# Patient Record
Sex: Female | Born: 1975 | Race: White | Hispanic: No | Marital: Married | Smoking: Former smoker
Health system: Southern US, Community
[De-identification: ages and names within clinical notes are randomized; demographics above are authoritative.]

## PROBLEM LIST (undated history)

## (undated) ENCOUNTER — Emergency Department (HOSPITAL_COMMUNITY): Payer: Medicare Other

## (undated) DIAGNOSIS — I951 Orthostatic hypotension: Secondary | ICD-10-CM

## (undated) DIAGNOSIS — G8929 Other chronic pain: Secondary | ICD-10-CM

## (undated) DIAGNOSIS — G90A Postural orthostatic tachycardia syndrome (POTS): Secondary | ICD-10-CM

## (undated) DIAGNOSIS — K589 Irritable bowel syndrome without diarrhea: Secondary | ICD-10-CM

## (undated) DIAGNOSIS — F419 Anxiety disorder, unspecified: Secondary | ICD-10-CM

## (undated) DIAGNOSIS — IMO0002 Reserved for concepts with insufficient information to code with codable children: Secondary | ICD-10-CM

## (undated) DIAGNOSIS — K219 Gastro-esophageal reflux disease without esophagitis: Secondary | ICD-10-CM

## (undated) DIAGNOSIS — R55 Syncope and collapse: Secondary | ICD-10-CM

## (undated) DIAGNOSIS — Z86010 Personal history of colonic polyps: Secondary | ICD-10-CM

## (undated) DIAGNOSIS — M797 Fibromyalgia: Secondary | ICD-10-CM

## (undated) DIAGNOSIS — K222 Esophageal obstruction: Secondary | ICD-10-CM

## (undated) DIAGNOSIS — G950 Syringomyelia and syringobulbia: Secondary | ICD-10-CM

## (undated) DIAGNOSIS — K648 Other hemorrhoids: Secondary | ICD-10-CM

## (undated) DIAGNOSIS — R000 Tachycardia, unspecified: Secondary | ICD-10-CM

## (undated) DIAGNOSIS — F32A Depression, unspecified: Secondary | ICD-10-CM

## (undated) DIAGNOSIS — R002 Palpitations: Secondary | ICD-10-CM

## (undated) DIAGNOSIS — F329 Major depressive disorder, single episode, unspecified: Secondary | ICD-10-CM

## (undated) DIAGNOSIS — K449 Diaphragmatic hernia without obstruction or gangrene: Secondary | ICD-10-CM

## (undated) DIAGNOSIS — I498 Other specified cardiac arrhythmias: Secondary | ICD-10-CM

## (undated) HISTORY — DX: Depression, unspecified: F32.A

## (undated) HISTORY — DX: Palpitations: R00.2

## (undated) HISTORY — DX: Fibromyalgia: M79.7

## (undated) HISTORY — DX: Diaphragmatic hernia without obstruction or gangrene: K44.9

## (undated) HISTORY — DX: Syringomyelia and syringobulbia: G95.0

## (undated) HISTORY — DX: Gastro-esophageal reflux disease without esophagitis: K21.9

## (undated) HISTORY — DX: Reserved for concepts with insufficient information to code with codable children: IMO0002

## (undated) HISTORY — DX: Major depressive disorder, single episode, unspecified: F32.9

## (undated) HISTORY — DX: Esophageal obstruction: K22.2

## (undated) HISTORY — PX: TONSILLECTOMY: SUR1361

## (undated) HISTORY — DX: Anxiety disorder, unspecified: F41.9

## (undated) HISTORY — DX: Syncope and collapse: R55

## (undated) HISTORY — PX: PARTIAL HYSTERECTOMY: SHX80

## (undated) HISTORY — PX: OTHER SURGICAL HISTORY: SHX169

## (undated) HISTORY — DX: Other chronic pain: G89.29

## (undated) HISTORY — DX: Personal history of colonic polyps: Z86.010

## (undated) HISTORY — DX: Other hemorrhoids: K64.8

## (undated) HISTORY — DX: Irritable bowel syndrome, unspecified: K58.9

---

## 1999-08-01 ENCOUNTER — Ambulatory Visit (HOSPITAL_COMMUNITY): Admission: RE | Admit: 1999-08-01 | Discharge: 1999-08-01 | Payer: Self-pay | Admitting: Family Medicine

## 1999-08-01 ENCOUNTER — Encounter: Payer: Self-pay | Admitting: Family Medicine

## 1999-08-20 ENCOUNTER — Other Ambulatory Visit: Admission: RE | Admit: 1999-08-20 | Discharge: 1999-08-20 | Payer: Self-pay | Admitting: Family Medicine

## 1999-10-06 ENCOUNTER — Ambulatory Visit (HOSPITAL_COMMUNITY): Admission: RE | Admit: 1999-10-06 | Discharge: 1999-10-06 | Payer: Self-pay | Admitting: Family Medicine

## 1999-10-06 ENCOUNTER — Encounter: Payer: Self-pay | Admitting: Family Medicine

## 1999-10-24 ENCOUNTER — Encounter: Admission: RE | Admit: 1999-10-24 | Discharge: 2000-01-22 | Payer: Self-pay | Admitting: Family Medicine

## 2000-01-01 ENCOUNTER — Ambulatory Visit (HOSPITAL_COMMUNITY): Admission: AD | Admit: 2000-01-01 | Discharge: 2000-01-01 | Payer: Self-pay | Admitting: Family Medicine

## 2000-01-01 ENCOUNTER — Encounter: Payer: Self-pay | Admitting: Family Medicine

## 2000-02-26 ENCOUNTER — Inpatient Hospital Stay (HOSPITAL_COMMUNITY): Admission: AD | Admit: 2000-02-26 | Discharge: 2000-02-28 | Payer: Self-pay | Admitting: *Deleted

## 2000-02-26 ENCOUNTER — Encounter (INDEPENDENT_AMBULATORY_CARE_PROVIDER_SITE_OTHER): Payer: Self-pay

## 2000-04-02 ENCOUNTER — Encounter: Payer: Self-pay | Admitting: Family Medicine

## 2000-04-02 ENCOUNTER — Inpatient Hospital Stay (HOSPITAL_COMMUNITY): Admission: RE | Admit: 2000-04-02 | Discharge: 2000-04-04 | Payer: Self-pay | Admitting: Family Medicine

## 2000-04-06 ENCOUNTER — Ambulatory Visit (HOSPITAL_COMMUNITY): Admission: RE | Admit: 2000-04-06 | Discharge: 2000-04-06 | Payer: Self-pay | Admitting: Family Medicine

## 2000-04-06 ENCOUNTER — Encounter: Payer: Self-pay | Admitting: Family Medicine

## 2000-10-04 ENCOUNTER — Other Ambulatory Visit: Admission: RE | Admit: 2000-10-04 | Discharge: 2000-10-04 | Payer: Self-pay | Admitting: Gastroenterology

## 2000-10-04 ENCOUNTER — Encounter (INDEPENDENT_AMBULATORY_CARE_PROVIDER_SITE_OTHER): Payer: Self-pay

## 2000-10-04 DIAGNOSIS — Z860100 Personal history of colon polyps, unspecified: Secondary | ICD-10-CM

## 2000-10-04 HISTORY — DX: Personal history of colon polyps, unspecified: Z86.0100

## 2000-10-04 HISTORY — DX: Personal history of colonic polyps: Z86.010

## 2000-10-06 ENCOUNTER — Other Ambulatory Visit: Admission: RE | Admit: 2000-10-06 | Discharge: 2000-10-06 | Payer: Self-pay | Admitting: Gastroenterology

## 2000-10-06 ENCOUNTER — Encounter (INDEPENDENT_AMBULATORY_CARE_PROVIDER_SITE_OTHER): Payer: Self-pay | Admitting: Specialist

## 2001-05-17 ENCOUNTER — Encounter: Payer: Self-pay | Admitting: Family Medicine

## 2001-05-17 ENCOUNTER — Ambulatory Visit (HOSPITAL_COMMUNITY): Admission: RE | Admit: 2001-05-17 | Discharge: 2001-05-17 | Payer: Self-pay | Admitting: Family Medicine

## 2002-04-21 ENCOUNTER — Other Ambulatory Visit: Admission: RE | Admit: 2002-04-21 | Discharge: 2002-04-21 | Payer: Self-pay | Admitting: Obstetrics & Gynecology

## 2002-06-13 ENCOUNTER — Inpatient Hospital Stay (HOSPITAL_COMMUNITY): Admission: RE | Admit: 2002-06-13 | Discharge: 2002-06-16 | Payer: Self-pay | Admitting: Obstetrics & Gynecology

## 2003-01-20 ENCOUNTER — Emergency Department (HOSPITAL_COMMUNITY): Admission: EM | Admit: 2003-01-20 | Discharge: 2003-01-20 | Payer: Self-pay | Admitting: Emergency Medicine

## 2003-01-31 ENCOUNTER — Ambulatory Visit (HOSPITAL_COMMUNITY): Admission: RE | Admit: 2003-01-31 | Discharge: 2003-01-31 | Payer: Self-pay | Admitting: Obstetrics & Gynecology

## 2003-01-31 ENCOUNTER — Encounter: Payer: Self-pay | Admitting: Obstetrics & Gynecology

## 2003-02-15 ENCOUNTER — Emergency Department (HOSPITAL_COMMUNITY): Admission: EM | Admit: 2003-02-15 | Discharge: 2003-02-15 | Payer: Self-pay | Admitting: Emergency Medicine

## 2003-03-02 ENCOUNTER — Ambulatory Visit (HOSPITAL_COMMUNITY): Admission: RE | Admit: 2003-03-02 | Discharge: 2003-03-02 | Payer: Self-pay | Admitting: Family Medicine

## 2003-03-02 ENCOUNTER — Encounter: Payer: Self-pay | Admitting: Family Medicine

## 2003-11-13 ENCOUNTER — Ambulatory Visit (HOSPITAL_COMMUNITY): Admission: RE | Admit: 2003-11-13 | Discharge: 2003-11-13 | Payer: Self-pay | Admitting: Obstetrics & Gynecology

## 2004-10-16 ENCOUNTER — Ambulatory Visit (HOSPITAL_COMMUNITY): Admission: RE | Admit: 2004-10-16 | Discharge: 2004-10-16 | Payer: Self-pay | Admitting: Internal Medicine

## 2004-10-23 ENCOUNTER — Ambulatory Visit (HOSPITAL_COMMUNITY): Admission: RE | Admit: 2004-10-23 | Discharge: 2004-10-23 | Payer: Self-pay | Admitting: Family Medicine

## 2004-10-31 ENCOUNTER — Ambulatory Visit (HOSPITAL_COMMUNITY): Admission: RE | Admit: 2004-10-31 | Discharge: 2004-10-31 | Payer: Self-pay | Admitting: Neurology

## 2005-01-01 ENCOUNTER — Ambulatory Visit (HOSPITAL_COMMUNITY): Admission: RE | Admit: 2005-01-01 | Discharge: 2005-01-01 | Payer: Self-pay | Admitting: *Deleted

## 2005-01-30 ENCOUNTER — Ambulatory Visit (HOSPITAL_COMMUNITY): Admission: RE | Admit: 2005-01-30 | Discharge: 2005-01-30 | Payer: Self-pay | Admitting: Neurology

## 2006-02-12 ENCOUNTER — Emergency Department (HOSPITAL_COMMUNITY): Admission: EM | Admit: 2006-02-12 | Discharge: 2006-02-12 | Payer: Self-pay | Admitting: Emergency Medicine

## 2006-04-07 ENCOUNTER — Ambulatory Visit (HOSPITAL_COMMUNITY): Admission: RE | Admit: 2006-04-07 | Discharge: 2006-04-07 | Payer: Self-pay | Admitting: Internal Medicine

## 2006-07-17 ENCOUNTER — Emergency Department (HOSPITAL_COMMUNITY): Admission: EM | Admit: 2006-07-17 | Discharge: 2006-07-17 | Payer: Self-pay | Admitting: Emergency Medicine

## 2006-11-02 ENCOUNTER — Ambulatory Visit: Payer: Self-pay | Admitting: Internal Medicine

## 2006-12-01 ENCOUNTER — Ambulatory Visit: Payer: Self-pay | Admitting: Internal Medicine

## 2006-12-02 ENCOUNTER — Ambulatory Visit (HOSPITAL_COMMUNITY): Admission: RE | Admit: 2006-12-02 | Discharge: 2006-12-02 | Payer: Self-pay | Admitting: Internal Medicine

## 2007-01-24 ENCOUNTER — Ambulatory Visit: Payer: Self-pay | Admitting: Physician Assistant

## 2007-02-02 ENCOUNTER — Ambulatory Visit (HOSPITAL_COMMUNITY): Admission: RE | Admit: 2007-02-02 | Discharge: 2007-02-02 | Payer: Self-pay | Admitting: Cardiology

## 2007-02-17 ENCOUNTER — Ambulatory Visit: Payer: Self-pay | Admitting: Cardiology

## 2007-02-17 ENCOUNTER — Encounter: Payer: Self-pay | Admitting: Cardiology

## 2007-02-17 ENCOUNTER — Ambulatory Visit: Payer: Self-pay

## 2007-02-17 LAB — CONVERTED CEMR LAB
ALT: 12 units/L (ref 0–35)
AST: 14 units/L (ref 0–37)
Albumin: 4 g/dL (ref 3.5–5.2)
Alkaline Phosphatase: 28 units/L — ABNORMAL LOW (ref 39–117)
BUN: 13 mg/dL (ref 6–23)
Basophils Absolute: 0 10*3/uL (ref 0.0–0.1)
Basophils Relative: 1.1 % — ABNORMAL HIGH (ref 0.0–1.0)
Bilirubin, Direct: 0.1 mg/dL (ref 0.0–0.3)
CO2: 29 meq/L (ref 19–32)
Calcium: 8.9 mg/dL (ref 8.4–10.5)
Chloride: 106 meq/L (ref 96–112)
Creatinine, Ser: 0.7 mg/dL (ref 0.4–1.2)
Eosinophils Absolute: 0.1 10*3/uL (ref 0.0–0.6)
Eosinophils Relative: 1.6 % (ref 0.0–5.0)
Free T4: 0.9 ng/dL (ref 0.6–1.6)
GFR calc Af Amer: 125 mL/min
GFR calc non Af Amer: 103 mL/min
Glucose, Bld: 78 mg/dL (ref 70–99)
HCT: 29.6 % — ABNORMAL LOW (ref 36.0–46.0)
Hemoglobin: 10.5 g/dL — ABNORMAL LOW (ref 12.0–15.0)
Lymphocytes Relative: 63.6 % — ABNORMAL HIGH (ref 12.0–46.0)
MCHC: 35.6 g/dL (ref 30.0–36.0)
MCV: 95.5 fL (ref 78.0–100.0)
Magnesium: 2.3 mg/dL (ref 1.5–2.5)
Monocytes Absolute: 0.3 10*3/uL (ref 0.2–0.7)
Monocytes Relative: 6.5 % (ref 3.0–11.0)
Neutro Abs: 1.2 10*3/uL — ABNORMAL LOW (ref 1.4–7.7)
Neutrophils Relative %: 27.2 % — ABNORMAL LOW (ref 43.0–77.0)
Platelets: 175 10*3/uL (ref 150–400)
Potassium: 3.1 meq/L — ABNORMAL LOW (ref 3.5–5.1)
RBC: 3.1 M/uL — ABNORMAL LOW (ref 3.87–5.11)
RDW: 12.1 % (ref 11.5–14.6)
Sodium: 140 meq/L (ref 135–145)
T3, Free: 2.6 pg/mL (ref 2.3–4.2)
TSH: 1.64 microintl units/mL (ref 0.35–5.50)
Total Bilirubin: 0.6 mg/dL (ref 0.3–1.2)
Total Protein: 6.2 g/dL (ref 6.0–8.3)
WBC: 4.3 10*3/uL — ABNORMAL LOW (ref 4.5–10.5)

## 2007-02-25 ENCOUNTER — Ambulatory Visit: Payer: Self-pay | Admitting: Physician Assistant

## 2007-05-25 ENCOUNTER — Encounter
Admission: RE | Admit: 2007-05-25 | Discharge: 2007-08-23 | Payer: Self-pay | Admitting: Physical Medicine & Rehabilitation

## 2007-05-25 ENCOUNTER — Ambulatory Visit: Payer: Self-pay | Admitting: Physical Medicine & Rehabilitation

## 2007-07-19 ENCOUNTER — Ambulatory Visit: Payer: Self-pay | Admitting: Physical Medicine & Rehabilitation

## 2007-09-28 ENCOUNTER — Ambulatory Visit (HOSPITAL_COMMUNITY): Admission: RE | Admit: 2007-09-28 | Discharge: 2007-09-28 | Payer: Self-pay | Admitting: Family Medicine

## 2007-10-12 ENCOUNTER — Ambulatory Visit: Payer: Self-pay | Admitting: Physical Medicine & Rehabilitation

## 2007-10-12 ENCOUNTER — Encounter
Admission: RE | Admit: 2007-10-12 | Discharge: 2007-10-13 | Payer: Self-pay | Admitting: Physical Medicine & Rehabilitation

## 2008-01-03 ENCOUNTER — Ambulatory Visit (HOSPITAL_COMMUNITY): Admission: RE | Admit: 2008-01-03 | Discharge: 2008-01-03 | Payer: Self-pay | Admitting: Neurosurgery

## 2008-01-05 ENCOUNTER — Ambulatory Visit: Payer: Self-pay | Admitting: Physical Medicine & Rehabilitation

## 2008-01-05 ENCOUNTER — Telehealth: Payer: Self-pay | Admitting: Gastroenterology

## 2008-01-05 ENCOUNTER — Encounter
Admission: RE | Admit: 2008-01-05 | Discharge: 2008-01-05 | Payer: Self-pay | Admitting: Physical Medicine & Rehabilitation

## 2008-01-23 ENCOUNTER — Telehealth (INDEPENDENT_AMBULATORY_CARE_PROVIDER_SITE_OTHER): Payer: Self-pay | Admitting: *Deleted

## 2008-03-30 ENCOUNTER — Encounter
Admission: RE | Admit: 2008-03-30 | Discharge: 2008-04-02 | Payer: Self-pay | Admitting: Physical Medicine & Rehabilitation

## 2008-04-02 ENCOUNTER — Ambulatory Visit: Payer: Self-pay | Admitting: Physical Medicine & Rehabilitation

## 2008-04-25 ENCOUNTER — Ambulatory Visit (HOSPITAL_COMMUNITY): Admission: RE | Admit: 2008-04-25 | Discharge: 2008-04-25 | Payer: Self-pay | Admitting: Obstetrics and Gynecology

## 2008-04-26 ENCOUNTER — Ambulatory Visit: Payer: Self-pay | Admitting: Internal Medicine

## 2008-05-10 ENCOUNTER — Encounter: Payer: Self-pay | Admitting: Internal Medicine

## 2008-05-10 ENCOUNTER — Ambulatory Visit (HOSPITAL_COMMUNITY): Admission: RE | Admit: 2008-05-10 | Discharge: 2008-05-10 | Payer: Self-pay | Admitting: Internal Medicine

## 2008-05-10 ENCOUNTER — Ambulatory Visit: Payer: Self-pay | Admitting: Internal Medicine

## 2008-05-14 ENCOUNTER — Ambulatory Visit (HOSPITAL_COMMUNITY): Admission: RE | Admit: 2008-05-14 | Discharge: 2008-05-14 | Payer: Self-pay | Admitting: Obstetrics and Gynecology

## 2008-06-22 ENCOUNTER — Encounter
Admission: RE | Admit: 2008-06-22 | Discharge: 2008-06-25 | Payer: Self-pay | Admitting: Physical Medicine & Rehabilitation

## 2008-06-25 ENCOUNTER — Ambulatory Visit: Payer: Self-pay | Admitting: Physical Medicine & Rehabilitation

## 2008-08-25 ENCOUNTER — Emergency Department (HOSPITAL_COMMUNITY): Admission: EM | Admit: 2008-08-25 | Discharge: 2008-08-25 | Payer: Self-pay | Admitting: *Deleted

## 2008-08-25 ENCOUNTER — Observation Stay (HOSPITAL_COMMUNITY): Admission: EM | Admit: 2008-08-25 | Discharge: 2008-08-26 | Payer: Self-pay | Admitting: Emergency Medicine

## 2008-09-14 ENCOUNTER — Encounter
Admission: RE | Admit: 2008-09-14 | Discharge: 2008-12-13 | Payer: Self-pay | Admitting: Physical Medicine & Rehabilitation

## 2008-10-02 ENCOUNTER — Ambulatory Visit: Payer: Self-pay | Admitting: Physical Medicine & Rehabilitation

## 2008-11-09 ENCOUNTER — Ambulatory Visit (HOSPITAL_COMMUNITY): Admission: RE | Admit: 2008-11-09 | Discharge: 2008-11-09 | Payer: Self-pay | Admitting: Internal Medicine

## 2008-11-21 DIAGNOSIS — F411 Generalized anxiety disorder: Secondary | ICD-10-CM | POA: Insufficient documentation

## 2008-11-21 DIAGNOSIS — F3289 Other specified depressive episodes: Secondary | ICD-10-CM | POA: Insufficient documentation

## 2008-11-21 DIAGNOSIS — F17200 Nicotine dependence, unspecified, uncomplicated: Secondary | ICD-10-CM | POA: Insufficient documentation

## 2008-11-21 DIAGNOSIS — F329 Major depressive disorder, single episode, unspecified: Secondary | ICD-10-CM

## 2008-11-26 ENCOUNTER — Encounter (HOSPITAL_COMMUNITY): Admission: RE | Admit: 2008-11-26 | Discharge: 2008-12-26 | Payer: Self-pay | Admitting: Oncology

## 2008-11-26 ENCOUNTER — Ambulatory Visit (HOSPITAL_COMMUNITY): Payer: Self-pay | Admitting: Oncology

## 2008-11-27 ENCOUNTER — Ambulatory Visit: Payer: Self-pay | Admitting: Physical Medicine & Rehabilitation

## 2008-12-07 ENCOUNTER — Ambulatory Visit: Payer: Self-pay | Admitting: Cardiology

## 2008-12-10 ENCOUNTER — Encounter: Payer: Self-pay | Admitting: Cardiology

## 2008-12-10 ENCOUNTER — Ambulatory Visit (HOSPITAL_COMMUNITY): Admission: RE | Admit: 2008-12-10 | Discharge: 2008-12-10 | Payer: Self-pay | Admitting: Cardiology

## 2008-12-10 ENCOUNTER — Ambulatory Visit: Payer: Self-pay | Admitting: Cardiology

## 2009-01-02 ENCOUNTER — Encounter: Payer: Self-pay | Admitting: Gastroenterology

## 2009-01-23 ENCOUNTER — Encounter
Admission: RE | Admit: 2009-01-23 | Discharge: 2009-04-23 | Payer: Self-pay | Admitting: Physical Medicine & Rehabilitation

## 2009-02-18 ENCOUNTER — Ambulatory Visit: Payer: Self-pay | Admitting: Physical Medicine & Rehabilitation

## 2009-05-13 ENCOUNTER — Encounter
Admission: RE | Admit: 2009-05-13 | Discharge: 2009-05-17 | Payer: Self-pay | Admitting: Physical Medicine & Rehabilitation

## 2009-05-17 ENCOUNTER — Ambulatory Visit: Payer: Self-pay | Admitting: Physical Medicine & Rehabilitation

## 2009-07-01 ENCOUNTER — Ambulatory Visit (HOSPITAL_COMMUNITY): Admission: RE | Admit: 2009-07-01 | Discharge: 2009-07-01 | Payer: Self-pay | Admitting: Cardiology

## 2009-07-05 ENCOUNTER — Ambulatory Visit (HOSPITAL_COMMUNITY): Admission: RE | Admit: 2009-07-05 | Discharge: 2009-07-05 | Payer: Self-pay | Admitting: Cardiology

## 2009-08-15 ENCOUNTER — Encounter
Admission: RE | Admit: 2009-08-15 | Discharge: 2009-11-13 | Payer: Self-pay | Admitting: Physical Medicine & Rehabilitation

## 2009-08-16 ENCOUNTER — Ambulatory Visit: Payer: Self-pay | Admitting: Physical Medicine & Rehabilitation

## 2009-09-02 ENCOUNTER — Encounter: Payer: Self-pay | Admitting: Gastroenterology

## 2009-09-25 ENCOUNTER — Ambulatory Visit (HOSPITAL_COMMUNITY): Admission: RE | Admit: 2009-09-25 | Discharge: 2009-09-25 | Payer: Self-pay | Admitting: Internal Medicine

## 2009-11-05 ENCOUNTER — Encounter
Admission: RE | Admit: 2009-11-05 | Discharge: 2009-11-08 | Payer: Self-pay | Admitting: Physical Medicine & Rehabilitation

## 2009-11-08 ENCOUNTER — Ambulatory Visit: Payer: Self-pay | Admitting: Physical Medicine & Rehabilitation

## 2009-12-02 ENCOUNTER — Ambulatory Visit (HOSPITAL_COMMUNITY): Admission: RE | Admit: 2009-12-02 | Discharge: 2009-12-02 | Payer: Self-pay | Admitting: Endocrinology

## 2010-01-24 ENCOUNTER — Encounter
Admission: RE | Admit: 2010-01-24 | Discharge: 2010-01-30 | Payer: Self-pay | Admitting: Physical Medicine & Rehabilitation

## 2010-01-30 ENCOUNTER — Ambulatory Visit: Payer: Self-pay | Admitting: Physical Medicine & Rehabilitation

## 2010-04-22 ENCOUNTER — Encounter
Admission: RE | Admit: 2010-04-22 | Discharge: 2010-05-13 | Payer: Self-pay | Source: Home / Self Care | Attending: Physical Medicine & Rehabilitation | Admitting: Physical Medicine & Rehabilitation

## 2010-04-23 ENCOUNTER — Ambulatory Visit: Payer: Self-pay | Admitting: Cardiology

## 2010-04-23 DIAGNOSIS — R55 Syncope and collapse: Secondary | ICD-10-CM | POA: Insufficient documentation

## 2010-04-23 DIAGNOSIS — R002 Palpitations: Secondary | ICD-10-CM | POA: Insufficient documentation

## 2010-04-29 ENCOUNTER — Ambulatory Visit (HOSPITAL_COMMUNITY): Admission: RE | Admit: 2010-04-29 | Discharge: 2010-04-29 | Payer: Self-pay | Admitting: Cardiology

## 2010-05-02 ENCOUNTER — Ambulatory Visit: Payer: Self-pay | Admitting: Cardiology

## 2010-05-08 ENCOUNTER — Telehealth (INDEPENDENT_AMBULATORY_CARE_PROVIDER_SITE_OTHER): Payer: Self-pay

## 2010-05-09 ENCOUNTER — Encounter: Payer: Self-pay | Admitting: Cardiology

## 2010-05-13 ENCOUNTER — Ambulatory Visit: Payer: Self-pay | Admitting: Physical Medicine & Rehabilitation

## 2010-05-21 ENCOUNTER — Encounter (INDEPENDENT_AMBULATORY_CARE_PROVIDER_SITE_OTHER): Payer: Self-pay | Admitting: *Deleted

## 2010-06-17 ENCOUNTER — Ambulatory Visit (HOSPITAL_COMMUNITY)
Admission: RE | Admit: 2010-06-17 | Discharge: 2010-06-17 | Payer: Self-pay | Source: Home / Self Care | Admitting: Family Medicine

## 2010-08-04 ENCOUNTER — Encounter
Admission: RE | Admit: 2010-08-04 | Discharge: 2010-09-02 | Payer: Self-pay | Source: Home / Self Care | Attending: Physical Medicine & Rehabilitation | Admitting: Physical Medicine & Rehabilitation

## 2010-08-05 ENCOUNTER — Ambulatory Visit: Admit: 2010-08-05 | Payer: Self-pay | Admitting: Physical Medicine & Rehabilitation

## 2010-08-11 ENCOUNTER — Encounter (INDEPENDENT_AMBULATORY_CARE_PROVIDER_SITE_OTHER): Payer: Self-pay | Admitting: *Deleted

## 2010-08-12 ENCOUNTER — Encounter
Admission: RE | Admit: 2010-08-12 | Discharge: 2010-09-02 | Payer: Self-pay | Source: Home / Self Care | Attending: Physical Medicine & Rehabilitation | Admitting: Physical Medicine & Rehabilitation

## 2010-08-14 ENCOUNTER — Ambulatory Visit
Admission: RE | Admit: 2010-08-14 | Discharge: 2010-08-14 | Payer: Self-pay | Source: Home / Self Care | Attending: Physical Medicine & Rehabilitation | Admitting: Physical Medicine & Rehabilitation

## 2010-08-23 ENCOUNTER — Encounter: Payer: Self-pay | Admitting: Gastroenterology

## 2010-08-24 ENCOUNTER — Encounter: Payer: Self-pay | Admitting: Family Medicine

## 2010-08-29 ENCOUNTER — Ambulatory Visit
Admission: RE | Admit: 2010-08-29 | Discharge: 2010-08-29 | Payer: Self-pay | Source: Home / Self Care | Attending: Urology | Admitting: Urology

## 2010-09-02 ENCOUNTER — Ambulatory Visit (HOSPITAL_COMMUNITY)
Admission: RE | Admit: 2010-09-02 | Discharge: 2010-09-02 | Payer: Self-pay | Source: Home / Self Care | Attending: Urology | Admitting: Urology

## 2010-09-02 NOTE — Letter (Signed)
Summary: Appointment - Missed  [facility]  [address], [place] [address]   Phone: [phone]  Fax: [phone]     May 21, 2010 MRN: [id number]   [name] [address] [place], [place]  [address]   Dear [name],  Our records indicate you missed your appointment on       05/20/10                 with Dr.   Diona Browner    .                                    It is very important that we reach you to reschedule this appointment. We look forward to participating in your health care needs. Please contact us at the number listed above at your earliest convenience to reschedule this appointment.     Sincerely,    Glass blower/designer

## 2010-09-02 NOTE — Medication Information (Signed)
Summary: RX Folder  RX Folder   Imported By: Diana Eves 09/02/2009 16:31:10  _____________________________________________________________________  External Attachment:    Type:   Image     Comment:   External Document  Appended Document: RX Folder Denied.   Appended Document: RX Folder pharmacy aware

## 2010-09-02 NOTE — Progress Notes (Signed)
Summary: holter results  Phone Note Call from Patient Call back at Home Phone [phone]   Caller: pt Reason for Call: Lab or Test Results, Referral Summary of Call: pt would like results of holter monitor. Initial call taken by: Faythe Ghee,  May 08, 2010 1:20 PM  Follow-up for Phone Call        Voice mailbox is full, will continue to call. Follow-up by: Larita Fife Via LPN,  May 08, 2010 1:48 PM  Additional Follow-up for Phone Call Additional follow up Details #1::        Voice mailbox is full, will send results letter. Additional Follow-up by: Larita Fife Via LPN,  May 09, 2010 10:39 AM

## 2010-09-02 NOTE — Letter (Signed)
Summary: [place] [facility], agricultural at [facility]  [address] [place] [address]   Phone: [phone]  Fax: [phone]      May 09, 2010 MRN: [id number]   [name] [address] [place], [place]  [address]   Dear Ms. [name],  Your test ordered by Selena Batten has been reviewed by your physician (or physician assistant) and was found to be normal or stable. Your physician (or physician assistant) felt no changes were needed at this time.  ____ Echocardiogram  ____ Cardiac Stress Test  ____ Lab Work  ____ Peripheral vascular study of arms, legs or neck  ____ CT scan or X-ray  ____ Lung or Breathing test  __X__ Other: Holter Monitor   Please continue on current medical treatment.  Thank you.   Nona Dell, MD, F.A.C.C

## 2010-09-02 NOTE — Assessment & Plan Note (Signed)
Summary: 35yr/rm   Visit Type:  Follow-up Primary Provider:  Dr. Elfredia Nevins   History of Present Illness: 35 year old woman presents for followup. I saw her in consultation back in May 2010. Followup echocardiogram was obtained last year, reviewed below, not indicating definitively a bicuspid valve. As noted in the previous office visit, she has a history of recurrent palpitations and syncope, with previous extensive testing and overall reassuring results.  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  I also note that she underwent a tilt table study under the direction of Dr. Lynnea Ferrier with [facility] heart [facility] vascular in December 2010. This study was reported normal.  She indicates continued symptoms including a sensation of orthostatic dizziness with increased heart rate, palpitations, chest discomfort and shortness of breath associated with palpitations, fatigue, recurrent headaches and nausea, difficulty concentrating at times, and intermittent fainting spells. It was in fact an episode of syncope that apparently prompted the tilt table test noted above. At that time Dr. Lynnea Ferrier mentioned a possible implantable loop recorder, although she did not want to proceed at that time.       Preventive Screening-Counseling & Management  Alcohol-Tobacco     Smoking Status: current  Current Medications (verified): 1)  Nexium 40 Mg Cpdr (Esomeprazole Magnesium) .... One By Mouth Two Times A Day For Acid Reflux 2)  Hydrocodone-Acetaminophen 7.5-500 Mg Tabs (Hydrocodone-Acetaminophen) .... Four Times  A Day 3)  Fioricet 50-325-40 Mg Tabs (Butalbital-Apap-Caffeine) .... As Needed 4)  Clonazepam 2 Mg Tabs (Clonazepam) .... Three Times A Day 5)  Prozac 40 Mg Caps (Fluoxetine Hcl) .... Take 1 Tab Daily 6)  Fludrocortisone Acetate 0.1 Mg Tabs (Fludrocortisone Acetate) .... Take 1 Tablet By Mouth Once Daily  Allergies (verified): No Known Drug Allergies  Past History:  Social History: Last updated: 04/23/2010 Disabled  Tobacco Use - Yes.  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  Alcohol Use - no Smoking Status:  current  Review of Systems  The patient denies anorexia, fever, weight gain, peripheral edema, prolonged cough, hemoptysis, and severe indigestion/heartburn.         Otherwise reviewed and negative except as outlined in history of present illness.  Vital Signs:  Patient profile:   35 year old female Height:      66 inches Weight:      112 pounds BMI:     18.14 Pulse rate:   111 / minute BP sitting:   100 / 72  (right arm)  Vitals Entered By: Dreama Saa, CNA (April 23, 2010 3:06 PM)  Physical Exam  Additional Exam:  Thin woman, no acute distress. HEENT: Conjunctiva and lids normal, oropharynx with poor dentition. Neck: Supple, no elevated JVP or bruits, no thyromegaly or thyroid tenderness. Lungs: Clear to auscultation, nonlabored. Cardiac: Regular rate and rhythm, no S3 is  apparent, no pericardial rub. Abdomen: Soft,  nontender, bowel sounds present. Extremities: No pitting edema, distal pulses 2+. Skin: Warm and dry. Musculoskeletal: No kyphosis. Neuropsychiatric: Patient alert and oriented x3, somewhat anxious.   Echocardiogram  Procedure date:  12/10/2008  Findings:      Left ventricle: The cavity size was normal. Systolic function was   normal. Wall motion was normal; there were no regional wall motion   abnormalities.   Aortic valve: Trileaflet; slightly thickened leaflets. Mobility was   not restricted. Doppler: Transvalvular velocity was within the   normal range. There was no stenosis. No regurgitation.  EKG  Procedure date:  04/23/2010  Findings:      Sinus tachycardia at 114 beats per minute, possible biatrial enlargement, nonspecific T wave changes. QT Interval 460 ms corrected.  Impression & Recommendations:  Problem # 1:  SYNCOPE AND COLLAPSE (ICD-780.2)  Recurrent spells over the last several years as already detailed. Patient has undergone extensive testing, reassuring overall. So far no definitive dysrhythmia has been uncovered. Reportedly tilt table testing done in December 2010 was also normal. The patient could certainly have neurocardiogenic hypersensitivity, or perhaps postural orthostatic tachycardia syndrome. She has not had any recent syncopal events, but reports an increase in palpitations. Plan at this point is to try Florinef 0.1 mg p.o. daily to see if this helps at all with symptoms. Further cardiac monitoring will also be undertaken. Followup visit scheduled over the next 3-4 weeks.  Orders: Holter Monitor (Holter Monitor)  Problem # 2:  PALPITATIONS (ICD-785.1)  Reportedly worsening. Plan followup Holter monitor for 48 hours. Patient indicating daily symptoms.  Patient Instructions: 1)  Your physician recommends that you schedule a follow-up appointment in: 3 to 4 weeks 2)  Your physician has recommended you make the following change in your medication: Start  taking Fludrocortisone 0.1mg  by mouth once daily  3)  Your physician has recommended that you wear a holter monitor.  Holter monitors are medical devices that record the heart's electrical activity. Doctors most often use these monitors to diagnose arrhythmias. Arrhythmias are problems with the speed or rhythm of the heartbeat. The monitor is a small, portable device. You can wear one while you do your normal daily activities. This is usually used to diagnose what is causing palpitations/syncope (passing out). Prescriptions: FLUDROCORTISONE ACETATE 0.1 MG TABS (FLUDROCORTISONE ACETATE) Take 1 tablet by mouth once daily  #30 x 3   Entered by:   Larita Fife Via LPN   Authorized by:   Loreli Slot, MD, [facility]   Signed by:   Larita Fife Via LPN on 82/95/6213   Method used:   Electronically to        [facility]* (retail)       [address]       [place], [place]  [address]       Ph: [phone]       Fax: [phone]   RxID:   [id number]

## 2010-09-04 ENCOUNTER — Ambulatory Visit: Payer: Self-pay | Admitting: Physical Medicine & Rehabilitation

## 2010-09-04 ENCOUNTER — Ambulatory Visit: Payer: Self-pay

## 2010-09-04 NOTE — Letter (Signed)
Summary: Unable to Reach, Consult Scheduled  [facility]  [address]   [place], [place] [address]   Phone: [phone]  Fax: [phone]    08/11/2010  [name] [address] [place], [place]  [address] [birth year]   Dear Ms. [name],   We have been unable to reach you by phone.    At the recommendation of DR [facility]  we have been asked to schedule you a consult with DR Jena Gauss for ABDOMINAL PAIN AND BLOATING.     Please call our office at [phone].     Thank you,    Diana Eves  [facility] R. Roetta Sessions, M.D.    Jonette Eva, M.D. Lorenza Burton, FNP-BC    Tana Coast, PA-C Phone: [phone]    Fax: [phone]

## 2010-09-05 ENCOUNTER — Ambulatory Visit: Payer: MEDICARE | Admitting: Urology

## 2010-09-05 DIAGNOSIS — R310 Gross hematuria: Secondary | ICD-10-CM

## 2010-09-05 DIAGNOSIS — R1900 Intra-abdominal and pelvic swelling, mass and lump, unspecified site: Secondary | ICD-10-CM

## 2010-09-12 ENCOUNTER — Ambulatory Visit: Payer: MEDICARE | Admitting: Urology

## 2010-10-19 ENCOUNTER — Emergency Department (HOSPITAL_COMMUNITY)
Admission: EM | Admit: 2010-10-19 | Discharge: 2010-10-19 | Disposition: A | Discharge status: Home / Self Care | Payer: Medicare Other | Attending: Emergency Medicine | Admitting: Emergency Medicine

## 2010-10-19 DIAGNOSIS — Y92009 Unspecified place in unspecified non-institutional (private) residence as the place of occurrence of the external cause: Secondary | ICD-10-CM | POA: Insufficient documentation

## 2010-10-19 DIAGNOSIS — M549 Dorsalgia, unspecified: Secondary | ICD-10-CM | POA: Insufficient documentation

## 2010-10-19 DIAGNOSIS — R011 Cardiac murmur, unspecified: Secondary | ICD-10-CM | POA: Insufficient documentation

## 2010-10-19 DIAGNOSIS — IMO0001 Reserved for inherently not codable concepts without codable children: Secondary | ICD-10-CM | POA: Insufficient documentation

## 2010-10-19 DIAGNOSIS — X500XXA Overexertion from strenuous movement or load, initial encounter: Secondary | ICD-10-CM | POA: Insufficient documentation

## 2010-10-19 DIAGNOSIS — K219 Gastro-esophageal reflux disease without esophagitis: Secondary | ICD-10-CM | POA: Insufficient documentation

## 2010-10-23 ENCOUNTER — Encounter: Payer: Self-pay | Admitting: Gastroenterology

## 2010-10-28 ENCOUNTER — Telehealth: Payer: Self-pay | Admitting: Gastroenterology

## 2010-10-28 NOTE — Telephone Encounter (Signed)
Left a message for patient to call me back.

## 2010-10-30 NOTE — Letter (Signed)
Summary: Colonoscopy Date Change Letter   Gastroenterology  [address] [place], [place] [address]   Phone: [phone]  Fax: [phone]      October 23, 2010 MRN: [id number]   [name] [address] [place], [place]  [address]   Dear Ms. BORAN,   Previously you were recommended to have a repeat colonoscopy around this time. Your chart was recently reviewed by Dr. Claudette Head of [facility]. Follow up colonoscopy is now recommended in March 2025. This revised recommendation is based on current, nationally recognized guidelines for colorectal cancer screening and polyp surveillance. These guidelines are endorsed by the American Cancer Society, [employer] on Colorectal Cancer as well as numerous other major medical organizations.  Please understand that our recommendation assumes that you do not have any new symptoms such as bleeding, a change in bowel habits, anemia, or significant abdominal discomfort. If you do have any concerning GI symptoms or want to discuss the guideline recommendations, please call to arrange an office visit at your earliest convenience. Otherwise we will keep you in our reminder system and contact you 1-2 months prior to the date listed above to schedule your next colonoscopy.  Thank you,  Judie Petit T. Russella Dar, M.D.  [facility] [phone]

## 2010-11-03 NOTE — Telephone Encounter (Signed)
Left message for patient to call me back. 

## 2010-11-04 NOTE — Telephone Encounter (Signed)
Left message for patient to call me back. 

## 2010-11-05 NOTE — Telephone Encounter (Signed)
Unable to reach patient at above number after multiple attempts. Patient has not returned the calls.

## 2010-11-11 ENCOUNTER — Ambulatory Visit: Payer: Medicare Other | Admitting: Physical Medicine & Rehabilitation

## 2010-11-11 ENCOUNTER — Ambulatory Visit: Payer: Self-pay

## 2010-11-11 ENCOUNTER — Encounter: Payer: Medicare Other | Admitting: Neurosurgery

## 2010-11-11 ENCOUNTER — Encounter: Payer: Medicare Other | Attending: Physical Medicine & Rehabilitation

## 2010-11-11 DIAGNOSIS — R209 Unspecified disturbances of skin sensation: Secondary | ICD-10-CM | POA: Insufficient documentation

## 2010-11-11 DIAGNOSIS — IMO0001 Reserved for inherently not codable concepts without codable children: Secondary | ICD-10-CM | POA: Insufficient documentation

## 2010-11-11 DIAGNOSIS — G894 Chronic pain syndrome: Secondary | ICD-10-CM | POA: Insufficient documentation

## 2010-11-11 DIAGNOSIS — R259 Unspecified abnormal involuntary movements: Secondary | ICD-10-CM | POA: Insufficient documentation

## 2010-11-11 DIAGNOSIS — R5381 Other malaise: Secondary | ICD-10-CM | POA: Insufficient documentation

## 2010-11-11 DIAGNOSIS — M48061 Spinal stenosis, lumbar region without neurogenic claudication: Secondary | ICD-10-CM

## 2010-11-11 DIAGNOSIS — M545 Low back pain, unspecified: Secondary | ICD-10-CM | POA: Insufficient documentation

## 2010-11-11 DIAGNOSIS — F341 Dysthymic disorder: Secondary | ICD-10-CM | POA: Insufficient documentation

## 2010-11-11 DIAGNOSIS — M5430 Sciatica, unspecified side: Secondary | ICD-10-CM

## 2010-11-11 DIAGNOSIS — R5383 Other fatigue: Secondary | ICD-10-CM | POA: Insufficient documentation

## 2010-11-11 DIAGNOSIS — Z79899 Other long term (current) drug therapy: Secondary | ICD-10-CM | POA: Insufficient documentation

## 2010-11-12 LAB — SEDIMENTATION RATE: Sed Rate: 0 mm/hr (ref 0–22)

## 2010-11-12 LAB — DIFFERENTIAL
Basophils Absolute: 0 10*3/uL (ref 0.0–0.1)
Basophils Relative: 1 % (ref 0–1)
Eosinophils Absolute: 0.1 10*3/uL (ref 0.0–0.7)
Eosinophils Relative: 2 % (ref 0–5)
Lymphocytes Relative: 43 % (ref 12–46)
Lymphs Abs: 2.1 10*3/uL (ref 0.7–4.0)
Monocytes Absolute: 0.3 10*3/uL (ref 0.1–1.0)
Monocytes Relative: 6 % (ref 3–12)
Neutro Abs: 2.3 10*3/uL (ref 1.7–7.7)
Neutrophils Relative %: 47 % (ref 43–77)

## 2010-11-12 LAB — CBC
HCT: 36.4 % (ref 36.0–46.0)
Hemoglobin: 12.7 g/dL (ref 12.0–15.0)
MCHC: 35 g/dL (ref 30.0–36.0)
MCV: 99.3 fL (ref 78.0–100.0)
Platelets: 198 10*3/uL (ref 150–400)
RBC: 3.66 MIL/uL — ABNORMAL LOW (ref 3.87–5.11)
RDW: 12.8 % (ref 11.5–15.5)
WBC: 4.8 10*3/uL (ref 4.0–10.5)

## 2010-11-12 LAB — RETICULOCYTES
RBC.: 3.66 MIL/uL — ABNORMAL LOW (ref 3.87–5.11)
Retic Count, Absolute: 25.6 10*3/uL (ref 19.0–186.0)
Retic Ct Pct: 0.7 % (ref 0.4–3.1)

## 2010-11-12 LAB — IRON AND TIBC
Iron: 101 ug/dL (ref 42–135)
Saturation Ratios: 31 % (ref 20–55)
TIBC: 328 ug/dL (ref 250–470)
UIBC: 227 ug/dL

## 2010-11-12 LAB — FERRITIN: Ferritin: 25 ng/mL (ref 10–291)

## 2010-11-17 LAB — URINALYSIS, ROUTINE W REFLEX MICROSCOPIC
Bilirubin Urine: NEGATIVE
Glucose, UA: NEGATIVE mg/dL
Hgb urine dipstick: NEGATIVE
Ketones, ur: NEGATIVE mg/dL
Nitrite: NEGATIVE
Protein, ur: NEGATIVE mg/dL
Specific Gravity, Urine: 1.032 — ABNORMAL HIGH (ref 1.005–1.030)
Urobilinogen, UA: 0.2 mg/dL (ref 0.0–1.0)
pH: 6 (ref 5.0–8.0)

## 2010-11-17 LAB — CBC
HCT: 34.8 % — ABNORMAL LOW (ref 36.0–46.0)
HCT: 31 % — ABNORMAL LOW (ref 36.0–46.0)
Hemoglobin: 11.8 g/dL — ABNORMAL LOW (ref 12.0–15.0)
Hemoglobin: 10.5 g/dL — ABNORMAL LOW (ref 12.0–15.0)
MCHC: 34 g/dL (ref 30.0–36.0)
MCHC: 33.9 g/dL (ref 30.0–36.0)
MCV: 99.2 fL (ref 78.0–100.0)
MCV: 99 fL (ref 78.0–100.0)
Platelets: 199 10*3/uL (ref 150–400)
Platelets: 169 10*3/uL (ref 150–400)
RBC: 3.51 MIL/uL — ABNORMAL LOW (ref 3.87–5.11)
RBC: 3.13 MIL/uL — ABNORMAL LOW (ref 3.87–5.11)
RDW: 12.9 % (ref 11.5–15.5)
RDW: 12.8 % (ref 11.5–15.5)
WBC: 6.3 10*3/uL (ref 4.0–10.5)
WBC: 4.4 10*3/uL (ref 4.0–10.5)

## 2010-11-17 LAB — PROTIME-INR
INR: 1.1 (ref 0.00–1.49)
Prothrombin Time: 14.3 seconds (ref 11.6–15.2)

## 2010-11-17 LAB — CARDIAC PANEL(CRET KIN+CKTOT+MB+TROPI)
CK, MB: 0.6 ng/mL (ref 0.3–4.0)
CK, MB: 0.7 ng/mL (ref 0.3–4.0)
Relative Index: INVALID (ref 0.0–2.5)
Relative Index: INVALID (ref 0.0–2.5)
Total CK: 69 U/L (ref 7–177)
Total CK: 83 U/L (ref 7–177)
Troponin I: 0.02 ng/mL (ref 0.00–0.06)
Troponin I: <0.01 ng/mL (ref 0.00–0.06)

## 2010-11-17 LAB — RAPID URINE DRUG SCREEN, HOSP PERFORMED
Amphetamines: POSITIVE — AB
Barbiturates: POSITIVE — AB
Benzodiazepines: POSITIVE — AB
Cocaine: NOT DETECTED
Opiates: POSITIVE — AB
Tetrahydrocannabinol: NOT DETECTED

## 2010-11-17 LAB — COMPREHENSIVE METABOLIC PANEL
ALT: 16 U/L (ref 0–35)
AST: 14 U/L (ref 0–37)
Albumin: 3.8 g/dL (ref 3.5–5.2)
Alkaline Phosphatase: 40 U/L (ref 39–117)
BUN: 12 mg/dL (ref 6–23)
CO2: 27 mEq/L (ref 19–32)
Calcium: 8.3 mg/dL — ABNORMAL LOW (ref 8.4–10.5)
Chloride: 111 mEq/L (ref 96–112)
Creatinine, Ser: 0.64 mg/dL (ref 0.4–1.2)
GFR calc Af Amer: >60 mL/min (ref >60)
GFR calc non Af Amer: >60 mL/min (ref >60)
Glucose, Bld: 93 mg/dL (ref 70–99)
Potassium: 3.9 mEq/L (ref 3.5–5.1)
Sodium: 141 mEq/L (ref 135–145)
Total Bilirubin: 0.6 mg/dL (ref 0.3–1.2)
Total Protein: 5.8 g/dL — ABNORMAL LOW (ref 6.0–8.3)

## 2010-11-17 LAB — DIFFERENTIAL
Basophils Absolute: 0 10*3/uL (ref 0.0–0.1)
Basophils Relative: 1 % (ref 0–1)
Eosinophils Absolute: 0.1 10*3/uL (ref 0.0–0.7)
Eosinophils Relative: 2 % (ref 0–5)
Lymphocytes Relative: 55 % — ABNORMAL HIGH (ref 12–46)
Lymphs Abs: 2.4 10*3/uL (ref 0.7–4.0)
Monocytes Absolute: 0.3 10*3/uL (ref 0.1–1.0)
Monocytes Relative: 7 % (ref 3–12)
Neutro Abs: 1.6 10*3/uL — ABNORMAL LOW (ref 1.7–7.7)
Neutrophils Relative %: 36 % — ABNORMAL LOW (ref 43–77)

## 2010-11-17 LAB — POCT CARDIAC MARKERS
CKMB, poc: <1 ng/mL — ABNORMAL LOW (ref 1.0–8.0)
CKMB, poc: <1 ng/mL — ABNORMAL LOW (ref 1.0–8.0)
Myoglobin, poc: 28.7 ng/mL (ref 12–200)
Myoglobin, poc: 36.1 ng/mL (ref 12–200)
Troponin i, poc: <0.05 ng/mL (ref 0.00–0.09)
Troponin i, poc: <0.05 ng/mL (ref 0.00–0.09)

## 2010-11-17 LAB — APTT: aPTT: 28 seconds (ref 24–37)

## 2010-11-17 LAB — TSH
TSH: 0.488 u[IU]/mL (ref 0.350–4.500)
TSH: 1.205 u[IU]/mL (ref 0.350–4.500)

## 2010-11-17 LAB — CK TOTAL AND CKMB (NOT AT ARMC)
CK, MB: 0.6 ng/mL (ref 0.3–4.0)
Relative Index: INVALID (ref 0.0–2.5)
Total CK: 80 U/L (ref 7–177)

## 2010-11-17 LAB — IRON: Iron: 131 ug/dL (ref 42–135)

## 2010-11-17 LAB — PHOSPHORUS: Phosphorus: 3.2 mg/dL (ref 2.3–4.6)

## 2010-11-17 LAB — FOLATE: Folate: 10.8 ng/mL

## 2010-11-17 LAB — MAGNESIUM: Magnesium: 2.2 mg/dL (ref 1.5–2.5)

## 2010-11-17 LAB — CORTISOL
Cortisol, Plasma: 11.4 ug/dL
Cortisol, Plasma: 8.7 ug/dL

## 2010-11-17 LAB — VITAMIN B12: Vitamin B-12: 331 pg/mL (ref 211–911)

## 2010-11-17 LAB — CALCIUM: Calcium: 8 mg/dL — ABNORMAL LOW (ref 8.4–10.5)

## 2010-11-17 LAB — D-DIMER, QUANTITATIVE: D-Dimer, Quant: 0.31 ug/mL-FEU (ref 0.00–0.48)

## 2010-11-17 LAB — POCT PREGNANCY, URINE: Preg Test, Ur: NEGATIVE

## 2010-11-17 LAB — FERRITIN: Ferritin: 39 ng/mL (ref 10–291)

## 2010-11-17 LAB — TROPONIN I: Troponin I: <0.01 ng/mL (ref 0.00–0.06)

## 2010-11-25 ENCOUNTER — Telehealth: Payer: Self-pay | Admitting: *Deleted

## 2010-11-25 NOTE — Telephone Encounter (Signed)
Pt left message on voicemail asking for a return call. She states she saw a note from 2008 where Tereso Newcomer stated that "I suspect that she may have a degree of postural orthostatic tachycardia syndrome." Pt states that she read this note on Sunday night and stated in her message that this is what she has been going to doctors everywhere for because she has been so sick and trying to get an answer of what's wrong. She would like to know what kind of treatment she needs, etc.   Before pt was placed into nurse's voicemail, Lucendia Herrlich offered to schedule f/u with Dr. Diona Browner but pt declined. Pt last seen Sept 21, 2011 in [place] by Dr. Diona Browner. She was scheduled for f/u on 10/14 in [facility] but r/s this appt to Oct 19th. She then was a no show for her 10/19 follow appt with Dr. Diona Browner.   Left message for pt to contact the  office for further discussion of her questions but that most likely she would need to schedule an appt with Dr. Diona Browner, as she missed her last appt with him. Tammy, RN in [place] aware.

## 2010-11-26 ENCOUNTER — Telehealth: Payer: Self-pay | Admitting: *Deleted

## 2010-11-26 NOTE — Telephone Encounter (Signed)
LMOM for pt to make an appt for her cardiac needs

## 2010-12-01 ENCOUNTER — Other Ambulatory Visit (HOSPITAL_COMMUNITY): Payer: Self-pay | Admitting: Neurosurgery

## 2010-12-01 DIAGNOSIS — M549 Dorsalgia, unspecified: Secondary | ICD-10-CM

## 2010-12-03 ENCOUNTER — Other Ambulatory Visit (HOSPITAL_COMMUNITY): Payer: Self-pay | Admitting: Neurosurgery

## 2010-12-03 ENCOUNTER — Inpatient Hospital Stay (HOSPITAL_COMMUNITY): Admission: RE | Admit: 2010-12-03 | Payer: Medicare Other | Source: Ambulatory Visit

## 2010-12-03 ENCOUNTER — Ambulatory Visit (HOSPITAL_COMMUNITY)
Admission: RE | Admit: 2010-12-03 | Discharge: 2010-12-03 | Disposition: A | Discharge status: Home / Self Care | Payer: Medicare Other | Source: Ambulatory Visit | Attending: Neurosurgery | Admitting: Neurosurgery

## 2010-12-03 DIAGNOSIS — M549 Dorsalgia, unspecified: Secondary | ICD-10-CM

## 2010-12-03 DIAGNOSIS — M51379 Other intervertebral disc degeneration, lumbosacral region without mention of lumbar back pain or lower extremity pain: Secondary | ICD-10-CM | POA: Insufficient documentation

## 2010-12-03 DIAGNOSIS — M545 Low back pain, unspecified: Secondary | ICD-10-CM | POA: Insufficient documentation

## 2010-12-03 DIAGNOSIS — M5137 Other intervertebral disc degeneration, lumbosacral region: Secondary | ICD-10-CM | POA: Insufficient documentation

## 2010-12-05 ENCOUNTER — Ambulatory Visit: Payer: Medicare Other | Admitting: Urology

## 2010-12-16 NOTE — Assessment & Plan Note (Signed)
This is a 35 year old female with thoracic and lumbar degenerative disk  disease.  She has a history of small syrinx as well as fibromyalgia.  She has been followed from a neurosurgical standpoint by Dr. Lovell Sheehan.  She sees Dr. [name] from [facility] for her primary care needs.  She  has had last visit at this clinic on November 27, 2008.   She has not followed through with any physical therapy recommendations.  She has been doing some cold therapy, but really this is the things that  she does on her own, does not really have a structured program.   Her average pain is 6-8/10 range, currently 4-6.  Pain is intermittent,  but sometimes constant.  She has some numbness in her hands depending on  what position she sleeps and no significant neck pain.  She does have  some shoulder pain when she sleeps on her shoulders.  She can walk 20-30  minutes at a time.  She climbs steps.  She drives.  She is independent  with her self-care, but needs some help with meal prep, household  duties, and shopping.   REVIEW OF SYSTEMS:  She has depression, anxiety, trouble walking,  spasms, dizziness, numbness, tremor, tingling, and weakness.   Her pain meds include hydrocodone 10/325 two p.o. t.i.d.   IMPRESSION:  1. Lumbar degenerative disk, thoracolumbar syrinx.  Continue      hydrocodone 10/325 two p.o. t.i.d.  2. Fibromyalgia syndrome.  Continue activity.  3. I will see her back in about 3 months.  If any signs of neurologic      compromise, follow up with Dr. Lovell Sheehan from Neurosurgery.      Erick Colace, M.D.  Electronically Signed     AEK/MedQ  D:  02/18/2009 15:44:28  T:  02/19/2009 04:48:01  Job #:  [id number]   cc:   Cristi Loron, M.D.  Fax: [phone]

## 2010-12-16 NOTE — Op Note (Signed)
NAMECHRYSTLE, [name]               ACCOUNT NO.:  [id number]   MEDICAL RECORD NO.:  [id number]          PATIENT TYPE:  AMB   LOCATION:  DAY                           FACILITY:  [facility]   PHYSICIAN:  R. Roetta Sessions, M.D. DATE OF BIRTH:  19-Jul-1976   DATE OF PROCEDURE:  05/10/2008  DATE OF DISCHARGE:                               OPERATIVE REPORT   PROCEDURE:  Esophagogastroduodenoscopy and Maloney dilation followed by  small bowel biopsy followed by ileal colonoscopy.   INDICATIONS FOR PROCEDURE:  A 32-year lady with multiple GI complaints  including esophageal dysphagia to solids, prior abnormal barium pill  esophagram, history of erosive reflux esophagitis.  She is on high-dose  therapy with multiple PPIs.  She has intermittent hematochezia and has  had unexplained weight loss.  EGD now being performed along with  colonoscopy.  Potential for esophageal dilation and biopsies reviewed  with the patient at length previously and again at bedside specifically  talked about the risks, benefits, and limitations of this approach  questions were answered.  She is agreeable.  Please see the  documentation in the medical record.   PROCEDURE NOTE:  O2 saturation, blood pressure, pulse, and respirations  were monitored throughout the entire of both procedures.   CONSCIOUS SEDATION:  Versed 8 mg IV, Demerol 150 mg IV in divided doses,  and Phenergan 25 mg in divided doses diluted slow IV push to augment  conscious sedation.  Cetacaine spray topical pharyngeal anesthesia.   FINDINGS:  Examination of tubular esophagus revealed no abnormalities.  EG junction easily traversed.  Stomach:  Gastric cavity was emptied and  insufflated well with air.  A thorough examination of the gastric mucosa  including retroflex view of the proximal stomach, esophagogastric  junction demonstrated no abnormalities.  Pylorus was patent and easily  traversed.  Examination of the bulb, second, and third portion  revealed  no abnormalities.  Therapeutic/diagnostic maneuvers performed:  Scope  was withdrawn and a 54-French Maloney dilator was passed with full  insertion with ease and look back revealed no apparent complications  related to passage of the dilator.  The scope was advanced down in the  duodenum were biopsies in D2 and D3 were taken.  The patient tolerated  the procedure well and was prepared for colonoscopy.  Digital rectal  exam revealed no abnormalities.  Endoscopic findings:  The prep was  good.  Colon:  Colonic mucosa was surveyed from the rectosigmoid  junction through the left transverse, right colon, appendiceal orifice,  ileocecal valve, and cecum.  These structures were well seen and  photographed for the record.  Terminal ileum was intubated to 10 cm  from.  From this level, scope was slowly and cautiously withdrawn.  All  previously mentioned mucosal surfaces were again seen.  The colonic  mucosa as did the terminal ileal mucosa appeared entirely normal.  The  scope was pulled down the rectum.  A thorough examination of the rectal  mucosa including retroflex view of the anal verge demonstrated only  friable anal canal and some anal papilla.  The patient tolerated both  procedures  and was reactive to Endoscopy.   IMPRESSION:  Esophagogastroduodenoscopy, normal esophagus, stomach D2-  D3, status post passage of a Maloney dilator and biopsies of D2 and D3  subsequently.  Colonoscopy findings, friable anal canal and anal papilla  otherwise normal rectum, terminal ileum.  At this point, I suspect  significant functional overlay to Mr. Menter's symptoms.   RECOMMENDATIONS:  1. I have asked her to stop Prilosec and continue on Nexium 40 mg once      daily.  2. Continue Align probiotic recently prescribed through the office,      attending course of Anusol-HC suppositories 1 per rectum at      bedtime.  3. Follow up on path.  4. Further recommendations to follow.      Jonathon Bellows, M.D.  Electronically Signed     RMR/MEDQ  D:  05/10/2008  T:  05/10/2008  Job:  [id number]   cc:   Madelin Rear. [name], MD  Fax: [phone]

## 2010-12-16 NOTE — Group Therapy Note (Signed)
REFERRAL:  [facility], Dr. Nobie Putnam.   PURPOSE OF EVALUATION:  Evaluate and treat chronic back pain.   HISTORY OF PRESENT ILLNESS:  [name] is a 35 year old Caucasian  female with a history of low back pain, mid and cervical pain, dating to  at least March of 2006.  She reports that she underwent an MRI scan in  March 2006 after a work injury.  I do not have the results of that MRI  scan of the lumbar spine, but she subsequently underwent an MRI scan of  her thoracic and cervical spine in August of 2007.  The thoracic spine  MRI scan done on March 23, 2006 showed 2 areas of mild central cord  widening with evidence of multifocal syrinx with no change from July 17, 2005.  The MRI scan of her cervical spine showed stable prominence  of the central canal at C6-C7 with mild asymmetric uncovertebral  osteophytes at C7-T1, a little more pronounced on the right.  There was  no evidence of spinal stenosis or foraminal stenosis.   The patient reports that she initially was treated by her primary care  physician and then referred on to an OB/GYN in 2005 for determination of  the back pain being associated with her uterus.  That subsequently led  to an evaluation with Dr. Gerilyn Pilgrim in 2006, and the diagnosis of  fibromyalgia was made at that time.  She reports that she was seeing Dr.  Gerilyn Pilgrim, and that he was trying nortriptyline, Neurontin, and Lyrica.  She reports that Neurontin made her disoriented and Lyrica made her too  sleepy.  He apparently cut back her Vicodin at that time to 90 per day.   The patient also reports having seen Dr. Wynetta Emery, a neurosurgeon.  That was  for evaluation of the syrinx.  That was approximately 2 years ago, and  Dr. Wynetta Emery, according to the patient, felt that no surgery was needed  unless the syrinx changed.  The patient reports that she also saw Dr.  Alycia Rossetti, a neurosurgeon in [place], who apparently agreed with Dr. Wynetta Emery  that no surgery was  necessary at the present time.   The patient reports that she has been seeing Dr. Delbert Harness recently  for right elbow problems.  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   Most recently the patient has followed up with Dr. Nobie Putnam and has been  prescribed Lortab 10/660 one tablet q.i.d.  Dr. Nobie Putnam, apparently,  does not feel comfortable prescribing the Lortab, and referred her on to  this office for chronic pain management.   Presently, the patient reports that she has pain in her neck which she  describes as being tight all the time secondary to stress.  She reports  that pain in her mid back is worse with standing and washing dishes, and  that her back hurts when she is also stationery.  She reports that her  low back is more painful when she is walking, but there is no radiation  into her lower extremities.   PAST MEDICAL HISTORY:  1. History of iron deficiency anemia.  2. Osteopenia per studies September 2007.  3. History of rib fractures secondary to questionable severe      coughing/pleurisy.  4. Sinusitis.  5. History of colonic polyps.  6. Prior hysterectomy.  7. History of work injury, March 2006, with syrinx identified.  8. History of ovarian cysts.  9. Depression.  SOCIAL HISTORY:  The patient last worked August 2008 secondary to her  elbow problems.  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  She smokes anywhere from a half to a pack-and-a-half of  cigarettes per day and reports rare alcohol intake.  She has 2 children,  ages 75 and 61, and the patient, herself, has a high school education.   ALLERGIES:  IBUPROFEN causes chest tightness.   MEDICATIONS:  1. Fioricet 50/650 one tablet 4 times a day p.r.n.  2. Lortab 10/660 one tablet 4 times a day as needed.  3. Klonopin 2 mg 3 pills t.i.d.  4. Prozac 20 mg daily.   REVIEW OF SYSTEMS:  Positive for weight gain, diarrhea,  constipation,  coughing, respiratory infections, wheezing.   PHYSICAL EXAMINATION:  A reasonably well-appearing, middle-aged adult  female seated in a regular chair with her mother present.  The patient's vitals showed a blood pressure of 107/69 with a pulse of  76, respiratory rate 18, and O2 saturation 100% on room air.  Height was  5 feet 7 inches, weight 125 pounds.  The patient has upper extremity range of motion which is full with  exception of the right elbow where she has difficulty with full flexion  or extension.  She has only slightly decreased shoulder range of motion  bilaterally.  Upper extremity bulk and tone were normal.  Strength was  4+/5 in the left upper extremity and 4-/5 in the right upper extremity.  Lower extremity exam showed normal bulk and tone throughout.  Reflexes  were 1 to 2+ bilateral knees and ankles, and strength was 4+/5 in hip  flexion, knee extension, and ankle dorsiflexion.  In a standing position lumbar range of motion was slightly decreased in  the lateral bending and extension with fairly good flexion.  In the  supine position straight leg raise was negative to 30 degrees, and hip  range of motion was normal bilaterally with complaints of pain on the  right with full external rotation.   IMPRESSION:  1. Chronic pain related to cervical and thoracic and probably lumbar      degenerative disk disease with evidence of syrinx identified      without change per followup study.  2. In the office today we have discussed continuation of the      hydrocodone and slightly increasing the dose, and also lowering the      amount of Tylenol that she is getting on a daily basis.  We have      switched her to 10/325 hydrocodone 1 tablet 5 times per day as      needed.  A total of 150 were prescribed in the office today.  She      will use up the supply that she has at present and then fill that      prescription in early November.  We have also given her a       prescription for Soma to be used 350 mg 1 tablet p.o. nightly      p.r.n.  We will plan on seeing her in followup in approximately 2      months' time with adjustment of medicines as necessary at that      time.           ______________________________  Ellwood Dense, M.D.     DC/MedQ  D:  05/27/2007 11:26:39  T:  05/28/2007 10:26:59  Job #:  [id number]

## 2010-12-16 NOTE — Assessment & Plan Note (Signed)
REASON FOR EVALUATION:  Ms. Croghan returns to the clinic today for a  followup evaluation.  She is doing fairly well overall.  She has been  using her hydrocodone 10/325 approximately 6 times per day and reports  that she still gets benefit.  She is in the process of seeking  disability.  She still smokes approximately three-quarter packs of  cigarettes per day.  She does have some stress related to a cousin's  recent possible suicide.  She gets her Fioricet from her primary care  physician, Dr. [name].   REVIEW OF SYSTEMS:  Positive for diarrhea, constipation and fever and  chills.   MEDICATIONS:  1. Hydrocodone 10/325 mg one tablet 6 times per day p.r.n.  2. Klonopin 2 mg three tablets t.i.d.  3. Prozac 80 mg daily.  4. Fioricet 1 tablet q.i.d. p.r.n.   PHYSICAL EXAMINATION:  GENERAL:  A well-appearing, fit adult female in  mild acute discomfort.  VITAL SIGNS:  Blood pressure 122/74 with a pulse of 96 and an O2  saturation of 98% on room air.  NEUROMUSCULAR:  She has 4+/5 strength throughout.  She ambulates without  any assistive device.   IMPRESSION:  Chronic pain related to cervical, thoracic and lumbar  degenerative disk disease with evidence of syrinx identified without  change per recent studies.   In the office today, we did reveal the patient's hydrocodone as of January 14, 2008.  She continues to get benefit without signs of diversion or  significant side-effects.  We will plan on seeing her in followup in  approximately 2-3 months' time with refills prior to that appointment as  necessary.           ______________________________  Ellwood Dense, M.D.     DC/MedQ  D:  01/06/2008 11:25:53  T:  01/06/2008 12:21:27  Job #:  [id number]

## 2010-12-16 NOTE — Assessment & Plan Note (Signed)
Ms. Cajas returns to the clinic today for followup evaluation.  Overall, she still reports significant pain in her wrist, elbows, and  shoulders, especially throughout the night.  She has been using her  hydrocodone at least 6 and sometimes 7 or 8 times per day.  She reports  severe stiffness throughout her joints, which she reports are relieved  to some degree with the pain medicines.  She does not wish to proceed  with any surgery as she has been told that there are syrinx of her  cervical, thoracic, and lumbar spine and that surgery would be  extensive.  She has been placed on Provigil and Vyvanse by her primary  care physician.   MEDICATIONS:  1. Hydrocodone 10/325 one tablet 6 times per day p.r.n. (6-8 per day).  2. Klonopin 2 mg 3 tablets t.i.d.  3. Prozac 80 mg daily.  4. Fioricet 1 tablet q.i.d. p.r.n.  5. Provigil daily.  6. Vyvanse daily.   REVIEW OF SYSTEMS:  Positive for chills, diarrhea, and constipation  related to irritable bowel syndrome, reflux, and gastritis.   PHYSICAL EXAMINATION:  Briefly, well-appearing adult female in mild  acute discomfort.  Vitals were not obtained in the office today.  She  has 4+/5 strength throughout.  She ambulates without any assistive  device.   IMPRESSION:  Chronic pain related to cervical/thoracic/lumbar  degenerative disk disease with evidence of syrinx.   PLAN:  In the office today, we did discuss with the patient  inappropriate use of medication.  We will allow her to go up to 7 times  per day, a total of 7 tablets per day.  She understands that she needs  to comply with our restrictions and that she wishes to be discharge from  this office.  I have made that explicitly clear to her in the office  today.  We did refill a prescription for April 04, 2008, a total of  210, which should covered her 30 days at 7 tablets per day maximum.  She  must follow the instructions at this point.  She continues to get her  other medicines  through Dr. Louellen Molder, her primary care physician.   We will plan on seeing him at followup in approximately 3 months time  with refills prior to that appointment as necessary.           ______________________________  Ellwood Dense, M.D.     DC/MedQ  D:  04/02/2008 15:00:35  T:  04/03/2008 05:03:51  Job #:  [id number]

## 2010-12-16 NOTE — Assessment & Plan Note (Signed)
[facility]                          [facility] OFFICE NOTE   KERISSA, COIA                      MRN:          [id number]  DATE:01/24/2007                            DOB:          19-Jan-1976    CARDIOLOGY NEW PATIENT EVALUATION:   PRIMARY CARE PHYSICIAN:  Madelin Rear. [name], M.D.   CARDIOLOGIST:  She had previously been followed by Dr. Domingo Sep at  [facility].  She will be new to Dr. Lewayne Bunting with [facility].   CHIEF COMPLAINT:  Syncope.   HISTORY OF PRESENT ILLNESS:  [name] is a 35 year old female patient  with prior extensive cardiac work-up by Dr. Domingo Sep approximately 2-3  years ago.  According to the records I have received, she apparently had  an echocardiogram that revealed normal LV size and function with mild MR  and mild TR.  There was also the possibility of a bicuspid aortic valve,  but Doppler parameters were normal.  A stress Cardiolite was notable for  anterior wall ischemia with an EF of 54%.  Holter monitor showed sinus  rhythm with occasional PAC's and frequent mild sinus tachycardia, but no  significant arrhythmia.  She was set up for cardiac catheterization.  According to the records, this revealed normal coronary arteries and  normal left ventricular function with an ejection fraction of 50%.  It  was felt that her heart rate increases were secondary to a combination  of uncontrolled depression, anxiety, along with hypovolemia and  orthostatic hypotension.  The patient apparently saw a dentist who noted  her heart murmur and need for prophylactic antibiotics.  She notes that  she was never told this by Dr. Domingo Sep and decided to no longer be seen  there.  She obtained her records from their office.  On January 02, 2007,  she had a syncopal episode and decided to come to [place] for further  evaluation.   The patient notes that on January 02, 2007 she was at rest and stood to go  to another room.  Shortly after standing, she became near syncopal.  She  also noted her heart racing and counted her pulse at about 130.  She sat  down for a while and ate a meal.  She then got up to go fold clothes.  A  few minutes after standing to go fold clothes, she had a syncopal  episode, apparently without warning.  She was apparently out for a  couple of seconds.  When she came to, she felt somewhat disoriented.  She does note near syncopal episodes, probably for the last 3 years,  that are associated with standing.  She describes mainly orthostatic  symptoms.   She does also relate a history of constant chest tightness.  This is not  necessarily with exertion.  She notes it more with increased stress.  She also notes shortness of breath with increased stress.  She usually  takes Klonopin, and her symptoms sometimes resolve.  She notes dyspnea  on exertion over the last 3 years.  She really described New [employer] Heart  Association  class II symptoms.  She does note some lower extremity edema  from time to time that seems to be mild.  She denies orthopnea or  paroxysmal nocturnal dyspnea.  She did become nauseated at one point  with her chest discomfort, and she went to the emergency room.  She was  placed on Nexium at that time.   PAST MEDICAL HISTORY:  1. She denies any history of diabetes mellitus or hypertension,      thyroid, lung or kidney disease.  2. She does note a history of unexplained anemia that has been      evaluated by Dr. [name].  3. She notes a history of fibromyalgia with osteoarthritis and lumbar      and cervical degenerative disk disease.  4. She apparently has mild hypercholesterolemia, but is not on any      current therapy.  5. History of depression/anxiety.   MEDICATIONS:  1. Zantac 75 mg three tablets daily.  2. Klonopin 2 mg p.r.n.  3. Fioricet p.r.n.  4. Lortab p.r.n.   ALLERGIES:  No known drug allergies.   SOCIAL HISTORY:  She smokes a pack  of cigarettes per day for the last 15  years for a 15-pack year history.  She tried marijuana a couple times  when she was a teenager, but has had no ongoing drug abuse.  Denies any  alcohol abuse.   FAMILY HISTORY:  Significant for coronary disease in her mother who had  a myocardial infarction treated with stenting in her 45s.   REVIEW OF SYSTEMS:  Please see HPI.  She notes quite a bit of fatigue.  She has minimal snoring.  There is daytime hypersomnolence.  She denies  any melena, hematochezia, hematuria or dysuria.  She denies any facial  drooping, difficulty with speech or monocular blindness, or unilateral  weakness.  Denies any symptoms consistent with claudication.  The rest  of the review of systems are negative.   PHYSICAL EXAMINATION:  GENERAL:  She is a well-nourished, well-developed  female in no acute distress.  VITAL SIGNS:  Blood pressure 111/76, pulse 66, weight 128 pounds.  HEENT:  Normal.  NECK:  Without JVD.  ENDOCRINE:  Without appreciable thyromegaly.  CAROTIDS:  She does have a very loud venous hum, especially on the  right.  It is also appreciable on the left.  Question whether or not  this is related to her thyroid gland.  CARDIAC:  Normal S1 and S2.  Regular rate and rhythm without murmur.  LUNGS:  Clear to auscultation bilaterally without wheezing, rhonchi or  rales.  ABDOMEN:  Soft and nontender with normoactive bowel sounds.  No  organomegaly.  EXTREMITIES:  Without edema.  Calves soft, nontender.  SKIN:  Warm and dry.  NEUROLOGIC:  She is alert and oriented x3.  Cranial nerves II-XII  grossly intact.   ELECTROCARDIOGRAM:  Sinus bradycardia with a heart rate of 49.  No  ischemic changes.   IMPRESSION:  1. Syncope.  2. Palpitations.  3. Fatigue.  4. Normal coronary arteries by catheterization within the last couple      of years.      a.     Records pending. 5. Prior history of normal left ventricular function.      a.     Records pending.   6. Fibromyalgia.  7. Degenerative disk disease.  8. Depression/anxiety.  9. Gastroesophageal reflux disease.  10.History of anemia.  11.Right neck venous hum.   PLAN:  The patient was  also interviewd and examined by Dr. Andee Lineman today.  She presents with a chief complaint of syncope.  She has apparently had  a fairly normal cardiac work-up in the past.  She complains of quite a  bit of fatigue.  She also notes some daytime hypersomnolence.  She has  also had palpitations/tachypalpitations in the past.  She has a loud  venous hum on the right side of her neck.  We question whether or not  this may be related to her thyroid.  It does not sound like a classic  carotid bruit.  At this point in time, we plan to proceed with a 2-D  echocardiogram, as well as a 30-day CardioNet monitor.  She will be set  up for blood work to include CMET, CBC, TSH, free T4, free T3 and a  magnesium level.  I have advised her to do no driving until we see her  back in follow up.  Will also get her set up with Dr. Graciela Husbands in 1 month's  time to consider proceeding with tilt table testing for her unexplained  syncope.  Will also get her set up for a thyroid ultrasound, given the  abnormality of the exam on her neck.  I will bring her back in follow up  with Dr. Andee Lineman in the next 6-8 weeks.  In addition, will also set her  up for an apnea link at home to rule out the possibility of sleep apnea,  as she does describe some mild snoring, as well as significant daytime  hypersomnolence.   I was present to examine the patient. She has a prominent venous hum  in the right neck area. This continous murmur disapeared with  compression of the right jugular vein. The murmur also became quiescent  in the supine position. Differential diagnosis includes persistent PDA,  but this is unlikely given the above findings. A 2DECHO has been ordered  to r/o the latter diagnosis. I  have discussed the findings with the patient and  Tereso Newcomer PA-C  Peyton Bottoms, MD.      Tereso Newcomer, PA-C  Electronically Signed      Learta Codding, MD,FACC  Electronically Signed   SW/MedQ  DD: 01/24/2007  DT: 01/24/2007  Job #: [id number]   cc:   Madelin Rear. [name], MD

## 2010-12-16 NOTE — Assessment & Plan Note (Signed)
DATE OF EVALUATION:  October 13, 2007   Ms. Dismore returns to clinic today accompanied by her mother.  The  patient is doing fairly well overall.  We last saw her in this office  July 21, 2007.  She continues to care for her children and does not  work outside the home.  She is using hydrocodone approximately 6 tablets  per day and the Fioricet only on an as needed basis.  She is considering  vocational rehabilitation in the future.  She does need a refill on her  hydrocodone and also asked about medication for smoking cessation.  She  smokes approximately two packs per day at the present time.   REVIEW OF SYSTEMS:  Positive for weight gain, diarrhea, constipation and  limb swelling.   MEDICATIONS:  1. Hydrocodone 10/325 one tablet 6 times per day p.r.n.  2. Klonopin 2 mg 3 tablets t.i.d.  3. Prozac 80 mg.  4. Fioricet 50/650 one tablet q.i.d. p.r.n.   PHYSICAL EXAMINATION:  Reasonably well appearing younger adult female in  mild to no acute discomfort.  Vitals were not obtained in the office  today.  She ambulates without any assistive device.  She has 4+/5  strength throughout.   IMPRESSION:  Chronic pain related to cervical, thoracic and lumbar  degenerative disk disease with evidence of syrinx identified without  change per recent studies.   In the office today we did refill the patient's hydrocodone and gave her  a new prescription for Nicoderm Patches at 21 mg per day to be changed  to q. day a total of 30.  She will call in for a refill in approximately  four weeks' time, and we will probably decrease her down to 14 mg per  day.  She will probably need a total of three months to come off the  tobacco completely secondary to her use of two packs a day at present.  We will plan on seeing her in followup in approximately two to three  months' time with refill of medication prior to that appointment as  necessary.           ______________________________  Ellwood Dense, M.D.     DC/MedQ  D:  10/14/2007 10:45:13  T:  10/14/2007 16:37:51  Job #:  [id number]

## 2010-12-16 NOTE — Assessment & Plan Note (Signed)
[facility] HEALTHCARE                       [place] CARDIOLOGY OFFICE NOTE   KADIJAH, SHAMOON                      MRN:          [id number]  DATE:12/07/2008                            DOB:          Aug 09, 1975    PRIMARY CARE PHYSICIAN:  Dr. Madelin Rear. Fusco.   REASON FOR VISIT:  Relative sense of a rapid heart rate and dizziness  associated with weakness.   HISTORY OF PRESENT ILLNESS:  This is my first meeting with Ms. Chastang.  She was seen by Dr. Learta Codding in our practice back in July of 2008  in the [facility].  Prior to that she was seen by Dr. Dani Gobble,  with the [facility].  She has undergone a fairly  extensive cardiac evaluation over the years, including a cardiac  catheterization demonstrating normal coronaries, a 30-day event recorder  demonstrating no significant cardiac dysrhythmias, a 2-D  echocardiography demonstrating a left ventricular ejection fraction of  50-55% with possibly a functionally bicuspid aortic valve, carotid  Dopplers demonstrating no significant atherosclerotic plaque but  increased velocities, normal pulmonary function tests, and a normal  apnea link monitor.  She was originally referred back in 2008, with  unexplained syncope as well as palpitations and fatigue.  In reviewing  Ms. 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   She was admitted to [facility] back in January, reportedly with  an episode of chest pain, and ruled out for a myocardial infarction.  In  discussing the episode with the patient today, she tells me that she  experienced a spell around that time when she was seated reading her  mail, and suddenly felt a stabbing nausea in her  epigastric/stomach  area.  This was followed subsequently by weakness and dizziness that  caused her to have to lie down for 20 minutes after taking some  Klonopin.  She states that ultimately she felt better, although her  heart rate was somewhat increased.  She does not report any typical  exertional chest pain.  She has had longstanding episodes similar to the  one just described, where she has dizziness and weakness that almost  sounds orthostatic in description, most likely neurocardiogenic.  She  typically has a low normal blood pressure which also certainly could  contribute to this.  Today's electrocardiogram shows sinus rhythm with  possible left atrial enlargement and nonspecific ST changes.  Prior  tracing in the chart from 2008, shows sinus bradycardia at 49 beats per  minute, otherwise with similar changes.  Ms. Esterline states that she was  not able to tolerate Lopressor in the past, prescribed by Dr. Domingo Sep,  due to her low blood pressure.  There is concern recently about the  possibility of worsening depression symptoms and some discussion in her  follow-up visit note from April 27th with Dr. Erick Colace, that  she may actually need  some medication therapy adjustments, perhaps  changing from Prozac to Cymbalta.   Ms. Fiallos is fairly anxious at baseline and her history is a bit  difficult to follow.  She on the one hand denies feeling overly  depressed; however, on the other hand, gives several of the symptoms  that would be consistent with depression.  She has not experienced any  syncope.   ALLERGIES:  NO KNOWN DRUG ALLERGIES.   MEDICATIONS:  1. Nexium 40 mg p.o. b.i.d.  2. Klonopin 2 mg p.o. q.i.d.  3. Prozac 80 mg p.o. daily.  4. Abilify 10 mg p.o. daily.  5. Provigil 200 mg p.o. b.i.d.  6. Hydrocodone 10/325 mg as directed p.r.n.  7. Tums or Mylanta p.r.n.  8. Fioricet p.r.n.   REVIEW OF SYSTEMS:  As outlined above.  She reports problems with   chronic recurrent headaches.  She states her mentation seems slowed,  with difficulty concentrating.  She reports generalized anhedonia.  She  feels weak all the time, even with activities of daily living.  She has  had no vertigo symptoms.  No lower extremity edema.  No orthopnea or  PND.  No reproducible exertional chest discomfort.  Otherwise reviewed  and negative.   PHYSICAL EXAMINATION:  VITAL SIGNS:  On examination blood pressure  122/84, heart rate 89, weight is 119 pounds.  No significant orthostatic  change noted on blood pressure measurements.  Heart rate does increase  somewhat, although not associated with any frank dizziness.  Her peak  heart rate after standing for two minutes was 122.  GENERAL:  She is a thin, anxious woman, in no acute distress.  HEENT:  Conjunctiva and lids normal.  Oropharynx clear.  NECK:  Supple.  No elevated venous pressure no loud bruits, possible  soft venous hum in the left upper thorax (noted previously).  LUNGS:  Lungs are clear without labored breathing at rest.  No wheezing  is noted.  CARDIAC:  Reveals a regular rate and rhythm.  There is no S3 gallop or  loud systolic murmur.  No diastolic murmur.  ABDOMEN:  Soft, nontender.  No bruits.  Bowel sounds present.  EXTREMITIES:  Extremities exhibit no significant pitting edema.  Her  pulses are 2+.  SKIN:  Warm and dry.  MUSCULOSKELETAL:  No kyphosis is noted.  NEUROPSYCHIATRIC:  The patient is alert and oriented x3,frequently  tearful when giving her history.  No gross focal motor deficits.   IMPRESSION/RECOMMENDATIONS:  1. Baseline weakness and anhedonia, in the setting of known depression      with reported exacerbations of dizziness and palpitations,      sometimes following nausea.  It may well be that Ms. Berzins has an      element of neurocardiogenic sensitivity, perhaps even postural      orthostatic tachycardia.  She has not had any frank syncope,      however.  Previous cardiac  evaluation has not shown any clear      dysrhythmias and I doubt that this is the case at this time either.      Her electrocardiogram is fairly nonspecific.  She is not frankly      orthostatic by blood pressure checks on today's examination.      Ongoing treatment with a selective serotonin uptake inhibitor may      actually help this issue.  A beta-blocker will not be pursued,      since she apparently did not tolerate this in the past.  We  talked      about basic lifestyle modifications such as maintaining an      adequately hydrated status, and sitting or lying down with onset of      symptoms.  It may be that a volume expander such as Florinef could      be considered down the road if needed.  Repeat cardiac monitoring      will not be pursued at this time, as it is unlikely to be      particularly helpful, given her previous evaluation.  2. Previous echocardiography demonstrating the possibility of a      functionally bicuspid aortic valve.  A follow-up echocardiogram      will be obtained, to reassess valvular status and also reconfirm      normal left ventricular systolic function, which would be very      reassuring at this time.  If this study is largely unremarkable, I      doubt that further investigation will need to be taken from a      cardiac perspective.  3. As it relates to ischemic heart disease, the patient's present      symptoms are quite atypical.  I do not think that further ischemic      testing is warranted at this time, particularly in light of her      previously normal angiogram.  Ms. Ortlieb does have some family      history of cardiovascular disease, and continues to smoke      cigarettes.  I spoke with her today about smoking cessation.     Jonelle Sidle, MD  Electronically Signed    SGM/MedQ  DD: 12/07/2008  DT: 12/07/2008  Job #: [id number]   cc:   Madelin Rear. [name], MD

## 2010-12-16 NOTE — Discharge Summary (Signed)
[name], [name] NO.:  [id number]   MEDICAL RECORD NO.:  [id number]          PATIENT TYPE:  OBV   LOCATION:  1421                         FACILITY:  [facility]   PHYSICIAN:  Elliot Cousin, M.D.    DATE OF BIRTH:  1975/12/19   DATE OF ADMISSION:  08/25/2008  DATE OF DISCHARGE:  08/26/2008                               DISCHARGE SUMMARY   DISCHARGE DIAGNOSES:  1. Chest pain.  Myocardial infarction ruled out.  2. Normocytic anemia.  3. Chronic pain, fibromyalgia, chronic fatigue, and depression.  4. Low to low-normal blood pressures, typical for patient.   DISCHARGE MEDICATIONS:  1. Nexium 40 mg b.i.d.  2. Klonopin 2 mg 4 times daily as needed.  3. Provigil 200 mg b.i.d.  4. Vyvanse 80 mg daily.  5. Butalbital/ acetaminophen take as directed.  6. Ferrous sulfate 325 mg b.i.d.   DISCHARGE DISPOSITION:  The patient is being discharged to home in  improved and stable condition.  She was advised to follow up with her  primary care physician, Dr. [name], psychiatrist, Dr. Omelia Blackwater, and  gastroenterologist Dr. Jena Gauss in 5 to 7 days for further evaluation and  management.  She was also advised to follow up with her cardiologist at  [facility] as needed.   CONSULTATIONS:  None.   PROCEDURES PERFORMED:  Chest x-ray on August 26, 2007.  The results  revealed no acute abnormality.   HISTORY OF PRESENT ILLNESS:  The patient is a 35 year old woman with a  past medical history significant for normal coronary arteries per  catheterization in the past, migraine headaches, chronic fatigue  syndrome, fibromyalgia, and gastroesophageal reflux disease.  She  presented to the emergency department on August 25, 2008, with a chief  complaint of chest pain.  She also had associated shortness of breath,  palpitations, and nausea.  When she was evaluated in the emergency  department, she was noted to be hemodynamically stable.  Her EKG  revealed sinus rhythm with sinus  arrhythmia and a heart rate of 61 beats  per minute.  Her chest x-ray revealed no acute disease.  Her initial  cardiac markers were negative.  The patient was admitted for further  evaluation and management.   For additional details, please see the dictated history and physical.   HOSPITAL COURSE:  1. CHEST PAIN.  The patient was restarted on all of her chronic      medications.  She was treated with as-needed Vicodin and as-needed      Tylenol for pain.  For further evaluation, cardiac enzymes, TSH, D-      dimer, and a urine drug screen were ordered.  The patient's cardiac      enzymes were completely normal.  Her TSH was within normal limits      at 1.2.  Her D-dimer was within normal limits at 0.31.  Her urine      drug screen was positive for amphetamines, barbiturates,      benzodiazepines, and opiates.  (The patient is chronically treated      with all of these medications).  A followup EKG  revealed normal      sinus rhythm with no acute abnormalities and with a heart rate of      82 beats per minute.  As indicated above, the patient has a history      of normal coronary arteries per cardiac catheterization a few years      ago.  Her ejection fraction was estimated to be 55%.  The patient      also has a history of gastroesophageal reflux disease and is status      post mild esophageal dilatation last year.  She was maintained on      proton pump inhibitor during the hospitalization.  She had no      complaints of dysphasia or odynophagia.  I reviewed the patient's      medications and apparently Provigil can cause chest pain and      palpitations.  The patient also reported that she drinks 1 pot of      coffee daily.  I advised the patient to decrease her intake of      caffeine to approximately half over the next week or two.  She      voiced understanding.  I also instructed her to discuss the      potential side effects of Provigil with her primary care physician,      Dr.  [name], and/or her psychiatrist, Dr. Omelia Blackwater to see if the dose      can be decreased.  The patient was chest pain free at the time of      hospital discharge.  2. NORMOCYTIC ANEMIA.  The patient's hemoglobin ranged from 10.5 to      11.8 during the hospitalization.  Iron studies were ordered and      revealed a total iron of 131, vitamin B12 of 331, ferritin of 39,      and folate of 10.8.  Apparently, the patient underwent an upper      endoscopy and colonoscopy recently by her gastroenterologist, Dr.      Jena Gauss.  According to the patient, there were no gross      abnormalities.  Because her ferritin was at the lower end of      normal, she was started on ferrous sulfate b.i.d. upon discharge.      Elliot Cousin, M.D.  Electronically Signed     DF/MEDQ  D:  08/26/2008  T:  08/26/2008  Job:  [id number]   cc:   Madelin Rear. [name], MD  Fax: [phone]   Dr. Vernice Jefferson. Roetta Sessions, M.D.  [address]  [place]  [place] [address]

## 2010-12-16 NOTE — Assessment & Plan Note (Signed)
[name] returns to clinic today for followup evaluation.  She reports  that she has been using her hydrocodone as directed and maximum of 7  tablets per day and getting good relief overall.  She reports that she  does experience some stress and depression and she continues to be  followed by Dr. Lawerance Bach.  She is presently on Prozac and reports that  others have been tried, but she experienced side effects.  Dr. Lawerance Bach  also manages her Klonopin and Provigil.  Dr. [name] prescribes her  Fioricet, which she uses for stress.  The patient does need a refill on  her hydrocodone near the end of this week.   MEDICATIONS:  1. Hydrocodone 10/325 one tablet 7 times per day p.r.n.  2. Klonopin 2 mg t.i.d.  3. Prozac 80 mg.  4. Fioricet 1 tablet q.i.d. p.r.n.  5. Provigil daily.  6. Vyvanse daily.   REVIEW OF SYSTEMS:  Noncontributory.   PHYSICAL EXAMINATION:  GENERAL:  A well-appearing adult female in mild  to no acute discomfort.  VITAL SIGNS:  Blood pressure 90/57 with pulse of 80, respiratory rate  18, and O2 saturation 100% on room air.  MUSCULOSKELETAL:  She ambulates without any assistive device and has had  at least 4+/5 strength throughout.   IMPRESSION:  Chronic pain related to cervical/thoracic/lumbar  degenerative disk disease with evidence of syrinx.   In the office today, we did refill the patient's hydrocodone, maximum of  210 as of June 30, 2008.  She appears to be compliant with  medication after our talk last visit.  We will plan in seeing the  patient in followup in this office in approximately 3 months' time with  refills prior to that appointment if necessary.           ______________________________  Ellwood Dense, M.D.     DC/MedQ  D:  06/25/2008 14:57:18  T:  06/26/2008 02:55:31  Job #:  [id number]

## 2010-12-16 NOTE — Assessment & Plan Note (Signed)
[name] has a history of cervical thoracic lumbar degenerative disk  as well as a history of small syrinx, prior history of noncardiac chest  pain, status post hospitalization for rule out cardiac cause.   She also has a diagnosis of fibromyalgia syndrome.   Her past history is significant for anxiety.  She really has not been on  medications for this.  She is asking about Cymbalta.  However, her  psychiatrist has her on Prozac for depression and anxiety symptoms.   Her average pain is in 7/10 range involving the back, bilateral  shoulders, bilateral hip as well as the hands and wrists at times.  She  has some intermittent as well as constant pain improving with rest, heat  therapy, and medications.  She can walk 15-20 minutes at a time.  She  can climb steps.  She can drive.  She needs some assistance with meal  prep, household duties, and shopping.  She has been on disability since  August 2008.   REVIEW OF SYSTEMS:  Positive for numbness and tingling, occasionally  trouble walking, spasms, dizziness, chills, high blood sugar, and other  physicians.   SOCIAL HISTORY:  Married.  Alcohol abuse.   FAMILY HISTORY:  Type 2 diabetes, high blood pressure.   PHYSICAL EXAMINATION:  VITAL SIGNS:  Blood pressure 96/63, pulse 97,  respirations 18, and O2 sat 99% on room air.  GENERAL:  Thin female in no acute distress.  NEUROLOGIC:  Orientation x3.  Affect alert.  MUSCULOSKELETAL:  Gait normal.  She has 8/18 fibromyalgia tender points,  bilateral upper trap, bilateral hip, low back, and elbow.  Her strength  is full in bilateral upper and lower extremities.  Sensation normal.  Deep tendon reflexes normal.   IMPRESSION:  1. Fibromyalgia syndrome.  I asked her to check with her psychiatrist      whether she could change from Prozac to Cymbalta.  2. History of thoracolumbar syrinx as well as some lumbar degenerative      disk.  We will continue her hydrocodone 10/325 two p.o. t.i.d.   I will see her back in 1-2 months.  I also recommended that she go  through some physical therapy for some guidance in terms of home  exercise program which she would like to pursue.      Erick Colace, M.D.  Electronically Signed     AEK/MedQ  D:  11/27/2008 15:36:57  T:  11/28/2008 03:34:17  Job #:  [id number]   cc:   Madelin Rear. [name], MD  Fax: [phone]   Cristi Loron, M.D.  Fax: [phone]

## 2010-12-16 NOTE — Assessment & Plan Note (Signed)
[name] returns to the clinic today for followup evaluation.  I first  and last saw her in this office May 26, 2007 for evaluation and  treatment of chronic back pain.  At that time, we had switched her to  hydrocodone 10/325 to decrease the amount of Tylenol she was getting.  She was allowed to use hydrocodone 5 times per day.   The patient reports she has undergone right elbow surgery June 06, 2007 with Dr. Eulah Pont.  She reports that she was given a minimal amount  of oxycodone and has used that up.  She also reports that Dr. [name], her  primary care physician, is suggesting a possible repeat MRI scan of her  thoracic spine.  Prior studies were done in 2006 showing multifocal  syrinx.  The patient reports that Dr. Omelia Blackwater continues to prescribe her  Prozac, Klonopin, and Abilify.  Dr. [name] handles her Fioricet.   In terms of her hydrocodone, she has been using up to 6 tablets per day  on an as needed basis.  She wonders if she could have an adjustment in  her prescriptions.   REVIEW OF SYSTEMS:  Positive for weight gain, fever.   MEDICATIONS:  1. Hydrocodone 10/325 one tablet 5 times per day p.r.n. (5 to 6 per      day).  2. Klonopin 2 mg 3 pills t.i.d.  3. Prozac 80 mg daily.  4. Fioricet 50/650 one tablet q.i.d. p.r.n.   PHYSICAL EXAMINATION:  Reasonably well-appearing young adult female in  mild to in no acute discomfort.  Blood pressure 121/69 with a pulse of 77, respiratory rate 16, and O2  saturation 98% on room air.  She has 4+/5 to 5-/5 strength in the bilateral upper and lower  extremities.  Bulk and tone were normal.   IMPRESSION:  Chronic pain related to cervical and thoracic, and probable  lumbar degenerative disk disease with evidence of syrinx identified  without change per recent studies.   In the office today, we did adjust the patient's hydrocodone up to 1  tablet 6 times per day of the 10/325 dose.  We plan on seeing her in  followup in  approximately 3 to 4 months' time.  She should not increase  the medication on her own and I have warned her against that in the  office today.           ______________________________  Ellwood Dense, M.D.     DC/MedQ  D:  07/21/2007 09:25:21  T:  07/21/2007 12:06:47  Job #:  [id number]

## 2010-12-16 NOTE — Assessment & Plan Note (Signed)
[name] has chronic pain related to cervical thoracic and lumbar  degenerative disc disease.  She has a history of a small syrinx.  She  was recently hospitalized at [facility] for noncardiac chest pain.  The  patient has pain mainly in the chest and back area as well as hands,  hips, knees, and feet as well as headaches.  His pain descriptors, sharp  burning, intermittent, dull, stabbing, constant tingling, aching,  averaging 6/10 to 8/10, currently 7/10 to 8/10.  Pain interferes with  activity at 7/10 level.  Pain is worse with walking, bending, sitting,  inactivity, standing.  Relief from meds is fair to good.  She can walk  15 minutes and then sit down.  She drives.  She sometimes needs help  with transfers per her report.   She has fevers that her syrinx will rupture.  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   REVIEW OF SYSTEMS:  Positive for chills, nausea, depression anxiety.   PAST HISTORY:  Significant for anxiety.  She also has a history of IVF  and low blood pressure, which hospital does know about.   SOCIAL HISTORY:  Married, lives with her husband and children.  Smokes 1  pack per day or less.   PHYSICAL EXAMINATION:  VITAL SIGNS:  Blood pressure 114/72, pulse 83,  respirations 18, O2 sat 100% room air.  GENERAL:  Anxious young female who is in no acute distress.  NEUROLOGIC:  Gait is normal.  She is able to toe walk and heel walk.  She is able to tandem gait.  She has a negative Romberg.  She has normal  coordination in the upper and lower extremities.  She has normal deep  tendon reflexes in upper and lower extremity, normal sensation in upper  and lower extremities, normal strength in upper and lower extremities.  She has mild tenderness to palpation in the cervical thoracic and lumbar  areas.   IMPRESSION:  Overall, this patient presents as a fibromyalgia patient.  She is on hydrocodone per previous  physician.  We will continue current  dose i.e. 6 times with a mild reduction from 7 per day to 6 per day.  Check urine drug screen, give 10-day supply.  If urine drug screen shows  evidence of illicit drugs or nonprescribed opiates, we will need to  discharge the patient from clinic.   If she remains with the clinic here, we will need to trial some other  medications, more typically used for fibromyalgia, such as Cymbalta or  Lyrica and overall reducing narcotic analgesic usage.      Erick Colace, M.D.  Electronically Signed     AEK/MedQ  D:  10/02/2008 17:42:47  T:  10/03/2008 05:45:57  Job #:  [id number]   cc:   Dr. [facility]  [place], [place]

## 2010-12-16 NOTE — H&P (Signed)
NAMEMARVEEN, Shea NO.:  [id number]   MEDICAL RECORD NO.:  [id number]          PATIENT TYPE:  OBV   LOCATION:  1421                         FACILITY:  [facility]   PHYSICIAN:  Vanetta Mulders, MD         DATE OF BIRTH:  Dec 12, 1975   DATE OF ADMISSION:  08/25/2008  DATE OF DISCHARGE:                              HISTORY & PHYSICAL   The patient will be admitted to [facility].   Primary care physician is Dr. [name].   CHIEF COMPLAINT:  Chest pain.   HISTORY OF PRESENT ILLNESS:  [name] is a 35 year old lady with past  medical history significant for depression and fibromyalgia, severe  anxiety, migraine headaches and chronic fatigue with questionable  functional bicuspid aortic valve but good ejection fraction on previous  2-D echo in 2008 at 55%, chronic back pain on multiple medications and  severe gastroesophageal reflux status post esophageal dilation.  The  patient comes in today for chest pain.  The patient states that she  started having chest pain the day prior to admission at around 7:00 p.m.  She walked outside to pick up her mail, and she suddenly became  nauseated.  The nausea lasted for approximately 5 minutes and afterwards  the patient started having retrosternal pain, tightness, not associated  with shortness of breath but associated with palpitations and associated  with esophageal burning. The patient denies vomiting, hematochezia,  hematemesis, melena, abdominal pain, denies lower extremity edema and  admits having a headache at the interview which she attributes to  migraine and anxiety.  She denies having cough, fever, chills, or  extensive shortness of breath.   PAST MEDICAL HISTORY:  1. Fibromyalgia.  2. Depression.  3. Chronic fatigue.  4. Excessive daytime sleepiness.  5. Depression and anxiety.  6. GERD and esophageal stricture status post esophageal dilation.  7. History of chronic normocytic anemia and iron deficiency anemia.  8. Bilateral internal carotid artery stenoses 40-59%.  9. History of hypokalemia.  10.History of unexplained syncope previously seen by [facility]      Cardiology. No recurrent syncopal episodes since 2008.  11.Questionable functional bicuspid aortic valve.  12.History of previous chest pain with normal coronary arteries by      catheterization according to previous cardiology visit records.  13.History of palpitations with normal 30-day event monitor in 2008.   ALLERGIES:  Patient is allergic to IBUPROFEN.   MEDICATIONS:  1. Nexium 40 mg p.o. b.i.d.  2. Klonopin 2 mg four times a day p.r.n.  3. Vicodin 5/325 q.6h. p.o. p.r.n.  4. Provigil 200 mg p.o. b.i.d.  5. Vyvanse 80 mg p.o. daily.  6. Butalbital and acetaminophen as needed.   FAMILY HISTORY:  The patient's mother has diabetes.  The patient's  father medical history is unknown.  The patient's brother has diabetes,  and the maternal grandfather died at 94 with an acute MI. The patient's  maternal grandmother died in her 79s with coronary artery disease.   SOCIAL HISTORY:  Patient is now disabled.  She reports some work-related  injury that disabled her.  She denies drinking. She admits smoking  approximately a pack per day.  She has been seen in the chronic pain  clinic and has received chronic narcotic therapy by several different  physicians. The last follow-up visit that is recorded in the computer is  from June 25, 2008 and was dictated by Dr. Thomasena Edis.   REVIEW OF SYSTEMS:  The patient admits having headaches. Denies having  blurry vision, double vision.  She admits having sometimes dizziness  when standing up but denies vertigo.  Denies nasal congestion, sore  throat, fever, chills, lymphadenopathy. Admits having chronic pain and  numbness in the hands and legs bilaterally.  Denies joint swelling or  skin rash, skin erythema. Denies having abdominal pain.  Denies diarrhea  or constipation.  Denies having urinary  problems and denies suicidal  ideation. The patient lost 30 pounds over  4 months, and she admits  having very poor appetite and being depressed and anxious to the point  where she sleeps during the day until 1:00pm. When she wakes up she does  not do much.  She does not contribute significantly to household chores.  She denies cleaning the house, taking care of the kids.  She stopped  interacting with her friends.  She had very poor appetite. She goes to  bed around 9:00, and she has no problems falling asleep.  She has no  problems maintaining sleep.  She does not snore. She does not wake up at  night multiple times, and she has never had a sleep study done.   PHYSICAL EXAM:  VITAL SIGNS:  Temperature 97.4, blood pressure ranging  from 124/60 when asleep to 108/60 when awake with heart rate ranging  from 55 when asleep to 77 when awake.  GENERAL APPEARANCE:  The patient is generally very thin and teary and  anxious.  She is sleepy but easily arousable and able to sustain  conversation without falling asleep.  HEENT:  Her head is normocephalic, atraumatic.  Eyes: Pupils are  slightly dilated but equal and reactive to light and accommodation.  No  nystagmus.  Ear canals clear.  Normal lucency of tympanic membrane.  Oropharynx clear.  No erythema, no exudate. Mallampati 2.  NECK: Supple.  No lymphadenopathy.  No thyromegaly.  CARDIOVASCULAR: Regular rate and rhythm. No murmurs, no rubs, no  gallops.  RESPIRATORY:  Good air movement.  No crackles, no wheezing, no rhonchi.  ABDOMEN: Bowel sounds positive, soft, nontender, nondistended.  EXTREMITIES: No rash, no edema, no cyanosis, no clubbing.  NEUROLOGIC: Cranial nerves II-XII grossly intact.  Strength 5/5  throughout, 2+ reflexes, sensation intact.  PSYCHOLOGICAL:  Alert and oriented, very anxious, asking for antianxiety  medications.   LABORATORY DATA:  Chest x-ray shows no acute disease, and laboratory  data showed TSH normal at  1.205. Pregnancy urine negative, but  the  patient is status post hysterectomy. CMP showed sodium of 141, potassium  2.9, chloride 111, bicarb 27, glucose 93, BUN 12, creatinine 0.6, AST  14, AST 16, total protein 5.8, albumin 3.8, calcium 8.3.  Cardiac  markers point of care with negative troponin less than 0.05, CK-MB less  than 1, and myoglobin 36.1.  White count 4.4, hemoglobin 10.5 and  platelets 169.   ASSESSMENT AND PLAN:  1. Chest pain, unsure etiology.  The patient has a long time history      of gastroesophageal reflux disease.  She is currently on Nexium      twice daily.  She has been seen  by gastroenterology.  She has had a      recent EGD on May 10, 2008 with esophageal dilation.  We will      continue GERD treatment.  The patient denies hematemesis, melena or      hematochesia.  We will continue to monitor CBCs.  We will recheck      cell count in the morning. To evaluate for other causes for chest      pain we are going to continue to cycle cardiac enzymes and EKGs.      Previous EKGs have shown no acute abnormalities.  On one of them      was poor R-wave progression, but I think it is a lead placement      issue. In any case the patient will be admitted to telemetry, and      we will repeat an EKG in the morning; with any concerns that the      etiology of the patient's chest pain is cardiac, we will call      cardiology consult. To rule out PE I will check a D-dimer. The      present probability that the patient had a PE is very low. We will      check a urine drug screen and admit to the hospital for 24 h      observation on telemetry.  2. Severe anxiety and depression.  The patient definitely needs to      follow up with psychiatry.  As far as the sleep problem is      concerned, I think the patient had excessive sleepiness because of      depression and medications, but she could have a sleep disorder      that has not been previously diagnosed. She might need to  have a      sleep study and MSLTs done in the future.  The patient denies      having suicidal ideation. I do not think she needs to be on      suicidal precautions. I do not think she needs a psychiatry consult      urgently.  3. Fibromyalgia.  Will continue some of the patient's home      medications, Vicodin.  The patient is definitely at  risk of      withdrawal from this medication. With her having piloerection on      exam and slightly dilated pupils I am worried that she already has      some evidence of withdrawal. So will continue her opiate      medication.  The patient will follow up with the pain clinic as      outpatient.  4. Smoking.  We will start nicotine patch and provide smoking      cessation counseling.  5. Gastroesophageal reflux disease. We will start Protonix IV.  If the      patient continues to have severe chest pain with no evidence of      coronary artery disease or other etiology we will consider      gastroenterology consult.  6. Slight hypotension while in the emergency department when the      patient was asleep.  I think the patient's blood pressure is      usually on the lower side of normal; the patient reports poor po      intake. We will administer IV fluids for intravascular fluid      repletion. At this time  of the patient's blood pressure has been      persistently in the upper 90s-100s systolic and mean arterial      pressure above 65.  There is no reason to think that the low blood      pressure is secondary to sepsis, hemorrhage or cardiac disease      (with no fever or white count, good Hgb, previous neg coronary      artery cath and normal EF on the last 2Decho) but we will continue      to monitor, repeat a 2Decho and a cortisol level.  7. Prophylaxis.  The patient will receive Lovenox for DVT prophylaxis.      Vanetta Mulders, MD  Electronically Signed     DA/MEDQ  D:  08/25/2008  T:  08/26/2008  Job:  [id number]

## 2010-12-16 NOTE — Assessment & Plan Note (Signed)
[facility]                          [facility]ICE NOTE   [name], [name]                      MRN:          [id number]  DATE:02/25/2007                            DOB:          [birth year]    CARDIOLOGY OFFICE NOTE   PRIMARY CARE PHYSICIAN:  Madelin Rear. [name], M.D.   CARDIOLOGIST:  She is new to [name].   HISTORY OF PRESENT ILLNESS:  [name] is a 35 year old female patient  who returns to the office today for followup.  Please see my note from  January 24, 2007.  At that time, we saw her for syncope.  She had been  previously worked up by [name].  She had normal coronary  arteries by catheterization and previously documented normal left  ventricular function.  On exam we detected a venous hum, and we were  somewhat concerned about persistent PDA.  We set her up for numerous  tests.  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  There was questionable effusion of  the left and non-coronary cusp, and functional bicuspid aortic valve  could not be excluded.  Her 30-day event monitor showed no dysrhythmias.  Her laboratory data revealed persistent anemia with hemoglobin of 10.5,  MCV of 95.  White count was low at 4,300.  Potassium 3.1.  PFTs were  normal.  Thyroid ultrasound was normal, except for some benign cysts  bilaterally.  Her apnea link was negative for sleep apnea.  Her carotids  showed no plaquing, but velocities put her in the 40-59% category  bilaterally.   She returns today for followup.  We reviewed her tests in detail.  She  denies any recurrent episodes of syncope.  She continues to complain of  fatigue.   MEDICATIONS:  1. Nexium 40 mg daily.  2. Zantac 75 mg 3 tablets a day.  3. Klonopin 2 mg p.r.n.  4. Fioricet p.r.n.  5. Lortab p.r.n.   ALLERGIES:  No known drug allergies.   SOCIAL HISTORY:  She continues to smoke cigarettes.   PHYSICAL  EXAM:  She is a thin-appearing female in no acute distress.  Blood pressure is 105/67, pulse 61, weight 121 pounds.  HEENT:  Normal.  NECK:  Without JVD.  LYMPHATICS:  Without lymphadenopathy.  CARDIAC:  Normal S1, S2.  Regular rate and rhythm.  LUNGS:  Clear to auscultation bilaterally.  ABDOMEN:  Soft and nontender.  EXTREMITIES:  Without edema.  NEUROLOGIC:  She is alert and oriented x3.  Cranial nerves 2-12 grossly  intact.   IMPRESSION:  1. Unexplained syncope.  2. Palpitations.      a.     Normal 30-day event monitor recently.  3. Fatigue.  4. Normal coronary arteries by catheterization.  5. Questionable functional bicuspid aortic valve.  6. Good left ventricular function, ejection fraction of 50-55%.  7. Fibromyalgia.  8. Degenerative disk disease.  9. Depression/anxiety.  10.Gastroesophageal reflux disease.  11.Normocytic anemia, chronic.  12.Right neck venous hum.      a.     Probable anatomic variant.  13.Bilateral internal carotid artery stenosis by velocity only at 40-      59%.  14.Hypokalemia.   PLAN:  The patient returns today for followup.  As noted above, she had  a fairly unremarkable workup.  There was no evidence of persistent PDA  on her echocardiogram.  Her apnea link was negative for sleep apnea.  Her laboratory data was fairly unremarkable except for a persistent  anemia.  She was noted to be hypokalemic.  I have recommended increasing  her dietary potassium.  We will recheck a BMET in a week.  She does have  velocity ranges in the bilateral carotid arteries at 40-59%.  We will  pursue risk factor modification.  I have recommended a baby aspirin a  day.  I have also recommended a lipid panel.  I suspect she will need  Statin therapy, but we will await the results of her lipid panel.  She  does have an appointment pending with [name] next week.  I think it  is important that she see [name] for further workup of her  unexplained syncope.  I have  advised her to do no driving until she is  cleared by [name].  We will have her come back in followup in the  next 6 months.  Of note, I suspect that she may have a degree of  postural orthostatic tachycardic syndrome.  I have recommended that she  increase  her fluid intake as well as her electrolyte intake.  We will await  further recommendations per [name].      Sabrina Newcomer, PA-C  Electronically Signed      Sabrina Codding, MD,FACC  Electronically Signed   SW/MedQ  DD: 02/25/2007  DT: 02/25/2007  Job #: [id number]   cc:   Madelin Rear. [name], MD  Duke Salvia, MD, [facility]

## 2010-12-16 NOTE — H&P (Signed)
NAME:  [name], GRIMSLEY NO.:  [id number]   MEDICAL RECORD NO.:  [id number]         PATIENT TYPE:  PAMB   LOCATION:  DAY                           FACILITY:  [facility]   PHYSICIAN:  R. Roetta Sessions, M.D. DATE OF BIRTH:  [birth year]   DATE OF ADMISSION:  DATE OF DISCHARGE:  LH                              HISTORY & PHYSICAL   PRIMARY CARE PHYSICIAN:  __________   CHIEF COMPLAINT:  Abdominal pain, postprandial bloating, hematochezia,  and dysphagia.   HISTORY OF PRESENT ILLNESS:  [name] is a 35 year old Caucasian  female.  She was last seen by Dr. Jena Gauss on December 01, 2006.  She has  history of GERD and proctalgia.  She was having some solid food  dysphagia and underwent a barium pill esophagram on Dec 02, 2006 ordered  by Dr. Jena Gauss.  She has found to have unremarkable esophagram except for  the fact that the GE unction just barely accommodated the barium tablet.  There was no definite stricture.  She continues to have solid food  dysphagia.  She also notes odynophagia.  She complains of nausea.  She  feels as though her food gets stuck and points retrosternally to her mid  sternum.  She denies any vomiting.  She complains of severe bloating  after eating.  She has history of IBS.  She has pain with defecation.  She complains of left lower quadrant pain that is usually worsened just  prior to and during the bowel movement somewhat decreased, but never  completely goes away.  She has 2-3 bowel movement at least every 2-3  days.  She has lost a total of 17 pounds since she was evaluated 16  months ago.  Since she was initially evaluated 16 months ago, she tells  me she has not decreased her caloric intake.  She is on Prevacid 30 mg  daily.  She complains of chronic fatigue and she has fibromyalgia.  She  did have a negative gallbladder workup per her report previously through  she believes to either Dr. [name] or Dr. Ardell Isaacs office.   PAST MEDICAL AND SURGICAL HISTORY:   She has history of erosive reflux  esophagitis on EGD by Dr. Russella Dar, May 13, 2004.  She had a  colonoscopy which revealed a hyperplastic polyp in 2002 by Dr. Russella Dar.  She  had an abdominal ultrasound in October 2005, which was normal.  She  has chronic back pain and on chronic narcotic therapy through Dr.  Dennie Bible office and pain management office in [place].  She has  history of anxiety, depression, fibromyalgia, chronic fatigue syndrome,  irritable bowel syndrome.  She has had a C-section, partial  hysterectomy, tonsillectomy, a benign heart catheterization, and left  elbow surgery.   CURRENT MEDICATIONS:  1. Hydrocodone 7.5/500 mg q.i.d.  2. Fioricet p.r.n.  3. Clonazepam 2 mg t.i.d.  4. Prevacid 30 mg daily.  5. __________ daily.   ALLERGIES:  No known drug allergies.   FAMILY HISTORY:  There is no known family history of colorectal  carcinoma.  There is a family history of both mother and father with  adenomatous polyps.  Mother has diabetes mellitus and hypertension.  She  has one healthy brother.   SOCIAL HISTORY:  Ms. Ertl is married.  She has two children.  She is  unemployed.  She has a 6-pack year history of tobacco use.  Denies any  alcohol or drug use.   REVIEW OF SYSTEMS:  See HPI, otherwise negative.   PHYSICAL EXAMINATION:  VITAL SIGNS:  Weight is 117, height 66 inches,  temperature 98.2, blood pressure 102/70, pulse 80.  GENERAL:  [name] is a 35 year old Caucasian female who is alert,  pleasant, co-operative, and in no acute distress.  HEENT:  Clear, nonicteric, conjunctivae pink.  Oropharynx pink and moist  without any lesions.  NECK:  Supple without any mass or thyromegaly.  CHEST:  Heart regular rate and rhythm.  Normal S1 and S2.  No murmurs,  clicks, rubs, or gallops.  LUNGS:  Clear to auscultation bilaterally.  ABDOMEN:  Positive bowel sounds x4.  No bruits auscultated.  She does  have an umbilical ornament.  There is no rebound tenderness  or guarding.  No hepatosplenomegaly or masses.  EXTREMITIES:  Without clubbing or edema.  SKIN:  Pink, warm, and dry without any rash or jaundice.   LABORATORY STUDIES:  She had a transabdominal and transvaginal  ultrasound of the pelvis, which showed complex left adnexal lesion 1.7 x  1.6 x 1.5 cm, suspect hemorrhagic cyst.   IMPRESSION:  [name] is a 35 year old Caucasian female with several  GI problems today.  The first being a history of intermittent solid food  dysphagia and odynophagia with history of erosive reflux esophagitis and  possible narrowing at the esophagogastric junction evidenced on last  barium pill esophagram as above.  This warrants further evaluation to  rule out complicated gastroesophageal reflux disease as well as  esophageal web ring or stricture.   She also has intermittent hematochezia with a history of irritable bowel  syndrome.  I suspect this could be due to benign anorectal source,  however given her significant postprandial abdominal pain, intermittent  diarrhea alternating with constipation and blood in her stool.  I feel  she needs further evaluation to rule out inflammatory bowel disease as  the culprit  She has also had a significant weight loss in the last year  as well.  We should also rule out celiac and I believe she tell me  thyroid disease has already been ruled out.  Previous gallbladder workup  was benign, however biliary etiology should remain in the differential  as her workup has been several years ago.   PLAN:  1. CBC, LFTs, MET-7, __________ antibody panel.  2. Continue Prevacid 30 mg daily.  3. Align once daily for bloating.  We have given her samples today.  4. Colonoscopy and EGD with possible esophageal dilatation by Dr.      Jena Gauss in the near future.  I discuss procedure and had a risk and      benefits including but not limited to any infection, perforation,      drug reaction.  She agrees as planned consent will be  obtained.      Lorenza Burton, N.P.      Jonathon Bellows, M.D.  Electronically Signed    KJ/MEDQ  D:  04/26/2008  T:  04/27/2008  Job:  [id number]   cc:   Madelin Rear. [name], MD  Fax: [phone]

## 2010-12-17 ENCOUNTER — Other Ambulatory Visit (HOSPITAL_COMMUNITY): Payer: Self-pay | Admitting: Family Medicine

## 2010-12-17 DIAGNOSIS — M76899 Other specified enthesopathies of unspecified lower limb, excluding foot: Secondary | ICD-10-CM

## 2010-12-18 ENCOUNTER — Ambulatory Visit (HOSPITAL_COMMUNITY)
Admission: RE | Admit: 2010-12-18 | Discharge: 2010-12-18 | Disposition: A | Discharge status: Home / Self Care | Payer: Medicare Other | Source: Ambulatory Visit | Attending: Family Medicine | Admitting: Family Medicine

## 2010-12-18 DIAGNOSIS — M76899 Other specified enthesopathies of unspecified lower limb, excluding foot: Secondary | ICD-10-CM

## 2010-12-18 DIAGNOSIS — M25469 Effusion, unspecified knee: Secondary | ICD-10-CM | POA: Insufficient documentation

## 2010-12-18 DIAGNOSIS — M7120 Synovial cyst of popliteal space [Baker], unspecified knee: Secondary | ICD-10-CM | POA: Insufficient documentation

## 2010-12-18 DIAGNOSIS — M25569 Pain in unspecified knee: Secondary | ICD-10-CM | POA: Insufficient documentation

## 2011-01-15 ENCOUNTER — Ambulatory Visit (INDEPENDENT_AMBULATORY_CARE_PROVIDER_SITE_OTHER): Payer: Medicare Other | Admitting: Cardiology

## 2011-01-15 ENCOUNTER — Encounter: Payer: Self-pay | Admitting: Cardiology

## 2011-01-15 VITALS — BP 95/71 | HR 116 | Wt 111.0 lb

## 2011-01-15 DIAGNOSIS — R002 Palpitations: Secondary | ICD-10-CM

## 2011-01-15 DIAGNOSIS — F17200 Nicotine dependence, unspecified, uncomplicated: Secondary | ICD-10-CM

## 2011-01-15 DIAGNOSIS — I951 Orthostatic hypotension: Secondary | ICD-10-CM | POA: Insufficient documentation

## 2011-01-15 DIAGNOSIS — R000 Tachycardia, unspecified: Secondary | ICD-10-CM

## 2011-01-15 DIAGNOSIS — F3289 Other specified depressive episodes: Secondary | ICD-10-CM

## 2011-01-15 DIAGNOSIS — G90A Postural orthostatic tachycardia syndrome (POTS): Secondary | ICD-10-CM

## 2011-01-15 DIAGNOSIS — F329 Major depressive disorder, single episode, unspecified: Secondary | ICD-10-CM

## 2011-01-15 MED ORDER — FLUDROCORTISONE ACETATE 0.1 MG PO TABS: 0.1000 mg | ORAL_TABLET | Freq: Every day | ORAL | Status: DC

## 2011-01-15 NOTE — Patient Instructions (Addendum)
**Note De-Identified Jaquail Mclees Obfuscation** Your physician has recommended you make the following change in your medication: Start taking Florinef 0.1mg  daily (per Dr. Diona Browner, if this is not effective we will increase dose to twice a day)  Your physician recommends that you drink at least 6 to 8 glasses of water everyday  Your physician recommends that you schedule a follow-up appointment in: 3 months

## 2011-01-15 NOTE — Assessment & Plan Note (Signed)
Also generalized anxiety suspected. This may well play a major role in her symptom complex. I encouraged her to keep regular followup with behavioral health.

## 2011-01-15 NOTE — Progress Notes (Signed)
Clinical Summary [name] is a 35 y.o.female presenting for followup. She was a no show for her visit earlier this morning, and walked into the office this afternoon, tearful, demanding to be seen. She was added onto my schedule.  History as noted below. I last saw her in September 2011, and she no-showed for the subsequent followup visit. Florinef was added at the last visit for the possibility of postural orthostatic tachycardia syndrome. She is not on the medicine today, and it is not entirely clear how long she was on at in speaking with her today. She states that she thinks she took it for a month. She is not certain if it helped her symptoms.  She has undergone multiple tests over the years, including a cardiac catheterization demonstrating normal coronaries, a 30-day event recorder demonstrating no significant cardiac dysrhythmias, a 2-D echocardiography demonstrating a left ventricular ejection fraction of 50-55% with possibly a functionally bicuspid aortic valve, carotid Dopplers demonstrating no significant atherosclerotic plaque but increased velocities, normal pulmonary function tests, and a normal apnea link monitor. Also negative tilt table study with Dr. Lynnea Ferrier of [facility].  She has a multitude of somatic complaints. States that people keep telling her that she is "depressed." She seems to be searching for a single answer that explains all of her symptoms. I asked her if she was following with a behavioral health provider and she states that she does see a psychiatrist every 3 months.  I reviewed her cardiac testing over the last several years and discussed the results with her, tried to reassure her mainly. She is noted to be mildly orthostatic today. We have already discussed the possibility of POTS, importance of maintaining adequate hydration, and continuing a longer course of Florinef. I do suspect that depression and/or underlying anxiety, may be a big  component to consider, although did offer to make a referral for a formal EP consultation if Florinef is not effective.   No Known Allergies  Current outpatient prescriptions:amphetamine-dextroamphetamine (ADDERALL, 20MG ,) 20 MG tablet, Take 20 mg by mouth daily.  , Disp: , Rfl: ;  butabarbital (BUTISOL) 30 MG tablet, Take 30 mg by mouth 3 (three) times daily as needed.  , Disp: , Rfl: ;  clonazePAM (KLONOPIN) 2 MG tablet, Take 2 mg by mouth 4 (four) times daily.  , Disp: , Rfl: ;  esomeprazole (NEXIUM) 40 MG capsule, Take 40 mg by mouth 2 (two) times daily.  , Disp: , Rfl:  fludrocortisone (FLORINEF) 0.1 MG tablet, Take 1 tablet (0.1 mg total) by mouth daily., Disp: 30 tablet, Rfl: 3;  FLUoxetine (PROZAC) 20 MG capsule, Take 20 mg by mouth daily.  , Disp: , Rfl: ;  flurazepam (DALMANE) 30 MG capsule, Take 30 mg by mouth at bedtime as needed.  , Disp: , Rfl: ;  Hydrocodone-Acetaminophen 10-325 MG/15ML SOLN, Take 1 tablet by mouth as needed.  , Disp: , Rfl:  DISCONTD: fludrocortisone (FLORINEF) 0.1 MG tablet, Take 0.1 mg by mouth daily.  , Disp: , Rfl:   Past Medical History  Diagnosis Date  . Fibromyalgia   . Anxiety   . Depression   . Degenerative disc disease     Spinal, small syrinx  . Chronic pain     Dr. Wynn Banker  . GERD (gastroesophageal reflux disease)   . Esophageal stricture     Status post dilatation  . Syncope     Negative tilt table test  . Palpitations     No documented arrhythmias  Past Surgical History  Procedure Date  . Cesarean section   . Partial hysterectomy   . Tonsillectomy   . Left elbow surgery     Family History  Problem Relation Age of Onset  . Diabetes type II Mother   . Diabetes type II Brother     Social History [name] reports that she has been smoking Cigarettes.  She has never used smokeless tobacco. [name] reports that she does not drink alcohol.  Review of Systems Complains of "swollen lymph nodes," skin changes, knee pain,  anxiety, palpitations, intermittent sharp chest pain, fatigue, headaches, difficulty with short-term memory, anhedonia. No suicidal ideation.   Physical Examination Filed Vitals:   01/15/11 1521  BP: 95/71  Pulse: 116  Thin woman, no acute distress.  HEENT: Conjunctiva and lids normal, oropharynx with poor dentition.  Neck: Supple, no elevated JVP or bruits, no thyromegaly or thyroid tenderness.  Lungs: Clear to auscultation, nonlabored.  Cardiac: Regular rate and rhythm, no S3 is apparent, no pericardial rub.  Abdomen: Soft, nontender, bowel sounds present.  Extremities: No pitting edema, distal pulses 2+.  Skin: Warm and dry.  Musculoskeletal: No kyphosis.  Neuropsychiatric: Patient alert and oriented x3, tangential historian, tearful at times, slow speech.    ECG Sinus rhythm at 76, PR interval 104 ms.    Problem List and Plan

## 2011-01-15 NOTE — Assessment & Plan Note (Signed)
Possible diagnosis, as discussed previously. To large degree her cardiac testing has been reassuring over the years, including a reportedly normal tilt table study. She does have evidence of mild orthostasis today. We discussed adequate hydration, and I encouraged her to be compliant with her Florinef use for a long enough period of time to see whether this makes a difference. We could consider advancing the dose, or alternatively referring her for an EP consultation. I made it clear to her however, that this is not an explanation for her multiple somatic complaints.

## 2011-01-15 NOTE — Assessment & Plan Note (Signed)
Smoking cessation has been recommended.  

## 2011-01-19 ENCOUNTER — Encounter: Payer: Self-pay | Admitting: Cardiology

## 2011-02-10 ENCOUNTER — Encounter: Payer: Medicare Other | Attending: Neurosurgery | Admitting: Neurosurgery

## 2011-02-10 DIAGNOSIS — G8929 Other chronic pain: Secondary | ICD-10-CM | POA: Insufficient documentation

## 2011-02-10 DIAGNOSIS — M48061 Spinal stenosis, lumbar region without neurogenic claudication: Secondary | ICD-10-CM | POA: Insufficient documentation

## 2011-02-10 DIAGNOSIS — M5430 Sciatica, unspecified side: Secondary | ICD-10-CM

## 2011-02-10 DIAGNOSIS — M47817 Spondylosis without myelopathy or radiculopathy, lumbosacral region: Secondary | ICD-10-CM | POA: Insufficient documentation

## 2011-02-10 DIAGNOSIS — IMO0001 Reserved for inherently not codable concepts without codable children: Secondary | ICD-10-CM

## 2011-02-10 DIAGNOSIS — G894 Chronic pain syndrome: Secondary | ICD-10-CM

## 2011-02-11 NOTE — Assessment & Plan Note (Signed)
This is a patient of Dr. Wynn Banker seen for chronic low back pain.  She has been evaluated by Dr. Tressie Stalker of [facility] with MRI and he has recommended surgery, but left it to her.  She states she is terrified to have surgery, but she may consider injections prior to any surgical intervention and if gets worse and bad enough to consider.  Her pain is a constant burning, sharp pain.  She has pain in her shoulders and arms as well as her low back.  General activity level is about an 8.  Her pain level is about an 8.  All activities aggravate. Rest, heat, and medication tend to help.  MOBILITY:  She does not drive or climb steps.  She walks without assistance.  REVIEW OF SYSTEMS:  Notable for difficulties as described above, otherwise within normal limits.  PAST MEDICAL HISTORY:  Unchanged.  SOCIAL HISTORY:  Unchanged.  She is married.  FAMILY HISTORY:  Unchanged.  PHYSICAL EXAMINATION:  Blood pressure is 110/70, pulse 90, respirations 16, and O2 sats 100 on room air.  Oswestry score today is 68%.  Her motor strength 5/5 in the lower extremities including iliopsoas and quadriceps.  Her sensation is intact.  Constitutionally, she is very thin.  She is alert and oriented x3.  She has normal gait.  ASSESSMENT: 1. Fibromyalgia. 2. Chronic pain. 3. Lumbar spine spondylosis and stenosis.  Surgical intervention     possible with Dr. Tressie Stalker of [facility].  PLAN:  We refilled hydrocodone 10/325 two t.i.d., #90 with no refill and we are going to start her on Mobic 15 mg 1 p.o. daily, #30 with 3 refills.  We will see how she is done with this.  Her questions were encouraged and answered.  We will see her back in the clinic in 1 month.     Kip Kautzman L. Blima Dessert Electronically Signed    RLW/MedQ D:  02/10/2011 14:15:05  T:  02/11/2011 02:46:25  Job #:  [id number]

## 2011-03-05 ENCOUNTER — Ambulatory Visit: Payer: Medicare Other | Attending: Physical Medicine & Rehabilitation | Admitting: Physical Therapy

## 2011-03-05 DIAGNOSIS — M2550 Pain in unspecified joint: Secondary | ICD-10-CM | POA: Insufficient documentation

## 2011-03-05 DIAGNOSIS — IMO0001 Reserved for inherently not codable concepts without codable children: Secondary | ICD-10-CM | POA: Insufficient documentation

## 2011-03-10 ENCOUNTER — Encounter: Payer: Medicare Other | Attending: Neurosurgery | Admitting: Neurosurgery

## 2011-03-10 ENCOUNTER — Ambulatory Visit: Payer: Medicare Other | Admitting: Neurosurgery

## 2011-03-26 ENCOUNTER — Encounter: Payer: Self-pay | Admitting: Gastroenterology

## 2011-03-26 ENCOUNTER — Telehealth: Payer: Self-pay | Admitting: *Deleted

## 2011-03-26 NOTE — Telephone Encounter (Signed)
Pt states she has no improvement from her hypotension bp on florinef 0.1mg  dialy 101/75, asked pt to increase to twice daily as per Dr. Ival Bible office note. Pt has no relief per her statements.

## 2011-03-30 ENCOUNTER — Telehealth: Payer: Self-pay | Admitting: Cardiology

## 2011-03-30 NOTE — Telephone Encounter (Signed)
Pt called second time stated pass out cold she stated. She fell hit wall and husband found her laying in floor. Pt said it has happen once before. Pt said she has some more medical problems and she wants to talk with a MD today. Pt stated she feels terrible and at noon. Pt was advised to go to ER to be seen at this time.Pt said she would go to ER, after she got children together and with family to attend to.

## 2011-03-30 NOTE — Telephone Encounter (Signed)
Pt called wanted to talk to nurse dizziness &n thyroids concerns. Pt has awaken today feeling very tired & was told this was coming from her thyroid levels are abnormal from PCP office. Pt is a asking if someone can give her a call back. Reason is that PCP is on vacation and ,Dr. Diona Browner is her other provider.

## 2011-03-30 NOTE — Telephone Encounter (Signed)
Pt stated that dr Diona Browner has changed her medications recently. She passed out today and has not been feeling like her self.

## 2011-03-31 ENCOUNTER — Emergency Department (HOSPITAL_COMMUNITY)
Admission: EM | Admit: 2011-03-31 | Discharge: 2011-04-01 | Disposition: A | Discharge status: Home / Self Care | Payer: Medicare Other | Attending: Emergency Medicine | Admitting: Emergency Medicine

## 2011-03-31 ENCOUNTER — Encounter: Payer: Medicare Other | Attending: Neurosurgery | Admitting: Neurosurgery

## 2011-03-31 DIAGNOSIS — R002 Palpitations: Secondary | ICD-10-CM | POA: Insufficient documentation

## 2011-03-31 DIAGNOSIS — M47817 Spondylosis without myelopathy or radiculopathy, lumbosacral region: Secondary | ICD-10-CM | POA: Insufficient documentation

## 2011-03-31 DIAGNOSIS — G8929 Other chronic pain: Secondary | ICD-10-CM | POA: Insufficient documentation

## 2011-03-31 DIAGNOSIS — IMO0001 Reserved for inherently not codable concepts without codable children: Secondary | ICD-10-CM | POA: Insufficient documentation

## 2011-03-31 DIAGNOSIS — R55 Syncope and collapse: Secondary | ICD-10-CM | POA: Insufficient documentation

## 2011-03-31 DIAGNOSIS — Z79899 Other long term (current) drug therapy: Secondary | ICD-10-CM | POA: Insufficient documentation

## 2011-03-31 DIAGNOSIS — K219 Gastro-esophageal reflux disease without esophagitis: Secondary | ICD-10-CM | POA: Insufficient documentation

## 2011-03-31 DIAGNOSIS — R000 Tachycardia, unspecified: Secondary | ICD-10-CM | POA: Insufficient documentation

## 2011-03-31 DIAGNOSIS — R079 Chest pain, unspecified: Secondary | ICD-10-CM | POA: Insufficient documentation

## 2011-03-31 DIAGNOSIS — M545 Low back pain, unspecified: Secondary | ICD-10-CM | POA: Insufficient documentation

## 2011-03-31 DIAGNOSIS — F341 Dysthymic disorder: Secondary | ICD-10-CM | POA: Insufficient documentation

## 2011-03-31 DIAGNOSIS — G894 Chronic pain syndrome: Secondary | ICD-10-CM

## 2011-03-31 DIAGNOSIS — M549 Dorsalgia, unspecified: Secondary | ICD-10-CM | POA: Insufficient documentation

## 2011-03-31 NOTE — Telephone Encounter (Signed)
Telephone encounters reviewed in retrospect.  I was in the [place] office on the day that this was sent. Please refer to my note from June with discussion of her history. Only medication added by [place] recently has been Florinef to see if this might help with possible symptomatic orthostasis and possible history of POTS, although her testing over the years has been largely unremarkable from a cardiac perspective. I note that it was recommended for the patient to go to the ER for evaluation.

## 2011-03-31 NOTE — Assessment & Plan Note (Signed)
ACCOUNT:  [id number].  This is a patient of Dr. Wynn Banker' seen for chronic low back pain.  She has also been evaluated by Dr. Lovell Sheehan at [facility], who did recommend surgery which she has not followed through with.  The patient tells me today that she had a fainting spell yesterday, it was not syncope.  She does have some cardiac issues and she is being worked up by Dr. Diona Browner for that.  She may be referred to a vascular specialists as well.  Regarding her back, she rates her pain as 6-8, it is unchanged, it is a sharp, burning, stabbing pain with tingling and aching, it is intermittent.  General activity level was 7-8.  Pain is worse during the daytime and the evening.  Sleep patterns are fair.  All activities aggravate.  Medication, TENS, rest, heat therapy tend to help.  She walks without assistance.  She can climb steps and drive. She can walk about 10 minutes at a time.  She is on disability.  REVIEW OF SYSTEMS:  Notable for difficulties described above, otherwise within normal limits.  Her Oswestry score is 68.  PAST MEDICAL HISTORY:  Unchanged.  SOCIAL HISTORY:  She is married, lives with her husband.  FAMILY HISTORY:  Unchanged.  PHYSICAL EXAMINATION:  Her blood pressure 107/69, pulse 76, respirations 18, O2 sats 100 on room air.  Motor strength is good in the lower extremities.  Sensation is intact.  Constitutionally, she is thin.  She is alert and oriented x3.  She appears somewhat depressed.  Her gait is normal.  ASSESSMENT: 1. Fibromyalgia. 2. Chronic pain. 3. Lumbar spondylosis stenosis.  PLAN: 1. Refill hydrocodone 10/325 two p.o. t.i.d. 180 with no refill. 2. She will follow up here in 1 month.  Her questions were encouraged     and answered.     Wadell Craddock L. Blima Dessert Electronically Signed    RLW/MedQ D:  03/31/2011 11:42:03  T:  03/31/2011 18:04:30  Job #:  [id number]

## 2011-04-01 ENCOUNTER — Emergency Department (HOSPITAL_COMMUNITY): Payer: Medicare Other

## 2011-04-01 LAB — DIFFERENTIAL
Eosinophils Absolute: 0.2 10*3/uL (ref 0.0–0.7)
Eosinophils Relative: 3 % (ref 0–5)
Lymphocytes Relative: 47 % — ABNORMAL HIGH (ref 12–46)
Lymphs Abs: 3 10*3/uL (ref 0.7–4.0)
Monocytes Absolute: 0.4 10*3/uL (ref 0.1–1.0)

## 2011-04-01 LAB — COMPREHENSIVE METABOLIC PANEL
Albumin: 4 g/dL (ref 3.5–5.2)
Alkaline Phosphatase: 44 U/L (ref 39–117)
BUN: 13 mg/dL (ref 6–23)
Calcium: 8.9 mg/dL (ref 8.4–10.5)
GFR calc Af Amer: >60 mL/min (ref >60)
Glucose, Bld: 70 mg/dL (ref 70–99)
Potassium: 3.3 mEq/L — ABNORMAL LOW (ref 3.5–5.1)
Total Protein: 6.6 g/dL (ref 6.0–8.3)

## 2011-04-01 LAB — CBC
HCT: 35.1 % — ABNORMAL LOW (ref 36.0–46.0)
MCH: 32.7 pg (ref 26.0–34.0)
MCHC: 34.2 g/dL (ref 30.0–36.0)
MCV: 95.6 fL (ref 78.0–100.0)
RDW: 12.6 % (ref 11.5–15.5)

## 2011-04-01 LAB — CK TOTAL AND CKMB (NOT AT ARMC)
CK, MB: 1.5 ng/mL (ref 0.3–4.0)
Relative Index: INVALID (ref 0.0–2.5)
Total CK: 72 U/L (ref 7–177)

## 2011-04-01 LAB — TROPONIN I: Troponin I: <0.3 ng/mL (ref <0.30)

## 2011-04-15 ENCOUNTER — Encounter: Payer: Self-pay | Admitting: *Deleted

## 2011-04-17 ENCOUNTER — Ambulatory Visit: Payer: Medicare Other | Admitting: Adult Health

## 2011-04-20 ENCOUNTER — Encounter: Payer: Self-pay | Admitting: Gastroenterology

## 2011-04-20 ENCOUNTER — Encounter: Payer: Self-pay | Admitting: Adult Health

## 2011-04-20 ENCOUNTER — Ambulatory Visit (INDEPENDENT_AMBULATORY_CARE_PROVIDER_SITE_OTHER): Payer: Medicare Other | Admitting: Gastroenterology

## 2011-04-20 VITALS — BP 90/60 | HR 89 | Ht 66.0 in | Wt 112.0 lb

## 2011-04-20 DIAGNOSIS — K589 Irritable bowel syndrome without diarrhea: Secondary | ICD-10-CM

## 2011-04-20 DIAGNOSIS — K219 Gastro-esophageal reflux disease without esophagitis: Secondary | ICD-10-CM

## 2011-04-20 DIAGNOSIS — R634 Abnormal weight loss: Secondary | ICD-10-CM

## 2011-04-20 NOTE — Patient Instructions (Addendum)
Please go directly to the basement to have your stool test done. Low gas diet given. Use Gas-X four times a day as needed.  Start Miralax mixing 17 grams in 8 oz of water 1-3 x daily titrating depending on constipation. Follow up with your Primary Care Physician and Psychiatrist.  cc: Elfredia Nevins, MD

## 2011-04-20 NOTE — Progress Notes (Signed)
History of Present Illness: This is a 35 year old female seen in the past who returns for evaluation of abdominal pain, GERD, constipation and weight loss. She has a long history of irritable bowel syndrome and GERD. I evaluated her in the 1997 and saw her on a few occasions until 2005. She had recurrent problems with abdominal pain, gas, bloating and alternating bowel habits. Her reflux symptoms were difficult to control. In addition she has underlying anxiety and depression. She switched GI physicians to Dr. Jena Gauss in [place]. She underwent upper endoscopy and colonoscopy by Dr. Jena Gauss in October 2009 as well as an esophageal dilation. The upper endoscopy was normal and the colonoscopy was normal. Duodenal biopsies were normal. Dr. Jena Gauss suspected the majority of her gastrointestinal symptoms were due to functional problems. She states she has decreased taste, decreased appetite and has lost weight. Her bowel habits which tended to be variable in the past have been predominantly constipation for the past several months. She is not using any laxatives on a consistent basis. She notes ongoing problems with gas and lower abdominal pain primarily in the right lower quadrant. She underwent a CT scan of the abdomen and pelvis on 09/02/2010 which was unremarkable. She was seen in the emergency department on 04/01/11 for syncope and blood work was obtained that was unremarkable. She denies dysphagia, odynophagia, melena, hematochezia, chest pain.   Past Medical History  Diagnosis Date  . Fibromyalgia   . Anxiety   . Depression   . Degenerative disc disease     Spinal, small syrinx  . Chronic pain     Dr. Wynn Banker  . GERD (gastroesophageal reflux disease)   . Esophageal stricture     Status post dilatation  . Syncope     Negative tilt table test  . Palpitations     No documented arrhythmias  . Hiatal hernia   . Internal hemorrhoids without mention of complication   . Personal history of colonic polyps  10/04/2000    HYPERPLASTIC POLYP  . Irritable bowel syndrome    Past Surgical History  Procedure Date  . Cesarean section   . Partial hysterectomy   . Tonsillectomy   . Left elbow surgery     reports that she has been smoking Cigarettes.  She has never used smokeless tobacco. She reports that she does not drink alcohol or use illicit drugs. family history includes Alcohol abuse in an unspecified family member; Colon polyps in her mother; Diabetes type II in her brother and mother; Diverticulitis in her mother; Ovarian cancer in an unspecified family member; and Uterine cancer in an unspecified family member. No Known Allergies  Outpatient Encounter Prescriptions as of 04/20/2011  Medication Sig Dispense Refill  . amphetamine-dextroamphetamine (ADDERALL, 20MG ,) 20 MG tablet Take 20 mg by mouth daily.        . butabarbital (BUTISOL) 30 MG tablet Take 30 mg by mouth 3 (three) times daily as needed.        . clonazePAM (KLONOPIN) 2 MG tablet Take 2 mg by mouth 4 (four) times daily.        Marland Kitchen esomeprazole (NEXIUM) 40 MG capsule Take 40 mg by mouth 2 (two) times daily.        . fludrocortisone (FLORINEF) 0.1 MG tablet Take 0.1 mg by mouth 2 (two) times daily.        Marland Kitchen FLUoxetine (PROZAC) 20 MG capsule Take 20 mg by mouth daily.        . Hydrocodone-Acetaminophen 10-325 MG/15ML SOLN Take 1  tablet by mouth as needed.         Review of Systems: Anxiety, back pain, blood in urine, effusion and memory loss, vision changes, depression, extreme fatigue, headaches, muscle cramps, shortness of breath, increased sleeping, swollen lymph glands or voice changes Pertinent positive and negative review of systems were noted in the above HPI section. All other review of systems were otherwise negative.  Physical Exam: General: Well developed , well nourished, no acute distress Head: Normocephalic and atraumatic Eyes:  sclerae anicteric, EOMI Ears: Normal auditory acuity Mouth: No deformity or lesions Neck:  Supple, no masses or thyromegaly Lungs: Clear throughout to auscultation Heart: Regular rate and rhythm; no murmurs, rubs or bruits Abdomen: Soft, non tender and non distended. No masses, hepatosplenomegaly or hernias noted. Normal Bowel sounds Rectal: No lesions Hemoccult negative brown stool in the vault Musculoskeletal: Symmetrical with no gross deformities  Skin: No lesions on visible extremities Pulses:  Normal pulses noted Extremities: No clubbing, cyanosis, edema or deformities noted Neurological: Alert oriented x 4 Cervical Nodes:  No significant cervical adenopathy Inguinal Nodes: No significant inguinal adenopathy Psychological:  Alert and cooperative.Slow responses. Flat affect. Difficulty recalling details. Appears to have difficulty concentrating.  Assessment and Recommendations:  1. Irritable bowel syndrome with gas, bloating, abdominal pain and constipation. One component of her abdominal pain may be related to fibromyalgia. Her constipation may be exacerbated by side effects from hydrocodone. Abdominal and pelvic CT scan in January did not show any GI pathology. She has had a colonoscopy and upper endoscopy in 05/2008 by Dr. Jena Gauss. Will obtain a H. pylori fecal antigen as the patient is concerned she may have H. pylori. Her symptoms do not match symptoms typical for H. pylori. No need for further gastrointestinal evaluation at this time. She is advised to use MiraLax titrated between 1 and 3 times each day for adequate bowel movements and to begin Gas-X qid prn and a low gas diet. She appears active mental health issues that need further attention and they are likely contributing to her gastrointestinal complaints. I have asked her to return to her primary physician and psychiatrist for further evaluation on ongoing treatment.  2. GERD. Symptoms are under good control on current regimen of Nexium twice daily. Continue this along with standard antireflux measures.  3. Anxiety and  depression. As in problem #1.  4. Anorexia and weight loss. Rule out active mental health issues, medications side effects or other non-GI problems causing these complaints. I have asked her to return to her primary physician and psychiatrist.

## 2011-04-21 ENCOUNTER — Encounter: Payer: Self-pay | Admitting: Gastroenterology

## 2011-04-23 ENCOUNTER — Ambulatory Visit: Payer: Medicare Other | Admitting: Adult Health

## 2011-04-24 ENCOUNTER — Ambulatory Visit (INDEPENDENT_AMBULATORY_CARE_PROVIDER_SITE_OTHER): Payer: Medicare Other | Admitting: Adult Health

## 2011-04-24 ENCOUNTER — Encounter: Payer: Self-pay | Admitting: Cardiology

## 2011-04-24 ENCOUNTER — Encounter: Payer: Self-pay | Admitting: Adult Health

## 2011-04-24 VITALS — BP 123/74 | HR 115 | Resp 18 | Ht 66.0 in | Wt 110.2 lb

## 2011-04-24 DIAGNOSIS — G90A Postural orthostatic tachycardia syndrome (POTS): Secondary | ICD-10-CM

## 2011-04-24 DIAGNOSIS — R000 Tachycardia, unspecified: Secondary | ICD-10-CM

## 2011-04-24 DIAGNOSIS — I951 Orthostatic hypotension: Secondary | ICD-10-CM

## 2011-04-24 MED ORDER — POTASSIUM CHLORIDE ER 10 MEQ PO TBCR: 10.0000 meq | EXTENDED_RELEASE_TABLET | Freq: Two times a day (BID) | ORAL | Status: DC

## 2011-04-24 NOTE — Progress Notes (Signed)
HPI:  [name] is a anxious, tearful patient of Dr. Diona Browner we are seeing on follow-up with known history of POTS, ADHD, depression, who presents to office today for continued complaints of syncope, palpitations. She is very slow in speech, thought and has poor memory. She states that she is often having racing heart rate, and continues to pass out on occasion. She is not always compliant with florinef dosing as directed.  She insists that something is wrong with her heart and is wanting to have more tests to find out why this keeps happening. She is crying and talks about not feeling like herself and being a good mom to her kids. She is followed by a psychiatrist, Dr. Mitzi Hansen, in [place], but does not seek therapy in between Q 3 month visits with him. She wants another carotid study and a referral to EP. She states she is drinking plenty of water. She continues to smoke and remain anxious all day.  Review of Dr. Ival Bible extensive note shows that she has had numerous tests over the years, including a cardiac catheterization demonstrating normal coronaries, a 30-day event recorder demonstrating no significant cardiac dysrhythmias, a 2-D echocardiography demonstrating a left ventricular ejection fraction of 50-55% with possibly a functionally bicuspid aortic valve, carotid Dopplers demonstrating no significant atherosclerotic plaque but increased velocities, normal pulmonary function tests, and a normal apnea link monitor. Also negative tilt table study with Dr. Lynnea Ferrier of [facility].  She has had Florinef dosing adjusted and reassurance given on multiple visits. She is angry and frustrated that we have not helped her. She is followed by Dr. [name] who has sent her to endocrinologist and she is unhappy with that physician as well. No Known Allergies  Current Outpatient Prescriptions  Medication Sig Dispense Refill  . amphetamine-dextroamphetamine (ADDERALL, 20MG ,) 20 MG tablet  Take 20 mg by mouth daily.        . butabarbital (BUTISOL) 30 MG tablet Take 30 mg by mouth 3 (three) times daily as needed.        . clonazePAM (KLONOPIN) 2 MG tablet Take 2 mg by mouth 4 (four) times daily.        Marland Kitchen esomeprazole (NEXIUM) 40 MG capsule Take 40 mg by mouth 2 (two) times daily.        . fludrocortisone (FLORINEF) 0.1 MG tablet Take 0.1 mg by mouth 2 (two) times daily.        Marland Kitchen FLUoxetine (PROZAC) 20 MG capsule Take 20 mg by mouth daily.        . Hydrocodone-Acetaminophen 10-325 MG/15ML SOLN Take 1 tablet by mouth as needed.        . potassium chloride (K-DUR) 10 MEQ tablet Take 1 tablet (10 mEq total) by mouth 2 (two) times daily.  60 tablet  6    Past Medical History  Diagnosis Date  . Fibromyalgia   . Anxiety   . Depression   . Degenerative disc disease     Spinal, small syrinx  . Chronic pain     Dr. Wynn Banker  . GERD (gastroesophageal reflux disease)   . Esophageal stricture     Status post dilatation  . Syncope     Negative tilt table test  . Palpitations     No documented arrhythmias  . Hiatal hernia   . Internal hemorrhoids without mention of complication   . Personal history of colonic polyps 10/04/2000    HYPERPLASTIC POLYP  . Irritable bowel syndrome     Past Surgical  History  Procedure Date  . Cesarean section   . Partial hysterectomy   . Tonsillectomy   . Left elbow surgery     EAV:[id number] of systems complete and found to be negative unless listed above PHYSICAL EXAM BP 123/74  Pulse 115  Resp 18  Ht 5\' 6"  (1.676 m)  Wt 110 lb 3.2 oz (49.986 kg)  BMI 17.79 kg/m2  SpO2 95%  General: Well developed, well nourished, in no acute distress Head: Eyes PERRLA, No xanthomas.   Normal cephalic and atramatic  Lungs: Clear bilaterally to auscultation and percussion. Heart: HRRR S1 S2,.  Pulses are 2+ & equal.            No carotid bruit. No JVD.  No abdominal bruits. No femoral bruits. Abdomen: Bowel sounds are positive, abdomen soft and non-tender  without masses or                  Hernia's noted. Msk:  Back normal, normal gait. Normal strength and tone for age. Extremities: No clubbing, cyanosis or edema.  DP +1 Neuro: Alert and oriented X 3. Psych:  Tearful, anxious, angry, responds appropriately  :  ASSESSMENT AND PLAN

## 2011-04-24 NOTE — Assessment & Plan Note (Signed)
She continues symptomatic despite Florinef and fluid intake. She states that she is compliant with medications but admits to not taking her dose today.  I do not feel further cardiac intervention or testing is needed in this unfortunate women with multiple somatic complaints and obvious mental health issues.  I reviewed the recent labs and have noticed that she is mildly hypokalemic.  I have given her Rx for micro K 10 mEq to take daily. She is to follow-up with Dr. Diona Browner. At his discretion, may consider EP evaluation. I do not wish to refer her at this time as I am not certain that compliance with medication regimen, along with anxiety issues may not be contributing to her symptoms.  l

## 2011-04-24 NOTE — Patient Instructions (Signed)
Your physician recommends that you schedule a follow-up appointment in: 1 month Your physician has recommended you make the following change in your medication: START Potassium 10 mEq twice daily PLEASE TAKE ALL MEDS AS DIRECTED

## 2011-04-27 ENCOUNTER — Ambulatory Visit: Payer: Medicare Other | Admitting: Cardiology

## 2011-04-27 ENCOUNTER — Encounter: Payer: Medicare Other | Attending: Neurosurgery

## 2011-04-27 ENCOUNTER — Ambulatory Visit: Payer: Medicare Other | Admitting: Physical Medicine & Rehabilitation

## 2011-04-27 DIAGNOSIS — M47817 Spondylosis without myelopathy or radiculopathy, lumbosacral region: Secondary | ICD-10-CM | POA: Insufficient documentation

## 2011-04-27 DIAGNOSIS — IMO0001 Reserved for inherently not codable concepts without codable children: Secondary | ICD-10-CM | POA: Insufficient documentation

## 2011-04-27 DIAGNOSIS — M47812 Spondylosis without myelopathy or radiculopathy, cervical region: Secondary | ICD-10-CM

## 2011-04-27 DIAGNOSIS — M51379 Other intervertebral disc degeneration, lumbosacral region without mention of lumbar back pain or lower extremity pain: Secondary | ICD-10-CM

## 2011-04-27 DIAGNOSIS — M5137 Other intervertebral disc degeneration, lumbosacral region: Secondary | ICD-10-CM

## 2011-04-27 DIAGNOSIS — M48061 Spinal stenosis, lumbar region without neurogenic claudication: Secondary | ICD-10-CM | POA: Insufficient documentation

## 2011-04-27 DIAGNOSIS — G8929 Other chronic pain: Secondary | ICD-10-CM | POA: Insufficient documentation

## 2011-04-27 DIAGNOSIS — G894 Chronic pain syndrome: Secondary | ICD-10-CM

## 2011-04-27 NOTE — Assessment & Plan Note (Signed)
REASON FOR VISIT:  Pain throughout the body sometimes in the upper, sometimes in the lower.  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  She also has a history of fibromyalgia.  She has been able to maintain her independence with her self-care and mobility with the hydrocodone 10/325 two p.o. t.i.d.  She has had no new medication-related problems.  She has no diarrhea or constipation noted.  She has difficulty with walking at times, numbness, tremor, tingling, sometimes confusion "fibro fog."  Average pain is 6 to 7, which is more or less at baseline.  Intercurrent medical problems nothing significant.  Blood pressure 100/60, pulse 68, respirations 18, and O2 sat 100% on room air.  PHYSICAL EXAMINATION:  Tenderness.  Fibromyalgia tender points 8.  These are suboccipital upper trap, upper medial scapular border and low back.  Straight leg raising test is negative.  Deep tendon reflexes are normal.  The patient also complaining of snapping hips.  Hip range of motion is full.  No pain with range of motion.  No tenderness over the greater trochanters.  IMPRESSION: 1. Fibromyalgia syndrome. 2. Thoracic syrinx.  No signs of neurologic compromise. 3. Chronic pain syndrome. 4. Snapping hips.  Instructed in internal and external rotation,     stretching.  PLAN:  Continue current medications.  Follow up with mid-level 3 months. I will see her back in 6 months.     Erick Colace, M.D. Electronically Signed    AEK/MedQ D:  04/27/2011 10:34:00  T:  04/27/2011 10:56:09  Job #:  [id number]

## 2011-04-28 ENCOUNTER — Ambulatory Visit: Payer: Medicare Other | Admitting: Physical Medicine & Rehabilitation

## 2011-04-28 ENCOUNTER — Telehealth: Payer: Self-pay

## 2011-04-28 NOTE — Telephone Encounter (Signed)
I have left a message for the patient to call back about h pylori stool antigen that she has not returned.

## 2011-04-29 NOTE — Telephone Encounter (Signed)
Multiple messages left for the patient.  She has not returned the calls.  Per Dr Russella Dar ok to cancel stool studies

## 2011-05-20 ENCOUNTER — Encounter: Payer: Self-pay | Admitting: Cardiology

## 2011-05-25 ENCOUNTER — Ambulatory Visit: Payer: Medicare Other | Admitting: Cardiology

## 2011-07-20 ENCOUNTER — Encounter: Payer: Medicare Other | Attending: Neurosurgery | Admitting: Neurosurgery

## 2011-07-20 DIAGNOSIS — IMO0001 Reserved for inherently not codable concepts without codable children: Secondary | ICD-10-CM | POA: Insufficient documentation

## 2011-07-20 DIAGNOSIS — G894 Chronic pain syndrome: Secondary | ICD-10-CM

## 2011-07-20 DIAGNOSIS — M539 Dorsopathy, unspecified: Secondary | ICD-10-CM | POA: Insufficient documentation

## 2011-07-20 DIAGNOSIS — G8929 Other chronic pain: Secondary | ICD-10-CM | POA: Insufficient documentation

## 2011-07-21 NOTE — Assessment & Plan Note (Signed)
This is a patient of Dr. Wynn Banker' seen for extreme upper body pain and fibromyalgia.  The patient states she has had some numbness in the right temporal to occipital area from time to time.  She has a history of syrinx that was not operated on.  She states Dr. Lovell Sheehan at [facility] did want to do this surgery, but she was basically afraid.  She rates her average pain as 5 to an 8.  It is a sharp, burning, stabbing, tingling, and aching pain.  General activity level is 7 to an 8.  Pain is worse during the morning, evening, and night.  Sleep patterns are fair.  All activities aggravate.  Rest, heat, and medication help.  She walks without assistance.  She can climb steps and drive.  She is on disability.  She needs help with ADLs.  REVIEW OF SYSTEMS:  Notable for difficulties as described above as well as some weakness, paresthesias, spasms, dizziness, depression, anxiety. She does have history of night sweats.  No suicidal thoughts or aberrant behaviors.  Last cocaine and UDS consistent.  PAST MEDICAL HISTORY/SOCIAL HISTORY/FAMILY HISTORY:  Unchanged.  PHYSICAL EXAMINATION:  Blood pressure 114/77, pulse 114, respirations 16, and O2 sats 93 on room air.  Motor strength and sensation intact. Constitutionally, she is very thin.  She is alert and oriented x3.  She has a normal gait.  IMPRESSION: 1. Fibromyalgia. 2. History of thoracic syrinx. 3. Chronic pain.  PLAN: 1. Refill Robaxin 500 mg 1 p.o. b.i.d., #60 with 3 refills. 2. Hydrocodone 10/325 two p.o. t.i.d., #180 with no refills. 3. I have referred her back to Dr. Tressie Stalker for evaluation due     to the condition and numbness she is having in her head.  She     agrees.  She will call and make that appointment, so she is already     established there.  Her questions otherwise were encouraged and     answered.  I will see her in a month.     Pride Gonzales L. Blima Dessert Electronically Signed    RLW/MedQ D:  07/20/2011  14:56:33  T:  07/21/2011 07:41:58  Job #:  [id number]

## 2011-09-03 ENCOUNTER — Ambulatory Visit (INDEPENDENT_AMBULATORY_CARE_PROVIDER_SITE_OTHER): Payer: Medicare Other | Admitting: Cardiology

## 2011-09-03 ENCOUNTER — Encounter: Payer: Self-pay | Admitting: Cardiology

## 2011-09-03 VITALS — BP 99/68 | HR 76 | Ht 66.0 in | Wt <= 1120 oz

## 2011-09-03 DIAGNOSIS — R55 Syncope and collapse: Secondary | ICD-10-CM

## 2011-09-03 DIAGNOSIS — E876 Hypokalemia: Secondary | ICD-10-CM

## 2011-09-03 NOTE — Assessment & Plan Note (Signed)
Recurring over years with substantial workup already undertaken, seen by a variety of different providers over time. Frankly, I have made it clear that I do not have a definite unifying diagnosis for all of her symptoms. She has been found to be orthostatic over time, and the possibility of autonomic dysfunction or POTS is to be considered. I have had her on Florinef, and I also suggested that she could be seen for EP consultation in the past. She and her husband were both here today, requesting EP referral. We spent at least 20 minutes discussing her symptoms, reviewing her previous workup, and I plan to have her seen by Dr. Graciela Husbands for evaluation and managment. ECGs have also been reviewed over time including at my last visit. QT prolongation has been described by computer interpretation, however this looks to be incorrect based on careful calculation of QTc, and I think that prolonged QT syndrome is not the case. For now would continue Florinef. Plan to followup BMET in light of continued use of potassium supplements, as I doubt that these will be necessary going forward.

## 2011-09-03 NOTE — Progress Notes (Signed)
Clinical Summary [name] is a 36 y.o.female presenting for followup. She was last seen by Ms. Lawrence in September 2012. I reviewed the previous note. She was placed on potassium supplements at that time, potassium level was 3.3. I see no followup lab work.  She has had inconsistent follow up with me for history of recurring syncope and a largely negative prior workup. She has undergone multiple tests over the years, including a cardiac catheterization demonstrating normal coronaries, a 30-day event recorder demonstrating no significant cardiac dysrhythmias, a 2-D echocardiography demonstrating a left ventricular ejection fraction of 50-55% with possibly a functionally bicuspid aortic valve, carotid Dopplers demonstrating no significant atherosclerotic plaque but increased velocities, normal pulmonary function tests, and a normal apnea link monitor. Also negative tilt table study with Dr. Lynnea Ferrier of [facility].  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 We have had her on Florinef, stressing consistent use, also adequate hydration, and being careful with sudden postural changes.  Today she presents with her husband. She voiced several frustrations reading from a piece of paper, describing her symptoms in detail. I listened to her today, and answered her questions as best I could. I also reviewed the results of her previous evaluation, and my recommendation from June 2012 that we could consider referring her for EP consultation. She does have a number of other issues to consider, including possibility of psychiatric illness, and she states that she continues to follow with a psychiatrist.  She states that she feels that she is being "brushed off," citing her last interaction with Ms. Lawrence. We  discussed her symptoms, and I again made it clear that I did not have a definite answer, although was certainly willing to refer her for EP consultation to see if this might provide any further certainty or management strategies as it relates to possible autonomic dysfunction or POTS. She voiced satisfaction with this discussion.   No Known Allergies  Current Outpatient Prescriptions  Medication Sig Dispense Refill  . amphetamine-dextroamphetamine (ADDERALL, 20MG ,) 20 MG tablet Take 20 mg by mouth daily.        Marland Kitchen aspirin EC 81 MG tablet Take 81 mg by mouth daily. When she remembers.      . butabarbital (BUTISOL) 30 MG tablet Take 30 mg by mouth 3 (three) times daily as needed.        . clonazePAM (KLONOPIN) 2 MG tablet Take 2 mg by mouth 4 (four) times daily.        Marland Kitchen esomeprazole (NEXIUM) 40 MG capsule Take 40 mg by mouth 2 (two) times daily.        . fludrocortisone (FLORINEF) 0.1 MG tablet Take 0.1 mg by mouth 2 (two) times daily.        Marland Kitchen FLUoxetine (PROZAC) 20 MG capsule Take 20 mg by mouth daily.        . Hydrocodone-Acetaminophen 10-325 MG/15ML SOLN Take 1 tablet by mouth as needed.        . potassium chloride (K-DUR) 10 MEQ tablet Take 1 tablet (10 mEq total) by mouth 2 (two) times daily.  60 tablet  6    Past Medical History  Diagnosis Date  . Fibromyalgia   . Anxiety   . Depression   . Degenerative disc disease     Spinal, small syrinx  . Chronic pain  Dr. Wynn Banker  . GERD (gastroesophageal reflux disease)   . Esophageal stricture     Status post dilatation  . Syncope     Negative tilt table test  . Palpitations     No documented arrhythmias  . Hiatal hernia   . Internal hemorrhoids without mention of complication   . Personal history of colonic polyps 10/04/2000    HYPERPLASTIC POLYP  . Irritable bowel syndrome     Past Surgical History  Procedure Date  . Cesarean section   . Partial hysterectomy   . Tonsillectomy   . Left elbow surgery     Family History   Problem Relation Age of Onset  . Diabetes type II Mother   . Diabetes type II Brother   . Colon polyps Mother   . Diverticulitis Mother   . Uterine cancer    . Ovarian cancer    . Alcohol abuse      Social History Ms. Lejeune reports that she quit smoking about 2 months ago. Her smoking use included Cigarettes. She has never used smokeless tobacco. Ms. Hemrick reports that she does not drink alcohol.  Review of Systems States that she has had interval follow up with an endocrinologist regarding "thyroid cysts." She also continues to take potassium supplements. She reports headaches, fatigue, recurring syncope, sense of rapid heart rate when she is standing, chest pains, anxiety and frustration.  Physical Examination Filed Vitals:   09/03/11 1048  BP: 99/68  Pulse: 76   Thin woman, in no acute distress.  HEENT: Conjunctiva and lids normal, oropharynx with poor dentition.  Neck: Supple, no elevated JVP or bruits, no thyromegaly or thyroid tenderness.  Lungs: Clear to auscultation, nonlabored.  Cardiac: Regular rate and rhythm, no S3 is apparent, no pericardial rub.  Abdomen: Soft, nontender, bowel sounds present.  Extremities: No pitting edema, distal pulses 2+.  Skin: Warm and dry.  Musculoskeletal: No kyphosis.  Neuropsychiatric: Patient alert and oriented x3, calm.    Problem List and Plan

## 2011-09-03 NOTE — Patient Instructions (Signed)
   New Patient visit with Dr. Graciela Husbands - will call patient back with time Your physician recommends that you go to the [facility] for lab work for [employer]. If the results of your test are normal or stable, you will receive a letter.  If they are abnormal, the nurse will contact you by phone. Follow up as needed

## 2011-09-07 ENCOUNTER — Telehealth: Payer: Self-pay | Admitting: *Deleted

## 2011-09-07 NOTE — Telephone Encounter (Signed)
Pt notified of results and verbalized understanding  

## 2011-09-07 NOTE — Telephone Encounter (Signed)
Message copied by Arlyss Gandy on Mon Sep 07, 2011  4:16 PM ------      Message from: Murriel Hopper      Created: Mon Sep 07, 2011  3:57 PM                   ----- Message -----         From: Jonelle Sidle, MD         Sent: 09/04/2011  10:35 AM           To: Hoover Brunette, LPN            Reviewed. Renal function normal. Potassium also normal at 4.6. Would discontinue regular potassium supplementation for now.

## 2011-09-24 ENCOUNTER — Other Ambulatory Visit: Payer: Self-pay | Admitting: *Deleted

## 2011-09-24 MED ORDER — HYDROCODONE-ACETAMINOPHEN 10-325 MG PO TABS: 2.0000 | ORAL_TABLET | Freq: Three times a day (TID) | ORAL | Status: DC

## 2011-09-25 ENCOUNTER — Institutional Professional Consult (permissible substitution): Payer: Medicare Other | Admitting: Internal Medicine

## 2011-10-16 ENCOUNTER — Ambulatory Visit: Payer: Medicare Other | Admitting: Physical Medicine & Rehabilitation

## 2011-10-19 ENCOUNTER — Ambulatory Visit: Payer: Medicare Other | Admitting: Physical Medicine & Rehabilitation

## 2011-10-20 ENCOUNTER — Institutional Professional Consult (permissible substitution): Payer: Medicare Other | Admitting: Internal Medicine

## 2011-10-26 ENCOUNTER — Ambulatory Visit: Payer: Medicare Other | Admitting: Physical Medicine & Rehabilitation

## 2011-10-26 ENCOUNTER — Encounter: Payer: Self-pay | Admitting: Physical Medicine & Rehabilitation

## 2011-10-26 ENCOUNTER — Ambulatory Visit (HOSPITAL_BASED_OUTPATIENT_CLINIC_OR_DEPARTMENT_OTHER): Payer: Medicare Other | Admitting: Physical Medicine & Rehabilitation

## 2011-10-26 ENCOUNTER — Encounter: Payer: Medicare Other | Attending: Physical Medicine & Rehabilitation

## 2011-10-26 VITALS — BP 101/71 | HR 81 | Resp 16 | Ht 66.0 in | Wt 117.8 lb

## 2011-10-26 DIAGNOSIS — M5137 Other intervertebral disc degeneration, lumbosacral region: Secondary | ICD-10-CM | POA: Insufficient documentation

## 2011-10-26 DIAGNOSIS — M51379 Other intervertebral disc degeneration, lumbosacral region without mention of lumbar back pain or lower extremity pain: Secondary | ICD-10-CM | POA: Insufficient documentation

## 2011-10-26 DIAGNOSIS — IMO0001 Reserved for inherently not codable concepts without codable children: Secondary | ICD-10-CM

## 2011-10-26 DIAGNOSIS — M47812 Spondylosis without myelopathy or radiculopathy, cervical region: Secondary | ICD-10-CM | POA: Insufficient documentation

## 2011-10-26 DIAGNOSIS — I951 Orthostatic hypotension: Secondary | ICD-10-CM | POA: Insufficient documentation

## 2011-10-26 DIAGNOSIS — F3289 Other specified depressive episodes: Secondary | ICD-10-CM | POA: Insufficient documentation

## 2011-10-26 DIAGNOSIS — F411 Generalized anxiety disorder: Secondary | ICD-10-CM | POA: Insufficient documentation

## 2011-10-26 DIAGNOSIS — F329 Major depressive disorder, single episode, unspecified: Secondary | ICD-10-CM | POA: Insufficient documentation

## 2011-10-26 DIAGNOSIS — G950 Syringomyelia and syringobulbia: Secondary | ICD-10-CM

## 2011-10-26 DIAGNOSIS — M797 Fibromyalgia: Secondary | ICD-10-CM

## 2011-10-26 DIAGNOSIS — M519 Unspecified thoracic, thoracolumbar and lumbosacral intervertebral disc disorder: Secondary | ICD-10-CM

## 2011-10-26 MED ORDER — HYDROCODONE-ACETAMINOPHEN 10-325 MG PO TABS: 2.0000 | ORAL_TABLET | Freq: Three times a day (TID) | ORAL | Status: DC | PRN

## 2011-10-26 NOTE — Patient Instructions (Addendum)
Syringomyelia Syringomyelia is a disorder in which a small pocket of fluid forms within the spinal cord. Such a collection of fluid is called a syrinx. The syrinx is in the center of the spinal cord and travels up and down the cord. It expands and elongates over time causing injury to the center of the spinal cord. CAUSES   In most cases, the disorder is related to an abnormality of the brain that the person is born with (congenital). This is a type I Chiari malformation. It causes the lower part of the brain (cerebellum) to protrude into the hole at the bottom of the skull where the spinal cord exits. This makes the space very tight. It is difficult for the cerebrospinal fluid that normally moves through that hole to get through. The obstruction of this flow can result in a syrinx.   Syringomyelia may occur as a complication of:   Injury (trauma).   Meningitis.   Severe bleeding (hemorrhage).   Tumor.   Brain membrane inflammation (arachnoiditis).  SYMPTOMS  Typically, signs of the disorder develop slowly. Symptoms depend on whether the syrinx is located in the top, middle, or bottom of the spinal cord. Each patient experiences a different combination of symptoms:  Headaches.   Loss of the ability to feel extremes of hot or cold. This is especially true in the hands if the syrinx is in the top of the spine.   Numbness and tingling.   Bowel and bladder dysfunction.   Curvature of the spine (scoliosis).   Pain.   Muscle spasms (spasticity).   With a large syrinx and significant injury to the top of the spinal cord, a patient may have difficulty breathing or become paralyzed in the limbs (quadriplegic).  DIAGNOSIS  History and physical examination are important in making this diagnosis. Once a syrinx is suspected, you will typically have magnetic resonance imaging (MRI) done. MRI has increased the number of cases diagnosed in the beginning stages of this disorder. TREATMENT    Surgery is the only treatment, but not all patients progress to the point of needing surgery. If the patient has symptoms and those symptoms progress, surgery is recommended. If a patient has symptoms and is not treated surgically, this disorder can lead to progressive weakness in the arms and legs, loss of hand sensation, and chronic, severe pain.  Surgery is usually recommended if you have symptoms or if there is a lesion, such as a tumor, causing the syrinx. If the disorder recurs after surgery, additional operations may be needed. These may not be completely successful over the long term.   In some patients, it may be necessary to drain the syrinx. This can be done by using a catheter, drainage tubes, and valves.   If the syrinx is associated with a Chiari malformation, the patient may need a procedure that relieves pressure from the brain (suboccipital decompression) to fix the problem at the base of the skull.   If there are no symptoms, this disorder is usually not treated. Also, in patients of advanced age or in cases where there is no progression of symptoms, your caregiver may recommend not treating the condition.  Document Released: 07/10/2002 Document Revised: 07/09/2011 Document Reviewed: 10/16/2009 [facility] Patient Information 2012 [place], [place].

## 2011-10-26 NOTE — Progress Notes (Signed)
  Subjective:    Patient ID: [name], female    DOB: 08-26-1975, 36 y.o.   MRN: [id number]  HPI Patient is getting additional workup for autonomic neuropathy which is causing orthostatic hypotension. No new neurosurgical interventions planned. Continues to see psychiatry. Pain Inventory Average Pain 8 Pain Right Now 8 My pain is intermittent, sharp, burning, dull, stabbing, tingling and aching  In the last 24 hours, has pain interfered with the following? General activity 8 Relation with others 8 Enjoyment of life 8 What TIME of day is your pain at its worst? morning and evening Sleep (in general) Fair  Pain is worse with: walking, bending, sitting, inactivity, standing, unsure and some activites Pain improves with: rest, heat/ice, medication and TENS Relief from Meds: 6  Mobility walk without assistance how many minutes can you walk? 10 ability to climb steps?  yes do you drive?  yes  Function disabled: date disabled 2009 I need assistance with the following:  meal prep, household duties and shopping  Neuro/Psych weakness numbness  Prior Studies Any changes since last visit?  no  Physicians involved in your care Any changes since last visit?  yes Primary care Dr [name] Dr Janace Hoard cardiology- will be seeing Dr Clide Cliff for Electrophysiology Studies for "orthostatic intolerance"      Review of Systems  Constitutional: Positive for diaphoresis.       Night sweats  Cardiovascular:       Experiencing orthostatic "intolerance "  Musculoskeletal: Positive for myalgias.  Neurological: Positive for dizziness, weakness, light-headedness and numbness.  All other systems reviewed and are negative.       Objective:   Physical Exam  Constitutional: She appears well-developed.  HENT:  Head: Normocephalic and atraumatic.  Eyes: Conjunctivae and EOM are normal. Pupils are equal, round, and reactive to light.  Neck: Normal range of motion.    Pulmonary/Chest: Effort normal.  Neurological: She has normal strength. Gait normal.  Reflex Scores:      Tricep reflexes are 1+ on the right side and 1+ on the left side.      Bicep reflexes are 1+ on the right side and 1+ on the left side.      Brachioradialis reflexes are 1+ on the right side and 1+ on the left side.      Patellar reflexes are 1+ on the right side and 1+ on the left side.      Achilles reflexes are 1+ on the right side and 1+ on the left side. Psychiatric: Her speech is normal and behavior is normal. Judgment and thought content normal. Her mood appears anxious. Cognition and memory are normal.          Assessment & Plan:  1. Lumbar degenerative disc with pain amplified by fibromyalgia syndrome.  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 Depression and anxiety seen by psychiatry  5. Orthostatic hypotension probable autonomic neuropathy but still undergoing further workup.

## 2011-11-10 ENCOUNTER — Encounter: Payer: Self-pay | Admitting: Internal Medicine

## 2011-11-10 ENCOUNTER — Ambulatory Visit (INDEPENDENT_AMBULATORY_CARE_PROVIDER_SITE_OTHER): Payer: Medicare Other | Admitting: Internal Medicine

## 2011-11-10 VITALS — BP 107/74 | HR 140 | Ht 66.0 in | Wt 114.2 lb

## 2011-11-10 DIAGNOSIS — G90A Postural orthostatic tachycardia syndrome (POTS): Secondary | ICD-10-CM

## 2011-11-10 DIAGNOSIS — I951 Orthostatic hypotension: Secondary | ICD-10-CM

## 2011-11-10 DIAGNOSIS — R000 Tachycardia, unspecified: Secondary | ICD-10-CM

## 2011-11-10 NOTE — Progress Notes (Signed)
History and Physical  Patient ID: TEXAS OBORN MRN: [id number], SOB: 12/31/1975 35 y.o. Date of Encounter: 11/10/2011, 3:39 PM  Primary Physician: Cassell Smiles., MD, MD Primary Cardiologist: sm Primary Electrophysiologist:  sk  Chief Complaint: syncope  History of Present Illness: [name] is a 36 y.o. female seen at the request of Dr. Diona Browner because of recurrent syncope.  Her symptoms began 2006 and haave included a plethora of things including postural intolerance and tachycardia, aggravated by heat and showers.  She describes exercise intolerance with palpitations and chest tightness, position no headaches and an overwhelming fatigue. She's had a couple of syncopal episodes with frequently a prodrome of lightheadedness but not always.  Her urine is described as dark. She is try to increase her salt intake. She notes that when she is standing she is a purplish into her legs and her feet.   She also notes photosensitivity. She has a history of small syrinxes x3   The impact on her life has been disrupted to say the least both the relationships with her children as well as her husband. She is frequently tearful during our interview.  Her evaluation has included catheterization with normal coronaries, 30 day event recorder with no significant dysrhythmia, 2-D echo demonstrating normal left ventricular function and a probable bicuspid aortic valve;  by Dr. Lynnea Ferrier. she also apparently had a negative tilt table test.  Current function testing carotid studies were also normal as was the sleep study.  She has seen psychiatry in the past.  At her last visit with Dr. Diona Browner she expressed from her frustration   Past Medical History  Diagnosis Date  . Fibromyalgia   . Anxiety   . Depression   . Degenerative disc disease     Spinal, small syrinx  . Chronic pain     Dr. Wynn Banker  . GERD (gastroesophageal reflux disease)   . Esophageal stricture     Status post  dilatation  . Syncope     Negative tilt table test  . Palpitations     No documented arrhythmias  . Hiatal hernia   . Internal hemorrhoids without mention of complication   . Personal history of colonic polyps 10/04/2000    HYPERPLASTIC POLYP  . Irritable bowel syndrome      Past Surgical History  Procedure Date  . Cesarean section   . Partial hysterectomy   . Tonsillectomy   . Left elbow surgery       Current Outpatient Prescriptions  Medication Sig Dispense Refill  . amphetamine-dextroamphetamine (ADDERALL, 20MG ,) 20 MG tablet Take 20 mg by mouth daily.        Marland Kitchen aspirin EC 81 MG tablet Take 81 mg by mouth.       . butabarbital (BUTISOL) 30 MG tablet Take 30 mg by mouth 3 (three) times daily as needed.        . clonazePAM (KLONOPIN) 2 MG tablet Take 2 mg by mouth 4 (four) times daily.        Marland Kitchen esomeprazole (NEXIUM) 40 MG capsule Take 40 mg by mouth 2 (two) times daily.        . fludrocortisone (FLORINEF) 0.1 MG tablet Take 0.1 mg by mouth 2 (two) times daily.        Marland Kitchen HYDROcodone-acetaminophen (NORCO) 10-325 MG per tablet Take 2 tablets by mouth 3 (three) times daily.  180 tablet  0  . HYDROcodone-acetaminophen (NORCO) 10-325 MG per tablet Take 2 tablets by mouth every 8 (eight) hours as  needed for pain.  180 tablet  0  . meloxicam (MOBIC) 15 MG tablet Take 15 mg by mouth daily.         Allergies: No Known Allergies   History  Substance Use Topics  . Smoking status: Former Smoker    Types: Cigarettes    Quit date: 06/30/2011  . Smokeless tobacco: Never Used  . Alcohol Use: No      Family History  Problem Relation Age of Onset  . Diabetes type II Mother   . Colon polyps Mother   . Diverticulitis Mother   . Diabetes Mother   . Heart disease Mother   . Diabetes type II Brother   . Uterine cancer    . Ovarian cancer        ROS:  Please see the history of present illness. 2 All other systems reviewed and negative.   Vital Signs: Blood pressure 107/74, pulse  140, height 5\' 6"  (1.676 m), weight 114 lb 2.9 oz (51.792 kg).  PHYSICAL EXAM: General:  Well nourished, well developed female in no acute distress HEENT: normal Lymph: no adenopathy Neck: no JVD Endocrine:  No thryomegaly Vascular: No carotid bruits; FA pulses 2+ bilaterally without bruits Cardiac:  normal S1, S2; RRR; no murmur Back: without kyphosis/scoliosis, no CVA tenderness Lungs:  clear to auscultation bilaterally, no wheezing, rhonchi or rales Abd: soft, nontender, no hepatomegaly Ext: no edema Musculoskeletal:  No deformities, BUE and BLE strength normal and equal Skin: warm and dry Neuro:  CNs 2-12 intact, no focal abnormalities noted Psych:  Normal affect   EKG:  Sinus rhythm at 100 Intervals 0.12/0.08/0.34 Axis LXXVI Prominent T waves in lead 2  Labs:   Lab Results  Component Value Date   WBC 6.4 04/01/2011   HGB 12.0 04/01/2011   HCT 35.1* 04/01/2011   MCV 95.6 04/01/2011   PLT 196 04/01/2011   No results found for this basename: NA,K,CL,CO2,BUN,CREATININE,CALCIUM,LABALBU,PROT,BILITOT,ALKPHOS,ALT,AST,GLUCOSE in the last 168 hours No results found for this basename: CKTOTAL:4,CKMB:4,TROPONINI:4 in the last 72 hours No results found for this basename: CHOL, HDL, LDLCALC, TRIG   Lab Results  Component Value Date   DDIMER  Value: 0.31        AT THE INHOUSE ESTABLISHED CUTOFF VALUE OF 0.48 ug/mL FEU, THIS ASSAY HAS BEEN DOCUMENTED IN THE LITERATURE TO HAVE A SENSITIVITY AND NEGATIVE PREDICTIVE VALUE OF AT LEAST 98 TO 99%.  THE TEST RESULT SHOULD BE CORRELATED WITH AN ASSESSMENT OF THE CLINICAL PROBABILITY OF DVT / VTE. 08/25/2008   BNP No results found for this basename: probnp       ASSESSMENT AND PLAN:

## 2011-11-10 NOTE — Assessment & Plan Note (Signed)
She has many symptoms are consistent with a diagnosis of POTS.  We reviewed extensively the physiology of postural tachycardia and orthostatic intolerance importance of salt and water repletion the potential role of beta blockers particularly given the venous capacitance aspect of her syndrome as suggested by the purplish hue  Of her lower extremities.  We also discussed the interaction of psychosocial stress with her syndrome and addressed the difficulties for all in the family in dealing with someone who is disabled.  We spent more than an hour on the above discussions.  For now we will plan to 1  increase her volume intake #2-continue Florinef #3 2-D echo   #4 once her volume status is replete i.e. Urine is clear we'll undertake a 24-hour urine sodium collection 72 hours following the discontinuation of her Florinef #5*consider with her psychiatrist the discontinuation of her Adderall number #6 we'll see her again in 4-6 weeks time

## 2011-11-10 NOTE — Patient Instructions (Addendum)
Your physician has requested that you have an echocardiogram in [place]. Echocardiography is a painless test that uses sound waves to create images of your heart. It provides your doctor with information about the size and shape of your heart and how well your heart's chambers and valves are working. This procedure takes approximately one hour. There are no restrictions for this procedure.  Your physician recommends that you continue on your current medications as directed. Please refer to the Current Medication list given to you today.  Please talk to your physciatrist about the possibility of stopping your adderall.  Please increase your fluid intake.  Please review the POTS booklet given to you today for more information regarding your diagnosis.  Your physician recommends that you schedule a follow-up appointment in: 4-6 weeks with Dr. Graciela Husbands.

## 2011-11-16 ENCOUNTER — Ambulatory Visit (HOSPITAL_COMMUNITY)
Admission: RE | Admit: 2011-11-16 | Discharge: 2011-11-16 | Disposition: A | Discharge status: Home / Self Care | Payer: Medicare Other | Source: Ambulatory Visit | Attending: Internal Medicine | Admitting: Internal Medicine

## 2011-11-16 DIAGNOSIS — R000 Tachycardia, unspecified: Secondary | ICD-10-CM | POA: Insufficient documentation

## 2011-11-16 DIAGNOSIS — R55 Syncope and collapse: Secondary | ICD-10-CM | POA: Insufficient documentation

## 2011-11-16 DIAGNOSIS — G90A Postural orthostatic tachycardia syndrome (POTS): Secondary | ICD-10-CM

## 2011-11-16 DIAGNOSIS — I10 Essential (primary) hypertension: Secondary | ICD-10-CM | POA: Insufficient documentation

## 2011-11-16 DIAGNOSIS — I951 Orthostatic hypotension: Secondary | ICD-10-CM

## 2011-11-16 NOTE — Progress Notes (Signed)
*  PRELIMINARY RESULTS* Echocardiogram 2D Echocardiogram has been performed.  [name] 11/16/2011, 2:04 PM

## 2011-11-27 ENCOUNTER — Other Ambulatory Visit: Payer: Self-pay | Admitting: *Deleted

## 2011-11-27 ENCOUNTER — Telehealth: Payer: Self-pay | Admitting: Physical Medicine & Rehabilitation

## 2011-11-27 MED ORDER — HYDROCODONE-ACETAMINOPHEN 10-325 MG PO TABS: 2.0000 | ORAL_TABLET | Freq: Three times a day (TID) | ORAL | Status: DC | PRN

## 2011-11-27 NOTE — Telephone Encounter (Signed)
[facility] has been faxing 3 days for refill and has not heard anything back.  Will be out today.  Please call.

## 2011-11-27 NOTE — Telephone Encounter (Signed)
This was called in this morning to [facility].

## 2011-12-25 ENCOUNTER — Other Ambulatory Visit: Payer: Self-pay

## 2011-12-25 MED ORDER — HYDROCODONE-ACETAMINOPHEN 10-325 MG PO TABS: 2.0000 | ORAL_TABLET | Freq: Three times a day (TID) | ORAL | Status: DC | PRN

## 2011-12-29 ENCOUNTER — Encounter: Payer: Self-pay | Admitting: Internal Medicine

## 2011-12-29 ENCOUNTER — Ambulatory Visit (INDEPENDENT_AMBULATORY_CARE_PROVIDER_SITE_OTHER): Payer: Medicare Other | Admitting: Internal Medicine

## 2011-12-29 VITALS — BP 103/67 | HR 103 | Ht 66.5 in | Wt 116.1 lb

## 2011-12-29 DIAGNOSIS — F329 Major depressive disorder, single episode, unspecified: Secondary | ICD-10-CM

## 2011-12-29 DIAGNOSIS — I951 Orthostatic hypotension: Secondary | ICD-10-CM

## 2011-12-29 DIAGNOSIS — R000 Tachycardia, unspecified: Secondary | ICD-10-CM

## 2011-12-29 DIAGNOSIS — G90A Postural orthostatic tachycardia syndrome (POTS): Secondary | ICD-10-CM

## 2011-12-29 DIAGNOSIS — F3289 Other specified depressive episodes: Secondary | ICD-10-CM

## 2011-12-29 MED ORDER — NADOLOL 20 MG PO TABS: 20.0000 mg | ORAL_TABLET | Freq: Every day | ORAL | Status: DC

## 2011-12-29 MED ORDER — PROPRANOLOL HCL ER 60 MG PO CP24: 60.0000 mg | ORAL_CAPSULE | Freq: Every day | ORAL | Status: DC

## 2011-12-29 NOTE — Progress Notes (Signed)
  HPI  [name] is a 36 y.o. female Seen in followup of POTS with a history of postural intolerance and syncope. Echo>>normal LV function  Her volume status is improved with now clear urine. She is drinking Gatorade.  Family issues remain stressful with her daughters particularly.  She is tearful and this to depression.   Past Medical History  Diagnosis Date  . Fibromyalgia   . Anxiety   . Depression   . Degenerative disc disease     Spinal, small syrinx  . Chronic pain     Dr. Wynn Banker  . GERD (gastroesophageal reflux disease)   . Esophageal stricture     Status post dilatation  . Syncope     Negative tilt table test  . Palpitations     No documented arrhythmias  . Hiatal hernia   . Internal hemorrhoids without mention of complication   . Personal history of colonic polyps 10/04/2000    HYPERPLASTIC POLYP  . Irritable bowel syndrome     Past Surgical History  Procedure Date  . Cesarean section   . Partial hysterectomy   . Tonsillectomy   . Left elbow surgery     Current Outpatient Prescriptions  Medication Sig Dispense Refill  . amphetamine-dextroamphetamine (ADDERALL, 20MG ,) 20 MG tablet Take 20 mg by mouth daily.        Marland Kitchen aspirin EC 81 MG tablet Take 81 mg by mouth. When she remembers.       . butabarbital (BUTISOL) 30 MG tablet Take 30 mg by mouth 3 (three) times daily as needed.        . clonazePAM (KLONOPIN) 2 MG tablet Take 2 mg by mouth 4 (four) times daily.        Marland Kitchen esomeprazole (NEXIUM) 40 MG capsule Take 40 mg by mouth 2 (two) times daily.        . fludrocortisone (FLORINEF) 0.1 MG tablet Take 0.1 mg by mouth 2 (two) times daily.        Marland Kitchen HYDROcodone-acetaminophen (NORCO) 10-325 MG per tablet Take 2 tablets by mouth every 8 (eight) hours as needed for pain.  180 tablet  0  . DISCONTD: HYDROcodone-acetaminophen (NORCO) 10-325 MG per tablet Take 2 tablets by mouth 3 (three) times daily.  180 tablet  0    No Known Allergies  Review of Systems  negative except from HPI and PMH  Physical Exam BP 103/67  Pulse 103  Ht 5' 6.5" (1.689 m)  Wt 116 lb 1.9 oz (52.672 kg)  BMI 18.46 kg/m2 Well developed and well nourished in no acute distress HENT normal E scleral and icterus clear Neck Supple JVP flat; carotids brisk and full Clear to ausculation Regular rate and rhythm, no murmurs gallops or rub Soft with active bowel sounds No clubbing cyanosis none Edema Alert and oriented, grossly normal motor and sensory function Skin Warm and Dry; some venous discoloration Affect flat and tearful   Assessment and  Plan

## 2011-12-29 NOTE — Assessment & Plan Note (Signed)
I have encouraged him to pursue counseling is a couple and is a family to try to begin to deal with the collateral damage of chronic illness. We spent 30-35 minuyed discussing the issues

## 2011-12-29 NOTE — Patient Instructions (Addendum)
Your physician has recommended you make the following change in your medication:  1) Hold florinef for 2 weeks. - You are being given prescriptions or 2 different beta blockers to try in any order. DO NOT take more than one at a time. Please try them for at least 3 weeks. 2) Inderal LA 60 mg one capsule by mouth daily.  Your physician recommends that you have lab work today: tsh/cbc/24 hour urine - sodium  Your physician recommends that you schedule a follow-up appointment in: 6-8 weeks with Dr. Graciela Husbands.

## 2011-12-29 NOTE — Assessment & Plan Note (Signed)
The patient continues to manifest symptoms of this disorder. 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 I will try low-dose nonselective beta blockers with Inderal and Corgard. We will actually have her hold her Florinef for 2 weeks and then do a urine sodium excretion study.  Reviewing her laboratories her TSH and hemoglobin has not been checked for a long time. We will recheck them.

## 2011-12-30 LAB — CBC WITH DIFFERENTIAL/PLATELET
Basophils Absolute: 0 10*3/uL (ref 0.0–0.1)
Eosinophils Absolute: 0 10*3/uL (ref 0.0–0.7)
Lymphocytes Relative: 44.7 % (ref 12.0–46.0)
MCHC: 33.7 g/dL (ref 30.0–36.0)
Monocytes Relative: 7.9 % (ref 3.0–12.0)
Neutro Abs: 1.8 10*3/uL (ref 1.4–7.7)
Neutrophils Relative %: 46.1 % (ref 43.0–77.0)
Platelets: 174 10*3/uL (ref 150.0–400.0)
RDW: 12.6 % (ref 11.5–14.6)

## 2011-12-30 LAB — TSH: TSH: 0.5 u[IU]/mL (ref 0.35–5.50)

## 2012-01-01 ENCOUNTER — Other Ambulatory Visit: Payer: Medicare Other

## 2012-01-26 ENCOUNTER — Encounter: Payer: Medicare Other | Attending: Physical Medicine & Rehabilitation | Admitting: Physical Medicine and Rehabilitation

## 2012-02-01 ENCOUNTER — Encounter: Payer: Self-pay | Admitting: Physical Medicine and Rehabilitation

## 2012-02-01 ENCOUNTER — Encounter: Payer: Medicare Other | Attending: Physical Medicine & Rehabilitation | Admitting: Physical Medicine and Rehabilitation

## 2012-02-01 VITALS — BP 105/62 | HR 86 | Resp 16 | Ht 66.0 in | Wt 114.0 lb

## 2012-02-01 DIAGNOSIS — M545 Low back pain, unspecified: Secondary | ICD-10-CM

## 2012-02-01 DIAGNOSIS — M542 Cervicalgia: Secondary | ICD-10-CM | POA: Insufficient documentation

## 2012-02-01 DIAGNOSIS — G950 Syringomyelia and syringobulbia: Secondary | ICD-10-CM | POA: Insufficient documentation

## 2012-02-01 MED ORDER — HYDROCODONE-ACETAMINOPHEN 10-325 MG PO TABS: 2.0000 | ORAL_TABLET | Freq: Three times a day (TID) | ORAL | Status: DC | PRN

## 2012-02-01 NOTE — Patient Instructions (Signed)
Continue with stretching exercises, start a walking program .

## 2012-02-01 NOTE — Progress Notes (Deleted)
Subjective:    Patient ID: [name], female    DOB: 05-10-1976, 36 y.o.   MRN: [id number]  HPI The patient complains about chronic pain low back pain, which radiates into her abdomen on the left.  The patient also complains about intermittend numbness and tingling in her feet. The problem has been stable. The patient reports that she saw Dr. Lovell Sheehan the neurosurgeon, who recommended surgery for an annular tear of her L4-5 disc. The patient is not considering surgery at that time. Pain Inventory Average Pain 7 Pain Right Now 8 My pain is sharp, burning, dull, stabbing, tingling and aching  In the last 24 hours, has pain interfered with the following? General activity 8 Relation with others 8 Enjoyment of life 8 What TIME of day is your pain at its worst? Morning and Eveniing Sleep (in general) Poor  Pain is worse with: walking, bending, sitting, inactivity, standing, unsure and some activites Pain improves with: rest, heat/ice, medication and TENS Relief from Meds: 6  Mobility walk without assistance ability to climb steps?  yes do you drive?  yes  Function disabled: date disabled  I need assistance with the following:  meal prep, household duties and shopping  Neuro/Psych weakness numbness tremor tingling spasms dizziness confusion anxiety  Prior Studies Any changes since last visit?  no  Physicians involved in your care Any changes since last visit?  no   Family History  Problem Relation Age of Onset  . Diabetes type II Mother   . Colon polyps Mother   . Diverticulitis Mother   . Diabetes Mother   . Heart disease Mother   . Diabetes type II Brother   . Uterine cancer    . Ovarian cancer     History   Social History  . Marital Status: Married    Spouse Name: N/A    Number of Children: 2  . Years of Education: N/A   Occupational History  . Disability    Social History Main Topics  . Smoking status: Former Smoker    Types: Cigarettes   Quit date: 06/30/2011  . Smokeless tobacco: Never Used  . Alcohol Use: No  . Drug Use: No  . Sexually Active: None   Other Topics Concern  . None   Social History Narrative  . None   Past Surgical History  Procedure Date  . Cesarean section   . Partial hysterectomy   . Tonsillectomy   . Left elbow surgery    Past Medical History  Diagnosis Date  . Fibromyalgia   . Anxiety   . Depression   . Degenerative disc disease     Spinal, small syrinx  . Chronic pain     Dr. Wynn Banker  . GERD (gastroesophageal reflux disease)   . Esophageal stricture     Status post dilatation  . Syncope     Negative tilt table test  . Palpitations     No documented arrhythmias  . Hiatal hernia   . Internal hemorrhoids without mention of complication   . Personal history of colonic polyps 10/04/2000    HYPERPLASTIC POLYP  . Irritable bowel syndrome    BP 105/62  Pulse 86  Resp 16  Ht 5\' 6"  (1.676 m)  Wt 114 lb (51.71 kg)  BMI 18.40 kg/m2  SpO2 98%     Review of Systems  HENT: Negative.   Eyes: Negative.   Respiratory: Negative.   Cardiovascular: Negative.   Gastrointestinal: Negative.   Genitourinary: Negative.  Musculoskeletal: Positive for myalgias and arthralgias.  Neurological: Positive for dizziness, tremors, weakness and numbness.  Hematological: Negative.   Psychiatric/Behavioral: Negative.        Objective:   Physical Exam  Constitutional: She is oriented to person, place, and time. She appears well-developed and well-nourished.  HENT:  Head: Normocephalic.  Neck: Neck supple.  Musculoskeletal: She exhibits tenderness.  Neurological: She is alert and oriented to person, place, and time.  Skin: Skin is dry.  Psychiatric: She has a normal mood and affect.       Very slow thinking process, slow speech   Symmetric normal motor tone is noted throughout. Normal muscle bulk. Muscle testing reveals 5/5 muscle strength of the upper extremity, and 5/5 of the lower  extremity. Full range of motion in upper and lower extremities. ROM of spine is not restricted. Fine motor movements are normal in both hands.  DTR in the upper and lower extremity are present and symmetric 1+. No clonus is noted.  Patient arises from chair without difficulty. Narrow based gait with normal arm swing bilateral , able to walk on heels and toes . Tandem walk is stable. No pronator drift. Rhomberg negative. Good finger to nose and heel to shin testing. No tremor, dystaxia or dysmetria noted.        Assessment & Plan:  1. Lumbar degenerative disc with pain amplified by fibromyalgia syndrome.  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 Depression and anxiety seen by psychiatry  5. 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 Plan Advised patient to be more active, started a walking program and doing some exercises. Also advised her to think about joining [facility], [facility] [place]. I explained to the patient bands being physically active would be very beneficial to her problems, 1.-5. Refilled her hydrocodone, patient should follow up in 3 month.

## 2012-02-02 ENCOUNTER — Telehealth: Payer: Self-pay | Admitting: Physical Medicine & Rehabilitation

## 2012-02-02 NOTE — Telephone Encounter (Signed)
Saw PA, Hydrocodone not called in.

## 2012-02-02 NOTE — Telephone Encounter (Signed)
Rx has been called in, pt aware. 

## 2012-02-19 ENCOUNTER — Ambulatory Visit: Payer: Medicare Other | Admitting: Internal Medicine

## 2012-02-19 ENCOUNTER — Other Ambulatory Visit: Payer: Self-pay | Admitting: Internal Medicine

## 2012-02-19 ENCOUNTER — Telehealth: Payer: Self-pay | Admitting: Internal Medicine

## 2012-02-19 NOTE — Telephone Encounter (Signed)
Patient called [place] [facility] regarding lab specimen.  Patient was told of appointment 07/19 missed and rescheduled to 07/22 at 4:00.  Patient will take 24hr urine to [facility] in [place].  Confirmed that phone numbers were correct in system both home and mobile.  Patient states has hard time remembering things.

## 2012-02-20 LAB — SODIUM, URINE, TIMED: Sodium, Ur: 59 mEq/L

## 2012-02-22 ENCOUNTER — Ambulatory Visit: Payer: Medicare Other | Admitting: Internal Medicine

## 2012-03-04 ENCOUNTER — Other Ambulatory Visit: Payer: Self-pay | Admitting: *Deleted

## 2012-03-04 MED ORDER — HYDROCODONE-ACETAMINOPHEN 10-325 MG PO TABS: 2.0000 | ORAL_TABLET | Freq: Three times a day (TID) | ORAL | Status: DC | PRN

## 2012-04-06 ENCOUNTER — Other Ambulatory Visit: Payer: Self-pay | Admitting: *Deleted

## 2012-04-06 MED ORDER — HYDROCODONE-ACETAMINOPHEN 10-325 MG PO TABS: 2.0000 | ORAL_TABLET | Freq: Three times a day (TID) | ORAL | Status: DC | PRN

## 2012-04-11 ENCOUNTER — Telehealth: Payer: Self-pay | Admitting: Internal Medicine

## 2012-04-11 NOTE — Telephone Encounter (Signed)
New Problem:    Patient called in wanting to know how to proceed because both of the medications that Dr. Graciela Husbands prescribed are not working, she is not currently on any treatment, and she feels nauseous.  Please call back.

## 2012-04-11 NOTE — Telephone Encounter (Signed)
Pt told that Dr/ nurse out of office, has many questions and concerns on meds/ nausea/ urine results and salt tablets that was discussed prior,. Told pt she will get a call tomorrow to advise additional meds. Told pt to discuss nausea also with pcp, pt agreed to plan, please call pt back 04/12/12

## 2012-04-12 NOTE — Telephone Encounter (Signed)
Dr. Graciela Husbands reviewed the patient's chart. He stated he would not like to make any changes in the patient's therapy until he sees her on 04/19/12. I have called the patient and made her aware of this. She is quite emotional today as she does not feel well. She is fatigued, does notice some tachycardia, and has felt dizzy and lightheaded. She does not check her BP at home. She states she drinks anywhere from 1-2 L of gatorade a day. She does add salt to her food and has the head of her bed elevated. I have suggested that she wear some tight knee high socks to help promote venous return. She states she has talked with someone she knows who has POTS and they recommend she may need IV fluids. I explained to her that we typically don't infuse these in the office due to space, but that it typically is a liter of 0.9 % NS over 2 hours. I advised if she feels she needs evaluation in the ER for this, she should proceed there. The last two beta blockers she was given either caused insomnia for her or made her feel like she couldn't walk straight. She verbalizes understanding of recommendations. She has been advised to keep her appointment for next week with Dr. Graciela Husbands so he can re-evaluate her. She is agreeable.

## 2012-04-19 ENCOUNTER — Ambulatory Visit (INDEPENDENT_AMBULATORY_CARE_PROVIDER_SITE_OTHER): Payer: Medicare Other | Admitting: Internal Medicine

## 2012-04-19 ENCOUNTER — Encounter: Payer: Self-pay | Admitting: Internal Medicine

## 2012-04-19 VITALS — BP 111/74 | HR 114 | Ht 66.0 in | Wt 116.0 lb

## 2012-04-19 DIAGNOSIS — F3289 Other specified depressive episodes: Secondary | ICD-10-CM

## 2012-04-19 DIAGNOSIS — F329 Major depressive disorder, single episode, unspecified: Secondary | ICD-10-CM

## 2012-04-19 DIAGNOSIS — I951 Orthostatic hypotension: Secondary | ICD-10-CM

## 2012-04-19 MED ORDER — MIDODRINE HCL 5 MG PO TABS: 5.0000 mg | ORAL_TABLET | Freq: Three times a day (TID) | ORAL | Status: DC

## 2012-04-19 NOTE — Assessment & Plan Note (Signed)
We spent 39 minuutes discussinng the above

## 2012-04-19 NOTE — Assessment & Plan Note (Signed)
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 She failed to tolerate beta blockers. We will add ProAmatine significant augmented blood pressure and I might try clonidine for central sympatholysis.  I have reiterated to her the strong association between dysautonomia and anxiety/depression and the need for ongoing intervention in this regard. We have offered her to contact number for [facility] hear in  [place].

## 2012-04-19 NOTE — Patient Instructions (Addendum)
Start Proamatine 5mg  three times per day.  Your physician wants you to follow-up in: 3 months with Dr. Graciela Husbands.  You will receive a reminder letter in the mail two months in advance. If you  don't receive a letter, please call our office to schedule the follow-up appointment.  [facility]  [phone] [address] [place], [place]

## 2012-04-19 NOTE — Progress Notes (Signed)
Patient Care Team: Madelin Rear. [name], MD as PCP - General (Internal Medicine)   HPI  [name] is a 36 y.o. female Seen in followup of POTS with a history of postural intolerance and syncope.  Echo>>normal LV function  Her volume status is improved with now clear urine. She is drinking Gatorade. Urine sodium was 148. Try beta blockers; she failed to tolerate either the Inderal or the now wall. Family issues remain stressful with her chuldrenparticularly.  She is tearful and admits to depression.   Past Medical History  Diagnosis Date  . Fibromyalgia   . Anxiety   . Depression   . Degenerative disc disease     Spinal, small syrinx  . Chronic pain     Dr. Wynn Banker  . GERD (gastroesophageal reflux disease)   . Esophageal stricture     Status post dilatation  . Syncope     Negative tilt table test  . Palpitations     No documented arrhythmias  . Hiatal hernia   . Internal hemorrhoids without mention of complication   . Personal history of colonic polyps 10/04/2000    HYPERPLASTIC POLYP  . Irritable bowel syndrome     Past Surgical History  Procedure Date  . Cesarean section   . Partial hysterectomy   . Tonsillectomy   . Left elbow surgery     Current Outpatient Prescriptions  Medication Sig Dispense Refill  . amphetamine-dextroamphetamine (ADDERALL, 20MG ,) 20 MG tablet Take 20 mg by mouth daily.        Marland Kitchen aspirin EC 81 MG tablet Take 81 mg by mouth. When she remembers.       . butabarbital (BUTISOL) 30 MG tablet Take 30 mg by mouth 3 (three) times daily as needed.        . clonazePAM (KLONOPIN) 2 MG tablet Take 2 mg by mouth 4 (four) times daily.        Marland Kitchen esomeprazole (NEXIUM) 40 MG capsule Take 40 mg by mouth 2 (two) times daily.        . fludrocortisone (FLORINEF) 0.1 MG tablet Take 0.1 mg by mouth 2 (two) times daily.        Marland Kitchen HYDROcodone-acetaminophen (NORCO) 10-325 MG per tablet Take 2 tablets by mouth every 8 (eight) hours as needed for pain.  180 tablet  0    . NON FORMULARY Depression medication        No Known Allergies  Review of Systems negative except from HPI and PMH  Physical Exam BP 111/74  Pulse 114  Ht 5\' 6"  (1.676 m)  Wt 116 lb (52.617 kg)  BMI 18.72 kg/m2 Well developed and nourished in no acute distress HENT normal Neck supple with JVP-flat Clear Regular rate and rhythm, no murmurs or gallops Abd-soft with active BS No Clubbing cyanosis edema Skin-warm and dry A & Oriented  Grossly normal sensory and motor function Careful    Assessment and  Plan

## 2012-05-03 ENCOUNTER — Encounter: Payer: Self-pay | Admitting: Physical Medicine and Rehabilitation

## 2012-05-03 ENCOUNTER — Encounter
Payer: Medicare Other | Attending: Physical Medicine and Rehabilitation | Admitting: Physical Medicine and Rehabilitation

## 2012-05-03 VITALS — BP 108/72 | HR 100 | Resp 14 | Ht 66.0 in | Wt 118.0 lb

## 2012-05-03 DIAGNOSIS — M47812 Spondylosis without myelopathy or radiculopathy, cervical region: Secondary | ICD-10-CM | POA: Insufficient documentation

## 2012-05-03 DIAGNOSIS — M5136 Other intervertebral disc degeneration, lumbar region: Secondary | ICD-10-CM

## 2012-05-03 DIAGNOSIS — G8929 Other chronic pain: Secondary | ICD-10-CM | POA: Insufficient documentation

## 2012-05-03 DIAGNOSIS — M545 Low back pain, unspecified: Secondary | ICD-10-CM | POA: Insufficient documentation

## 2012-05-03 DIAGNOSIS — Z5181 Encounter for therapeutic drug level monitoring: Secondary | ICD-10-CM

## 2012-05-03 DIAGNOSIS — G950 Syringomyelia and syringobulbia: Secondary | ICD-10-CM | POA: Insufficient documentation

## 2012-05-03 DIAGNOSIS — M549 Dorsalgia, unspecified: Secondary | ICD-10-CM

## 2012-05-03 DIAGNOSIS — F3289 Other specified depressive episodes: Secondary | ICD-10-CM | POA: Insufficient documentation

## 2012-05-03 DIAGNOSIS — I951 Orthostatic hypotension: Secondary | ICD-10-CM | POA: Insufficient documentation

## 2012-05-03 DIAGNOSIS — F411 Generalized anxiety disorder: Secondary | ICD-10-CM | POA: Insufficient documentation

## 2012-05-03 DIAGNOSIS — M51379 Other intervertebral disc degeneration, lumbosacral region without mention of lumbar back pain or lower extremity pain: Secondary | ICD-10-CM | POA: Insufficient documentation

## 2012-05-03 DIAGNOSIS — IMO0001 Reserved for inherently not codable concepts without codable children: Secondary | ICD-10-CM | POA: Insufficient documentation

## 2012-05-03 DIAGNOSIS — K219 Gastro-esophageal reflux disease without esophagitis: Secondary | ICD-10-CM | POA: Insufficient documentation

## 2012-05-03 DIAGNOSIS — M5137 Other intervertebral disc degeneration, lumbosacral region: Secondary | ICD-10-CM

## 2012-05-03 DIAGNOSIS — F329 Major depressive disorder, single episode, unspecified: Secondary | ICD-10-CM | POA: Insufficient documentation

## 2012-05-03 MED ORDER — HYDROCODONE-ACETAMINOPHEN 10-325 MG PO TABS: 2.0000 | ORAL_TABLET | Freq: Three times a day (TID) | ORAL | Status: DC | PRN

## 2012-05-03 NOTE — Progress Notes (Signed)
Subjective:    Patient ID: [name], female    DOB: 27-Jun-1976, 36 y.o.   MRN: [id number]  HPI The patient complains about chronic pain low back pain, which radiates into her abdomen on the left. The patient also complains about intermittend numbness and tingling in her feet.  The problem has been stable. The patient reports that she saw Dr. Lovell Sheehan the neurosurgeon, who recommended surgery for an annular tear of her L4-5 disc. The patient is not considering surgery at that time.  Pain Inventory Average Pain 7 Pain Right Now 5 My pain is constant, burning, dull, stabbing and aching  In the last 24 hours, has pain interfered with the following? General activity 8 Relation with others 7 Enjoyment of life 7 What TIME of day is your pain at its worst? varies Sleep (in general) Fair  Pain is worse with: some activites Pain improves with: pacing activities and medication Relief from Meds: 7  Mobility walk without assistance how many minutes can you walk? 20 ability to climb steps?  yes do you drive?  yes  Function disabled: date disabled 2009  Neuro/Psych weakness numbness tingling anxiety  Prior Studies Any changes since last visit?  no  Physicians involved in your care Any changes since last visit?  no   Family History  Problem Relation Age of Onset  . Diabetes type II Mother   . Colon polyps Mother   . Diverticulitis Mother   . Diabetes Mother   . Heart disease Mother   . Diabetes type II Brother   . Uterine cancer    . Ovarian cancer     History   Social History  . Marital Status: Married    Spouse Name: N/A    Number of Children: 2  . Years of Education: N/A   Occupational History  . Disability    Social History Main Topics  . Smoking status: Former Smoker    Types: Cigarettes    Quit date: 06/30/2011  . Smokeless tobacco: Never Used  . Alcohol Use: No  . Drug Use: No  . Sexually Active: None   Other Topics Concern  . None   Social  History Narrative  . None   Past Surgical History  Procedure Date  . Cesarean section   . Partial hysterectomy   . Tonsillectomy   . Left elbow surgery    Past Medical History  Diagnosis Date  . Fibromyalgia   . Anxiety   . Depression   . Degenerative disc disease     Spinal, small syrinx  . Chronic pain     Dr. Wynn Banker  . GERD (gastroesophageal reflux disease)   . Esophageal stricture     Status post dilatation  . Syncope     Negative tilt table test  . Palpitations     No documented arrhythmias  . Hiatal hernia   . Internal hemorrhoids without mention of complication   . Personal history of colonic polyps 10/04/2000    HYPERPLASTIC POLYP  . Irritable bowel syndrome    BP 108/72  Pulse 100  Resp 14  Ht 5\' 6"  (1.676 m)  Wt 118 lb (53.524 kg)  BMI 19.05 kg/m2  SpO2 97%     Review of Systems  Musculoskeletal: Positive for myalgias, back pain and arthralgias.  Neurological: Positive for weakness and numbness.  Psychiatric/Behavioral: The patient is nervous/anxious.   All other systems reviewed and are negative.       Objective:   Physical Exam Constitutional:  She is oriented to person, place, and time. She appears well-developed and well-nourished.  HENT:  Head: Normocephalic.  Neck: Neck supple.  Musculoskeletal: She exhibits tenderness.  Neurological: She is alert and oriented to person, place, and time.  Skin: Skin is dry.  Psychiatric: She has a normal mood and affect.  Very slow thinking process, slow speech   Symmetric normal motor tone is noted throughout. Normal muscle bulk. Muscle testing reveals 5/5 muscle strength of the upper extremity, and 5/5 of the lower extremity. Full range of motion in upper and lower extremities. ROM of spine is not restricted. Fine motor movements are normal in both hands.  DTR in the upper and lower extremity are present and symmetric 1+. No clonus is noted.  Patient arises from chair without difficulty. Narrow based  gait with normal arm swing bilateral , able to walk on heels and toes . Tandem walk is stable. No pronator drift. Rhomberg negative.  Good finger to nose and heel to shin testing. No tremor, dystaxia or dysmetria noted.         Assessment & Plan:  1. Lumbar degenerative disc with pain amplified by fibromyalgia syndrome.  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 Depression and anxiety seen by psychiatry  5. 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 Palpitations, advised patient to talk to her psychiatrist, whether there would be a different medication than Aderall, which does not have arrhythmias/palpitations as side effect. Plan  Advised patient to be more active, start a walking program and doing some exercises. Also advised her to think about joining the [facility], [facility] [place]. I explained to the patient that being physically active would be very beneficial to her problems, 1.-5.  Refilled her hydrocodone, patient should follow up in 3 month.

## 2012-05-03 NOTE — Patient Instructions (Signed)
Advised patient to start working out, maybe joining a Y.

## 2012-05-12 NOTE — Progress Notes (Signed)
Subjective:    Patient ID: [name], female    DOB: February 12, 1976, 36 y.o.   MRN: [id number]  HPI The patient complains about chronic pain low back pain, which radiates into her abdomen on the left.  The patient also complains about intermittend numbness and tingling in her feet. The problem has been stable. The patient reports that she saw Dr. Lovell Sheehan the neurosurgeon, who recommended surgery for an annular tear of her L4-5 disc. The patient is not considering surgery at that time. Pain Inventory Average Pain 7 Pain Right Now 8 My pain is sharp, burning, dull, stabbing, tingling and aching  In the last 24 hours, has pain interfered with the following? General activity 8 Relation with others 8 Enjoyment of life 8 What TIME of day is your pain at its worst? Morning and Eveniing Sleep (in general) Poor  Pain is worse with: walking, bending, sitting, inactivity, standing, unsure and some activites Pain improves with: rest, heat/ice, medication and TENS Relief from Meds: 6  Mobility walk without assistance ability to climb steps?  yes do you drive?  yes  Function disabled: date disabled  I need assistance with the following:  meal prep, household duties and shopping  Neuro/Psych weakness numbness tremor tingling spasms dizziness confusion anxiety  Prior Studies Any changes since last visit?  no  Physicians involved in your care Any changes since last visit?  no   Family History  Problem Relation Age of Onset  . Diabetes type II Mother   . Colon polyps Mother   . Diverticulitis Mother   . Diabetes Mother   . Heart disease Mother   . Diabetes type II Brother   . Uterine cancer    . Ovarian cancer     History   Social History  . Marital Status: Married    Spouse Name: N/A    Number of Children: 2  . Years of Education: N/A   Occupational History  . Disability    Social History Main Topics  . Smoking status: Former Smoker    Types: Cigarettes   Quit date: 06/30/2011  . Smokeless tobacco: Never Used  . Alcohol Use: No  . Drug Use: No  . Sexually Active: None   Other Topics Concern  . None   Social History Narrative  . None   Past Surgical History  Procedure Date  . Cesarean section   . Partial hysterectomy   . Tonsillectomy   . Left elbow surgery    Past Medical History  Diagnosis Date  . Fibromyalgia   . Anxiety   . Depression   . Degenerative disc disease     Spinal, small syrinx  . Chronic pain     Dr. Wynn Banker  . GERD (gastroesophageal reflux disease)   . Esophageal stricture     Status post dilatation  . Syncope     Negative tilt table test  . Palpitations     No documented arrhythmias  . Hiatal hernia   . Internal hemorrhoids without mention of complication   . Personal history of colonic polyps 10/04/2000    HYPERPLASTIC POLYP  . Irritable bowel syndrome    BP 105/62  Pulse 86  Resp 16  Ht 5\' 6"  (1.676 m)  Wt 114 lb (51.71 kg)  BMI 18.40 kg/m2  SpO2 98%     Review of Systems  HENT: Negative.   Eyes: Negative.   Respiratory: Negative.   Cardiovascular: Negative.   Gastrointestinal: Negative.   Genitourinary: Negative.  Musculoskeletal: Positive for myalgias and arthralgias.  Neurological: Positive for dizziness, tremors, weakness and numbness.  Hematological: Negative.   Psychiatric/Behavioral: Negative.        Objective:   Physical Exam  Constitutional: She is oriented to person, place, and time. She appears well-developed and well-nourished.  HENT:  Head: Normocephalic.  Neck: Neck supple.  Musculoskeletal: She exhibits tenderness.  Neurological: She is alert and oriented to person, place, and time.  Skin: Skin is dry.  Psychiatric: She has a normal mood and affect.       Very slow thinking process, slow speech   Symmetric normal motor tone is noted throughout. Normal muscle bulk. Muscle testing reveals 5/5 muscle strength of the upper extremity, and 5/5 of the lower  extremity. Full range of motion in upper and lower extremities. ROM of spine is not restricted. Fine motor movements are normal in both hands.  DTR in the upper and lower extremity are present and symmetric 1+. No clonus is noted.  Patient arises from chair without difficulty. Narrow based gait with normal arm swing bilateral , able to walk on heels and toes . Tandem walk is stable. No pronator drift. Rhomberg negative. Good finger to nose and heel to shin testing. No tremor, dystaxia or dysmetria noted.        Assessment & Plan:  1. Lumbar degenerative disc with pain amplified by fibromyalgia syndrome.  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 Depression and anxiety seen by psychiatry  5. 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 Plan Advised patient to be more active, started a walking program and doing some exercises. Also advised her to think about joining the [facility], [facility] [place]. I explained to the patient bands being physically active would be very beneficial to her problems, 1.-5. Refilled her hydrocodone, patient should follow up in 3 month.

## 2012-06-03 ENCOUNTER — Other Ambulatory Visit: Payer: Self-pay | Admitting: *Deleted

## 2012-06-03 MED ORDER — HYDROCODONE-ACETAMINOPHEN 10-325 MG PO TABS: 2.0000 | ORAL_TABLET | Freq: Three times a day (TID) | ORAL | Status: DC | PRN

## 2012-07-12 ENCOUNTER — Telehealth: Payer: Self-pay

## 2012-07-12 MED ORDER — HYDROCODONE-ACETAMINOPHEN 10-325 MG PO TABS: 2.0000 | ORAL_TABLET | Freq: Three times a day (TID) | ORAL | Status: DC | PRN

## 2012-07-12 NOTE — Telephone Encounter (Signed)
Patient request hydrocodone refill to [facility].  Hydrocodone called in.  Patient aware.

## 2012-07-12 NOTE — Telephone Encounter (Signed)
Patient called to follow up on refill request.  Did not say what mediation.

## 2012-08-04 ENCOUNTER — Encounter
Payer: Medicare Other | Attending: Physical Medicine and Rehabilitation | Admitting: Physical Medicine and Rehabilitation

## 2012-08-04 ENCOUNTER — Encounter: Payer: Self-pay | Admitting: Physical Medicine and Rehabilitation

## 2012-08-04 VITALS — BP 123/62 | HR 81 | Resp 14 | Ht 66.0 in | Wt 119.4 lb

## 2012-08-04 DIAGNOSIS — G950 Syringomyelia and syringobulbia: Secondary | ICD-10-CM | POA: Insufficient documentation

## 2012-08-04 DIAGNOSIS — M545 Low back pain, unspecified: Secondary | ICD-10-CM

## 2012-08-04 DIAGNOSIS — M5136 Other intervertebral disc degeneration, lumbar region: Secondary | ICD-10-CM

## 2012-08-04 DIAGNOSIS — F329 Major depressive disorder, single episode, unspecified: Secondary | ICD-10-CM | POA: Insufficient documentation

## 2012-08-04 DIAGNOSIS — M25559 Pain in unspecified hip: Secondary | ICD-10-CM | POA: Insufficient documentation

## 2012-08-04 DIAGNOSIS — F3289 Other specified depressive episodes: Secondary | ICD-10-CM | POA: Insufficient documentation

## 2012-08-04 DIAGNOSIS — M47812 Spondylosis without myelopathy or radiculopathy, cervical region: Secondary | ICD-10-CM | POA: Insufficient documentation

## 2012-08-04 DIAGNOSIS — F411 Generalized anxiety disorder: Secondary | ICD-10-CM | POA: Insufficient documentation

## 2012-08-04 DIAGNOSIS — I951 Orthostatic hypotension: Secondary | ICD-10-CM | POA: Insufficient documentation

## 2012-08-04 DIAGNOSIS — IMO0001 Reserved for inherently not codable concepts without codable children: Secondary | ICD-10-CM | POA: Insufficient documentation

## 2012-08-04 DIAGNOSIS — M51379 Other intervertebral disc degeneration, lumbosacral region without mention of lumbar back pain or lower extremity pain: Secondary | ICD-10-CM | POA: Insufficient documentation

## 2012-08-04 DIAGNOSIS — M5137 Other intervertebral disc degeneration, lumbosacral region: Secondary | ICD-10-CM

## 2012-08-04 DIAGNOSIS — M25551 Pain in right hip: Secondary | ICD-10-CM

## 2012-08-04 MED ORDER — HYDROCODONE-ACETAMINOPHEN 10-325 MG PO TABS: 2.0000 | ORAL_TABLET | Freq: Three times a day (TID) | ORAL | Status: DC | PRN

## 2012-08-04 NOTE — Progress Notes (Signed)
Subjective:    Patient ID: [name], female    DOB: 31-Jan-1976, 37 y.o.   MRN: [id number]  HPI The patient complains about chronic pain low back pain, which radiates into her abdomen on the left. The patient also complains about intermittend numbness and tingling in her feet.  The problem has been stable. The patient reports that she saw Dr. Lovell Sheehan the neurosurgeon, who recommended surgery for an annular tear of her L4-5 disc. The patient is not considering surgery at that time. The patient also complains about right hip pain, when walking too long.  Pain Inventory Average Pain 7 Pain Right Now 6 My pain is intermittent, sharp, burning, dull, stabbing, tingling and aching  In the last 24 hours, has pain interfered with the following? General activity 8 Relation with others 8 Enjoyment of life 8 What TIME of day is your pain at its worst? morning and night Sleep (in general) Fair  Pain is worse with: walking, bending, sitting, inactivity, standing and some activites Pain improves with: rest, heat/ice, medication and TENS Relief from Meds: 6  Mobility walk without assistance how many minutes can you walk? 10 ability to climb steps?  yes do you drive?  yes  Function disabled: date disabled  I need assistance with the following:  meal prep, household duties and shopping  Neuro/Psych weakness numbness tremor tingling spasms dizziness confusion anxiety  Prior Studies Any changes since last visit?  no  Physicians involved in your care Any changes since last visit?  no   Family History  Problem Relation Age of Onset  . Diabetes type II Mother   . Colon polyps Mother   . Diverticulitis Mother   . Diabetes Mother   . Heart disease Mother   . Diabetes type II Brother   . Uterine cancer    . Ovarian cancer     History   Social History  . Marital Status: Married    Spouse Name: N/A    Number of Children: 2  . Years of Education: N/A   Occupational  History  . Disability    Social History Main Topics  . Smoking status: Former Smoker    Types: Cigarettes    Quit date: 06/30/2011  . Smokeless tobacco: Never Used  . Alcohol Use: No  . Drug Use: No  . Sexually Active: None   Other Topics Concern  . None   Social History Narrative  . None   Past Surgical History  Procedure Date  . Cesarean section   . Partial hysterectomy   . Tonsillectomy   . Left elbow surgery    Past Medical History  Diagnosis Date  . Fibromyalgia   . Anxiety   . Depression   . Degenerative disc disease     Spinal, small syrinx  . Chronic pain     Dr. Wynn Banker  . GERD (gastroesophageal reflux disease)   . Esophageal stricture     Status post dilatation  . Syncope     Negative tilt table test  . Palpitations     No documented arrhythmias  . Hiatal hernia   . Internal hemorrhoids without mention of complication   . Personal history of colonic polyps 10/04/2000    HYPERPLASTIC POLYP  . Irritable bowel syndrome    BP 123/62  Pulse 81  Resp 14  Ht 5\' 6"  (1.676 m)  Wt 119 lb 6.4 oz (54.159 kg)  BMI 19.27 kg/m2  SpO2 98%    Review of Systems  Musculoskeletal:  Positive for back pain.  Neurological: Positive for dizziness, tremors, weakness and numbness.       Tingling and spasms  Psychiatric/Behavioral: Positive for confusion and dysphoric mood. The patient is nervous/anxious.   All other systems reviewed and are negative.       Objective:   Physical Exam Constitutional: She is oriented to person, place, and time. She appears well-developed and well-nourished.  HENT:  Head: Normocephalic.  Neck: Neck supple.  Musculoskeletal: She exhibits tenderness.  Neurological: She is alert and oriented to person, place, and time.  Skin: Skin is dry.  Psychiatric: She has a normal mood and affect.  Very slow thinking process, slow speech at last visit, today she is alert, normal attention and cognition  Symmetric normal motor tone is noted  throughout. Normal muscle bulk. Muscle testing reveals 5/5 muscle strength of the upper extremity, and 5/5 of the lower extremity. Full range of motion in upper and lower extremities, mild pain at end of ROM in right hip flexion. ROM of spine is not restricted. Fine motor movements are normal in both hands.  DTR in the upper and lower extremity are present and symmetric 1+. No clonus is noted.  Patient arises from chair without difficulty. Narrow based gait with normal arm swing bilateral , able to walk on heels and toes . Tandem walk is stable. No pronator drift. Rhomberg negative.  Good finger to nose and heel to shin testing. No tremor, dystaxia or dysmetria noted.         Assessment & Plan:  1. Lumbar degenerative disc with pain amplified by fibromyalgia syndrome.  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 Depression and anxiety seen by psychiatry  5. 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 Palpitations, advised patient to talk to her psychiatrist, whether there would be a different medication than Aderall, which does not have arrhythmias/palpitations as side effect.  6. Right hip pain, pain only with end of ROM into flexion, pain anterior/lateral, Ordered x-ray of right hip,because patient also has Hx of early osteopenia Plan  Advised patient to be more active, start a walking program and doing some exercises. Also advised her to think about joining the [facility], [place]. I explained to the patient that being physically active would be very beneficial to her problems, 1.-5.  Refilled her hydrocodone, patient should follow up in 3 month.

## 2012-08-04 NOTE — Patient Instructions (Signed)
Continue with your walking and exercise program 

## 2012-08-25 ENCOUNTER — Telehealth: Payer: Self-pay | Admitting: Cardiovascular Disease

## 2012-08-25 NOTE — Telephone Encounter (Signed)
New problem:    Asking for a call back today.   Patient is very emotional . Recently seen her results of echo & lab results  On my chart. Does not understand why her lab levels are so low.

## 2012-08-25 NOTE — Telephone Encounter (Signed)
Called patient back. She states that she looked at her results in my chart and noticed that her echo from 11/2011 stated that her EF was 55 to 60 % and she thought that this was abnormal. Advised her that this is a normal EF. She was concerned about her chronically low hemoglobin. Advised her to follow up with primary care doctor concerning this. She has a follow up appointment with Dr.Klein in March.

## 2012-09-14 ENCOUNTER — Telehealth: Payer: Self-pay

## 2012-09-14 MED ORDER — HYDROCODONE-ACETAMINOPHEN 10-325 MG PO TABS: 2.0000 | ORAL_TABLET | Freq: Three times a day (TID) | ORAL | Status: DC | PRN

## 2012-09-14 NOTE — Telephone Encounter (Signed)
Notified [facility] medicine refilled.

## 2012-09-14 NOTE — Telephone Encounter (Signed)
Patient called requesting medication refill.

## 2012-09-17 ENCOUNTER — Other Ambulatory Visit: Payer: Self-pay

## 2012-10-11 ENCOUNTER — Ambulatory Visit: Payer: Medicare Other | Admitting: Internal Medicine

## 2012-10-13 ENCOUNTER — Other Ambulatory Visit: Payer: Self-pay | Admitting: *Deleted

## 2012-10-13 MED ORDER — HYDROCODONE-ACETAMINOPHEN 10-325 MG PO TABS: 2.0000 | ORAL_TABLET | Freq: Three times a day (TID) | ORAL | Status: DC | PRN

## 2012-10-13 NOTE — Telephone Encounter (Signed)
Hydrocodone refilled per fax request from pharmacy.

## 2012-11-02 ENCOUNTER — Encounter
Payer: Medicare Other | Attending: Physical Medicine and Rehabilitation | Admitting: Physical Medicine and Rehabilitation

## 2012-11-02 ENCOUNTER — Encounter: Payer: Self-pay | Admitting: Physical Medicine and Rehabilitation

## 2012-11-02 VITALS — BP 99/61 | HR 68 | Resp 14 | Ht 66.0 in | Wt 119.0 lb

## 2012-11-02 DIAGNOSIS — M5137 Other intervertebral disc degeneration, lumbosacral region: Secondary | ICD-10-CM

## 2012-11-02 DIAGNOSIS — M51379 Other intervertebral disc degeneration, lumbosacral region without mention of lumbar back pain or lower extremity pain: Secondary | ICD-10-CM | POA: Insufficient documentation

## 2012-11-02 DIAGNOSIS — IMO0001 Reserved for inherently not codable concepts without codable children: Secondary | ICD-10-CM | POA: Insufficient documentation

## 2012-11-02 DIAGNOSIS — Z79899 Other long term (current) drug therapy: Secondary | ICD-10-CM | POA: Insufficient documentation

## 2012-11-02 DIAGNOSIS — Z5181 Encounter for therapeutic drug level monitoring: Secondary | ICD-10-CM

## 2012-11-02 MED ORDER — HYDROCODONE-ACETAMINOPHEN 10-325 MG PO TABS: 2.0000 | ORAL_TABLET | Freq: Three times a day (TID) | ORAL | Status: DC | PRN

## 2012-11-02 NOTE — Progress Notes (Signed)
Subjective:    Patient ID: [name], female    DOB: Jun 14, 1976, 37 y.o.   MRN: [id number]  HPI The patient complains about chronic pain low back pain, which radiates into her abdomen on the left. The patient also complains about intermittend numbness and tingling in her feet.  The problem has been stable. The patient reports that she saw Dr. Lovell Sheehan the neurosurgeon, who recommended surgery for an annular tear of her L4-5 disc. The patient is not considering surgery at that time.  The patient also complains about right hip pain, when walking too long. She states, that she had some increased pain with the cold weather, now back to baseline.  Pain Inventory Average Pain 6 Pain Right Now 6 My pain is intermittent, sharp, burning, dull, stabbing, tingling and aching  In the last 24 hours, has pain interfered with the following? General activity 7 Relation with others 8 Enjoyment of life 8 What TIME of day is your pain at its worst? evening Sleep (in general) Fair  Pain is worse with: walking, bending, sitting, inactivity, standing and some activites Pain improves with: rest, heat/ice, medication and TENS Relief from Meds: 6  Mobility walk without assistance how many minutes can you walk? 5-10 ability to climb steps?  yes do you drive?  yes needs help with transfers Do you have any goals in this area?  yes  Function disabled: date disabled 09 I need assistance with the following:  meal prep, household duties and shopping  Neuro/Psych weakness numbness tingling spasms dizziness confusion depression anxiety  Prior Studies Any changes since last visit?  no  Physicians involved in your care Any changes since last visit?  no   Family History  Problem Relation Age of Onset  . Diabetes type II Mother   . Colon polyps Mother   . Diverticulitis Mother   . Diabetes Mother   . Heart disease Mother   . Diabetes type II Brother   . Uterine cancer    . Ovarian  cancer     History   Social History  . Marital Status: Married    Spouse Name: N/A    Number of Children: 2  . Years of Education: N/A   Occupational History  . Disability    Social History Main Topics  . Smoking status: Former Smoker    Types: Cigarettes    Quit date: 06/30/2011  . Smokeless tobacco: Never Used  . Alcohol Use: No  . Drug Use: No  . Sexually Active: None   Other Topics Concern  . None   Social History Narrative  . None   Past Surgical History  Procedure Laterality Date  . Cesarean section    . Partial hysterectomy    . Tonsillectomy    . Left elbow surgery     Past Medical History  Diagnosis Date  . Fibromyalgia   . Anxiety   . Depression   . Degenerative disc disease     Spinal, small syrinx  . Chronic pain     Dr. Wynn Banker  . GERD (gastroesophageal reflux disease)   . Esophageal stricture     Status post dilatation  . Syncope     Negative tilt table test  . Palpitations     No documented arrhythmias  . Hiatal hernia   . Internal hemorrhoids without mention of complication   . Personal history of colonic polyps 10/04/2000    HYPERPLASTIC POLYP  . Irritable bowel syndrome    BP 99/61  Pulse 68  Resp 14  Ht 5\' 6"  (1.676 m)  Wt 119 lb (53.978 kg)  BMI 19.22 kg/m2  SpO2 100%     Review of Systems  Musculoskeletal: Positive for back pain.  Neurological: Positive for dizziness, weakness and numbness.  Psychiatric/Behavioral: Positive for confusion and dysphoric mood. The patient is nervous/anxious.   All other systems reviewed and are negative.       Objective:   Physical Exam Constitutional: She is oriented to person, place, and time. She appears well-developed and well-nourished.  HENT:  Head: Normocephalic.  Neck: Neck supple.  Musculoskeletal: She exhibits tenderness.  Neurological: She is alert and oriented to person, place, and time.  Skin: Skin is dry.  Psychiatric: She has a normal mood and affect.   Symmetric  normal motor tone is noted throughout. Normal muscle bulk. Muscle testing reveals 5/5 muscle strength of the upper extremity, and 5/5 of the lower extremity. Full range of motion in upper and lower extremities, mild pain at end of ROM in right hip flexion. ROM of spine is not restricted. Fine motor movements are normal in both hands.  DTR in the upper and lower extremity are present and symmetric 1+. No clonus is noted.  Patient arises from chair without difficulty. Narrow based gait with normal arm swing bilateral , able to walk on heels and toes . Tandem walk is stable. No pronator drift. Rhomberg negative.  Good finger to nose and heel to shin testing. No tremor, dystaxia or dysmetria noted.         Assessment & Plan:  1. Lumbar degenerative disc with pain amplified by fibromyalgia syndrome.  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 Depression and anxiety seen by psychiatry  5. 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 Palpitations, advised patient to talk to her psychiatrist, whether there would be a different medication than Aderall, which does not have arrhythmias/palpitations as side effect.  6. Right hip pain, pain only with end of ROM into flexion, pain anterior/lateral, Ordered x-ray of right hip,because patient also has Hx of early osteopenia, she has not done this yet, she still complains about right hip pain, and stated that she will get her X-rays taken soon.  Plan  Advised patient to be more active, start a walking program and doing some exercises. Also advised her to think about joining the [facility], [facility] [place] home and try to work out in the pool. I explained to the patient that being physically active would be very beneficial to her problems, 1.-6.  Refilled her hydrocodone, patient should follow up in 3 month.

## 2012-11-02 NOTE — Patient Instructions (Signed)
Try to be more active, start a walking and exercise program. Exercising in the pool would be very beneficial.

## 2012-11-14 ENCOUNTER — Other Ambulatory Visit: Payer: Self-pay | Admitting: Physical Medicine & Rehabilitation

## 2012-12-14 ENCOUNTER — Other Ambulatory Visit: Payer: Self-pay | Admitting: Physical Medicine and Rehabilitation

## 2013-01-04 ENCOUNTER — Encounter (HOSPITAL_COMMUNITY): Payer: Self-pay | Admitting: *Deleted

## 2013-01-04 ENCOUNTER — Emergency Department (HOSPITAL_COMMUNITY): Payer: Medicare Other

## 2013-01-04 ENCOUNTER — Emergency Department (HOSPITAL_COMMUNITY)
Admission: EM | Admit: 2013-01-04 | Discharge: 2013-01-05 | Disposition: A | Discharge status: Home / Self Care | Payer: Medicare Other | Attending: Emergency Medicine | Admitting: Emergency Medicine

## 2013-01-04 DIAGNOSIS — Z860100 Personal history of colon polyps, unspecified: Secondary | ICD-10-CM | POA: Insufficient documentation

## 2013-01-04 DIAGNOSIS — K219 Gastro-esophageal reflux disease without esophagitis: Secondary | ICD-10-CM | POA: Insufficient documentation

## 2013-01-04 DIAGNOSIS — Z8669 Personal history of other diseases of the nervous system and sense organs: Secondary | ICD-10-CM | POA: Insufficient documentation

## 2013-01-04 DIAGNOSIS — I951 Orthostatic hypotension: Secondary | ICD-10-CM | POA: Insufficient documentation

## 2013-01-04 DIAGNOSIS — IMO0002 Reserved for concepts with insufficient information to code with codable children: Secondary | ICD-10-CM | POA: Insufficient documentation

## 2013-01-04 DIAGNOSIS — Z79899 Other long term (current) drug therapy: Secondary | ICD-10-CM | POA: Insufficient documentation

## 2013-01-04 DIAGNOSIS — Z8719 Personal history of other diseases of the digestive system: Secondary | ICD-10-CM | POA: Insufficient documentation

## 2013-01-04 DIAGNOSIS — F329 Major depressive disorder, single episode, unspecified: Secondary | ICD-10-CM | POA: Insufficient documentation

## 2013-01-04 DIAGNOSIS — F3289 Other specified depressive episodes: Secondary | ICD-10-CM | POA: Insufficient documentation

## 2013-01-04 DIAGNOSIS — F411 Generalized anxiety disorder: Secondary | ICD-10-CM | POA: Insufficient documentation

## 2013-01-04 DIAGNOSIS — G8929 Other chronic pain: Secondary | ICD-10-CM | POA: Insufficient documentation

## 2013-01-04 DIAGNOSIS — R202 Paresthesia of skin: Secondary | ICD-10-CM

## 2013-01-04 DIAGNOSIS — R0789 Other chest pain: Secondary | ICD-10-CM | POA: Insufficient documentation

## 2013-01-04 DIAGNOSIS — R209 Unspecified disturbances of skin sensation: Secondary | ICD-10-CM | POA: Insufficient documentation

## 2013-01-04 DIAGNOSIS — Z8739 Personal history of other diseases of the musculoskeletal system and connective tissue: Secondary | ICD-10-CM | POA: Insufficient documentation

## 2013-01-04 DIAGNOSIS — Z87891 Personal history of nicotine dependence: Secondary | ICD-10-CM | POA: Insufficient documentation

## 2013-01-04 DIAGNOSIS — Z8679 Personal history of other diseases of the circulatory system: Secondary | ICD-10-CM | POA: Insufficient documentation

## 2013-01-04 HISTORY — DX: Orthostatic hypotension: I95.1

## 2013-01-04 HISTORY — DX: Other specified cardiac arrhythmias: I49.8

## 2013-01-04 HISTORY — DX: Tachycardia, unspecified: R00.0

## 2013-01-04 HISTORY — DX: Postural orthostatic tachycardia syndrome (POTS): G90.A

## 2013-01-04 LAB — BASIC METABOLIC PANEL
CO2: 28 mEq/L (ref 19–32)
Chloride: 100 mEq/L (ref 96–112)
Creatinine, Ser: 0.78 mg/dL (ref 0.50–1.10)
Potassium: 3.4 mEq/L — ABNORMAL LOW (ref 3.5–5.1)
Sodium: 138 mEq/L (ref 135–145)

## 2013-01-04 LAB — TROPONIN I: Troponin I: <0.3 ng/mL (ref <0.30)

## 2013-01-04 LAB — CBC
MCV: 95.8 fL (ref 78.0–100.0)
Platelets: 208 10*3/uL (ref 150–400)
RBC: 3.8 MIL/uL — ABNORMAL LOW (ref 3.87–5.11)
WBC: 4.7 10*3/uL (ref 4.0–10.5)

## 2013-01-04 NOTE — ED Notes (Signed)
Pt c/o numbness on her right side of her body and chest tightness.

## 2013-01-05 NOTE — ED Provider Notes (Signed)
History     CSN: [id number]  Arrival date & time 01/04/13  2150   First MD Initiated Contact with Patient 01/04/13 2339      Chief Complaint  Patient presents with  . arm numbness   . lip numbness   . leg numbness     (Consider location/radiation/quality/duration/timing/severity/associated sxs/prior treatment) HPI Comments: [name] is a 37 y.o. female who presents to the Emergency Department complaining of sudden onset of numbness and tingling to her right arm, leg and lower lip that began earlier tonight, approximately two hours prior to ED arrival.  She states the symptoms began while watching TV and lasted approximately one hour.  She also reports having some chest tightness at onset that resolved prior to arrival and also states that she has that frequently and feels similar to previous.  She states that she has increased mood swings lately and at times, becomes angry for no apparent reason.  She denies recent headache, dizziness, vomiting , visual changes, or dyspnea.  Significant other states that he was present during the time this occurred and states she appeared normal and speech was not slurred nor did he notice any facial droop.   She states that she has hx of POTS and had a negative cardiac cath in 2006.  Was seen by her cardiologist approx 8 months ago, but no repeat testing has been done  The history is provided by the patient.    Past Medical History  Diagnosis Date  . Fibromyalgia   . Anxiety   . Depression   . Degenerative disc disease     Spinal, small syrinx  . Chronic pain     Dr. Wynn Banker  . GERD (gastroesophageal reflux disease)   . Esophageal stricture     Status post dilatation  . Syncope     Negative tilt table test  . Palpitations     No documented arrhythmias  . Hiatal hernia   . Internal hemorrhoids without mention of complication   . Personal history of colonic polyps 10/04/2000    HYPERPLASTIC POLYP  . Irritable bowel syndrome   . POTS  (postural orthostatic tachycardia syndrome)     Past Surgical History  Procedure Laterality Date  . Cesarean section    . Partial hysterectomy    . Tonsillectomy    . Left elbow surgery      Family History  Problem Relation Age of Onset  . Diabetes type II Mother   . Colon polyps Mother   . Diverticulitis Mother   . Diabetes Mother   . Heart disease Mother   . Diabetes type II Brother   . Uterine cancer    . Ovarian cancer      History  Substance Use Topics  . Smoking status: Former Smoker    Types: Cigarettes    Quit date: 06/30/2011  . Smokeless tobacco: Never Used  . Alcohol Use: No    OB History   Grav Para Term Preterm Abortions TAB SAB Ect Mult Living                  Review of Systems  Constitutional: Negative for fever, chills and fatigue.  HENT: Negative for sore throat, trouble swallowing, neck pain and neck stiffness.   Eyes: Negative for visual disturbance.  Respiratory: Negative for cough, chest tightness, shortness of breath and wheezing.   Cardiovascular: Negative for chest pain and palpitations.  Gastrointestinal: Negative for nausea, vomiting, abdominal pain and blood in stool.  Genitourinary:  Negative for dysuria, hematuria and flank pain.  Musculoskeletal: Negative for myalgias, back pain and arthralgias.  Skin: Negative for rash.  Neurological: Positive for numbness. Negative for dizziness, seizures, syncope, speech difficulty, weakness and headaches.  Hematological: Does not bruise/bleed easily.  Psychiatric/Behavioral: Negative for behavioral problems and confusion.  All other systems reviewed and are negative.    Allergies  Review of patient's allergies indicates no known allergies.  Home Medications   Current Outpatient Rx  Name  Route  Sig  Dispense  Refill  . amphetamine-dextroamphetamine (ADDERALL, 20MG ,) 20 MG tablet   Oral   Take 20 mg by mouth daily.           . clonazePAM (KLONOPIN) 2 MG tablet   Oral   Take 2 mg by  mouth 4 (four) times daily.           . fludrocortisone (FLORINEF) 0.1 MG tablet   Oral   Take 0.1 mg by mouth daily.         Marland Kitchen HYDROcodone-acetaminophen (NORCO) 10-325 MG per tablet   Oral   Take 1 tablet by mouth every 8 (eight) hours as needed for pain.         Marland Kitchen QUEtiapine (SEROQUEL) 50 MG tablet   Oral   Take 50 mg by mouth at bedtime.         Marland Kitchen aspirin EC 81 MG tablet   Oral   Take 81 mg by mouth. When she remembers.          . butabarbital (BUTISOL) 30 MG tablet   Oral   Take 30 mg by mouth 3 (three) times daily as needed.           Marland Kitchen esomeprazole (NEXIUM) 40 MG capsule   Oral   Take 40 mg by mouth 2 (two) times daily.           . midodrine (PROAMATINE) 5 MG tablet   Oral   Take 1 tablet (5 mg total) by mouth 3 (three) times daily.   270 tablet   3     BP 115/86  Pulse 99  Temp(Src) 97.1 F (36.2 C) (Oral)  Resp 20  Ht 5\' 6"  (1.676 m)  Wt 119 lb (53.978 kg)  BMI 19.22 kg/m2  SpO2 100%  Physical Exam  Nursing note and vitals reviewed. Constitutional: She is oriented to person, place, and time. She appears well-developed and well-nourished. No distress.  HENT:  Head: Normocephalic and atraumatic.  Mouth/Throat: Oropharynx is clear and moist. No oropharyngeal exudate.  Eyes: EOM are normal. Pupils are equal, round, and reactive to light.  Neck: Normal range of motion. Neck supple.  Cardiovascular: Normal rate, regular rhythm, normal heart sounds and intact distal pulses.   No murmur heard. Pulmonary/Chest: Effort normal and breath sounds normal. No respiratory distress. She exhibits no tenderness.  Abdominal: Soft. She exhibits no distension. There is no tenderness.  Musculoskeletal: Normal range of motion. She exhibits no tenderness.  Lymphadenopathy:    She has no cervical adenopathy.  Neurological: She is alert and oriented to person, place, and time. She has normal strength. No cranial nerve deficit or sensory deficit. She exhibits  normal muscle tone. Coordination and gait normal. GCS eye subscore is 4. GCS verbal subscore is 5. GCS motor subscore is 6.  Reflex Scores:      Tricep reflexes are 2+ on the right side and 2+ on the left side.      Bicep reflexes are 2+ on the right side  and 2+ on the left side.      Brachioradialis reflexes are 2+ on the right side and 2+ on the left side.      Patellar reflexes are 2+ on the right side and 2+ on the left side.      Achilles reflexes are 2+ on the right side and 2+ on the left side. Skin: Skin is warm and dry.    ED Course  Procedures (including critical care time)  Results for orders placed during the hospital encounter of 01/04/13  CBC      Result Value Range   WBC 4.7  4.0 - 10.5 K/uL   RBC 3.80 (*) 3.87 - 5.11 MIL/uL   Hemoglobin 12.3  12.0 - 15.0 g/dL   HCT 78.2  95.6 - 21.3 %   MCV 95.8  78.0 - 100.0 fL   MCH 32.4  26.0 - 34.0 pg   MCHC 33.8  30.0 - 36.0 g/dL   RDW 08.6  57.8 - 46.9 %   Platelets 208  150 - 400 K/uL  BASIC METABOLIC PANEL      Result Value Range   Sodium 138  135 - 145 mEq/L   Potassium 3.4 (*) 3.5 - 5.1 mEq/L   Chloride 100  96 - 112 mEq/L   CO2 28  19 - 32 mEq/L   Glucose, Bld 96  70 - 99 mg/dL   BUN 9  6 - 23 mg/dL   Creatinine, Ser 6.29  0.50 - 1.10 mg/dL   Calcium 9.5  8.4 - 52.8 mg/dL   GFR calc non Af Amer >90  >90 mL/min   GFR calc Af Amer >90  >90 mL/min  TROPONIN I      Result Value Range   Troponin I <0.30  <0.30 ng/mL     Dg Chest Portable 1 View  01/04/2013   *RADIOLOGY REPORT*  Clinical Data: Right-sided facial pain and right-sided extremity numbness  PORTABLE CHEST - 1 VIEW  Comparison: 06/17/2010  Findings: Cardiomediastinal silhouette is within normal limits. The lungs are clear. No pleural effusion.  No pneumothorax.  No acute osseous abnormality.  IMPRESSION: Normal chest.   Original Report Authenticated By: Christiana Pellant, M.D.        MDM     Date: 01/05/2013  Rate: 74  Rhythm: normal sinus rhythm and  sinus arrhythmia  QRS Axis: normal  Intervals: normal  ST/T Wave abnormalities: normal  Conduction Disutrbances:none  Narrative Interpretation:   Old EKG Reviewed: improved from previous EKG 08/12  EKG read by Dr. Rulon Abide    0005  Patient is feeling better, sx's have resolved.  She denies headache, nausea, vomiting , visual changes, dyspnea or chest pain.  She has ambulated in the dept with a steady gait and has ambulated to the restroom this evening without difficulty.  No ataxia, no dysarthria. No focal neuro or motor deficits,  Vitals stable. Doubt Bell's palsy.  Clinical suspicion for ACS, TIA or stroke is low.    I have discussed pt hx and care plan with the EDP and with the patient.  She agrees to close f/u with her PMD in one-two days.  I have also advised her to return here if her symptoms worsen.      Ramere Downs L. Trisha Mangle, PA-C 01/08/13 1809

## 2013-01-05 NOTE — ED Notes (Signed)
Patient ambulated with standby assist. Denies dizziness or any complaints while ambulating. Reports she feels better.

## 2013-01-05 NOTE — ED Notes (Signed)
Patient reports is feeling better at this time. States chest tightness is still there a little, but feels much better than before.

## 2013-01-16 NOTE — ED Provider Notes (Signed)
Medical screening examination/treatment/procedure(s) were performed by non-physician practitioner and as supervising physician I was immediately available for consultation/collaboration.  Jones Skene, M.D.     Jones Skene, MD 01/16/13 2308

## 2013-01-23 ENCOUNTER — Ambulatory Visit (HOSPITAL_COMMUNITY)
Admission: RE | Admit: 2013-01-23 | Discharge: 2013-01-23 | Disposition: A | Discharge status: Home / Self Care | Payer: Medicare Other | Source: Ambulatory Visit | Attending: Physical Medicine and Rehabilitation | Admitting: Physical Medicine and Rehabilitation

## 2013-01-23 ENCOUNTER — Other Ambulatory Visit (HOSPITAL_COMMUNITY): Payer: Self-pay | Admitting: Internal Medicine

## 2013-01-23 DIAGNOSIS — R55 Syncope and collapse: Secondary | ICD-10-CM

## 2013-01-23 DIAGNOSIS — K219 Gastro-esophageal reflux disease without esophagitis: Secondary | ICD-10-CM

## 2013-01-23 DIAGNOSIS — M25559 Pain in unspecified hip: Secondary | ICD-10-CM | POA: Insufficient documentation

## 2013-01-23 DIAGNOSIS — F3289 Other specified depressive episodes: Secondary | ICD-10-CM

## 2013-01-23 DIAGNOSIS — M25551 Pain in right hip: Secondary | ICD-10-CM

## 2013-01-23 DIAGNOSIS — F329 Major depressive disorder, single episode, unspecified: Secondary | ICD-10-CM

## 2013-01-24 ENCOUNTER — Ambulatory Visit (HOSPITAL_COMMUNITY)
Admission: RE | Admit: 2013-01-24 | Discharge: 2013-01-24 | Disposition: A | Discharge status: Home / Self Care | Payer: Medicare Other | Source: Ambulatory Visit | Attending: Internal Medicine | Admitting: Internal Medicine

## 2013-01-24 DIAGNOSIS — K219 Gastro-esophageal reflux disease without esophagitis: Secondary | ICD-10-CM

## 2013-01-24 DIAGNOSIS — F329 Major depressive disorder, single episode, unspecified: Secondary | ICD-10-CM

## 2013-01-24 DIAGNOSIS — R5381 Other malaise: Secondary | ICD-10-CM | POA: Insufficient documentation

## 2013-01-24 DIAGNOSIS — F3289 Other specified depressive episodes: Secondary | ICD-10-CM

## 2013-01-24 DIAGNOSIS — R55 Syncope and collapse: Secondary | ICD-10-CM | POA: Insufficient documentation

## 2013-01-24 DIAGNOSIS — E041 Nontoxic single thyroid nodule: Secondary | ICD-10-CM | POA: Insufficient documentation

## 2013-01-25 ENCOUNTER — Telehealth: Payer: Self-pay

## 2013-01-25 NOTE — Telephone Encounter (Signed)
Message copied by Judd Gaudier on Wed Jan 25, 2013  3:54 PM ------      Message from: Su Monks      Created: Tue Jan 24, 2013  2:30 PM       Please inform patient X-rays of right hip showed no significant findings.      No acute bony findings or degenerative changes.       ------

## 2013-01-25 NOTE — Telephone Encounter (Signed)
Left message for patient to call back regarding xray results. 

## 2013-01-27 ENCOUNTER — Encounter: Payer: Self-pay | Admitting: Physical Medicine and Rehabilitation

## 2013-01-27 ENCOUNTER — Encounter
Payer: Medicare Other | Attending: Physical Medicine and Rehabilitation | Admitting: Physical Medicine and Rehabilitation

## 2013-01-27 VITALS — BP 113/70 | HR 109 | Resp 16 | Ht 66.0 in | Wt 118.0 lb

## 2013-01-27 DIAGNOSIS — I951 Orthostatic hypotension: Secondary | ICD-10-CM | POA: Insufficient documentation

## 2013-01-27 DIAGNOSIS — M25559 Pain in unspecified hip: Secondary | ICD-10-CM | POA: Insufficient documentation

## 2013-01-27 DIAGNOSIS — R209 Unspecified disturbances of skin sensation: Secondary | ICD-10-CM | POA: Insufficient documentation

## 2013-01-27 DIAGNOSIS — F329 Major depressive disorder, single episode, unspecified: Secondary | ICD-10-CM | POA: Insufficient documentation

## 2013-01-27 DIAGNOSIS — F3289 Other specified depressive episodes: Secondary | ICD-10-CM | POA: Insufficient documentation

## 2013-01-27 DIAGNOSIS — R002 Palpitations: Secondary | ICD-10-CM | POA: Insufficient documentation

## 2013-01-27 DIAGNOSIS — G950 Syringomyelia and syringobulbia: Secondary | ICD-10-CM | POA: Insufficient documentation

## 2013-01-27 DIAGNOSIS — M899 Disorder of bone, unspecified: Secondary | ICD-10-CM | POA: Insufficient documentation

## 2013-01-27 DIAGNOSIS — G8929 Other chronic pain: Secondary | ICD-10-CM | POA: Insufficient documentation

## 2013-01-27 DIAGNOSIS — M47812 Spondylosis without myelopathy or radiculopathy, cervical region: Secondary | ICD-10-CM

## 2013-01-27 DIAGNOSIS — M545 Low back pain, unspecified: Secondary | ICD-10-CM | POA: Insufficient documentation

## 2013-01-27 DIAGNOSIS — IMO0001 Reserved for inherently not codable concepts without codable children: Secondary | ICD-10-CM | POA: Insufficient documentation

## 2013-01-27 DIAGNOSIS — M5137 Other intervertebral disc degeneration, lumbosacral region: Secondary | ICD-10-CM | POA: Insufficient documentation

## 2013-01-27 DIAGNOSIS — M47817 Spondylosis without myelopathy or radiculopathy, lumbosacral region: Secondary | ICD-10-CM

## 2013-01-27 DIAGNOSIS — R109 Unspecified abdominal pain: Secondary | ICD-10-CM | POA: Insufficient documentation

## 2013-01-27 DIAGNOSIS — F411 Generalized anxiety disorder: Secondary | ICD-10-CM | POA: Insufficient documentation

## 2013-01-27 DIAGNOSIS — M51379 Other intervertebral disc degeneration, lumbosacral region without mention of lumbar back pain or lower extremity pain: Secondary | ICD-10-CM | POA: Insufficient documentation

## 2013-01-27 MED ORDER — HYDROCODONE-ACETAMINOPHEN 10-325 MG PO TABS: 1.0000 | ORAL_TABLET | Freq: Three times a day (TID) | ORAL | Status: DC | PRN

## 2013-01-27 NOTE — Telephone Encounter (Signed)
Patient seen in office

## 2013-01-27 NOTE — Patient Instructions (Addendum)
Continue with staying as active as tolerated. Continue with exercising in the pool as long as it is safe. Do not work out in your pool if nobody is there with you.

## 2013-01-27 NOTE — Progress Notes (Signed)
Subjective:    Patient ID: [name], female    DOB: 07-05-1976, 37 y.o.   MRN: [id number]  HPI The patient complains about chronic pain low back pain, which radiates into her abdomen on the left. The patient also complains about intermittend numbness and tingling in her feet.  The problem has been stable. The patient reports that she saw Dr. Lovell Sheehan the neurosurgeon, who recommended surgery for an annular tear of her L4-5 disc. The patient is not considering surgery at that time.  The patient also complains about right hip pain, when walking too long. She states, that she had some increased pain with the cold weather, now back to baseline. She reports that she has started to work out in her pool. She also reports that she went to the ED, because of paresthesias , but every test showed up negative.  Pain Inventory Average Pain 7 Pain Right Now 5 My pain is intermittent, sharp, burning, dull, stabbing, tingling and aching  In the last 24 hours, has pain interfered with the following? General activity 7 Relation with others 7 Enjoyment of life 8 What TIME of day is your pain at its worst? morning,evening,night Sleep (in general) Fair  Pain is worse with: walking, bending, sitting, inactivity, standing, unsure and some activites Pain improves with: rest, heat/ice, therapy/exercise, medication and TENS Relief from Meds: 7  Mobility walk without assistance how many minutes can you walk? 10 ability to climb steps?  yes do you drive?  yes needs help with transfers  Function disabled: date disabled na I need assistance with the following:  meal prep, household duties and shopping  Neuro/Psych weakness numbness tremor tingling trouble walking spasms dizziness  Prior Studies Any changes since last visit?  no x-rays  Physicians involved in your care Any changes since last visit?  no   Family History  Problem Relation Age of Onset  . Diabetes type II Mother   .  Colon polyps Mother   . Diverticulitis Mother   . Diabetes Mother   . Heart disease Mother   . Diabetes type II Brother   . Uterine cancer    . Ovarian cancer     History   Social History  . Marital Status: Married    Spouse Name: N/A    Number of Children: 2  . Years of Education: N/A   Occupational History  . Disability    Social History Main Topics  . Smoking status: Former Smoker    Types: Cigarettes    Quit date: 06/30/2011  . Smokeless tobacco: Never Used  . Alcohol Use: No  . Drug Use: No  . Sexually Active: None   Other Topics Concern  . None   Social History Narrative  . None   Past Surgical History  Procedure Laterality Date  . Cesarean section    . Partial hysterectomy    . Tonsillectomy    . Left elbow surgery     Past Medical History  Diagnosis Date  . Fibromyalgia   . Anxiety   . Depression   . Degenerative disc disease     Spinal, small syrinx  . Chronic pain     Dr. Wynn Banker  . GERD (gastroesophageal reflux disease)   . Esophageal stricture     Status post dilatation  . Syncope     Negative tilt table test  . Palpitations     No documented arrhythmias  . Hiatal hernia   . Internal hemorrhoids without mention of complication   .  Personal history of colonic polyps 10/04/2000    HYPERPLASTIC POLYP  . Irritable bowel syndrome   . POTS (postural orthostatic tachycardia syndrome)    BP 113/70  Pulse 109  Resp 16  Ht 5\' 6"  (1.676 m)  Wt 118 lb (53.524 kg)  BMI 19.05 kg/m2  SpO2 99%     Review of Systems  Musculoskeletal: Positive for gait problem.  Neurological: Positive for dizziness, tremors, weakness and numbness.       Tingling,spasms  All other systems reviewed and are negative.       Objective:   Physical Exam Constitutional: She is oriented to person, place, and time. She appears well-developed and well-nourished.  HENT:  Head: Normocephalic.  Neck: Neck supple.  Musculoskeletal: She exhibits tenderness.   Neurological: She is alert and oriented to person, place, and time.  Skin: Skin is dry.  Psychiatric: She has a normal mood and affect.   Symmetric normal motor tone is noted throughout. Normal muscle bulk. Muscle testing reveals 5/5 muscle strength of the upper extremity, and 5/5 of the lower extremity. Full range of motion in upper and lower extremities, mild pain at end of ROM in right hip flexion. ROM of spine is not restricted. Fine motor movements are normal in both hands.  DTR in the upper and lower extremity are present and symmetric 1+. No clonus is noted.  Patient arises from chair without difficulty. Narrow based gait with normal arm swing bilateral , able to walk on heels and toes . Tandem walk is stable. No pronator drift. Rhomberg negative.  Good finger to nose and heel to shin testing. No tremor, dystaxia or dysmetria noted.         Assessment & Plan:  1. Lumbar degenerative disc, worse at L4-5, with buldging disc at this level.   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 Depression and anxiety seen by psychiatry  5. 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 Palpitations, advised patient to talk to her psychiatrist, whether there would be a different medication than Aderall, which does not have arrhythmias/palpitations as side effect.  6. Right hip pain, pain only with end of ROM into flexion, pain anterior/lateral, Ordered x-ray of right hip,because patient also has Hx of early osteopenia, X-rays were normal, no DJD no necrosis or any other significant findings.  7. Myalgia Plan  Advised patient to be more active, start a walking program and doing some exercises. Also advised her to continue to work out in the pool, as long as somebody is with her, I told her that she should not work out in the pool alone !. I explained to the patient that being  physically active would be very beneficial to her problems, 1.-6.  Refilled her hydrocodone, patient should follow up in 3 month.

## 2013-02-15 ENCOUNTER — Ambulatory Visit: Payer: Medicare Other | Admitting: Endocrinology

## 2013-03-09 ENCOUNTER — Ambulatory Visit: Payer: Medicare Other | Admitting: Endocrinology

## 2013-03-16 ENCOUNTER — Telehealth: Payer: Self-pay

## 2013-03-16 MED ORDER — HYDROCODONE-ACETAMINOPHEN 10-325 MG PO TABS: 2.0000 | ORAL_TABLET | Freq: Three times a day (TID) | ORAL | Status: DC | PRN

## 2013-03-16 NOTE — Telephone Encounter (Signed)
Patient and pharmacy called regarding directions on last hydrocodone script.  Usually she is given 1-2 tablets every 8 hours and last script says 1 tablet.  Please clarify.  Called pharmacy and changed directions to 2 tablets.    Patient aware.

## 2013-04-14 ENCOUNTER — Other Ambulatory Visit: Payer: Self-pay

## 2013-04-14 MED ORDER — HYDROCODONE-ACETAMINOPHEN 10-325 MG PO TABS: 2.0000 | ORAL_TABLET | Freq: Three times a day (TID) | ORAL | Status: DC | PRN

## 2013-04-20 ENCOUNTER — Encounter: Payer: Self-pay | Admitting: Physical Medicine and Rehabilitation

## 2013-04-20 ENCOUNTER — Encounter
Payer: Medicare Other | Attending: Physical Medicine and Rehabilitation | Admitting: Physical Medicine and Rehabilitation

## 2013-04-20 VITALS — BP 110/69 | HR 83 | Resp 14 | Ht 66.0 in | Wt 120.0 lb

## 2013-04-20 DIAGNOSIS — H539 Unspecified visual disturbance: Secondary | ICD-10-CM

## 2013-04-20 DIAGNOSIS — M47817 Spondylosis without myelopathy or radiculopathy, lumbosacral region: Secondary | ICD-10-CM

## 2013-04-20 DIAGNOSIS — M545 Low back pain, unspecified: Secondary | ICD-10-CM | POA: Insufficient documentation

## 2013-04-20 DIAGNOSIS — F411 Generalized anxiety disorder: Secondary | ICD-10-CM | POA: Insufficient documentation

## 2013-04-20 DIAGNOSIS — K219 Gastro-esophageal reflux disease without esophagitis: Secondary | ICD-10-CM | POA: Insufficient documentation

## 2013-04-20 DIAGNOSIS — M5137 Other intervertebral disc degeneration, lumbosacral region: Secondary | ICD-10-CM | POA: Insufficient documentation

## 2013-04-20 DIAGNOSIS — I498 Other specified cardiac arrhythmias: Secondary | ICD-10-CM | POA: Insufficient documentation

## 2013-04-20 DIAGNOSIS — R200 Anesthesia of skin: Secondary | ICD-10-CM

## 2013-04-20 DIAGNOSIS — R413 Other amnesia: Secondary | ICD-10-CM

## 2013-04-20 DIAGNOSIS — F329 Major depressive disorder, single episode, unspecified: Secondary | ICD-10-CM | POA: Insufficient documentation

## 2013-04-20 DIAGNOSIS — Z79899 Other long term (current) drug therapy: Secondary | ICD-10-CM

## 2013-04-20 DIAGNOSIS — R209 Unspecified disturbances of skin sensation: Secondary | ICD-10-CM | POA: Insufficient documentation

## 2013-04-20 DIAGNOSIS — G950 Syringomyelia and syringobulbia: Secondary | ICD-10-CM | POA: Insufficient documentation

## 2013-04-20 DIAGNOSIS — M47812 Spondylosis without myelopathy or radiculopathy, cervical region: Secondary | ICD-10-CM | POA: Insufficient documentation

## 2013-04-20 DIAGNOSIS — F3289 Other specified depressive episodes: Secondary | ICD-10-CM | POA: Insufficient documentation

## 2013-04-20 DIAGNOSIS — IMO0001 Reserved for inherently not codable concepts without codable children: Secondary | ICD-10-CM

## 2013-04-20 DIAGNOSIS — M51379 Other intervertebral disc degeneration, lumbosacral region without mention of lumbar back pain or lower extremity pain: Secondary | ICD-10-CM | POA: Insufficient documentation

## 2013-04-20 DIAGNOSIS — H538 Other visual disturbances: Secondary | ICD-10-CM | POA: Insufficient documentation

## 2013-04-20 DIAGNOSIS — G8929 Other chronic pain: Secondary | ICD-10-CM | POA: Insufficient documentation

## 2013-04-20 DIAGNOSIS — I951 Orthostatic hypotension: Secondary | ICD-10-CM | POA: Insufficient documentation

## 2013-04-20 DIAGNOSIS — Z5181 Encounter for therapeutic drug level monitoring: Secondary | ICD-10-CM

## 2013-04-20 DIAGNOSIS — M25559 Pain in unspecified hip: Secondary | ICD-10-CM | POA: Insufficient documentation

## 2013-04-20 MED ORDER — HYDROCODONE-ACETAMINOPHEN 10-325 MG PO TABS: 2.0000 | ORAL_TABLET | Freq: Three times a day (TID) | ORAL | Status: DC | PRN

## 2013-04-20 NOTE — Progress Notes (Signed)
Subjective:    Patient ID: [name], female    DOB: 26-Feb-1976, 37 y.o.   MRN: [id number]  HPI The patient complains about chronic pain low back pain, which radiates into her abdomen on the left. The patient also complains about intermittend numbness and tingling in her feet. She also complains about numbness in her face and in the back of her head. She reports that she had some clear drainage from her nose , for about 3 month, and she is afraid it might be a CSF leak, she looked that up in the web. She also complains about some intermittend blurred vision, and difficulties to memorize sometimes.   The patient reports that she saw Dr. Lovell Sheehan the neurosurgeon, who recommended surgery for an annular tear of her L4-5 disc. The patient is not considering surgery at that time.  The patient also complains about right hip pain, when walking too long.  She reports that she is not working out in her pool anymore.    Pain Inventory Average Pain 7 Pain Right Now 8 My pain is intermittent, sharp, burning, dull, stabbing, tingling and aching  In the last 24 hours, has pain interfered with the following? General activity 8 Relation with others 7 Enjoyment of life 8 What TIME of day is your pain at its worst? morning and evening Sleep (in general) Fair  Pain is worse with: sitting, inactivity, standing and some activites Pain improves with: rest, heat/ice, medication and TENS Relief from Meds: 7  Mobility how many minutes can you walk? 10 ability to climb steps?  yes do you drive?  yes needs help with transfers  Function disabled: date disabled . I need assistance with the following:  meal prep, household duties and shopping  Neuro/Psych weakness numbness tingling dizziness  Prior Studies Any changes since last visit?  no  Physicians involved in your care Any changes since last visit?  no   Family History  Problem Relation Age of Onset  . Diabetes type II Mother   .  Colon polyps Mother   . Diverticulitis Mother   . Diabetes Mother   . Heart disease Mother   . Diabetes type II Brother   . Uterine cancer    . Ovarian cancer     History   Social History  . Marital Status: Married    Spouse Name: N/A    Number of Children: 2  . Years of Education: N/A   Occupational History  . Disability    Social History Main Topics  . Smoking status: Former Smoker    Types: Cigarettes    Quit date: 06/30/2011  . Smokeless tobacco: Never Used  . Alcohol Use: No  . Drug Use: No  . Sexual Activity: None   Other Topics Concern  . None   Social History Narrative  . None   Past Surgical History  Procedure Laterality Date  . Cesarean section    . Partial hysterectomy    . Tonsillectomy    . Left elbow surgery     Past Medical History  Diagnosis Date  . Fibromyalgia   . Anxiety   . Depression   . Degenerative disc disease     Spinal, small syrinx  . Chronic pain     Dr. Wynn Banker  . GERD (gastroesophageal reflux disease)   . Esophageal stricture     Status post dilatation  . Syncope     Negative tilt table test  . Palpitations     No documented  arrhythmias  . Hiatal hernia   . Internal hemorrhoids without mention of complication   . Personal history of colonic polyps 10/04/2000    HYPERPLASTIC POLYP  . Irritable bowel syndrome   . POTS (postural orthostatic tachycardia syndrome)    BP 110/69  Pulse 83  Resp 14  Ht 5\' 6"  (1.676 m)  Wt 120 lb (54.432 kg)  BMI 19.38 kg/m2  SpO2 99%     Review of Systems  HENT: Positive for neck pain.   Musculoskeletal: Positive for back pain.  Neurological: Positive for dizziness, weakness and numbness.  All other systems reviewed and are negative.       Objective:   Physical Exam Constitutional: She is oriented to person, place, and time. She appears well-developed and well-nourished.  HENT:  Head: Normocephalic.  Neck: Neck supple.  Musculoskeletal: She exhibits tenderness.   Neurological: She is alert and oriented to person, place, and time.  Skin: Skin is dry.  Psychiatric: She has a normal mood and affect.   Symmetric normal motor tone is noted throughout. Normal muscle bulk. Muscle testing reveals 5/5 muscle strength of the upper extremity, and 5/5 of the lower extremity. Full range of motion in upper and lower extremities, mild pain at end of ROM in right hip flexion. ROM of spine is not restricted. Fine motor movements are normal in both hands.  DTR in the upper and lower extremity are present and symmetric 1+. No clonus is noted.  Patient arises from chair without difficulty. Narrow based gait with normal arm swing bilateral , able to walk on heels and toes . Tandem walk is stable. No pronator drift. Rhomberg negative.  Good finger to nose and heel to shin testing. No tremor, dystaxia or dysmetria noted.         Assessment & Plan:  1. Lumbar degenerative disc, worse at L4-5, with buldging disc at this level.  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 Depression and anxiety seen by psychiatry  5. 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 Palpitations, advised patient to talk to her psychiatrist, whether there would be a different medication than Aderall, which does not have arrhythmias/palpitations as side effect.  6. Right hip pain, pain only with end of ROM into flexion, pain anterior/lateral, Ordered x-ray of right hip,because patient also has Hx of early osteopenia, X-rays were normal, no DJD no necrosis or any other significant findings.  7. Myalgia  8. Diffuse Sx like numbness in the face , at the back of her head,and in her right arm, sometimes difficulties with her memory, and intermittend blurred vision. Patient had a normal MRI of her brain , but this was in 2006, and she did not have all of those Sx at that time. Referred  her to neurology for further evaluation for her multiple diffuse Sx, sounds a little like MS, although the patient had a normal MRI of her brain , but this was in 2006. Plan  Advised patient to be more active, start a walking program and doing some exercises. Also  I explained to the patient that being physically active would be very beneficial to her problems, 1.-6.  Refilled her hydrocodone, patient should follow up in 7 weeks.

## 2013-04-20 NOTE — Patient Instructions (Signed)
Continue with your exercise and walking program 

## 2013-05-08 ENCOUNTER — Telehealth: Payer: Self-pay | Admitting: *Deleted

## 2013-05-08 ENCOUNTER — Encounter: Payer: Self-pay | Admitting: *Deleted

## 2013-05-08 NOTE — Telephone Encounter (Signed)
PT states that she is unsure of what to do next with POTS she is discouraged about what her options are. She states that she is willing to come back to see Dr Marikay Alar but doesn't know what else he can do for her.  Pt would like to speak to nurse. She also states that she has been having syncopal episodes and doesn't remember if she needs to go to ED when this happens or not.

## 2013-05-09 NOTE — Telephone Encounter (Signed)
LMTCB to schedule ROV per Dr. Graciela Husbands

## 2013-05-12 NOTE — Telephone Encounter (Signed)
LMTCB again

## 2013-05-15 ENCOUNTER — Telehealth: Payer: Self-pay | Admitting: Internal Medicine

## 2013-05-15 NOTE — Telephone Encounter (Signed)
New message ° ° ° ° °Returning Sherri's call °

## 2013-05-15 NOTE — Telephone Encounter (Signed)
New Problem// Follow Up   Pt request to speak with a nurse to make an appt with Dr. Graciela Husbands per nurse request// she also states that she had an  episode that happened through the night on 10/12// she states that there was a squeezing and pulling in her chest that traveled to her ribs. Experienced S.O.B// she laid down and it went away after 2-3 min but she had never experienced anything like it. She feels that it was heart related.. she asks if she should wait for her appointment or come in early. Please advise.

## 2013-05-16 NOTE — Telephone Encounter (Signed)
Spoke with pt. Appt has been made for her to see Norma Fredrickson, NP on May 18, 2013. Pt aware of this appt. Pt reports she had symptoms as described below on Saturday night. She felt squeezing, pulling and pressure in chest that went to the top of her stomach at that time. Pain went away on own. She did have right arm and back pain throughout Saturday night. She reports right arm pain is not new and she does have a history of back problems.  Arm and back pain has resolved. Pt states the treatments she has tried for POTS have not worked and made things worse. She has frequent headaches and chest pain that go away when she lays down. She faints every now and then.  I told her I would send information to Dr. Graciela Husbands and she should go to ED if symptoms worsen prior to appt on Thursday.  She is planning on being here for appt with Norma Fredrickson, NP on Oct. 16.

## 2013-05-16 NOTE — Telephone Encounter (Signed)
Follow up:  Pt states she called twice yesterday and is still waiting on a nurses call back.

## 2013-05-17 NOTE — Telephone Encounter (Signed)
Spoke with patient who voices same concerns again about her POTS. She is very upset and nervous, but that she has a neurology appointment next week. I scheduled her with Dr. Graciela Husbands next week and cancelled appointment with Lawson Fiscal per patient request. I advised patient that if she experiences chest pain/pressure/squeezing again, not to wait to see [place] next week but to go to ED. Patient verbalized understanding and agreeable to plan.

## 2013-05-18 ENCOUNTER — Ambulatory Visit: Payer: Medicare Other | Admitting: Nurse Practitioner

## 2013-05-19 NOTE — Telephone Encounter (Signed)
Patient scheduled to see Dr. Graciela Husbands 10/30 at [facility]

## 2013-05-30 ENCOUNTER — Ambulatory Visit (INDEPENDENT_AMBULATORY_CARE_PROVIDER_SITE_OTHER): Payer: Medicare Other | Admitting: Neurology

## 2013-05-30 ENCOUNTER — Encounter: Payer: Self-pay | Admitting: Neurology

## 2013-05-30 VITALS — BP 98/66 | HR 88 | Resp 16 | Ht 68.0 in | Wt 122.0 lb

## 2013-05-30 DIAGNOSIS — F32A Depression, unspecified: Secondary | ICD-10-CM

## 2013-05-30 DIAGNOSIS — G44209 Tension-type headache, unspecified, not intractable: Secondary | ICD-10-CM

## 2013-05-30 DIAGNOSIS — F329 Major depressive disorder, single episode, unspecified: Secondary | ICD-10-CM

## 2013-05-30 DIAGNOSIS — R413 Other amnesia: Secondary | ICD-10-CM

## 2013-05-30 NOTE — Progress Notes (Signed)
[facility]  Provider:  Melvyn Novas, M D  Referring Provider: Elfredia Nevins, MD Primary Care Physician:  Cassell Smiles., MD  Chief Complaint  Patient presents with  . NP/ MEMORY CHANGES    Numbness /face/Rt arm Changes in Vision MOCA Test was given at today's visit    HPI:  [name] is a 37 y.o. female  Is seen here as a referral  from [place] , [place] at Dr Wynn Banker office.  The patient had  been seen in this office for a different problem in the past , was evaluated for sleep and had no organic abnormalities on her sleep study almost 3 years ago.  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   She was referred back to her referring physicain and to psychiatry and no follow up appointments in  Sleep clinic / neurology were to be made.   She has a Banker of different symptoms and was referred based on Mrs Prueter's desire to evaluate her for MS.   Dr Lovell Sheehan has followed her, but she reports it is hard to see him , so she thought she would see a neurologist .  Objective diagnoses have been established degenerative disc disease with an annular bulge at L4-L5 disc and annular tear . 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 POTS per dr Graciela Husbands. She is followed by a psychiatrist , Dr Enid Cutter.    ROS _ She is tearful reporting her symptoms, she is frustrated and feels worthless, " demented " and " retarded  ". Abdominal, lower back, extremity numbness tingling and pain. Numbness in the face, blurred vision, a protruding eye, and Epworth at 18 points  -  3 years ago at 16 points-  and no sleep disorder was identified. No apnea, no PLMs and high sleep efficiency ( no objective insomnia) . Mood changes, swinging or cyclic , worse on narcotics.   The syringomyelia can explain the finger and arm numbness , her DDD L4-5  Can explain her leg weakness .  Her depression can explain the memory loss and  the perception of insomnia. The pain medications lead to hypersomnia and constipation , mood changes.  POTs explains  the passing out spells.   She feels her memory is poor , but  did OK on MOCA.   headache is her other subjective complaint.   History   Social History  . Marital Status: Married    Spouse Name: N/A    Number of Children: 2  . Years of Education: N/A   Occupational History  . Disability    Social History Main Topics  . Smoking status: Former Smoker    Types: Cigarettes    Quit date: 06/30/2011  . Smokeless tobacco: Never Used  . Alcohol Use: No  . Drug Use: No  . Sexual Activity: Yes   Other Topics Concern  . Not on file   Social History Narrative  . No narrative on file    Family History  Problem Relation Age of Onset  . Diabetes type II Mother   . Colon polyps Mother   . Diverticulitis Mother   . Diabetes Mother   . Heart disease Mother   . Diabetes type II Brother   . Uterine cancer    . Ovarian cancer      Past Medical History  Diagnosis Date  . Fibromyalgia   . Anxiety   . Depression   . Degenerative disc  disease     Spinal, small syrinx  . Chronic pain     Dr. Wynn Banker  . GERD (gastroesophageal reflux disease)   . Esophageal stricture     Status post dilatation  . Syncope     Negative tilt table test  . Palpitations     No documented arrhythmias  . Hiatal hernia   . Internal hemorrhoids without mention of complication   . Personal history of colonic polyps 10/04/2000    HYPERPLASTIC POLYP  . Irritable bowel syndrome   . POTS (postural orthostatic tachycardia syndrome)     Past Surgical History  Procedure Laterality Date  . Cesarean section    . Partial hysterectomy    . Tonsillectomy    . Left elbow surgery      Current Outpatient Prescriptions  Medication Sig Dispense Refill  . amphetamine-dextroamphetamine (ADDERALL, 20MG ,) 20 MG tablet Take 20 mg by mouth daily.        . clonazePAM (KLONOPIN) 2 MG tablet Take 2 mg by  mouth 4 (four) times daily.        Marland Kitchen HYDROcodone-acetaminophen (NORCO) 10-325 MG per tablet Take 2 tablets by mouth every 8 (eight) hours as needed for pain.  180 tablet  0  . aspirin EC 81 MG tablet Take 81 mg by mouth. When she remembers.       Marland Kitchen esomeprazole (NEXIUM) 40 MG capsule Take 40 mg by mouth 2 (two) times daily.        . fludrocortisone (FLORINEF) 0.1 MG tablet Take 0.1 mg by mouth daily.       No current facility-administered medications for this visit.    Allergies as of 05/30/2013  . (No Known Allergies)    Vitals: BP 98/66  Pulse 88  Resp 16  Ht 5\' 8"  (1.727 m)  Wt 122 lb (55.339 kg)  BMI 18.55 kg/m2 Last Weight:  Wt Readings from Last 1 Encounters:  05/30/13 122 lb (55.339 kg)   Last Height:   Ht Readings from Last 1 Encounters:  05/30/13 5\' 8"  (1.727 m)    Physical exam:  General: The patient is awake, alert and appears not in acute distress. The patient is well groomed. Head: Normocephalic, atraumatic. Neck is supple. Mallampati1, neck circumference: 13 ,  Cardiovascular:  Regular rate and rhythm, without  murmurs or carotid bruit, and without distended neck veins. Respiratory: Lungs are clear to auscultation. Skin:  Without evidence of edema, or rash Trunk:  Neurologic exam : The patient is awake and alert, oriented to place and time.  Memory subjective described as impaired , the patient is preoccupied with various symptoms, sensations and perceptions of her body functions and in multiple locations.    There is a normal attention span & concentration ability. Speech is non- fluent without  dysarthria, dysphonia or aphasia. The patient speaks slow and low - Mood and affect are severely depressed.  Cranial nerves: Pupils are equal and briskly reactive to light. Funduscopic exam without  evidence of pallor or edema. Extraocular movements  in vertical and horizontal planes intact and without nystagmus. Visual fields by finger perimetry are intact. Hearing to  finger rub intact.  Facial sensation intact to fine touch. Facial motor strength is symmetric and tongue and uvula move midline.  Motor exam:   Normal tone and normal muscle bulk and give away in all extremities. Sensory:  Fine touch, pinprick and vibration were tested in all extremities. Proprioception is normal. Right L5 dermatome is perceived as "numb" . Right arm  is perceived as weak.  Coordination: Rapid alternating movements in the fingers/hands is  normal,  without evidence of ataxia, dysmetria or tremor.  Gait and station: Patient walks without assistive device .Strength within normal limits. Stance is stable and normal.  Deep tendon reflexes: in the  upper and lower extremities are symmetric and intact. Babinski maneuver response is downgoing.   Assessment:  After physical and neurologic examination, review of laboratory studies, imaging, neurophysiology testing and pre-existing records, the patient presents as she has in the past with  Dolor saltans et migrans.  No indication of multiple sclerosis, and no objective sleep disorder.  MOCA testing is only suspicious for the patient refusing pats of the test " I don't think I can do that " .  22 out of 30 points.    Plan:  Treatment plan and additional workup : Return to referring provider. MRI brain not ordered , there is little evidence of  MS - no symptoms that are permanent.   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 MRIs of the cervical and thoracic spine were obained in the recent past through Dr. Lovell Sheehan.  I advise to review these when the MRI brain is obtained.  Please ask the patients Psychiatrist to evaluate or refer for neuropsychiatric battery test.

## 2013-05-30 NOTE — Patient Instructions (Signed)
Somatization Disorder °Somatization disorder is a type of somatoform disorder. It occurs when a person feels pain and other symptoms that cannot be explained by medical findings. It is diagnosed after many medical tests and exams. The pain, physical symptoms, or emotional symptoms are real. They are not faked or created. Often, people with somatization disorder are worried about their symptoms. Along with a medical caregiver, a mental health caregiver can help to diagnose somatization disorder and teach coping skills. The disorder can last for several years and may be intermittent. °CAUSES  °It is unclear what causes somatization disorder. It may be related to: °· Abnormal nerve impulses. °· Psychological stress. °· Family history. °· Chronic pain. °· History of sexual or physical abuse. °The disorder usually begins before age 30. It occurs more often in women than men. °SYMPTOMS °There are many symptoms that can accompany somatization disorder. Usually, several mild to moderate symptoms appear. A person with somatization disorder seeks many medical visits to try to determine the cause of symptoms. °DIAGNOSIS °Diagnostic testing will vary depending on the specific symptoms involved. A person with this disorder will often have many medical tests and exams to rule out serious physical health problems. Evaluation may include: °· Physical exam. °· Screening health questionnaire. °· Medical tests, such as blood tests, urine tests, X-rays, or other imaging tests. °TREATMENT °There is no cure for somatization disorder, but the symptoms can be managed and sometimes prevented. Treatment aims at teaching coping skills, reducing stress, and preventing new episodes. Once somatization disorder is diagnosed, treatment may include: °· Regular monitoring and consultation with a dedicated caregiver for evaluation and coping skills. °· Regular monitoring and therapy with a mental health provider to increase understanding of triggers  and coping skills. Therapies may include: °· Talk therapy. °· Cognitive behavioral therapy. °· Antidepressant medicines. °It can be helpful for a primary caregiver and mental health provider to work together to develop a treatment plan. °HOME CARE INSTRUCTIONS °· Follow up with your primary caregiver or mental health provider as directed. °· Take medicines as directed. °· Try to reduce stress. °· Exercise regularly. °SEEK MEDICAL CARE IF: °· New symptoms appear. °· Pain or symptoms do not go away or become severe. °SEEK IMMEDIATE MEDICAL CARE IF: °You feel that your life or health are in danger. °MAKE SURE YOU: °· Understand these instructions. °· Will watch your condition. °· Will get help right away if you are not doing well or get worse. °Document Released: 08/22/2010 Document Revised: 10/12/2011 Document Reviewed: 08/22/2010 °ExitCare® Patient Information ©2014 [employer]. ° °

## 2013-05-30 NOTE — Addendum Note (Signed)
Addended by: Melvyn Novas on: 05/30/2013 05:15 PM   Modules accepted: Orders, Level of Service

## 2013-06-01 ENCOUNTER — Encounter: Payer: Self-pay | Admitting: Internal Medicine

## 2013-06-01 ENCOUNTER — Ambulatory Visit (INDEPENDENT_AMBULATORY_CARE_PROVIDER_SITE_OTHER): Payer: Medicare Other | Admitting: Internal Medicine

## 2013-06-01 VITALS — BP 117/73 | HR 116 | Ht 67.0 in | Wt 123.0 lb

## 2013-06-01 DIAGNOSIS — F329 Major depressive disorder, single episode, unspecified: Secondary | ICD-10-CM

## 2013-06-01 DIAGNOSIS — I951 Orthostatic hypotension: Secondary | ICD-10-CM

## 2013-06-01 DIAGNOSIS — G90A Postural orthostatic tachycardia syndrome (POTS): Secondary | ICD-10-CM

## 2013-06-01 DIAGNOSIS — R079 Chest pain, unspecified: Secondary | ICD-10-CM | POA: Insufficient documentation

## 2013-06-01 DIAGNOSIS — F3289 Other specified depressive episodes: Secondary | ICD-10-CM

## 2013-06-01 DIAGNOSIS — R000 Tachycardia, unspecified: Secondary | ICD-10-CM

## 2013-06-01 DIAGNOSIS — F411 Generalized anxiety disorder: Secondary | ICD-10-CM

## 2013-06-01 MED ORDER — FLUDROCORTISONE ACETATE 0.1 MG PO TABS
ORAL_TABLET | ORAL | Status: DC
Start: 2013-06-01 — End: 2019-11-23

## 2013-06-01 NOTE — Assessment & Plan Note (Signed)
This informs all the above issues i have given her the information re [facility] again

## 2013-06-01 NOTE — Assessment & Plan Note (Addendum)
She has a 25  B. Per minute rise in to above  115bpm   We will increase her Florinef.  We will consider the use of clonidine as a central sympatholytic

## 2013-06-01 NOTE — Patient Instructions (Signed)
Your physician has recommended you make the following change in your medication:  1) Increase your Florinef to 0.2 mg twice a day  Your physician recommends that you schedule a follow-up appointment in: 6 weeks with Dr. Graciela Husbands.

## 2013-06-01 NOTE — Assessment & Plan Note (Signed)
His atypical fleeting chest pains. I do not think a cardiac

## 2013-06-01 NOTE — Progress Notes (Signed)
      Patient Care Team: Elfredia Nevins, MD as PCP - General (Internal Medicine)   HPI  [name] is a 37 y.o. female Seen in followup of POTS with a history of postural intolerance and syncope.  Echo>>normal LV function  Her volume status is improved with now clear urine. She is drinking Gatorade. Urine sodium was 148. Try beta blockers; she failed to tolerate either the Inderal or the now wall.  Family issues remain stressful with her chuldrenparticularly.  She is tearful and admits to depression.  She has numerous complaints including chest pain back pain and epigastric pain and headache and dizziness and social isolation;  she did not remember our discussions about counseling. I see that she has seen Dr. CD at neurology as well as the pain clinic  She has had 3 episodes of syncope which she describes occurring without warning. He is multiple episodes of presyncope  She has not tolerated beta blockers in the past she has not seen any success with Benadryl.   Past Medical History  Diagnosis Date  . Fibromyalgia   . Anxiety   . Depression   . Degenerative disc disease     Spinal, small syrinx  . Chronic pain     Dr. Wynn Banker  . GERD (gastroesophageal reflux disease)   . Esophageal stricture     Status post dilatation  . Syncope     Negative tilt table test  . Palpitations     No documented arrhythmias  . Hiatal hernia   . Internal hemorrhoids without mention of complication   . Personal history of colonic polyps 10/04/2000    HYPERPLASTIC POLYP  . Irritable bowel syndrome   . POTS (postural orthostatic tachycardia syndrome)     Past Surgical History  Procedure Laterality Date  . Cesarean section    . Partial hysterectomy    . Tonsillectomy    . Left elbow surgery      Current Outpatient Prescriptions  Medication Sig Dispense Refill  . amphetamine-dextroamphetamine (ADDERALL, 20MG ,) 20 MG tablet Take 20 mg by mouth daily.        Marland Kitchen aspirin EC 81 MG  tablet Take 81 mg by mouth. When she remembers.       . clonazePAM (KLONOPIN) 2 MG tablet Take 2 mg by mouth 4 (four) times daily.        Marland Kitchen esomeprazole (NEXIUM) 40 MG capsule Take 40 mg by mouth 2 (two) times daily.        . fludrocortisone (FLORINEF) 0.1 MG tablet Take 0.1 mg by mouth daily.      Marland Kitchen HYDROcodone-acetaminophen (NORCO) 10-325 MG per tablet Take 2 tablets by mouth every 8 (eight) hours as needed for pain.  180 tablet  0   No current facility-administered medications for this visit.    No Known Allergies  Review of Systems negative except from HPI and PMH  Physical Exam BP 117/73  Pulse 116  Ht 5\' 7"  (1.702 m)  Wt 123 lb (55.792 kg)  BMI 19.26 kg/m2  SpO2 97% Well developed and nourished in no acute distress HENT normal Neck supple with JVP-flat Clear Regular rate and rhythm, no murmurs or gallops Abd-soft with active BS No Clubbing cyanosis edema Skin-warm and dry A & Oriented  Grossly normal sensory and motor function Affect sad and labile    Assessment and  Plan

## 2013-06-08 ENCOUNTER — Other Ambulatory Visit: Payer: Self-pay

## 2013-06-08 ENCOUNTER — Telehealth: Payer: Self-pay | Admitting: Neurology

## 2013-06-09 NOTE — Telephone Encounter (Signed)
Called patient and inform that [facility] only does lab for inpatients, she could use [facility] across the street and they will send to them or she may pick up, patient understood, faxed to [facility] lab,mailed patient a copy of the prescription as well.

## 2013-06-09 NOTE — Telephone Encounter (Signed)
Lab for BMP was written. Make sure that is all that is needed.

## 2013-06-15 ENCOUNTER — Encounter: Payer: Self-pay | Admitting: Physical Medicine and Rehabilitation

## 2013-06-15 ENCOUNTER — Encounter
Payer: Medicare Other | Attending: Physical Medicine and Rehabilitation | Admitting: Physical Medicine and Rehabilitation

## 2013-06-15 VITALS — BP 100/58 | HR 82 | Resp 14 | Ht 66.0 in | Wt 121.0 lb

## 2013-06-15 DIAGNOSIS — R002 Palpitations: Secondary | ICD-10-CM | POA: Insufficient documentation

## 2013-06-15 DIAGNOSIS — G950 Syringomyelia and syringobulbia: Secondary | ICD-10-CM | POA: Insufficient documentation

## 2013-06-15 DIAGNOSIS — M51379 Other intervertebral disc degeneration, lumbosacral region without mention of lumbar back pain or lower extremity pain: Secondary | ICD-10-CM | POA: Insufficient documentation

## 2013-06-15 DIAGNOSIS — F329 Major depressive disorder, single episode, unspecified: Secondary | ICD-10-CM | POA: Insufficient documentation

## 2013-06-15 DIAGNOSIS — M5137 Other intervertebral disc degeneration, lumbosacral region: Secondary | ICD-10-CM | POA: Insufficient documentation

## 2013-06-15 DIAGNOSIS — M25559 Pain in unspecified hip: Secondary | ICD-10-CM | POA: Insufficient documentation

## 2013-06-15 DIAGNOSIS — IMO0001 Reserved for inherently not codable concepts without codable children: Secondary | ICD-10-CM

## 2013-06-15 DIAGNOSIS — M47812 Spondylosis without myelopathy or radiculopathy, cervical region: Secondary | ICD-10-CM | POA: Insufficient documentation

## 2013-06-15 DIAGNOSIS — M47817 Spondylosis without myelopathy or radiculopathy, lumbosacral region: Secondary | ICD-10-CM

## 2013-06-15 DIAGNOSIS — I951 Orthostatic hypotension: Secondary | ICD-10-CM | POA: Insufficient documentation

## 2013-06-15 DIAGNOSIS — F411 Generalized anxiety disorder: Secondary | ICD-10-CM | POA: Insufficient documentation

## 2013-06-15 DIAGNOSIS — F3289 Other specified depressive episodes: Secondary | ICD-10-CM | POA: Insufficient documentation

## 2013-06-15 MED ORDER — HYDROCODONE-ACETAMINOPHEN 10-325 MG PO TABS: 2.0000 | ORAL_TABLET | Freq: Three times a day (TID) | ORAL | Status: DC | PRN

## 2013-06-15 NOTE — Progress Notes (Signed)
Subjective:    Patient ID: [name], female    DOB: 1976/03/23, 37 y.o.   MRN: [id number]  HPI The patient complains about chronic pain low back pain, which radiates into her abdomen on the left. The patient also complains about intermittend numbness and tingling in her feet.  The problem has been stable. The patient reports that she saw Dr. Lovell Sheehan the neurosurgeon, who recommended surgery for an annular tear of her L4-5 disc. The patient is not considering surgery at that time.  The patient also complains about right hip pain, when walking too long. She states, that she had some increased pain with the cold weather, now back to baseline. She reports that she has started to work out in her pool.  She also reports that she went to the ED, because of paresthesias , but every test showed up negative.  Pain Inventory Average Pain 8 Pain Right Now 6 My pain is intermittent, sharp, burning, dull, stabbing, tingling and aching  In the last 24 hours, has pain interfered with the following? General activity 8 Relation with others 9 Enjoyment of life 8 What TIME of day is your pain at its worst? morning and evening Sleep (in general) Fair  Pain is worse with: walking, bending, sitting, inactivity and standing Pain improves with: rest, heat/ice, therapy/exercise, medication and TENS Relief from Meds: 6  Mobility walk without assistance how many minutes can you walk? 15 Do you have any goals in this area?  yes  Function I need assistance with the following:  meal prep, household duties and shopping  Neuro/Psych weakness numbness tingling spasms dizziness confusion  Prior Studies Any changes since last visit?  no  Physicians involved in your care Any changes since last visit?  no   Family History  Problem Relation Age of Onset  . Diabetes type II Mother   . Colon polyps Mother   . Diverticulitis Mother   . Diabetes Mother   . Heart disease Mother   . Diabetes type  II Brother   . Uterine cancer    . Ovarian cancer     History   Social History  . Marital Status: Married    Spouse Name: N/A    Number of Children: 2  . Years of Education: N/A   Occupational History  . Disability    Social History Main Topics  . Smoking status: Former Smoker    Types: Cigarettes    Quit date: 06/30/2011  . Smokeless tobacco: Never Used  . Alcohol Use: No  . Drug Use: No  . Sexual Activity: Yes   Other Topics Concern  . None   Social History Narrative  . None   Past Surgical History  Procedure Laterality Date  . Cesarean section    . Partial hysterectomy    . Tonsillectomy    . Left elbow surgery     Past Medical History  Diagnosis Date  . Fibromyalgia   . Anxiety   . Depression   . Degenerative disc disease     Spinal, small syrinx  . Chronic pain     Dr. Wynn Banker  . GERD (gastroesophageal reflux disease)   . Esophageal stricture     Status post dilatation  . Syncope     Negative tilt table test  . Palpitations     No documented arrhythmias  . Hiatal hernia   . Internal hemorrhoids without mention of complication   . Personal history of colonic polyps 10/04/2000    HYPERPLASTIC  POLYP  . Irritable bowel syndrome   . POTS (postural orthostatic tachycardia syndrome)    BP 100/58  Pulse 82  Resp 14  Ht 5\' 6"  (1.676 m)  Wt 121 lb (54.885 kg)  BMI 19.54 kg/m2  SpO2 99%     Review of Systems  Constitutional: Positive for chills.  Neurological: Positive for dizziness, weakness and numbness.  Psychiatric/Behavioral: Positive for confusion.  All other systems reviewed and are negative.       Objective:   Physical Exam Constitutional: She is oriented to person, place, and time. She appears well-developed and well-nourished.  HENT:  Head: Normocephalic.  Neck: Neck supple.  Musculoskeletal: She exhibits tenderness.  Neurological: She is alert and oriented to person, place, and time.  Skin: Skin is dry.  Psychiatric: She  has a normal mood and affect.   Symmetric normal motor tone is noted throughout. Normal muscle bulk. Muscle testing reveals 5/5 muscle strength of the upper extremity, and 5/5 of the lower extremity. Full range of motion in upper and lower extremities, mild pain at end of ROM in right hip flexion. ROM of spine is not restricted. Fine motor movements are normal in both hands.  DTR in the upper and lower extremity are present and symmetric 1+. No clonus is noted.  Patient arises from chair without difficulty. Narrow based gait with normal arm swing bilateral , able to walk on heels and toes . Tandem walk is stable. No pronator drift. Rhomberg negative.  Good finger to nose and heel to shin testing. No tremor, dystaxia or dysmetria noted.         Assessment & Plan:  1. Lumbar degenerative disc, worse at L4-5, with buldging disc at this level.  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 Depression and anxiety seen by psychiatry  5. 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 Palpitations, advised patient to talk to her psychiatrist, whether there would be a different medication than Aderall, which does not have arrhythmias/palpitations as side effect.  6. Right hip pain, pain only with end of ROM into flexion, pain anterior/lateral, Ordered x-ray of right hip,because patient also has Hx of early osteopenia, X-rays were normal, no DJD no necrosis or any other significant findings.  7. Myalgia  Plan  Advised patient to be more active, start a walking program and doing some exercises, again. Also advised her to try to eat a healthy diet, talked about  Beneficial supplements like fish oil too!. I explained to the patient that being physically active would be very beneficial to her problems, 1.-6.  Refilled her hydrocodone, patient should follow up in 1 month

## 2013-06-15 NOTE — Patient Instructions (Signed)
Try to exercise, try to walk some

## 2013-06-20 ENCOUNTER — Other Ambulatory Visit: Payer: Self-pay | Admitting: Neurology

## 2013-06-20 LAB — BASIC METABOLIC PANEL
CO2: 28 mEq/L (ref 19–32)
Chloride: 106 mEq/L (ref 96–112)
Potassium: 4 mEq/L (ref 3.5–5.3)
Sodium: 141 mEq/L (ref 135–145)

## 2013-06-21 ENCOUNTER — Telehealth: Payer: Self-pay | Admitting: Neurology

## 2013-06-21 NOTE — Telephone Encounter (Signed)
Message copied by Demetrios Loll on Wed Jun 21, 2013  2:03 PM ------      Message from: [facility], [place]      Created: Wed Jun 21, 2013  1:51 PM       Normal electrolytes , potasium is normal now. CD ------

## 2013-06-21 NOTE — Telephone Encounter (Signed)
Called patient about her lab work, patient verbalized understanding.

## 2013-06-21 NOTE — Telephone Encounter (Signed)
Message copied by Demetrios Loll on Wed Jun 21, 2013 11:39 AM ------      Message from: Levert Feinstein      Created: Wed Jun 21, 2013 10:33 AM       Please call patient for normal laboratory result ------

## 2013-06-21 NOTE — Telephone Encounter (Signed)
Called patient and left message informing patient that her lab results were normal and if she has any questions or concerns to call the office.

## 2013-06-23 ENCOUNTER — Ambulatory Visit
Admission: RE | Admit: 2013-06-23 | Discharge: 2013-06-23 | Disposition: A | Discharge status: Home / Self Care | Payer: Medicare Other | Source: Ambulatory Visit | Attending: Neurology | Admitting: Neurology

## 2013-06-23 DIAGNOSIS — R413 Other amnesia: Secondary | ICD-10-CM

## 2013-06-23 DIAGNOSIS — F329 Major depressive disorder, single episode, unspecified: Secondary | ICD-10-CM

## 2013-06-23 DIAGNOSIS — F32A Depression, unspecified: Secondary | ICD-10-CM

## 2013-06-23 MED ORDER — GADOBENATE DIMEGLUMINE 529 MG/ML IV SOLN
10.0000 mL | Freq: Once | INTRAVENOUS | Status: AC | PRN
  Administered 2013-06-23: 10 mL via INTRAVENOUS

## 2013-07-06 ENCOUNTER — Other Ambulatory Visit: Payer: Self-pay | Admitting: *Deleted

## 2013-07-06 MED ORDER — HYDROCODONE-ACETAMINOPHEN 10-325 MG PO TABS: 2.0000 | ORAL_TABLET | Freq: Three times a day (TID) | ORAL | Status: DC | PRN

## 2013-07-06 NOTE — Telephone Encounter (Signed)
rx printed early for controlled medication for the visit with RN on 07/14/13 (to be signed by MD) 

## 2013-07-06 NOTE — Progress Notes (Signed)
Quick Note:  Shared normal MRI results per Dr Oliva Bustard findings, patient verbalized understanding ______

## 2013-07-13 DIAGNOSIS — Z0289 Encounter for other administrative examinations: Secondary | ICD-10-CM

## 2013-07-14 ENCOUNTER — Telehealth: Payer: Self-pay | Admitting: Physical Medicine & Rehabilitation

## 2013-07-14 NOTE — Telephone Encounter (Signed)
Pt picked up script

## 2013-07-18 ENCOUNTER — Encounter: Payer: Self-pay | Admitting: Internal Medicine

## 2013-07-18 ENCOUNTER — Ambulatory Visit (INDEPENDENT_AMBULATORY_CARE_PROVIDER_SITE_OTHER): Payer: Medicare Other | Admitting: Internal Medicine

## 2013-07-18 VITALS — BP 107/80 | HR 90 | Ht 66.0 in | Wt 125.8 lb

## 2013-07-18 DIAGNOSIS — R000 Tachycardia, unspecified: Secondary | ICD-10-CM

## 2013-07-18 DIAGNOSIS — G90A Postural orthostatic tachycardia syndrome (POTS): Secondary | ICD-10-CM

## 2013-07-18 DIAGNOSIS — F329 Major depressive disorder, single episode, unspecified: Secondary | ICD-10-CM

## 2013-07-18 DIAGNOSIS — I951 Orthostatic hypotension: Secondary | ICD-10-CM

## 2013-07-18 DIAGNOSIS — F3289 Other specified depressive episodes: Secondary | ICD-10-CM

## 2013-07-18 NOTE — Progress Notes (Signed)
      Patient Care Team: Elfredia Nevins, MD as PCP - General (Internal Medicine)   HPI  [name] is a 37 y.o. female Seen in followup of POTS with a history of postural intolerance and syncope.  Echo>>normal LV function  Her volume status is improved with now clear urine. She is drinking Gatorade. Urine sodium was 148. Try beta blockers; she failed to tolerate either the Inderal or nadolol   Family issues remain stressful with her chuldren particularly.  She is tearful and admits to depression. She has not followed up with restoration place.  She's had no recurrent syncope. Her episodes of presyncope are associated with standing.  She complains of anorexia and nausea.  Her weight is at its [place] was most wakes in the 52 kg region now at 57  Her maternal aunt has recently died of a  hematological disorder which is apparently hereditary; her mom may have the gene.         Past Medical History  Diagnosis Date  . Fibromyalgia   . Anxiety   . Depression   . Degenerative disc disease     Spinal, small syrinx  . Chronic pain     Dr. Wynn Banker  . GERD (gastroesophageal reflux disease)   . Esophageal stricture     Status post dilatation  . Syncope     Negative tilt table test  . Palpitations     No documented arrhythmias  . Hiatal hernia   . Internal hemorrhoids without mention of complication   . Personal history of colonic polyps 10/04/2000    HYPERPLASTIC POLYP  . Irritable bowel syndrome   . POTS (postural orthostatic tachycardia syndrome)     Past Surgical History  Procedure Laterality Date  . Cesarean section    . Partial hysterectomy    . Tonsillectomy    . Left elbow surgery      Current Outpatient Prescriptions  Medication Sig Dispense Refill  . amphetamine-dextroamphetamine (ADDERALL, 20MG ,) 20 MG tablet Take 20 mg by mouth daily.        Marland Kitchen aspirin EC 81 MG tablet Take 81 mg by mouth. When she remembers.       . clonazePAM (KLONOPIN) 2 MG tablet  Take 2 mg by mouth 4 (four) times daily.        Marland Kitchen esomeprazole (NEXIUM) 40 MG capsule Take 40 mg by mouth 2 (two) times daily.        . fludrocortisone (FLORINEF) 0.1 MG tablet Take 2 tablets (0.2 mg total) by mouth twice daily  120 tablet  2  . HYDROcodone-acetaminophen (NORCO) 10-325 MG per tablet Take 2 tablets by mouth every 8 (eight) hours as needed.  180 tablet  0   No current facility-administered medications for this visit.    No Known Allergies  Review of Systems negative except from HPI and PMH  Physical Exam BP 107/80  Pulse 90  Ht 5\' 6"  (1.676 m)  Wt 125 lb 12.8 oz (57.063 kg)  BMI 20.31 kg/m2 Well developed and well nourished in no acute distress HENT normal E scleral and icterus clear Neck Supple JVP flat; carotids brisk and full Clear to ausculation  Regular rate and rhythm, 2/6 to go to murmur Soft with active bowel sounds No clubbing cyanosis no Edema Alert and oriented, grossly normal motor and sensory function Skin Warm and Dry    Assessment and  Plan

## 2013-07-18 NOTE — Assessment & Plan Note (Signed)
enocurageda gain to seek counseling

## 2013-07-18 NOTE — Assessment & Plan Note (Signed)
We have reviewed isometric contraction in the role of support stockings to try to offset her orthostasis. Abdominal liners may also be of benefit.

## 2013-07-18 NOTE — Patient Instructions (Signed)
Your physician has recommended you make the following change in your medication:  1) Start taking Zofran 4 mg by mouth twice daily as needed for nausea.  Your physician recommends that you schedule a follow-up appointment in: 3 months with Dr. Graciela Husbands.   You were given prescription for compression stockings for orthostatic intolerance.

## 2013-07-18 NOTE — Assessment & Plan Note (Addendum)
As above We will try Zofran for complaints of postprandial nausea

## 2013-08-04 ENCOUNTER — Other Ambulatory Visit: Payer: Self-pay | Admitting: *Deleted

## 2013-08-04 MED ORDER — HYDROCODONE-ACETAMINOPHEN 10-325 MG PO TABS: 2.0000 | ORAL_TABLET | Freq: Three times a day (TID) | ORAL | Status: DC | PRN

## 2013-08-04 NOTE — Telephone Encounter (Signed)
RX printed early for controlled medication for the visit with RN on 08/09/13 (to be signed by MD)

## 2013-08-09 ENCOUNTER — Encounter: Payer: Medicare Other | Attending: Physical Medicine & Rehabilitation | Admitting: *Deleted

## 2013-08-09 DIAGNOSIS — M47812 Spondylosis without myelopathy or radiculopathy, cervical region: Secondary | ICD-10-CM | POA: Insufficient documentation

## 2013-08-09 DIAGNOSIS — I498 Other specified cardiac arrhythmias: Secondary | ICD-10-CM | POA: Insufficient documentation

## 2013-08-09 DIAGNOSIS — F411 Generalized anxiety disorder: Secondary | ICD-10-CM | POA: Insufficient documentation

## 2013-08-09 DIAGNOSIS — G950 Syringomyelia and syringobulbia: Secondary | ICD-10-CM | POA: Insufficient documentation

## 2013-08-09 DIAGNOSIS — F329 Major depressive disorder, single episode, unspecified: Secondary | ICD-10-CM | POA: Insufficient documentation

## 2013-08-09 DIAGNOSIS — M51379 Other intervertebral disc degeneration, lumbosacral region without mention of lumbar back pain or lower extremity pain: Secondary | ICD-10-CM | POA: Insufficient documentation

## 2013-08-09 DIAGNOSIS — IMO0001 Reserved for inherently not codable concepts without codable children: Secondary | ICD-10-CM | POA: Insufficient documentation

## 2013-08-09 DIAGNOSIS — M5137 Other intervertebral disc degeneration, lumbosacral region: Secondary | ICD-10-CM | POA: Insufficient documentation

## 2013-08-09 DIAGNOSIS — F3289 Other specified depressive episodes: Secondary | ICD-10-CM | POA: Insufficient documentation

## 2013-08-15 ENCOUNTER — Telehealth: Payer: Self-pay

## 2013-08-15 NOTE — Telephone Encounter (Signed)
Left message advising patient her hydrocodone rx was ready for pick up.

## 2013-08-15 NOTE — Telephone Encounter (Signed)
Patient called regarding her medication.  She missed her appointment because of having the flu.  She is out of medication.

## 2013-08-18 ENCOUNTER — Encounter: Payer: Self-pay | Admitting: Physical Medicine & Rehabilitation

## 2013-08-18 ENCOUNTER — Ambulatory Visit (HOSPITAL_BASED_OUTPATIENT_CLINIC_OR_DEPARTMENT_OTHER): Payer: Medicare Other | Admitting: Physical Medicine & Rehabilitation

## 2013-08-18 VITALS — BP 109/60 | HR 100 | Resp 14 | Ht 66.0 in | Wt 121.4 lb

## 2013-08-18 DIAGNOSIS — G950 Syringomyelia and syringobulbia: Secondary | ICD-10-CM

## 2013-08-18 DIAGNOSIS — IMO0001 Reserved for inherently not codable concepts without codable children: Secondary | ICD-10-CM

## 2013-08-18 DIAGNOSIS — I498 Other specified cardiac arrhythmias: Secondary | ICD-10-CM

## 2013-08-18 DIAGNOSIS — I951 Orthostatic hypotension: Principal | ICD-10-CM

## 2013-08-18 DIAGNOSIS — R000 Tachycardia, unspecified: Principal | ICD-10-CM

## 2013-08-18 DIAGNOSIS — M797 Fibromyalgia: Secondary | ICD-10-CM

## 2013-08-18 DIAGNOSIS — G90A Postural orthostatic tachycardia syndrome (POTS): Secondary | ICD-10-CM

## 2013-08-18 MED ORDER — HYDROCODONE-ACETAMINOPHEN 10-325 MG PO TABS: 2.0000 | ORAL_TABLET | Freq: Three times a day (TID) | ORAL | Status: DC | PRN

## 2013-08-18 NOTE — Patient Instructions (Signed)
Walk 10 min 3 times a day for exercise

## 2013-08-18 NOTE — Progress Notes (Signed)
Subjective:    Patient ID: [name], female    DOB: 10-15-1975, 38 y.o.   MRN: [id number]  HPI No daily exercise.  She discussed possibly joining the [facility] and going there with her son Feels depressed about her orthostatic hypotension, also feels like she is not contributing much to her family. Sees psychiatry, discussed counselor Pain Inventory Average Pain 7 Pain Right Now 7 My pain is intermittent, sharp, burning, dull and stabbing  In the last 24 hours, has pain interfered with the following? General activity 8 Relation with others 8 Enjoyment of life 8 What TIME of day is your pain at its worst? varies Sleep (in general) Fair  Pain is worse with: walking, bending, sitting, inactivity and standing Pain improves with: rest, heat/ice, pacing activities, medication and TENS Relief from Meds: 7  Mobility walk without assistance  Function disabled: date disabled . I need assistance with the following:  meal prep, household duties and shopping  Neuro/Psych numbness tremor tingling trouble walking spasms  Prior Studies Any changes since last visit?  no  Physicians involved in your care Any changes since last visit?  no   Family History  Problem Relation Age of Onset  . Diabetes type II Mother   . Colon polyps Mother   . Diverticulitis Mother   . Diabetes Mother   . Heart disease Mother   . Diabetes type II Brother   . Uterine cancer    . Ovarian cancer     History   Social History  . Marital Status: Married    Spouse Name: N/A    Number of Children: 2  . Years of Education: N/A   Occupational History  . Disability    Social History Main Topics  . Smoking status: Former Smoker    Types: Cigarettes    Quit date: 06/30/2011  . Smokeless tobacco: Never Used  . Alcohol Use: No  . Drug Use: No  . Sexual Activity: Yes   Other Topics Concern  . None   Social History Narrative  . None   Past Surgical History  Procedure Laterality Date  .  Cesarean section    . Partial hysterectomy    . Tonsillectomy    . Left elbow surgery     Past Medical History  Diagnosis Date  . Fibromyalgia   . Anxiety   . Depression   . Degenerative disc disease     Spinal, small syrinx  . Chronic pain     Dr. Letta Pate  . GERD (gastroesophageal reflux disease)   . Esophageal stricture     Status post dilatation  . Syncope     Negative tilt table test  . Palpitations     No documented arrhythmias  . Hiatal hernia   . Internal hemorrhoids without mention of complication   . Personal history of colonic polyps 10/04/2000    HYPERPLASTIC POLYP  . Irritable bowel syndrome   . POTS (postural orthostatic tachycardia syndrome)    BP 109/60  Pulse 100  Resp 14  Ht 5' 6"  (1.676 m)  Wt 121 lb 6.4 oz (55.067 kg)  BMI 19.60 kg/m2  SpO2 96%    Review of Systems  Musculoskeletal: Positive for gait problem.       Spasms  Neurological: Positive for tremors and numbness.       Tingling   All other systems reviewed and are negative.       Objective:   Physical Exam  Nursing note and vitals reviewed.  Constitutional: She is oriented to person, place, and time. She appears well-developed and well-nourished.  HENT:  Head: Normocephalic and atraumatic.  Eyes: Conjunctivae are normal.  Musculoskeletal: Normal range of motion.  Neurological: She is alert and oriented to person, place, and time.  Psychiatric: She has a normal mood and affect.   5/5 strength in bilateral deltoid, bicep, tricep, grip, hip flexor, knee extensors, ankle dorsiflexion plantar flexor Sensation normal in upper and lower limbs Deep tendon reflexes 1/5 bilateral patellar and Achilles        Assessment & Plan:  1.  Lumbar DDD with fibromyalgia syndrome  2.  Postural Orthostatic Tachycardia Syndrome-patient now has this diagnosis but is still learning to cope with it. She is looking into seeing a psychologist to help her with coping skills.  3 cervical spondylosis  without evidence of myelopathy  4. 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 Depression and anxiety seen by psychiatry  PA visit in one month

## 2013-08-30 ENCOUNTER — Telehealth: Payer: Self-pay | Admitting: *Deleted

## 2013-08-30 NOTE — Telephone Encounter (Signed)
Patient requests zofran to be sent to [facility]. Ok to send in? Thanks, MI

## 2013-08-31 ENCOUNTER — Other Ambulatory Visit: Payer: Self-pay | Admitting: *Deleted

## 2013-08-31 MED ORDER — ONDANSETRON HCL 4 MG PO TABS: 4.0000 mg | ORAL_TABLET | Freq: Two times a day (BID) | ORAL | Status: DC | PRN

## 2013-08-31 NOTE — Telephone Encounter (Signed)
Informed pt prescription sent to requested pharmacy in [place]. Pt verbalized understanding.

## 2013-09-15 ENCOUNTER — Encounter: Payer: Self-pay | Admitting: Physical Medicine & Rehabilitation

## 2013-09-15 ENCOUNTER — Ambulatory Visit (HOSPITAL_BASED_OUTPATIENT_CLINIC_OR_DEPARTMENT_OTHER): Payer: Medicare Other | Admitting: Physical Medicine & Rehabilitation

## 2013-09-15 ENCOUNTER — Encounter: Payer: Medicare Other | Attending: Physical Medicine & Rehabilitation

## 2013-09-15 VITALS — BP 105/53 | HR 102 | Resp 16 | Ht 66.0 in | Wt 121.0 lb

## 2013-09-15 DIAGNOSIS — M5137 Other intervertebral disc degeneration, lumbosacral region: Secondary | ICD-10-CM | POA: Insufficient documentation

## 2013-09-15 DIAGNOSIS — M797 Fibromyalgia: Secondary | ICD-10-CM

## 2013-09-15 DIAGNOSIS — F329 Major depressive disorder, single episode, unspecified: Secondary | ICD-10-CM | POA: Insufficient documentation

## 2013-09-15 DIAGNOSIS — I498 Other specified cardiac arrhythmias: Secondary | ICD-10-CM

## 2013-09-15 DIAGNOSIS — M47812 Spondylosis without myelopathy or radiculopathy, cervical region: Secondary | ICD-10-CM | POA: Insufficient documentation

## 2013-09-15 DIAGNOSIS — R000 Tachycardia, unspecified: Secondary | ICD-10-CM

## 2013-09-15 DIAGNOSIS — IMO0001 Reserved for inherently not codable concepts without codable children: Secondary | ICD-10-CM | POA: Insufficient documentation

## 2013-09-15 DIAGNOSIS — G90A Postural orthostatic tachycardia syndrome (POTS): Secondary | ICD-10-CM

## 2013-09-15 DIAGNOSIS — F411 Generalized anxiety disorder: Secondary | ICD-10-CM | POA: Insufficient documentation

## 2013-09-15 DIAGNOSIS — G950 Syringomyelia and syringobulbia: Secondary | ICD-10-CM | POA: Insufficient documentation

## 2013-09-15 DIAGNOSIS — I951 Orthostatic hypotension: Secondary | ICD-10-CM

## 2013-09-15 DIAGNOSIS — M519 Unspecified thoracic, thoracolumbar and lumbosacral intervertebral disc disorder: Secondary | ICD-10-CM

## 2013-09-15 DIAGNOSIS — M51379 Other intervertebral disc degeneration, lumbosacral region without mention of lumbar back pain or lower extremity pain: Secondary | ICD-10-CM | POA: Insufficient documentation

## 2013-09-15 DIAGNOSIS — F3289 Other specified depressive episodes: Secondary | ICD-10-CM | POA: Insufficient documentation

## 2013-09-15 MED ORDER — HYDROCODONE-ACETAMINOPHEN 10-325 MG PO TABS: 2.0000 | ORAL_TABLET | Freq: Three times a day (TID) | ORAL | Status: DC | PRN

## 2013-09-15 NOTE — Progress Notes (Signed)
Subjective:    Patient ID: [name], female    DOB: 09/23/1975, 38 y.o.   MRN: [id number]  HPI Mom had heart attack requiring stents also with DVT, now on warfarin.  Pt has been and out of hospital for the last month .  She is also assisting her mother with medications and other needs.    Patient has no new medical issues compared to prior visit Pain Inventory Average Pain 7 Pain Right Now 6 My pain is intermittent, sharp, burning, dull, stabbing, tingling and aching  In the last 24 hours, has pain interfered with the following? General activity 7 Relation with others 7 Enjoyment of life 8 What TIME of day is your pain at its worst? varies Sleep (in general) Fair  Pain is worse with: walking, bending, sitting, inactivity and standing Pain improves with: rest, heat/ice, medication and TENS Relief from Meds: 7  Mobility walk without assistance ability to climb steps?  yes do you drive?  yes  Function disabled: date disabled . I need assistance with the following:  meal prep, household duties and shopping  Neuro/Psych weakness numbness tremor tingling trouble walking spasms dizziness confusion depression anxiety  Prior Studies Any changes since last visit?  no  Physicians involved in your care Any changes since last visit?  no   Family History  Problem Relation Age of Onset  . Diabetes type II Mother   . Colon polyps Mother   . Diverticulitis Mother   . Diabetes Mother   . Heart disease Mother   . Diabetes type II Brother   . Uterine cancer    . Ovarian cancer     History   Social History  . Marital Status: Married    Spouse Name: N/A    Number of Children: 2  . Years of Education: N/A   Occupational History  . Disability    Social History Main Topics  . Smoking status: Former Smoker    Types: Cigarettes    Quit date: 06/30/2011  . Smokeless tobacco: Never Used  . Alcohol Use: No  . Drug Use: No  . Sexual Activity: Yes   Other  Topics Concern  . None   Social History Narrative  . None   Past Surgical History  Procedure Laterality Date  . Cesarean section    . Partial hysterectomy    . Tonsillectomy    . Left elbow surgery     Past Medical History  Diagnosis Date  . Fibromyalgia   . Anxiety   . Depression   . Degenerative disc disease     Spinal, small syrinx  . Chronic pain     Dr. Letta Pate  . GERD (gastroesophageal reflux disease)   . Esophageal stricture     Status post dilatation  . Syncope     Negative tilt table test  . Palpitations     No documented arrhythmias  . Hiatal hernia   . Internal hemorrhoids without mention of complication   . Personal history of colonic polyps 10/04/2000    HYPERPLASTIC POLYP  . Irritable bowel syndrome   . POTS (postural orthostatic tachycardia syndrome)    BP 105/53  Pulse 102  Resp 16  Ht 5' 6"  (1.676 m)  Wt 121 lb (54.885 kg)  BMI 19.54 kg/m2  SpO2 98%  Opioid Risk Score:   Fall Risk Score: Low Fall Risk (0-5 points) (educated and fall prevention in home handout given)  Review of Systems  Gastrointestinal: Positive for nausea.  Musculoskeletal: Positive for back pain, gait problem and neck pain.       Spasms  Neurological: Positive for dizziness, tremors, weakness and numbness.       Tingling  Psychiatric/Behavioral: Positive for confusion and dysphoric mood. The patient is nervous/anxious.   All other systems reviewed and are negative.       Objective:   Physical Exam  Nursing note and vitals reviewed. Constitutional: She is oriented to person, place, and time. She appears well-developed and well-nourished.  HENT:  Head: Normocephalic and atraumatic.  Eyes: Conjunctivae and EOM are normal. Pupils are equal, round, and reactive to light.  Neurological: She is alert and oriented to person, place, and time. She has normal strength. No cranial nerve deficit. She exhibits normal muscle tone. Gait normal.  Reflex Scores:      Tricep  reflexes are 1+ on the right side and 1+ on the left side.      Bicep reflexes are 1+ on the right side and 1+ on the left side.      Brachioradialis reflexes are 1+ on the right side and 1+ on the left side.      Patellar reflexes are 1+ on the right side and 1+ on the left side.      Achilles reflexes are 1+ on the right side and 1+ on the left side. Psychiatric: She has a normal mood and affect.          Assessment & Plan:  1. Lumbar DDD with fibromyalgia syndrome  2. Postural Orthostatic Tachycardia Syndrome-patient now has this diagnosis but is still learning to cope with it. She is looking into seeing a psychologist to help her with coping skills.  3 cervical spondylosis without evidence of myelopathy  4. 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 Depression and anxiety seen by psychiatry, Klonopin but also on Adderall  Return to clinic one month PA visit

## 2013-09-21 ENCOUNTER — Ambulatory Visit: Payer: Medicare Other | Admitting: Physical Medicine and Rehabilitation

## 2013-10-10 ENCOUNTER — Other Ambulatory Visit: Payer: Self-pay | Admitting: *Deleted

## 2013-10-10 MED ORDER — HYDROCODONE-ACETAMINOPHEN 10-325 MG PO TABS: 2.0000 | ORAL_TABLET | Freq: Three times a day (TID) | ORAL | Status: DC | PRN

## 2013-10-10 NOTE — Telephone Encounter (Signed)
RX printed for MD to sign for RN visit 10/12/13

## 2013-10-13 ENCOUNTER — Encounter: Payer: Medicare Other | Attending: Physical Medicine & Rehabilitation | Admitting: *Deleted

## 2013-10-17 ENCOUNTER — Ambulatory Visit: Payer: Medicare Other | Admitting: Internal Medicine

## 2013-10-18 ENCOUNTER — Encounter: Payer: Self-pay | Admitting: Internal Medicine

## 2013-11-17 ENCOUNTER — Encounter: Payer: Self-pay | Admitting: Registered Nurse

## 2013-11-17 ENCOUNTER — Encounter: Payer: Medicare Other | Attending: Physical Medicine & Rehabilitation | Admitting: Registered Nurse

## 2013-11-17 VITALS — BP 106/66 | HR 87 | Resp 14 | Ht 66.0 in | Wt 124.0 lb

## 2013-11-17 DIAGNOSIS — Z5181 Encounter for therapeutic drug level monitoring: Secondary | ICD-10-CM | POA: Insufficient documentation

## 2013-11-17 DIAGNOSIS — M519 Unspecified thoracic, thoracolumbar and lumbosacral intervertebral disc disorder: Secondary | ICD-10-CM | POA: Insufficient documentation

## 2013-11-17 DIAGNOSIS — M47812 Spondylosis without myelopathy or radiculopathy, cervical region: Secondary | ICD-10-CM

## 2013-11-17 DIAGNOSIS — I498 Other specified cardiac arrhythmias: Secondary | ICD-10-CM

## 2013-11-17 DIAGNOSIS — Z79899 Other long term (current) drug therapy: Secondary | ICD-10-CM | POA: Insufficient documentation

## 2013-11-17 DIAGNOSIS — IMO0001 Reserved for inherently not codable concepts without codable children: Secondary | ICD-10-CM | POA: Insufficient documentation

## 2013-11-17 MED ORDER — HYDROCODONE-ACETAMINOPHEN 10-325 MG PO TABS: 2.0000 | ORAL_TABLET | Freq: Three times a day (TID) | ORAL | Status: DC | PRN

## 2013-11-17 NOTE — Progress Notes (Signed)
Subjective:    Patient ID: [name], female    DOB: November 13, 1975, 38 y.o.   MRN: [id number]  HPI: [name] is a 38 year old female who returns for follow up for chronic pain and medication refill. Her pain is located mid to lower back. At times her right leg and foot tingles. No tingling or numbness at this time.  She noticed with the cold weather her pain intensifies. She uses heat therapy and TENS Unit which are effective. She rates her pain a 6. She doesn't do any exercise regime. She's living a sedentary lifestyle. I have encouraged her to start exercising and she's going to consider volunteering at a senior facility.  Pain Inventory  Average Pain 7  Pain Right Now 6  My pain is intermittent, sharp, burning, dull, stabbing, tingling and aching  In the last 24 hours, has pain interfered with the following?  General activity 7  Relation with others 7  Enjoyment of life 8  What TIME of day is your pain at its worst? varies  Sleep (in general) Fair  Pain is worse with: walking, bending, sitting, inactivity and standing  Pain improves with: rest, heat/ice, medication and TENS  Relief from Meds: 7  Mobility  walk without assistance  ability to climb steps? yes  do you drive? yes  Function  disabled: date disabled .  I need assistance with the following: meal prep, household duties and shopping  Neuro/Psych  weakness  numbness  tremor  tingling  trouble walking  spasms  dizziness  confusion  depression  anxiety  Prior Studies  Any changes since last visit? no  Physicians involved in your care  Any changes since last visit? no   Family History  Problem Relation Age of Onset  . Diabetes type II Mother   . Colon polyps Mother   . Diverticulitis Mother   . Diabetes Mother   . Heart disease Mother   . Diabetes type II Brother   . Uterine cancer    . Ovarian cancer     History   Social History  . Marital Status: Married    Spouse Name: N/A   Number of Children: 2  . Years of Education: N/A   Occupational History  . Disability    Social History Main Topics  . Smoking status: Former Smoker    Types: Cigarettes    Quit date: 06/30/2011  . Smokeless tobacco: Never Used  . Alcohol Use: No  . Drug Use: No  . Sexual Activity: Yes   Other Topics Concern  . None   Social History Narrative  . None   Past Surgical History  Procedure Laterality Date  . Cesarean section    . Partial hysterectomy    . Tonsillectomy    . Left elbow surgery     Past Medical History  Diagnosis Date  . Fibromyalgia   . Anxiety   . Depression   . Degenerative disc disease     Spinal, small syrinx  . Chronic pain     Dr. Letta Pate  . GERD (gastroesophageal reflux disease)   . Esophageal stricture     Status post dilatation  . Syncope     Negative tilt table test  . Palpitations     No documented arrhythmias  . Hiatal hernia   . Internal hemorrhoids without mention of complication   . Personal history of colonic polyps 10/04/2000    HYPERPLASTIC POLYP  . Irritable bowel syndrome   .  POTS (postural orthostatic tachycardia syndrome)    BP 106/66  Pulse 87  Resp 14  Ht 5' 6"  (1.676 m)  Wt 124 lb (56.246 kg)  BMI 20.02 kg/m2  SpO2 97%  Opioid Risk Score:   Fall Risk Score:     Review of Systems  Musculoskeletal: Positive for arthralgias, gait problem and myalgias.  Neurological: Positive for dizziness, tremors, weakness and numbness.  Psychiatric/Behavioral: Positive for confusion and dysphoric mood. The patient is nervous/anxious.   All other systems reviewed and are negative.      Objective:   Physical Exam  Nursing note and vitals reviewed. Constitutional: She is oriented to person, place, and time. She appears well-developed and well-nourished.  HENT:  Head: Normocephalic.  Neck: Normal range of motion. Neck supple.  Cardiovascular: Normal rate and regular rhythm.   Pulmonary/Chest: Effort normal and breath sounds  normal.  Musculoskeletal: Normal range of motion.  Normal Muscle Bulk: Muscle Testing Reveals:  5/5 muscle strength of upper and 5/5 of lower extremity. Normal ROM of spine 90 degrees.  No pain or Tenderness noted to spine.  Neurological: She is alert and oriented to person, place, and time.  Skin: Skin is warm and dry.  Psychiatric: She has a normal mood and affect.          Assessment & Plan:  1. Lumbar degenerative disc disease with Fibromylagia Syndrome: Refilled: HYDROcodone 10/325 mg two tablets every 8 hours as needed #180 2 cervical spondylosis without evidence of myelopathy  3.Depression and anxiety seen by psychiatry  4. Orthostatic hypotension:  .Cardiology Following   20 minutes of face to face patient care time was spent during this visit. All questions were encouraged and answered.

## 2013-11-27 ENCOUNTER — Other Ambulatory Visit: Payer: Self-pay | Admitting: Physical Medicine & Rehabilitation

## 2013-11-27 NOTE — Progress Notes (Signed)
Urine drug screen results from 11/17/2013 was consistent.

## 2013-12-14 ENCOUNTER — Encounter: Payer: Medicare Other | Admitting: Registered Nurse

## 2013-12-18 ENCOUNTER — Encounter: Payer: Self-pay | Admitting: Registered Nurse

## 2013-12-18 ENCOUNTER — Encounter: Payer: Medicare Other | Attending: Physical Medicine & Rehabilitation | Admitting: Registered Nurse

## 2013-12-18 VITALS — BP 126/53 | HR 94 | Resp 14 | Ht 66.0 in | Wt 126.0 lb

## 2013-12-18 DIAGNOSIS — Z79899 Other long term (current) drug therapy: Secondary | ICD-10-CM

## 2013-12-18 DIAGNOSIS — IMO0001 Reserved for inherently not codable concepts without codable children: Secondary | ICD-10-CM

## 2013-12-18 DIAGNOSIS — I498 Other specified cardiac arrhythmias: Secondary | ICD-10-CM

## 2013-12-18 DIAGNOSIS — M519 Unspecified thoracic, thoracolumbar and lumbosacral intervertebral disc disorder: Secondary | ICD-10-CM

## 2013-12-18 DIAGNOSIS — Z5181 Encounter for therapeutic drug level monitoring: Secondary | ICD-10-CM

## 2013-12-18 DIAGNOSIS — M47812 Spondylosis without myelopathy or radiculopathy, cervical region: Secondary | ICD-10-CM

## 2013-12-18 MED ORDER — HYDROCODONE-ACETAMINOPHEN 10-325 MG PO TABS: 2.0000 | ORAL_TABLET | Freq: Three times a day (TID) | ORAL | Status: DC | PRN

## 2013-12-18 NOTE — Progress Notes (Signed)
Subjective:    Patient ID: [name], female    DOB: July 01, 1976, 38 y.o.   MRN: [id number]  HPI: [name] is a 38 year old female who returns for follow up for chronic pain and medication refiill. She says her pain is located in her head, neck, lower back and right leg. She rates her pain 8. She doesn't follow an exercise regime. She is complaining about a headache it started yesterday she hasn't taken anything for it. She says I used to be on [place] but my insurance doesn't cover it anymore. She says she will be following up with her PMD. She has started sitting with her great-great grandmother  whose 80 year old. She was encouraged to continue to get involved with outside activities she verbalizes understanding. She says around 11/20/2013 she had a syncope episode. She was walking to the bathroom and blacked out, her husband helped her up. She didn't seek any medical attention. She has a history of syncope episodes and POTS, she was encouraged to F/U with cardiology and she verbalizes understanding. Pain Inventory Average Pain 6 Pain Right Now 7 My pain is sharp, burning, dull, stabbing, tingling and aching  In the last 24 hours, has pain interfered with the following? General activity 7 Relation with others 8 Enjoyment of life 8 What TIME of day is your pain at its worst? morning, evening, night Sleep (in general) Fair  Pain is worse with: walking, bending, sitting, inactivity, standing and some activites Pain improves with: rest, medication and TENS Relief from Meds: 7  Mobility walk without assistance how many minutes can you walk? 15 or less ability to climb steps?  yes do you drive?  yes transfers alone  Function disabled: date disabled na I need assistance with the following:  household duties and shopping  Neuro/Psych weakness numbness tremor tingling trouble walking spasms dizziness confusion depression anxiety  Prior Studies Any  changes since last visit?  no  Physicians involved in your care Any changes since last visit?  no   Family History  Problem Relation Age of Onset  . Diabetes type II Mother   . Colon polyps Mother   . Diverticulitis Mother   . Diabetes Mother   . Heart disease Mother   . Diabetes type II Brother   . Uterine cancer    . Ovarian cancer     History   Social History  . Marital Status: Married    Spouse Name: N/A    Number of Children: 2  . Years of Education: N/A   Occupational History  . Disability    Social History Main Topics  . Smoking status: Former Smoker    Types: Cigarettes    Quit date: 06/30/2011  . Smokeless tobacco: Never Used  . Alcohol Use: No  . Drug Use: No  . Sexual Activity: Yes   Other Topics Concern  . None   Social History Narrative  . None   Past Surgical History  Procedure Laterality Date  . Cesarean section    . Partial hysterectomy    . Tonsillectomy    . Left elbow surgery     Past Medical History  Diagnosis Date  . Fibromyalgia   . Anxiety   . Depression   . Degenerative disc disease     Spinal, small syrinx  . Chronic pain     Dr. Letta Pate  . GERD (gastroesophageal reflux disease)   . Esophageal stricture     Status post  dilatation  . Syncope     Negative tilt table test  . Palpitations     No documented arrhythmias  . Hiatal hernia   . Internal hemorrhoids without mention of complication   . Personal history of colonic polyps 10/04/2000    HYPERPLASTIC POLYP  . Irritable bowel syndrome   . POTS (postural orthostatic tachycardia syndrome)    BP 126/53  Pulse 94  Resp 14  Ht 5' 6"  (1.676 m)  Wt 126 lb (57.153 kg)  BMI 20.35 kg/m2  SpO2 99%  Opioid Risk Score:   Fall Risk Score: Moderate Fall Risk (6-13 points) (pt educated and given a brochure on fall risk)    Review of Systems  Constitutional: Positive for chills and appetite change.  Respiratory: Positive for shortness of breath.   Musculoskeletal:  Positive for gait problem.  Skin: Positive for rash.  Neurological: Positive for syncope, weakness and numbness.       Tingling, spasms  Psychiatric/Behavioral: Positive for confusion. The patient is nervous/anxious.        Depression  All other systems reviewed and are negative.      Objective:   Physical Exam  Nursing note and vitals reviewed. Constitutional: She is oriented to person, place, and time. She appears well-developed and well-nourished.  HENT:  Head: Normocephalic and atraumatic.  Neck: Normal range of motion. Neck supple.  Cardiovascular: Normal rate, regular rhythm and normal heart sounds.   Pulmonary/Chest: Effort normal and breath sounds normal.  Musculoskeletal:  Normal Muscle Bulk and Muscle Testing Reveals: Upper Extremities: Decreased ROM with abduction and adduction. and Muscle Strength 5/5 AC-Joint Tenderness Noted, Negative Neer's Test. Thoracic Paraspinal Tenderness noted: T-6- T-8. Lower Extremities: Full ROM and Muscle Strength 5/5. Arises from chair with ease. Narrow based gait.  Neurological: She is alert and oriented to person, place, and time.  Skin:  While she was sitting in erect position cyanosis noted to bilateral feet. She raised her leg and color return to bilateral feet.  Psychiatric:  Affect: Flat          Assessment & Plan:  1. 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 3. Fibromyalgia: Continue with Heat and exercise therapy.  4.Depression and anxiety: Psychiatry Following 5. Orthostatic hypotension: .Cardiology Following  6. Syncope: Had a syncope episode last month, F/U with Cardiology.  20 minutes of face to face patient care time was spent during this visit. All questions were encouraged and answered.  F/U in 1 month

## 2014-01-15 ENCOUNTER — Encounter: Payer: Medicare Other | Attending: Physical Medicine & Rehabilitation | Admitting: Registered Nurse

## 2014-01-15 ENCOUNTER — Encounter: Payer: Self-pay | Admitting: Registered Nurse

## 2014-01-15 VITALS — BP 117/64 | HR 99 | Resp 14 | Wt 122.0 lb

## 2014-01-15 DIAGNOSIS — M47812 Spondylosis without myelopathy or radiculopathy, cervical region: Secondary | ICD-10-CM | POA: Insufficient documentation

## 2014-01-15 DIAGNOSIS — IMO0001 Reserved for inherently not codable concepts without codable children: Secondary | ICD-10-CM | POA: Insufficient documentation

## 2014-01-15 DIAGNOSIS — Z79899 Other long term (current) drug therapy: Secondary | ICD-10-CM

## 2014-01-15 DIAGNOSIS — M519 Unspecified thoracic, thoracolumbar and lumbosacral intervertebral disc disorder: Secondary | ICD-10-CM | POA: Insufficient documentation

## 2014-01-15 DIAGNOSIS — Z5181 Encounter for therapeutic drug level monitoring: Secondary | ICD-10-CM | POA: Insufficient documentation

## 2014-01-15 DIAGNOSIS — I498 Other specified cardiac arrhythmias: Secondary | ICD-10-CM | POA: Insufficient documentation

## 2014-01-15 MED ORDER — HYDROCODONE-ACETAMINOPHEN 10-325 MG PO TABS: 2.0000 | ORAL_TABLET | Freq: Three times a day (TID) | ORAL | Status: DC | PRN

## 2014-01-15 NOTE — Progress Notes (Signed)
Subjective:    Patient ID: [name], female    DOB: 15-Dec-1975, 38 y.o.   MRN: [id number]  HPI: [name] is a 38 year old female who returns for follow up for chronic pain and medication refill. She says her pain is located in her right shoulder, mid- lower back. She rates her pain 7. At times she has jaw pain, no jaw pain at this time. Her current exercise regime is walking some. She will be starting pool therapy soon.She's still sitting with her great-great grandmother three times a week. She has been encouraged to continue to increase her activity level. She verbalizes understanding.   She's tearful today, denies feeling depressed. Emotional support given.    Pain Inventory Average Pain 7 Pain Right Now 7 My pain is intermittent, sharp, burning, dull, stabbing, tingling and aching  In the last 24 hours, has pain interfered with the following? General activity 8 Relation with others 8 Enjoyment of life 8 What TIME of day is your pain at its worst? all Sleep (in general) Fair  Pain is worse with: walking, bending, sitting, inactivity, standing and some activites Pain improves with: rest, heat/ice, medication and TENS Relief from Meds: 6  Mobility walk without assistance how many minutes can you walk? 10 ability to climb steps?  yes do you drive?  yes  Function I need assistance with the following:  meal prep, household duties and shopping  Neuro/Psych weakness numbness tremor tingling trouble walking spasms dizziness confusion depression anxiety  Prior Studies Any changes since last visit?  no  Physicians involved in your care Any changes since last visit?  no   Family History  Problem Relation Age of Onset  . Diabetes type II Mother   . Colon polyps Mother   . Diverticulitis Mother   . Diabetes Mother   . Heart disease Mother   . Diabetes type II Brother   . Uterine cancer    . Ovarian cancer     History   Social History  .  Marital Status: Married    Spouse Name: N/A    Number of Children: 2  . Years of Education: N/A   Occupational History  . Disability    Social History Main Topics  . Smoking status: Former Smoker    Types: Cigarettes    Quit date: 06/30/2011  . Smokeless tobacco: Never Used  . Alcohol Use: No  . Drug Use: No  . Sexual Activity: Yes   Other Topics Concern  . None   Social History Narrative  . None   Past Surgical History  Procedure Laterality Date  . Cesarean section    . Partial hysterectomy    . Tonsillectomy    . Left elbow surgery     Past Medical History  Diagnosis Date  . Fibromyalgia   . Anxiety   . Depression   . Degenerative disc disease     Spinal, small syrinx  . Chronic pain     Dr. Letta Pate  . GERD (gastroesophageal reflux disease)   . Esophageal stricture     Status post dilatation  . Syncope     Negative tilt table test  . Palpitations     No documented arrhythmias  . Hiatal hernia   . Internal hemorrhoids without mention of complication   . Personal history of colonic polyps 10/04/2000    HYPERPLASTIC POLYP  . Irritable bowel syndrome   . POTS (postural orthostatic tachycardia syndrome)  Opioid Risk Score:   Fall Risk Score: High Fall Risk (>13 points) (high risk due to black out episodes and POTS/ previously educated and handout given for fall prevention in the home) Review of Systems  Constitutional: Positive for chills, diaphoresis, appetite change and unexpected weight change.       Hands are very cold-she feels cold and outside temps in mid 90's  Respiratory: Positive for shortness of breath.   Gastrointestinal: Positive for nausea, abdominal pain, diarrhea and constipation.  Musculoskeletal: Positive for back pain and neck pain.       Spasms  Neurological: Positive for dizziness, tremors, weakness and numbness.       Tingling  Psychiatric/Behavioral: Positive for confusion and dysphoric mood. The patient is nervous/anxious.     All other systems reviewed and are negative.      Objective:   Physical Exam  Nursing note and vitals reviewed. Constitutional: She is oriented to person, place, and time. She appears well-developed.  HENT:  Head: Normocephalic and atraumatic.  Neck: Normal range of motion. Neck supple.  Cardiovascular: Normal rate, regular rhythm and normal heart sounds.   Pulmonary/Chest: Effort normal and breath sounds normal.  Musculoskeletal:  Normal Muscle Bulk and Muscle Testing Reveals: Upper Extremities: Full ROM and Muscle Strength 5/5 Back without spinal or paraspinal tenderness Lower Extremities: Full ROM and Muscle Strength 5/5 Arises from chair with ease. Narrow based Gait  Neurological: She is alert and oriented to person, place, and time.  Skin: Skin is warm and dry.  Psychiatric: She has a normal mood and affect.          Assessment & Plan:  1. 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  3. Fibromyalgia: Continue with Heat and exercise therapy.  4.Depression and anxiety: Psychiatry Following  5. Orthostatic hypotension: .Cardiology Following   20 minutes of face to face patient care time was spent during this visit. All questions were encouraged and answered.   F/U in 1 month

## 2014-02-13 ENCOUNTER — Encounter: Payer: Medicare Other | Admitting: Registered Nurse

## 2014-02-16 ENCOUNTER — Encounter: Payer: Self-pay | Admitting: Registered Nurse

## 2014-02-16 ENCOUNTER — Encounter: Payer: Medicare Other | Attending: Physical Medicine & Rehabilitation | Admitting: Registered Nurse

## 2014-02-16 VITALS — BP 122/70 | HR 84 | Resp 14 | Wt 123.4 lb

## 2014-02-16 DIAGNOSIS — M519 Unspecified thoracic, thoracolumbar and lumbosacral intervertebral disc disorder: Secondary | ICD-10-CM | POA: Insufficient documentation

## 2014-02-16 DIAGNOSIS — I498 Other specified cardiac arrhythmias: Secondary | ICD-10-CM

## 2014-02-16 DIAGNOSIS — Z5181 Encounter for therapeutic drug level monitoring: Secondary | ICD-10-CM

## 2014-02-16 DIAGNOSIS — M47812 Spondylosis without myelopathy or radiculopathy, cervical region: Secondary | ICD-10-CM

## 2014-02-16 DIAGNOSIS — IMO0001 Reserved for inherently not codable concepts without codable children: Secondary | ICD-10-CM | POA: Insufficient documentation

## 2014-02-16 DIAGNOSIS — Z79899 Other long term (current) drug therapy: Secondary | ICD-10-CM | POA: Insufficient documentation

## 2014-02-16 MED ORDER — HYDROCODONE-ACETAMINOPHEN 10-325 MG PO TABS: 2.0000 | ORAL_TABLET | Freq: Three times a day (TID) | ORAL | Status: DC | PRN

## 2014-02-16 NOTE — Progress Notes (Signed)
Subjective:    Patient ID: [name], female    DOB: [birth year], 38 y.o.   MRN: [id number]  HPI: [name] is a 37 year old female who returns for follow up for chronic pain and medication refill. She says her pain is located in her right shoulder, mid- lower back. She rates her pain 5. Her current exercise is  pool therapy three times a week. She's still sitting with her great-great grandmother three times a week. She has been encouraged to continue to increase her activity level. She verbalizes understanding.   Pain Inventory Average Pain 7 Pain Right Now 5 My pain is intermittent, sharp, burning, dull, stabbing, tingling and aching  In the last 24 hours, has pain interfered with the following? General activity 7 Relation with others 8 Enjoyment of life 8 What TIME of day is your pain at its worst? morning and evening Sleep (in general) Fair  Pain is worse with: walking, bending, sitting, inactivity, standing and some activites Pain improves with: rest, heat/ice, medication and TENS Relief from Meds: 7  Mobility walk without assistance ability to climb steps?  yes do you drive?  yes  Function I need assistance with the following:  meal prep, household duties and shopping  Neuro/Psych weakness numbness tremor tingling trouble walking spasms dizziness confusion depression anxiety  Prior Studies Any changes since last visit?  no  Physicians involved in your care Any changes since last visit?  no   Family History  Problem Relation Age of Onset  . Diabetes type II Mother   . Colon polyps Mother   . Diverticulitis Mother   . Diabetes Mother   . Heart disease Mother   . Diabetes type II Brother   . Uterine cancer    . Ovarian cancer     History   Social History  . Marital Status: Married    Spouse Name: N/A    Number of Children: 2  . Years of Education: N/A   Occupational History  . Disability    Social History Main Topics  .  Smoking status: Former Smoker    Types: Cigarettes    Quit date: 06/30/2011  . Smokeless tobacco: Never Used  . Alcohol Use: No  . Drug Use: No  . Sexual Activity: Yes   Other Topics Concern  . None   Social History Narrative  . None   Past Surgical History  Procedure Laterality Date  . Cesarean section    . Partial hysterectomy    . Tonsillectomy    . Left elbow surgery     Past Medical History  Diagnosis Date  . Fibromyalgia   . Anxiety   . Depression   . Degenerative disc disease     Spinal, small syrinx  . Chronic pain     Dr. Letta Pate  . GERD (gastroesophageal reflux disease)   . Esophageal stricture     Status post dilatation  . Syncope     Negative tilt table test  . Palpitations     No documented arrhythmias  . Hiatal hernia   . Internal hemorrhoids without mention of complication   . Personal history of colonic polyps 10/04/2000    HYPERPLASTIC POLYP  . Irritable bowel syndrome   . POTS (postural orthostatic tachycardia syndrome)    BP 122/70  Pulse 84  Resp 14  Wt 123 lb 6.4 oz (55.974 kg)  SpO2 100%  Opioid Risk Score:   Fall Risk Score: High Fall Risk (>13  points) (previously educated and given handout for fall prevention) Review of Systems  Musculoskeletal: Positive for gait problem.       Spasms  Neurological: Positive for dizziness, tremors, weakness and numbness.       Tingling  Psychiatric/Behavioral: Positive for confusion and dysphoric mood. The patient is nervous/anxious.   All other systems reviewed and are negative.      Objective:   Physical Exam  Nursing note and vitals reviewed. Constitutional: She is oriented to person, place, and time. She appears well-developed and well-nourished.  HENT:  Head: Normocephalic and atraumatic.  Neck: Normal range of motion. Neck supple.  Cardiovascular: Normal rate and regular rhythm.   Pulmonary/Chest: Effort normal and breath sounds normal.  Musculoskeletal:  Normal Muscle bulk and  Muscle testing Reveals: Upper Extremities: Full ROM and Muscle Strength 5/5 Spinal Forward Flexion: 45 Degrees and Extension 20 Degrees Lower Extremities: Full ROM and Muscle strength 5/5 Arises from chair with ease Narrow Based gait  Neurological: She is alert and oriented to person, place, and time.  Skin: Skin is warm and dry.  Psychiatric: She has a normal mood and affect.          Assessment & Plan:  1. 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  3. Fibromyalgia: Continue with Heat and exercise therapy.  4.Depression and anxiety: Psychiatry Following  5. Orthostatic hypotension: .Cardiology Following   20 minutes of face to face patient care time was spent during this visit. All questions were encouraged and answered.   F/U in 1 month

## 2014-03-22 ENCOUNTER — Encounter: Payer: Self-pay | Admitting: Registered Nurse

## 2014-03-22 ENCOUNTER — Encounter: Payer: Medicare Other | Attending: Physical Medicine & Rehabilitation | Admitting: Registered Nurse

## 2014-03-22 VITALS — BP 108/76 | HR 114 | Resp 14 | Ht 66.0 in | Wt 120.0 lb

## 2014-03-22 DIAGNOSIS — IMO0001 Reserved for inherently not codable concepts without codable children: Secondary | ICD-10-CM | POA: Diagnosis present

## 2014-03-22 DIAGNOSIS — M519 Unspecified thoracic, thoracolumbar and lumbosacral intervertebral disc disorder: Secondary | ICD-10-CM | POA: Diagnosis present

## 2014-03-22 DIAGNOSIS — M47812 Spondylosis without myelopathy or radiculopathy, cervical region: Secondary | ICD-10-CM | POA: Insufficient documentation

## 2014-03-22 DIAGNOSIS — I498 Other specified cardiac arrhythmias: Secondary | ICD-10-CM | POA: Diagnosis present

## 2014-03-22 DIAGNOSIS — Z79899 Other long term (current) drug therapy: Secondary | ICD-10-CM | POA: Diagnosis present

## 2014-03-22 DIAGNOSIS — M797 Fibromyalgia: Secondary | ICD-10-CM

## 2014-03-22 DIAGNOSIS — Z5181 Encounter for therapeutic drug level monitoring: Secondary | ICD-10-CM | POA: Insufficient documentation

## 2014-03-22 MED ORDER — HYDROCODONE-ACETAMINOPHEN 10-325 MG PO TABS: 2.0000 | ORAL_TABLET | Freq: Three times a day (TID) | ORAL | Status: DC | PRN

## 2014-03-22 NOTE — Progress Notes (Signed)
Subjective:    Patient ID: [name], female    DOB: August 26, 1975, 38 y.o.   MRN: [id number]  HPI: [name] is a 38 year old female who returns for follow up for chronic pain and medication refill. She says her pain is located in her right arm. Also states she has a headache today it started this morning. She believes it's a sinus headache. She rates her pain 5. Her current exercise is walking. She will resume sitting with her great-great grandmother three times a week, since she is out of the hospital. Emotional today and crying, emotional support given.  Pain Inventory Average Pain 7 Pain Right Now 5 My pain is intermittent, sharp, burning, dull, stabbing, tingling and aching  In the last 24 hours, has pain interfered with the following? General activity 7 Relation with others 7 Enjoyment of life 8 What TIME of day is your pain at its worst? morning, evening, night Sleep (in general) Fair  Pain is worse with: walking, bending, sitting, inactivity, standing and some activites Pain improves with: rest, heat/ice, medication and TENS Relief from Meds: 5  Mobility walk without assistance how many minutes can you walk? 10-15 ability to climb steps?  yes do you drive?  yes transfers alone  Function disabled: date disabled na  Neuro/Psych No problems in this area  Prior Studies Any changes since last visit?  no  Physicians involved in your care Any changes since last visit?  no   Family History  Problem Relation Age of Onset  . Diabetes type II Mother   . Colon polyps Mother   . Diverticulitis Mother   . Diabetes Mother   . Heart disease Mother   . Diabetes type II Brother   . Uterine cancer    . Ovarian cancer     History   Social History  . Marital Status: Married    Spouse Name: N/A    Number of Children: 2  . Years of Education: N/A   Occupational History  . Disability    Social History Main Topics  . Smoking status: Former Smoker   Types: Cigarettes    Quit date: 06/30/2011  . Smokeless tobacco: Never Used  . Alcohol Use: No  . Drug Use: No  . Sexual Activity: Yes   Other Topics Concern  . None   Social History Narrative  . None   Past Surgical History  Procedure Laterality Date  . Cesarean section    . Partial hysterectomy    . Tonsillectomy    . Left elbow surgery     Past Medical History  Diagnosis Date  . Fibromyalgia   . Anxiety   . Depression   . Degenerative disc disease     Spinal, small syrinx  . Chronic pain     Dr. Letta Pate  . GERD (gastroesophageal reflux disease)   . Esophageal stricture     Status post dilatation  . Syncope     Negative tilt table test  . Palpitations     No documented arrhythmias  . Hiatal hernia   . Internal hemorrhoids without mention of complication   . Personal history of colonic polyps 10/04/2000    HYPERPLASTIC POLYP  . Irritable bowel syndrome   . POTS (postural orthostatic tachycardia syndrome)    BP 108/76  Pulse 114  Resp 14  Ht 5' 6"  (1.676 m)  Wt 120 lb (54.432 kg)  BMI 19.38 kg/m2  SpO2 99%  Opioid Risk Score:  Fall Risk Score: Moderate Fall Risk (6-13 points) (pt educated, handout declined)    Review of Systems  Musculoskeletal: Positive for back pain.  All other systems reviewed and are negative.      Objective:   Physical Exam  Nursing note and vitals reviewed. Constitutional: She is oriented to person, place, and time. She appears well-developed and well-nourished.  HENT:  Head: Normocephalic and atraumatic.  Neck: Normal range of motion. Neck supple.  Cardiovascular: Normal rate and regular rhythm.   Pulmonary/Chest: Effort normal and breath sounds normal.  Musculoskeletal:  Normal Muscle Bulk and Muscle Testing Reveals: Upper Extremities: Full ROM and Muscle strength 5/5 Back without spinal or paraspinal tenderness Lower extremities; Full ROM and Muscle strength 5/5 Arises from chair with ease Narrow based gait    Neurological: She is alert and oriented to person, place, and time.  Skin: Skin is warm and dry.  Psychiatric: She has a normal mood and affect.          Assessment & Plan:  1. 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  3. Fibromyalgia: Continue with Heat and exercise therapy.  4.Depression and anxiety: Psychiatry Following  5. Orthostatic hypotension: .Cardiology Following   20 minutes of face to face patient care time was spent during this visit. All questions were encouraged and answered.   F/U in 1 month

## 2014-04-18 ENCOUNTER — Encounter: Payer: Self-pay | Admitting: Registered Nurse

## 2014-04-18 ENCOUNTER — Encounter: Payer: Medicare Other | Attending: Physical Medicine & Rehabilitation | Admitting: Registered Nurse

## 2014-04-18 VITALS — BP 108/65 | HR 108 | Resp 14 | Ht 66.0 in | Wt 124.0 lb

## 2014-04-18 DIAGNOSIS — M47812 Spondylosis without myelopathy or radiculopathy, cervical region: Secondary | ICD-10-CM

## 2014-04-18 DIAGNOSIS — Z5181 Encounter for therapeutic drug level monitoring: Secondary | ICD-10-CM | POA: Insufficient documentation

## 2014-04-18 DIAGNOSIS — M797 Fibromyalgia: Secondary | ICD-10-CM

## 2014-04-18 DIAGNOSIS — M519 Unspecified thoracic, thoracolumbar and lumbosacral intervertebral disc disorder: Secondary | ICD-10-CM | POA: Diagnosis present

## 2014-04-18 DIAGNOSIS — I498 Other specified cardiac arrhythmias: Secondary | ICD-10-CM

## 2014-04-18 DIAGNOSIS — Z79899 Other long term (current) drug therapy: Secondary | ICD-10-CM | POA: Insufficient documentation

## 2014-04-18 DIAGNOSIS — IMO0001 Reserved for inherently not codable concepts without codable children: Secondary | ICD-10-CM | POA: Insufficient documentation

## 2014-04-18 MED ORDER — HYDROCODONE-ACETAMINOPHEN 10-325 MG PO TABS: 2.0000 | ORAL_TABLET | Freq: Three times a day (TID) | ORAL | Status: DC | PRN

## 2014-04-18 NOTE — Progress Notes (Signed)
Subjective:    Patient ID: [name], female    DOB: 06/28/1976, 38 y.o.   MRN: [id number]  HPI: [name] is a 38 year old female who returns for follow up for chronic pain and medication refill. She says her pain is located in her right arm.She rates her pain 7. Her current exercise regime is walking. She still sitting with her great-great grandmother three times a week, since she is out of the hospital.  Pain Inventory Average Pain 6 Pain Right Now 7 My pain is intermittent, sharp and burning  In the last 24 hours, has pain interfered with the following? General activity 9 Relation with others 7 Enjoyment of life 10 What TIME of day is your pain at its worst? evening, morning Sleep (in general) Good  Pain is worse with: walking, bending, sitting, standing and some activites Pain improves with: rest, heat/ice, medication, TENS and lying down Relief from Meds: 7  Mobility walk without assistance  Function disabled: date disabled 07/2009  Neuro/Psych weakness tremor spasms dizziness confusion depression anxiety  Prior Studies Any changes since last visit?  no  Physicians involved in your care Any changes since last visit?  no   Family History  Problem Relation Age of Onset  . Diabetes type II Mother   . Colon polyps Mother   . Diverticulitis Mother   . Diabetes Mother   . Heart disease Mother   . Diabetes type II Brother   . Uterine cancer    . Ovarian cancer     History   Social History  . Marital Status: Married    Spouse Name: N/A    Number of Children: 2  . Years of Education: N/A   Occupational History  . Disability    Social History Main Topics  . Smoking status: Former Smoker    Types: Cigarettes    Quit date: 06/30/2011  . Smokeless tobacco: Never Used  . Alcohol Use: No  . Drug Use: No  . Sexual Activity: Yes   Other Topics Concern  . None   Social History Narrative  . None   Past Surgical History  Procedure  Laterality Date  . Cesarean section    . Partial hysterectomy    . Tonsillectomy    . Left elbow surgery     Past Medical History  Diagnosis Date  . Fibromyalgia   . Anxiety   . Depression   . Degenerative disc disease     Spinal, small syrinx  . Chronic pain     Dr. Letta Pate  . GERD (gastroesophageal reflux disease)   . Esophageal stricture     Status post dilatation  . Syncope     Negative tilt table test  . Palpitations     No documented arrhythmias  . Hiatal hernia   . Internal hemorrhoids without mention of complication   . Personal history of colonic polyps 10/04/2000    HYPERPLASTIC POLYP  . Irritable bowel syndrome   . POTS (postural orthostatic tachycardia syndrome)    BP 108/65  Pulse 108  Resp 14  Ht 5' 6"  (1.676 m)  Wt 124 lb (56.246 kg)  BMI 20.02 kg/m2  SpO2 100%  Opioid Risk Score:   Fall Risk Score:     Review of Systems     Objective:   Physical Exam  Nursing note and vitals reviewed. Constitutional: She is oriented to person, place, and time. She appears well-developed and well-nourished.  HENT:  Head: Normocephalic  and atraumatic.  Neck: Normal range of motion. Neck supple.  Cardiovascular: Normal rate and regular rhythm.   Pulmonary/Chest: Effort normal and breath sounds normal.  Musculoskeletal:  Normal Muscle Bulk and Muscle Testing Reveals: Upper Extremities: Full ROM and Muscle Strength 5/5 Back without spinal or paraspinal tenderness Lower Extremities: Full ROM and Muscle Strength 5/5 Arises from chair with ease Narrow based Gait  Neurological: She is alert and oriented to person, place, and time.  Skin: Skin is warm and dry.  Psychiatric: She has a normal mood and affect.          Assessment & Plan:  1. 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  3.  Fibromyalgia: Continue with Heat and exercise therapy.  4.Depression and anxiety: Psychiatry Following  5. Orthostatic hypotension: .Cardiology Following   20 minutes of face to face patient care time was spent during this visit. All questions were encouraged and answered.   F/U in 1 month

## 2014-05-17 ENCOUNTER — Encounter: Payer: Medicare Other | Admitting: Registered Nurse

## 2014-05-21 ENCOUNTER — Encounter: Payer: Medicare Other | Attending: Physical Medicine & Rehabilitation | Admitting: Registered Nurse

## 2014-05-21 ENCOUNTER — Encounter: Payer: Self-pay | Admitting: Registered Nurse

## 2014-05-21 VITALS — BP 100/68 | HR 95 | Resp 14 | Ht 67.0 in | Wt 124.0 lb

## 2014-05-21 DIAGNOSIS — K589 Irritable bowel syndrome without diarrhea: Secondary | ICD-10-CM | POA: Insufficient documentation

## 2014-05-21 DIAGNOSIS — R55 Syncope and collapse: Secondary | ICD-10-CM | POA: Diagnosis not present

## 2014-05-21 DIAGNOSIS — F419 Anxiety disorder, unspecified: Secondary | ICD-10-CM | POA: Diagnosis not present

## 2014-05-21 DIAGNOSIS — M542 Cervicalgia: Secondary | ICD-10-CM | POA: Diagnosis not present

## 2014-05-21 DIAGNOSIS — M47812 Spondylosis without myelopathy or radiculopathy, cervical region: Secondary | ICD-10-CM

## 2014-05-21 DIAGNOSIS — K648 Other hemorrhoids: Secondary | ICD-10-CM | POA: Diagnosis not present

## 2014-05-21 DIAGNOSIS — K219 Gastro-esophageal reflux disease without esophagitis: Secondary | ICD-10-CM | POA: Diagnosis not present

## 2014-05-21 DIAGNOSIS — M545 Low back pain: Secondary | ICD-10-CM | POA: Insufficient documentation

## 2014-05-21 DIAGNOSIS — Z87891 Personal history of nicotine dependence: Secondary | ICD-10-CM | POA: Diagnosis not present

## 2014-05-21 DIAGNOSIS — I951 Orthostatic hypotension: Secondary | ICD-10-CM

## 2014-05-21 DIAGNOSIS — M5136 Other intervertebral disc degeneration, lumbar region: Secondary | ICD-10-CM | POA: Diagnosis not present

## 2014-05-21 DIAGNOSIS — M797 Fibromyalgia: Secondary | ICD-10-CM

## 2014-05-21 DIAGNOSIS — F329 Major depressive disorder, single episode, unspecified: Secondary | ICD-10-CM | POA: Diagnosis not present

## 2014-05-21 DIAGNOSIS — M47892 Other spondylosis, cervical region: Secondary | ICD-10-CM | POA: Diagnosis not present

## 2014-05-21 DIAGNOSIS — K088 Other specified disorders of teeth and supporting structures: Secondary | ICD-10-CM | POA: Insufficient documentation

## 2014-05-21 DIAGNOSIS — M4647 Discitis, unspecified, lumbosacral region: Secondary | ICD-10-CM

## 2014-05-21 DIAGNOSIS — R51 Headache: Secondary | ICD-10-CM | POA: Diagnosis not present

## 2014-05-21 DIAGNOSIS — G8929 Other chronic pain: Secondary | ICD-10-CM | POA: Diagnosis not present

## 2014-05-21 DIAGNOSIS — R000 Tachycardia, unspecified: Secondary | ICD-10-CM | POA: Diagnosis not present

## 2014-05-21 DIAGNOSIS — Z5181 Encounter for therapeutic drug level monitoring: Secondary | ICD-10-CM

## 2014-05-21 DIAGNOSIS — G90A Postural orthostatic tachycardia syndrome (POTS): Secondary | ICD-10-CM

## 2014-05-21 DIAGNOSIS — Z760 Encounter for issue of repeat prescription: Secondary | ICD-10-CM | POA: Diagnosis present

## 2014-05-21 DIAGNOSIS — Z86010 Personal history of colonic polyps: Secondary | ICD-10-CM | POA: Diagnosis not present

## 2014-05-21 DIAGNOSIS — K449 Diaphragmatic hernia without obstruction or gangrene: Secondary | ICD-10-CM | POA: Diagnosis not present

## 2014-05-21 DIAGNOSIS — Z599 Problem related to housing and economic circumstances, unspecified: Secondary | ICD-10-CM | POA: Diagnosis not present

## 2014-05-21 DIAGNOSIS — K222 Esophageal obstruction: Secondary | ICD-10-CM | POA: Diagnosis not present

## 2014-05-21 DIAGNOSIS — R002 Palpitations: Secondary | ICD-10-CM | POA: Insufficient documentation

## 2014-05-21 DIAGNOSIS — M519 Unspecified thoracic, thoracolumbar and lumbosacral intervertebral disc disorder: Secondary | ICD-10-CM

## 2014-05-21 DIAGNOSIS — Z79899 Other long term (current) drug therapy: Secondary | ICD-10-CM

## 2014-05-21 MED ORDER — HYDROCODONE-ACETAMINOPHEN 10-325 MG PO TABS: 2.0000 | ORAL_TABLET | Freq: Three times a day (TID) | ORAL | Status: DC | PRN

## 2014-05-21 NOTE — Patient Instructions (Signed)
Hypothyroidism The thyroid is a large gland located in the lower front of your neck. The thyroid gland helps control metabolism. Metabolism is how your body handles food. It controls metabolism with the hormone thyroxine. When this gland is underactive (hypothyroid), it produces too little hormone.  CAUSES These include:   Absence or destruction of thyroid tissue.  Goiter due to iodine deficiency.  Goiter due to medications.  Congenital defects (since birth).  Problems with the pituitary. This causes a lack of TSH (thyroid stimulating hormone). This hormone tells the thyroid to turn out more hormone. SYMPTOMS  Lethargy (feeling as though you have no energy)  Cold intolerance  Weight gain (in spite of normal food intake)  Dry skin  Coarse hair  Menstrual irregularity (if severe, may lead to infertility)  Slowing of thought processes Cardiac problems are also caused by insufficient amounts of thyroid hormone. Hypothyroidism in the newborn is cretinism, and is an extreme form. It is important that this form be treated adequately and immediately or it will lead rapidly to retarded physical and mental development. DIAGNOSIS  To prove hypothyroidism, your caregiver may do blood tests and ultrasound tests. Sometimes the signs are hidden. It may be necessary for your caregiver to watch this illness with blood tests either before or after diagnosis and treatment. TREATMENT  Low levels of thyroid hormone are increased by using synthetic thyroid hormone. This is a safe, effective treatment. It usually takes about four weeks to gain the full effects of the medication. After you have the full effect of the medication, it will generally take another four weeks for problems to leave. Your caregiver may start you on low doses. If you have had heart problems the dose may be gradually increased. It is generally not an emergency to get rapidly to normal. HOME CARE INSTRUCTIONS   Take your  medications as your caregiver suggests. Let your caregiver know of any medications you are taking or start taking. Your caregiver will help you with dosage schedules.  As your condition improves, your dosage needs may increase. It will be necessary to have continuing blood tests as suggested by your caregiver.  Report all suspected medication side effects to your caregiver. SEEK MEDICAL CARE IF: Seek medical care if you develop:  Sweating.  Tremulousness (tremors).  Anxiety.  Rapid weight loss.  Heat intolerance.  Emotional swings.  Diarrhea.  Weakness. SEEK IMMEDIATE MEDICAL CARE IF:  You develop chest pain, an irregular heart beat (palpitations), or a rapid heart beat. MAKE SURE YOU:   Understand these instructions.  Will watch your condition.  Will get help right away if you are not doing well or get worse. Document Released: 07/20/2005 Document Revised: 10/12/2011 Document Reviewed: 03/09/2008 [facility] Patient Information 2015 [place], [place]. This information is not intended to replace advice given to you by your health care provider. Make sure you discuss any questions you have with your health care provider.

## 2014-05-21 NOTE — Progress Notes (Signed)
Subjective:    Patient ID: [name], female    DOB: [birth year], 38 y.o.   MRN: [id number]  HPI: [name] is a 38 year old female who returns for follow up for chronic pain and medication refill. She's having a headache and toothache today,also says her pain is located in her neck and lower back. She rates her pain 8. Her current exercise regime is walking, performing stretching exercises and leg lifts. She had death in the family emotional support given. She says her thyroid level was low <1, her psychiatrist started her on Cytomel 25 mcg daily she was having mood swings and more depressed. She has noticed an improvement in her emotional state.  She admits to financial hardships, she wanted to come every 2 months. Explain office protocol and she verbalizes understand. Very tearful emotional support given. Encouraged to re-apply for Food-stamps, and a list of food pantries were given to her.   Pain Inventory Average Pain 7 Pain Right Now 8 My pain is constant, sharp, burning, dull, stabbing, tingling and aching  In the last 24 hours, has pain interfered with the following? General activity 5 Relation with others 5 Enjoyment of life 5 What TIME of day is your pain at its worst? morning and evening Sleep (in general) Fair  Pain is worse with: walking, bending, sitting, inactivity, standing and unsure Pain improves with: rest, heat/ice, therapy/exercise, pacing activities, medication and TENS Relief from Meds: 6  Mobility Walks without assistance Walks 10-15 minutes at a time Ability to climb steps? Yes Do you drive? yes  Function disabled: date disabled .  Neuro/Psych weakness numbness tremor tingling trouble walking spasms dizziness confusion depression anxiety  Prior Studies Any changes since last visit?  no  Physicians involved in your care Any changes since last visit?  no   Family History  Problem Relation Age of Onset  . Diabetes type  II Mother   . Colon polyps Mother   . Diverticulitis Mother   . Diabetes Mother   . Heart disease Mother   . Diabetes type II Brother   . Uterine cancer    . Ovarian cancer     History   Social History  . Marital Status: Married    Spouse Name: N/A    Number of Children: 2  . Years of Education: N/A   Occupational History  . Disability    Social History Main Topics  . Smoking status: Former Smoker    Types: Cigarettes    Quit date: 06/30/2011  . Smokeless tobacco: Never Used  . Alcohol Use: No  . Drug Use: No  . Sexual Activity: Yes   Other Topics Concern  . None   Social History Narrative  . None   Past Surgical History  Procedure Laterality Date  . Cesarean section    . Partial hysterectomy    . Tonsillectomy    . Left elbow surgery     Past Medical History  Diagnosis Date  . Fibromyalgia   . Anxiety   . Depression   . Degenerative disc disease     Spinal, small syrinx  . Chronic pain     Dr. Letta Pate  . GERD (gastroesophageal reflux disease)   . Esophageal stricture     Status post dilatation  . Syncope     Negative tilt table test  . Palpitations     No documented arrhythmias  . Hiatal hernia   . Internal hemorrhoids without mention of complication   .  Personal history of colonic polyps 10/04/2000    HYPERPLASTIC POLYP  . Irritable bowel syndrome   . POTS (postural orthostatic tachycardia syndrome)    BP 100/68  Pulse 95  Resp 14  Ht 5' 7"  (1.702 m)  Wt 124 lb (56.246 kg)  BMI 19.42 kg/m2  SpO2 99%  Opioid Risk Score:   Fall Risk Score: Low Fall Risk (0-5 points)   Review of Systems     Objective:   Physical Exam  Nursing note and vitals reviewed. Constitutional: She is oriented to person, place, and time. She appears well-developed and well-nourished.  HENT:  Head: Normocephalic and atraumatic.  Neck: Normal range of motion. Neck supple.  Cardiovascular: Normal rate and regular rhythm.   Pulmonary/Chest: Effort normal and  breath sounds normal.  Musculoskeletal:  Normal Muscle Bulk and Muscle testing Reveals: Upper Extremities: Full ROM and Muscle Strength 5/5 Back without spinal or Paraspinal Tenderness Lower Extremities: Full ROM and Muscle Strength 5/5 Arises from chair with ease Narrow based gait  Neurological: She is alert and oriented to person, place, and time.  Skin: Skin is warm and dry.  Psychiatric: She has a normal mood and affect.          Assessment & Plan:  1. 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  3. Fibromyalgia: Continue with Heat and exercise therapy.  4.Depression and anxiety: Psychiatry Following  5. Orthostatic hypotension: .Cardiology Following  20 minutes of face to face patient care time was spent during this visit. All questions were encouraged and answered.

## 2014-06-18 ENCOUNTER — Encounter: Payer: Medicare Other | Admitting: Registered Nurse

## 2014-06-18 ENCOUNTER — Encounter: Payer: Medicare Other | Attending: Physical Medicine & Rehabilitation | Admitting: Registered Nurse

## 2014-06-18 ENCOUNTER — Encounter: Payer: Self-pay | Admitting: Registered Nurse

## 2014-06-18 VITALS — BP 144/78 | HR 88 | Resp 16 | Ht 64.0 in | Wt 126.8 lb

## 2014-06-18 DIAGNOSIS — M797 Fibromyalgia: Secondary | ICD-10-CM | POA: Diagnosis not present

## 2014-06-18 DIAGNOSIS — M47892 Other spondylosis, cervical region: Secondary | ICD-10-CM | POA: Insufficient documentation

## 2014-06-18 DIAGNOSIS — K589 Irritable bowel syndrome without diarrhea: Secondary | ICD-10-CM | POA: Diagnosis not present

## 2014-06-18 DIAGNOSIS — I951 Orthostatic hypotension: Secondary | ICD-10-CM | POA: Insufficient documentation

## 2014-06-18 DIAGNOSIS — F329 Major depressive disorder, single episode, unspecified: Secondary | ICD-10-CM | POA: Diagnosis not present

## 2014-06-18 DIAGNOSIS — R51 Headache: Secondary | ICD-10-CM | POA: Insufficient documentation

## 2014-06-18 DIAGNOSIS — K648 Other hemorrhoids: Secondary | ICD-10-CM | POA: Insufficient documentation

## 2014-06-18 DIAGNOSIS — Z87891 Personal history of nicotine dependence: Secondary | ICD-10-CM | POA: Insufficient documentation

## 2014-06-18 DIAGNOSIS — R55 Syncope and collapse: Secondary | ICD-10-CM | POA: Diagnosis not present

## 2014-06-18 DIAGNOSIS — Z599 Problem related to housing and economic circumstances, unspecified: Secondary | ICD-10-CM | POA: Insufficient documentation

## 2014-06-18 DIAGNOSIS — F419 Anxiety disorder, unspecified: Secondary | ICD-10-CM | POA: Insufficient documentation

## 2014-06-18 DIAGNOSIS — G609 Hereditary and idiopathic neuropathy, unspecified: Secondary | ICD-10-CM

## 2014-06-18 DIAGNOSIS — M545 Low back pain: Secondary | ICD-10-CM | POA: Diagnosis not present

## 2014-06-18 DIAGNOSIS — K219 Gastro-esophageal reflux disease without esophagitis: Secondary | ICD-10-CM | POA: Diagnosis not present

## 2014-06-18 DIAGNOSIS — R002 Palpitations: Secondary | ICD-10-CM | POA: Diagnosis not present

## 2014-06-18 DIAGNOSIS — R000 Tachycardia, unspecified: Secondary | ICD-10-CM | POA: Insufficient documentation

## 2014-06-18 DIAGNOSIS — K222 Esophageal obstruction: Secondary | ICD-10-CM | POA: Diagnosis not present

## 2014-06-18 DIAGNOSIS — Z760 Encounter for issue of repeat prescription: Secondary | ICD-10-CM | POA: Insufficient documentation

## 2014-06-18 DIAGNOSIS — G8929 Other chronic pain: Secondary | ICD-10-CM | POA: Insufficient documentation

## 2014-06-18 DIAGNOSIS — M5136 Other intervertebral disc degeneration, lumbar region: Secondary | ICD-10-CM | POA: Diagnosis not present

## 2014-06-18 DIAGNOSIS — M542 Cervicalgia: Secondary | ICD-10-CM | POA: Diagnosis not present

## 2014-06-18 DIAGNOSIS — Z79899 Other long term (current) drug therapy: Secondary | ICD-10-CM

## 2014-06-18 DIAGNOSIS — K088 Other specified disorders of teeth and supporting structures: Secondary | ICD-10-CM | POA: Insufficient documentation

## 2014-06-18 DIAGNOSIS — M47812 Spondylosis without myelopathy or radiculopathy, cervical region: Secondary | ICD-10-CM

## 2014-06-18 DIAGNOSIS — M519 Unspecified thoracic, thoracolumbar and lumbosacral intervertebral disc disorder: Secondary | ICD-10-CM

## 2014-06-18 DIAGNOSIS — Z86010 Personal history of colonic polyps: Secondary | ICD-10-CM | POA: Insufficient documentation

## 2014-06-18 DIAGNOSIS — K449 Diaphragmatic hernia without obstruction or gangrene: Secondary | ICD-10-CM | POA: Diagnosis not present

## 2014-06-18 DIAGNOSIS — G90A Postural orthostatic tachycardia syndrome (POTS): Secondary | ICD-10-CM

## 2014-06-18 DIAGNOSIS — Z5181 Encounter for therapeutic drug level monitoring: Secondary | ICD-10-CM

## 2014-06-18 DIAGNOSIS — M4647 Discitis, unspecified, lumbosacral region: Secondary | ICD-10-CM

## 2014-06-18 MED ORDER — HYDROCODONE-ACETAMINOPHEN 10-325 MG PO TABS: 2.0000 | ORAL_TABLET | Freq: Three times a day (TID) | ORAL | Status: DC | PRN

## 2014-06-18 MED ORDER — GABAPENTIN 100 MG PO CAPS: 100.0000 mg | ORAL_CAPSULE | Freq: Three times a day (TID) | ORAL | Status: DC
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
## 2014-06-18 NOTE — Patient Instructions (Signed)
Start With Gabapentin  One Capsule at Bedtime for one week  If Pain Persists  Take one Capsule twice a Day for week   If Pain Persists Take one Capsule three times a day

## 2014-07-17 ENCOUNTER — Encounter: Payer: Medicare Other | Attending: Physical Medicine & Rehabilitation | Admitting: Registered Nurse

## 2014-07-17 ENCOUNTER — Encounter: Payer: Self-pay | Admitting: Registered Nurse

## 2014-07-17 ENCOUNTER — Other Ambulatory Visit: Payer: Self-pay | Admitting: Physical Medicine & Rehabilitation

## 2014-07-17 VITALS — BP 98/65 | HR 102 | Resp 14 | Ht 66.0 in | Wt 119.0 lb

## 2014-07-17 DIAGNOSIS — K219 Gastro-esophageal reflux disease without esophagitis: Secondary | ICD-10-CM | POA: Diagnosis not present

## 2014-07-17 DIAGNOSIS — F329 Major depressive disorder, single episode, unspecified: Secondary | ICD-10-CM | POA: Diagnosis not present

## 2014-07-17 DIAGNOSIS — R55 Syncope and collapse: Secondary | ICD-10-CM | POA: Diagnosis not present

## 2014-07-17 DIAGNOSIS — Z86010 Personal history of colonic polyps: Secondary | ICD-10-CM | POA: Insufficient documentation

## 2014-07-17 DIAGNOSIS — M542 Cervicalgia: Secondary | ICD-10-CM | POA: Insufficient documentation

## 2014-07-17 DIAGNOSIS — K589 Irritable bowel syndrome without diarrhea: Secondary | ICD-10-CM | POA: Insufficient documentation

## 2014-07-17 DIAGNOSIS — M797 Fibromyalgia: Secondary | ICD-10-CM | POA: Diagnosis not present

## 2014-07-17 DIAGNOSIS — K222 Esophageal obstruction: Secondary | ICD-10-CM | POA: Insufficient documentation

## 2014-07-17 DIAGNOSIS — M5136 Other intervertebral disc degeneration, lumbar region: Secondary | ICD-10-CM | POA: Diagnosis not present

## 2014-07-17 DIAGNOSIS — F419 Anxiety disorder, unspecified: Secondary | ICD-10-CM | POA: Diagnosis not present

## 2014-07-17 DIAGNOSIS — R51 Headache: Secondary | ICD-10-CM | POA: Diagnosis not present

## 2014-07-17 DIAGNOSIS — R002 Palpitations: Secondary | ICD-10-CM | POA: Diagnosis not present

## 2014-07-17 DIAGNOSIS — K088 Other specified disorders of teeth and supporting structures: Secondary | ICD-10-CM | POA: Diagnosis not present

## 2014-07-17 DIAGNOSIS — G90A Postural orthostatic tachycardia syndrome (POTS): Secondary | ICD-10-CM

## 2014-07-17 DIAGNOSIS — M47812 Spondylosis without myelopathy or radiculopathy, cervical region: Secondary | ICD-10-CM

## 2014-07-17 DIAGNOSIS — K648 Other hemorrhoids: Secondary | ICD-10-CM | POA: Diagnosis not present

## 2014-07-17 DIAGNOSIS — K449 Diaphragmatic hernia without obstruction or gangrene: Secondary | ICD-10-CM | POA: Diagnosis not present

## 2014-07-17 DIAGNOSIS — Z87891 Personal history of nicotine dependence: Secondary | ICD-10-CM | POA: Insufficient documentation

## 2014-07-17 DIAGNOSIS — R000 Tachycardia, unspecified: Secondary | ICD-10-CM | POA: Diagnosis not present

## 2014-07-17 DIAGNOSIS — M545 Low back pain: Secondary | ICD-10-CM | POA: Insufficient documentation

## 2014-07-17 DIAGNOSIS — M47892 Other spondylosis, cervical region: Secondary | ICD-10-CM | POA: Insufficient documentation

## 2014-07-17 DIAGNOSIS — Z599 Problem related to housing and economic circumstances, unspecified: Secondary | ICD-10-CM | POA: Insufficient documentation

## 2014-07-17 DIAGNOSIS — G8929 Other chronic pain: Secondary | ICD-10-CM | POA: Diagnosis not present

## 2014-07-17 DIAGNOSIS — Z760 Encounter for issue of repeat prescription: Secondary | ICD-10-CM | POA: Insufficient documentation

## 2014-07-17 DIAGNOSIS — Z79899 Other long term (current) drug therapy: Secondary | ICD-10-CM

## 2014-07-17 DIAGNOSIS — G894 Chronic pain syndrome: Secondary | ICD-10-CM

## 2014-07-17 DIAGNOSIS — I951 Orthostatic hypotension: Secondary | ICD-10-CM | POA: Diagnosis not present

## 2014-07-17 DIAGNOSIS — Z5181 Encounter for therapeutic drug level monitoring: Secondary | ICD-10-CM

## 2014-07-17 MED ORDER — HYDROCODONE-ACETAMINOPHEN 10-325 MG PO TABS: 2.0000 | ORAL_TABLET | Freq: Three times a day (TID) | ORAL | Status: DC | PRN

## 2014-07-17 NOTE — Progress Notes (Signed)
Subjective:    Patient ID: [name], female    DOB: Jul 12, 1976, 38 y.o.   MRN: [id number]  HPI: [name] is a 38 year old female who returns for follow up for chronic pain and medication refill. She says her pain is located in her lower back and complaining of bone pain. She rates her pain 5. Her current exercise regime is walking and performing stretching exercises. Arrived to office tachycardic 102 apical pulse checked 88. Her step-mom passed away she was very tearful emotional support given.  Pain Inventory Average Pain 7 Pain Right Now 5 My pain is constant, sharp, burning, dull, stabbing, tingling and aching  In the last 24 hours, has pain interfered with the following? General activity 8 Relation with others 9 Enjoyment of life 9 What TIME of day is your pain at its worst? morning, evening, night Sleep (in general) Fair  Pain is worse with: walking, bending, sitting, inactivity, standing and unsure Pain improves with: rest, heat/ice, therapy/exercise, medication and TENS Relief from Meds: 6  Mobility walk without assistance how many minutes can you walk? 10-26 ability to climb steps?  yes do you drive?  yes Do you have any goals in this area?  yes  Function disabled: date disabled . I need assistance with the following:  meal prep, household duties and shopping Do you have any goals in this area?  yes  Neuro/Psych weakness numbness tremor tingling trouble walking spasms dizziness confusion depression anxiety  Prior Studies Any changes since last visit?  no  Physicians involved in your care Any changes since last visit?  no   Family History  Problem Relation Age of Onset  . Diabetes type II Mother   . Colon polyps Mother   . Diverticulitis Mother   . Diabetes Mother   . Heart disease Mother   . Diabetes type II Brother   . Uterine cancer    . Ovarian cancer     History   Social History  . Marital Status: Married    Spouse  Name: N/A    Number of Children: 2  . Years of Education: N/A   Occupational History  . Disability    Social History Main Topics  . Smoking status: Former Smoker    Types: Cigarettes    Quit date: 06/30/2011  . Smokeless tobacco: Never Used  . Alcohol Use: No  . Drug Use: No  . Sexual Activity: Yes   Other Topics Concern  . None   Social History Narrative   Past Surgical History  Procedure Laterality Date  . Cesarean section    . Partial hysterectomy    . Tonsillectomy    . Left elbow surgery     Past Medical History  Diagnosis Date  . Fibromyalgia   . Anxiety   . Depression   . Degenerative disc disease     Spinal, small syrinx  . Chronic pain     Dr. Letta Pate  . GERD (gastroesophageal reflux disease)   . Esophageal stricture     Status post dilatation  . Syncope     Negative tilt table test  . Palpitations     No documented arrhythmias  . Hiatal hernia   . Internal hemorrhoids without mention of complication   . Personal history of colonic polyps 10/04/2000    HYPERPLASTIC POLYP  . Irritable bowel syndrome   . POTS (postural orthostatic tachycardia syndrome)    BP 98/65 mmHg  Pulse 102  Resp 14  Ht 5' 6"  (1.676 m)  Wt 119 lb (53.978 kg)  BMI 19.22 kg/m2  SpO2 99%  Opioid Risk Score:   Fall Risk Score: Low Fall Risk (0-5 points) (pt given pamphlet at todays visit) Review of Systems  Constitutional:       Night sweats  Cardiovascular: Positive for leg swelling.  Neurological: Positive for dizziness, tremors, weakness and numbness.       Tingling Spasms   Psychiatric/Behavioral: Positive for confusion and dysphoric mood. The patient is nervous/anxious.   All other systems reviewed and are negative.      Objective:   Physical Exam  Constitutional: She is oriented to person, place, and time. She appears well-developed and well-nourished.  HENT:  Head: Normocephalic and atraumatic.  Neck: Normal range of motion. Neck supple.  Cardiovascular:  Normal rate and regular rhythm.   Pulmonary/Chest: Effort normal and breath sounds normal.  Musculoskeletal:  Normal Muscle Bulk and Muscle Testing Reveals: Upper Extremities: Full ROM and Muscle strength 5/5 Back without spinal or Paraspinal Tenderness Lower Extremities: Full ROM and Muscle strength 5/5 Arises from chair with ease Narrow Based Gait  Neurological: She is alert and oriented to person, place, and time.  Skin: Skin is warm and dry.  Psychiatric: She has a normal mood and affect.  Nursing note and vitals reviewed.         Assessment & Plan:  1. 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  3. Fibromyalgia: Continue with Heat and exercise therapy.  4.Depression and anxiety: Psychiatry Following  5. Orthostatic hypotension: .Cardiology Following  6. Peripheral Neuropathy: RX: Gabapentin 100 mg HS, Instructions given.  20 minutes of face to face patient care time was spent during this visit. All questions were encouraged and answered.

## 2014-07-18 LAB — PMP ALCOHOL METABOLITE (ETG): ETGU: NEGATIVE ng/mL

## 2014-07-23 LAB — BENZODIAZEPINES (GC/LC/MS), URINE
Alprazolam metabolite (GC/LC/MS), ur confirm: NEGATIVE ng/mL (ref <25)
Clonazepam metabolite (GC/LC/MS), ur confirm: 5626 ng/mL (ref <25)
Flurazepam metabolite (GC/LC/MS), ur confirm: NEGATIVE ng/mL (ref <50)
Lorazepam (GC/LC/MS), ur confirm: NEGATIVE ng/mL (ref <50)
MIDAZOLAMU: NEGATIVE ng/mL (ref <50)
Nordiazepam (GC/LC/MS), ur confirm: NEGATIVE ng/mL (ref <50)
Oxazepam (GC/LC/MS), ur confirm: NEGATIVE ng/mL (ref <50)
TEMAZEPAMU: NEGATIVE ng/mL (ref <50)
TRIAZOLAMU: NEGATIVE ng/mL (ref <50)

## 2014-07-23 LAB — OPIATES/OPIOIDS (LC/MS-MS)
CODEINE URINE: NEGATIVE ng/mL (ref <50)
Hydrocodone: 962 ng/mL (ref <50)
Hydromorphone: 618 ng/mL (ref <50)
MORPHINE: NEGATIVE ng/mL (ref <50)
Norhydrocodone, Ur: 3058 ng/mL (ref <50)
Noroxycodone, Ur: NEGATIVE ng/mL (ref <50)
Oxycodone, ur: NEGATIVE ng/mL (ref <50)
Oxymorphone: NEGATIVE ng/mL (ref <50)

## 2014-07-23 LAB — AMPHETAMINES (GC/LC/MS), URINE
AMPHETAMINE CONF, UR: 32161 ng/mL (ref <250)
Methamphetamine Quant, Ur: NEGATIVE ng/mL (ref <250)

## 2014-07-24 LAB — PRESCRIPTION MONITORING PROFILE (SOLSTAS)
BARBITURATE SCREEN, URINE: NEGATIVE ng/mL
Buprenorphine, Urine: NEGATIVE ng/mL
CREATININE, URINE: 250.6 mg/dL (ref >20.0)
Cannabinoid Scrn, Ur: NEGATIVE ng/mL
Carisoprodol, Urine: NEGATIVE ng/mL
Cocaine Metabolites: NEGATIVE ng/mL
FENTANYL URINE: NEGATIVE ng/mL
MDMA URINE: NEGATIVE ng/mL
Meperidine, Ur: NEGATIVE ng/mL
Methadone Screen, Urine: NEGATIVE ng/mL
Nitrites, Initial: NEGATIVE ug/mL
Oxycodone Screen, Ur: NEGATIVE ng/mL
Propoxyphene: NEGATIVE ng/mL
TAPENTADOLUR: NEGATIVE ng/mL
Tramadol Scrn, Ur: NEGATIVE ng/mL
Zolpidem, Urine: NEGATIVE ng/mL
pH, Initial: 6.4 pH (ref 4.5–8.9)

## 2014-07-30 NOTE — Progress Notes (Signed)
Urine drug screen for this encounter was consistent for prescribed medications.

## 2014-08-14 ENCOUNTER — Encounter: Payer: Self-pay | Admitting: Registered Nurse

## 2014-08-14 ENCOUNTER — Encounter: Payer: Medicare Other | Attending: Physical Medicine & Rehabilitation | Admitting: Registered Nurse

## 2014-08-14 VITALS — BP 106/46 | HR 72 | Resp 20

## 2014-08-14 DIAGNOSIS — K222 Esophageal obstruction: Secondary | ICD-10-CM | POA: Diagnosis not present

## 2014-08-14 DIAGNOSIS — G8929 Other chronic pain: Secondary | ICD-10-CM | POA: Insufficient documentation

## 2014-08-14 DIAGNOSIS — M47812 Spondylosis without myelopathy or radiculopathy, cervical region: Secondary | ICD-10-CM

## 2014-08-14 DIAGNOSIS — M5136 Other intervertebral disc degeneration, lumbar region: Secondary | ICD-10-CM | POA: Diagnosis not present

## 2014-08-14 DIAGNOSIS — Z599 Problem related to housing and economic circumstances, unspecified: Secondary | ICD-10-CM | POA: Diagnosis not present

## 2014-08-14 DIAGNOSIS — I951 Orthostatic hypotension: Secondary | ICD-10-CM | POA: Insufficient documentation

## 2014-08-14 DIAGNOSIS — M47892 Other spondylosis, cervical region: Secondary | ICD-10-CM | POA: Diagnosis not present

## 2014-08-14 DIAGNOSIS — R002 Palpitations: Secondary | ICD-10-CM | POA: Insufficient documentation

## 2014-08-14 DIAGNOSIS — R51 Headache: Secondary | ICD-10-CM | POA: Insufficient documentation

## 2014-08-14 DIAGNOSIS — R55 Syncope and collapse: Secondary | ICD-10-CM | POA: Insufficient documentation

## 2014-08-14 DIAGNOSIS — R000 Tachycardia, unspecified: Secondary | ICD-10-CM | POA: Diagnosis not present

## 2014-08-14 DIAGNOSIS — M797 Fibromyalgia: Secondary | ICD-10-CM | POA: Insufficient documentation

## 2014-08-14 DIAGNOSIS — K449 Diaphragmatic hernia without obstruction or gangrene: Secondary | ICD-10-CM | POA: Diagnosis not present

## 2014-08-14 DIAGNOSIS — K088 Other specified disorders of teeth and supporting structures: Secondary | ICD-10-CM | POA: Insufficient documentation

## 2014-08-14 DIAGNOSIS — Z760 Encounter for issue of repeat prescription: Secondary | ICD-10-CM | POA: Diagnosis not present

## 2014-08-14 DIAGNOSIS — M542 Cervicalgia: Secondary | ICD-10-CM | POA: Insufficient documentation

## 2014-08-14 DIAGNOSIS — Z79899 Other long term (current) drug therapy: Secondary | ICD-10-CM

## 2014-08-14 DIAGNOSIS — F329 Major depressive disorder, single episode, unspecified: Secondary | ICD-10-CM | POA: Insufficient documentation

## 2014-08-14 DIAGNOSIS — Z86010 Personal history of colonic polyps: Secondary | ICD-10-CM | POA: Insufficient documentation

## 2014-08-14 DIAGNOSIS — K589 Irritable bowel syndrome without diarrhea: Secondary | ICD-10-CM | POA: Insufficient documentation

## 2014-08-14 DIAGNOSIS — Z87891 Personal history of nicotine dependence: Secondary | ICD-10-CM | POA: Diagnosis not present

## 2014-08-14 DIAGNOSIS — G90A Postural orthostatic tachycardia syndrome (POTS): Secondary | ICD-10-CM

## 2014-08-14 DIAGNOSIS — F419 Anxiety disorder, unspecified: Secondary | ICD-10-CM | POA: Diagnosis not present

## 2014-08-14 DIAGNOSIS — M545 Low back pain: Secondary | ICD-10-CM | POA: Diagnosis not present

## 2014-08-14 DIAGNOSIS — K219 Gastro-esophageal reflux disease without esophagitis: Secondary | ICD-10-CM | POA: Diagnosis not present

## 2014-08-14 DIAGNOSIS — G894 Chronic pain syndrome: Secondary | ICD-10-CM

## 2014-08-14 DIAGNOSIS — Z5181 Encounter for therapeutic drug level monitoring: Secondary | ICD-10-CM

## 2014-08-14 DIAGNOSIS — K648 Other hemorrhoids: Secondary | ICD-10-CM | POA: Diagnosis not present

## 2014-08-14 MED ORDER — HYDROCODONE-ACETAMINOPHEN 10-325 MG PO TABS: 2.0000 | ORAL_TABLET | Freq: Three times a day (TID) | ORAL | Status: DC | PRN

## 2014-08-14 NOTE — Patient Instructions (Signed)
Check the website:  [facility] card

## 2014-08-14 NOTE — Progress Notes (Signed)
Subjective:    Patient ID: [name], female    DOB: 04/13/1976, 39 y.o.   MRN: [id number]  HPI: [name] is a 39 year old female who returns for follow up for chronic pain and medication refill. She says her pain is located in her lower back. She rates her pain 6. Her current exercise regime is walking.  Pain Inventory Average Pain 7 Pain Right Now 6 My pain is sharp, burning, dull, stabbing, tingling and aching  In the last 24 hours, has pain interfered with the following? General activity 8 Relation with others 8 Enjoyment of life 8 What TIME of day is your pain at its worst? all Sleep (in general) Fair  Pain is worse with: walking, bending, sitting, inactivity and standing Pain improves with: rest, heat/ice, medication and TENS Relief from Meds: 6  Mobility walk without assistance ability to climb steps?  yes do you drive?  yes needs help with transfers  Function disabled: date disabled .  Neuro/Psych weakness numbness tremor tingling trouble walking spasms dizziness confusion depression anxiety  Prior Studies Any changes since last visit?  no  Physicians involved in your care Any changes since last visit?  no   Family History  Problem Relation Age of Onset  . Diabetes type II Mother   . Colon polyps Mother   . Diverticulitis Mother   . Diabetes Mother   . Heart disease Mother   . Diabetes type II Brother   . Uterine cancer    . Ovarian cancer     History   Social History  . Marital Status: Married    Spouse Name: N/A    Number of Children: 2  . Years of Education: N/A   Occupational History  . Disability    Social History Main Topics  . Smoking status: Former Smoker    Types: Cigarettes    Quit date: 06/30/2011  . Smokeless tobacco: Never Used  . Alcohol Use: No  . Drug Use: No  . Sexual Activity: Yes   Other Topics Concern  . None   Social History Narrative   Past Surgical History  Procedure Laterality  Date  . Cesarean section    . Partial hysterectomy    . Tonsillectomy    . Left elbow surgery     Past Medical History  Diagnosis Date  . Fibromyalgia   . Anxiety   . Depression   . Degenerative disc disease     Spinal, small syrinx  . Chronic pain     Dr. Letta Pate  . GERD (gastroesophageal reflux disease)   . Esophageal stricture     Status post dilatation  . Syncope     Negative tilt table test  . Palpitations     No documented arrhythmias  . Hiatal hernia   . Internal hemorrhoids without mention of complication   . Personal history of colonic polyps 10/04/2000    HYPERPLASTIC POLYP  . Irritable bowel syndrome   . POTS (postural orthostatic tachycardia syndrome)    BP 106/46 mmHg  Pulse 72  Resp 20  SpO2 100%  Opioid Risk Score:   Fall Risk Score: Low Fall Risk (0-5 points) (educated and given handout previously) Review of Systems  Musculoskeletal: Positive for back pain and gait problem.       Spasms  Neurological: Positive for dizziness, tremors, weakness and numbness.       Tingling  Psychiatric/Behavioral: Positive for confusion.  All other systems reviewed and are negative.  Objective:   Physical Exam  Constitutional: She is oriented to person, place, and time. She appears well-developed and well-nourished.  HENT:  Head: Normocephalic and atraumatic.  Neck: Normal range of motion. Neck supple.  Cardiovascular: Normal rate and regular rhythm.   Pulmonary/Chest: Effort normal and breath sounds normal.  Musculoskeletal:  Normal Muscle Bulk and Muscle Testing Reveals: Upper Extremities: Full ROM and Muscle strength 5/5 Back without spinal or Paraspinal tenderness Lower Extremities: Full ROM and Muscle strength 5/5 Arises from chair with ease Narrow Based gait  Neurological: She is alert and oriented to person, place, and time.  Skin: Skin is warm and dry.  Psychiatric: She has a normal mood and affect.  Nursing note and vitals  reviewed.         Assessment & Plan:  1. 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  3. Fibromyalgia: Continue with Heat and exercise therapy.  4.Depression and anxiety: Psychiatry Following  5. Orthostatic hypotension: .Cardiology Following  6. Peripheral Neuropathy: ContinueGabapentin   20 minutes of face to face patient care time was spent during this visit. All questions were encouraged and answered.

## 2014-09-18 ENCOUNTER — Encounter: Payer: Medicare Other | Attending: Physical Medicine & Rehabilitation | Admitting: Registered Nurse

## 2014-09-18 ENCOUNTER — Encounter: Payer: Self-pay | Admitting: Registered Nurse

## 2014-09-18 VITALS — BP 102/78 | HR 95

## 2014-09-18 DIAGNOSIS — K648 Other hemorrhoids: Secondary | ICD-10-CM | POA: Diagnosis not present

## 2014-09-18 DIAGNOSIS — R55 Syncope and collapse: Secondary | ICD-10-CM | POA: Insufficient documentation

## 2014-09-18 DIAGNOSIS — M5136 Other intervertebral disc degeneration, lumbar region: Secondary | ICD-10-CM | POA: Diagnosis not present

## 2014-09-18 DIAGNOSIS — G8929 Other chronic pain: Secondary | ICD-10-CM | POA: Insufficient documentation

## 2014-09-18 DIAGNOSIS — M47892 Other spondylosis, cervical region: Secondary | ICD-10-CM | POA: Diagnosis not present

## 2014-09-18 DIAGNOSIS — K222 Esophageal obstruction: Secondary | ICD-10-CM | POA: Insufficient documentation

## 2014-09-18 DIAGNOSIS — K219 Gastro-esophageal reflux disease without esophagitis: Secondary | ICD-10-CM | POA: Insufficient documentation

## 2014-09-18 DIAGNOSIS — M542 Cervicalgia: Secondary | ICD-10-CM | POA: Diagnosis not present

## 2014-09-18 DIAGNOSIS — M545 Low back pain: Secondary | ICD-10-CM | POA: Diagnosis not present

## 2014-09-18 DIAGNOSIS — R000 Tachycardia, unspecified: Secondary | ICD-10-CM | POA: Diagnosis not present

## 2014-09-18 DIAGNOSIS — Z599 Problem related to housing and economic circumstances, unspecified: Secondary | ICD-10-CM | POA: Insufficient documentation

## 2014-09-18 DIAGNOSIS — R51 Headache: Secondary | ICD-10-CM | POA: Insufficient documentation

## 2014-09-18 DIAGNOSIS — I951 Orthostatic hypotension: Secondary | ICD-10-CM | POA: Insufficient documentation

## 2014-09-18 DIAGNOSIS — F329 Major depressive disorder, single episode, unspecified: Secondary | ICD-10-CM | POA: Insufficient documentation

## 2014-09-18 DIAGNOSIS — Z86010 Personal history of colonic polyps: Secondary | ICD-10-CM | POA: Diagnosis not present

## 2014-09-18 DIAGNOSIS — F419 Anxiety disorder, unspecified: Secondary | ICD-10-CM | POA: Diagnosis not present

## 2014-09-18 DIAGNOSIS — Z760 Encounter for issue of repeat prescription: Secondary | ICD-10-CM | POA: Insufficient documentation

## 2014-09-18 DIAGNOSIS — K449 Diaphragmatic hernia without obstruction or gangrene: Secondary | ICD-10-CM | POA: Diagnosis not present

## 2014-09-18 DIAGNOSIS — M797 Fibromyalgia: Secondary | ICD-10-CM | POA: Insufficient documentation

## 2014-09-18 DIAGNOSIS — M47812 Spondylosis without myelopathy or radiculopathy, cervical region: Secondary | ICD-10-CM

## 2014-09-18 DIAGNOSIS — Z79899 Other long term (current) drug therapy: Secondary | ICD-10-CM

## 2014-09-18 DIAGNOSIS — K589 Irritable bowel syndrome without diarrhea: Secondary | ICD-10-CM | POA: Diagnosis not present

## 2014-09-18 DIAGNOSIS — R002 Palpitations: Secondary | ICD-10-CM | POA: Diagnosis not present

## 2014-09-18 DIAGNOSIS — K088 Other specified disorders of teeth and supporting structures: Secondary | ICD-10-CM | POA: Insufficient documentation

## 2014-09-18 DIAGNOSIS — G90A Postural orthostatic tachycardia syndrome (POTS): Secondary | ICD-10-CM

## 2014-09-18 DIAGNOSIS — G894 Chronic pain syndrome: Secondary | ICD-10-CM

## 2014-09-18 DIAGNOSIS — Z5181 Encounter for therapeutic drug level monitoring: Secondary | ICD-10-CM

## 2014-09-18 DIAGNOSIS — Z87891 Personal history of nicotine dependence: Secondary | ICD-10-CM | POA: Insufficient documentation

## 2014-09-18 MED ORDER — HYDROCODONE-ACETAMINOPHEN 10-325 MG PO TABS: 2.0000 | ORAL_TABLET | Freq: Three times a day (TID) | ORAL | Status: DC | PRN
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
## 2014-10-16 ENCOUNTER — Ambulatory Visit: Payer: Medicare Other | Admitting: Physical Medicine & Rehabilitation

## 2014-10-19 ENCOUNTER — Other Ambulatory Visit: Payer: Self-pay | Admitting: *Deleted

## 2014-10-19 ENCOUNTER — Ambulatory Visit (HOSPITAL_BASED_OUTPATIENT_CLINIC_OR_DEPARTMENT_OTHER): Payer: Medicare Other | Admitting: Physical Medicine & Rehabilitation

## 2014-10-19 DIAGNOSIS — M542 Cervicalgia: Secondary | ICD-10-CM | POA: Insufficient documentation

## 2014-10-19 DIAGNOSIS — K219 Gastro-esophageal reflux disease without esophagitis: Secondary | ICD-10-CM | POA: Insufficient documentation

## 2014-10-19 DIAGNOSIS — K222 Esophageal obstruction: Secondary | ICD-10-CM | POA: Insufficient documentation

## 2014-10-19 DIAGNOSIS — M47892 Other spondylosis, cervical region: Secondary | ICD-10-CM | POA: Insufficient documentation

## 2014-10-19 DIAGNOSIS — M5136 Other intervertebral disc degeneration, lumbar region: Secondary | ICD-10-CM | POA: Insufficient documentation

## 2014-10-19 DIAGNOSIS — F419 Anxiety disorder, unspecified: Secondary | ICD-10-CM | POA: Insufficient documentation

## 2014-10-19 DIAGNOSIS — R002 Palpitations: Secondary | ICD-10-CM | POA: Insufficient documentation

## 2014-10-19 DIAGNOSIS — M545 Low back pain: Secondary | ICD-10-CM | POA: Insufficient documentation

## 2014-10-19 DIAGNOSIS — R55 Syncope and collapse: Secondary | ICD-10-CM | POA: Insufficient documentation

## 2014-10-19 DIAGNOSIS — K589 Irritable bowel syndrome without diarrhea: Secondary | ICD-10-CM | POA: Insufficient documentation

## 2014-10-19 DIAGNOSIS — I951 Orthostatic hypotension: Secondary | ICD-10-CM | POA: Insufficient documentation

## 2014-10-19 DIAGNOSIS — K648 Other hemorrhoids: Secondary | ICD-10-CM | POA: Insufficient documentation

## 2014-10-19 DIAGNOSIS — F329 Major depressive disorder, single episode, unspecified: Secondary | ICD-10-CM | POA: Insufficient documentation

## 2014-10-19 DIAGNOSIS — R000 Tachycardia, unspecified: Secondary | ICD-10-CM | POA: Insufficient documentation

## 2014-10-19 DIAGNOSIS — R51 Headache: Secondary | ICD-10-CM | POA: Insufficient documentation

## 2014-10-19 DIAGNOSIS — M797 Fibromyalgia: Secondary | ICD-10-CM | POA: Insufficient documentation

## 2014-10-19 DIAGNOSIS — K449 Diaphragmatic hernia without obstruction or gangrene: Secondary | ICD-10-CM | POA: Insufficient documentation

## 2014-10-19 DIAGNOSIS — Z87891 Personal history of nicotine dependence: Secondary | ICD-10-CM | POA: Insufficient documentation

## 2014-10-19 DIAGNOSIS — Z86010 Personal history of colonic polyps: Secondary | ICD-10-CM | POA: Insufficient documentation

## 2014-10-19 DIAGNOSIS — G8929 Other chronic pain: Secondary | ICD-10-CM | POA: Insufficient documentation

## 2014-10-19 DIAGNOSIS — Z760 Encounter for issue of repeat prescription: Secondary | ICD-10-CM | POA: Insufficient documentation

## 2014-10-19 DIAGNOSIS — K088 Other specified disorders of teeth and supporting structures: Secondary | ICD-10-CM | POA: Insufficient documentation

## 2014-10-19 DIAGNOSIS — Z599 Problem related to housing and economic circumstances, unspecified: Secondary | ICD-10-CM | POA: Insufficient documentation

## 2014-10-19 MED ORDER — HYDROCODONE-ACETAMINOPHEN 10-325 MG PO TABS: 2.0000 | ORAL_TABLET | Freq: Three times a day (TID) | ORAL | Status: DC | PRN

## 2014-10-19 NOTE — Telephone Encounter (Signed)
[facility] rx printed for hydrocodone until 10/22/14

## 2014-10-22 ENCOUNTER — Encounter: Payer: Self-pay | Admitting: Registered Nurse

## 2014-10-22 ENCOUNTER — Encounter: Payer: Medicare Other | Attending: Physical Medicine & Rehabilitation | Admitting: Registered Nurse

## 2014-10-22 VITALS — BP 122/68 | HR 102 | Resp 16

## 2014-10-22 DIAGNOSIS — M791 Myalgia: Secondary | ICD-10-CM

## 2014-10-22 DIAGNOSIS — K648 Other hemorrhoids: Secondary | ICD-10-CM | POA: Diagnosis not present

## 2014-10-22 DIAGNOSIS — Z5181 Encounter for therapeutic drug level monitoring: Secondary | ICD-10-CM

## 2014-10-22 DIAGNOSIS — R000 Tachycardia, unspecified: Secondary | ICD-10-CM | POA: Diagnosis not present

## 2014-10-22 DIAGNOSIS — M542 Cervicalgia: Secondary | ICD-10-CM | POA: Diagnosis not present

## 2014-10-22 DIAGNOSIS — Z79899 Other long term (current) drug therapy: Secondary | ICD-10-CM

## 2014-10-22 DIAGNOSIS — G894 Chronic pain syndrome: Secondary | ICD-10-CM

## 2014-10-22 DIAGNOSIS — M5136 Other intervertebral disc degeneration, lumbar region: Secondary | ICD-10-CM | POA: Diagnosis not present

## 2014-10-22 DIAGNOSIS — Z599 Problem related to housing and economic circumstances, unspecified: Secondary | ICD-10-CM | POA: Diagnosis not present

## 2014-10-22 DIAGNOSIS — IMO0001 Reserved for inherently not codable concepts without codable children: Secondary | ICD-10-CM

## 2014-10-22 DIAGNOSIS — K589 Irritable bowel syndrome without diarrhea: Secondary | ICD-10-CM | POA: Diagnosis not present

## 2014-10-22 DIAGNOSIS — G8929 Other chronic pain: Secondary | ICD-10-CM | POA: Diagnosis not present

## 2014-10-22 DIAGNOSIS — Z87891 Personal history of nicotine dependence: Secondary | ICD-10-CM | POA: Diagnosis not present

## 2014-10-22 DIAGNOSIS — K088 Other specified disorders of teeth and supporting structures: Secondary | ICD-10-CM | POA: Diagnosis not present

## 2014-10-22 DIAGNOSIS — K219 Gastro-esophageal reflux disease without esophagitis: Secondary | ICD-10-CM | POA: Diagnosis not present

## 2014-10-22 DIAGNOSIS — M47892 Other spondylosis, cervical region: Secondary | ICD-10-CM | POA: Diagnosis not present

## 2014-10-22 DIAGNOSIS — Z86010 Personal history of colonic polyps: Secondary | ICD-10-CM | POA: Diagnosis not present

## 2014-10-22 DIAGNOSIS — M47817 Spondylosis without myelopathy or radiculopathy, lumbosacral region: Secondary | ICD-10-CM

## 2014-10-22 DIAGNOSIS — M797 Fibromyalgia: Secondary | ICD-10-CM

## 2014-10-22 DIAGNOSIS — F329 Major depressive disorder, single episode, unspecified: Secondary | ICD-10-CM | POA: Diagnosis not present

## 2014-10-22 DIAGNOSIS — K222 Esophageal obstruction: Secondary | ICD-10-CM | POA: Diagnosis not present

## 2014-10-22 DIAGNOSIS — I951 Orthostatic hypotension: Secondary | ICD-10-CM | POA: Diagnosis not present

## 2014-10-22 DIAGNOSIS — M47812 Spondylosis without myelopathy or radiculopathy, cervical region: Secondary | ICD-10-CM

## 2014-10-22 DIAGNOSIS — M545 Low back pain: Secondary | ICD-10-CM | POA: Diagnosis not present

## 2014-10-22 DIAGNOSIS — R51 Headache: Secondary | ICD-10-CM | POA: Diagnosis not present

## 2014-10-22 DIAGNOSIS — G90A Postural orthostatic tachycardia syndrome (POTS): Secondary | ICD-10-CM

## 2014-10-22 DIAGNOSIS — F419 Anxiety disorder, unspecified: Secondary | ICD-10-CM | POA: Diagnosis not present

## 2014-10-22 DIAGNOSIS — Z760 Encounter for issue of repeat prescription: Secondary | ICD-10-CM | POA: Diagnosis not present

## 2014-10-22 DIAGNOSIS — R55 Syncope and collapse: Secondary | ICD-10-CM | POA: Diagnosis not present

## 2014-10-22 DIAGNOSIS — M609 Myositis, unspecified: Secondary | ICD-10-CM

## 2014-10-22 DIAGNOSIS — R002 Palpitations: Secondary | ICD-10-CM | POA: Diagnosis not present

## 2014-10-22 DIAGNOSIS — K449 Diaphragmatic hernia without obstruction or gangrene: Secondary | ICD-10-CM | POA: Diagnosis not present

## 2014-10-22 MED ORDER — HYDROCODONE-ACETAMINOPHEN 10-325 MG PO TABS: 2.0000 | ORAL_TABLET | Freq: Three times a day (TID) | ORAL | Status: DC | PRN

## 2014-10-22 NOTE — Progress Notes (Signed)
Subjective:    Patient ID: [name], female    DOB: [birth year], 39 y.o.   MRN: [id number]  HPI:  [name] is a 39 year old female who returns for follow up for chronic pain and medication refill. She says her pain is located in her head, neck, right shoulder and  lower back. She rates her pain 7. Her current exercise regime is performing stretching exercises, leg lifts, squats and walking. She states at times she had discharged from her nipples can't remember the last occurrence she was instructed to follow up with her OB/Gyn she verbalizes understanding. Also states she has noted lymph node swelling in her right chest, she will follow up with her PCP.  Arrived to office tachycardic pulse rechecked 95. PHQ-9 Score 15 she is seeing a psychiatrist Dr. Jacinta Shoe at [facility].  Pain Inventory Average Pain 6 Pain Right Now 7 My pain is constant, sharp, burning, dull, stabbing, tingling and aching  In the last 24 hours, has pain interfered with the following? General activity 8 Relation with others 9 Enjoyment of life 8 What TIME of day is your pain at its worst? morning and evening Sleep (in general) Poor  Pain is worse with: walking, bending, sitting, standing and some activites Pain improves with: rest, heat/ice, medication and TENS Relief from Meds: 7  Mobility walk without assistance how many minutes can you walk? 15 ability to climb steps?  yes do you drive?  yes  Function disabled: date disabled .  Neuro/Psych weakness depression anxiety  Prior Studies Any changes since last visit?  no  Physicians involved in your care Any changes since last visit?  no   Family History  Problem Relation Age of Onset  . Diabetes type II Mother   . Colon polyps Mother   . Diverticulitis Mother   . Diabetes Mother   . Heart disease Mother   . Diabetes type II Brother   . Uterine cancer    . Ovarian cancer     History   Social  History  . Marital Status: Married    Spouse Name: N/A  . Number of Children: 2  . Years of Education: N/A   Occupational History  . Disability    Social History Main Topics  . Smoking status: Former Smoker    Types: Cigarettes    Quit date: 06/30/2011  . Smokeless tobacco: Never Used  . Alcohol Use: No  . Drug Use: No  . Sexual Activity: Yes   Other Topics Concern  . None   Social History Narrative   Past Surgical History  Procedure Laterality Date  . Cesarean section    . Partial hysterectomy    . Tonsillectomy    . Left elbow surgery     Past Medical History  Diagnosis Date  . Fibromyalgia   . Anxiety   . Depression   . Degenerative disc disease     Spinal, small syrinx  . Chronic pain     Dr. Letta Pate  . GERD (gastroesophageal reflux disease)   . Esophageal stricture     Status post dilatation  . Syncope     Negative tilt table test  . Palpitations     No documented arrhythmias  . Hiatal hernia   . Internal hemorrhoids without mention of complication   . Personal history of colonic polyps 10/04/2000    HYPERPLASTIC POLYP  . Irritable bowel syndrome   . POTS (postural orthostatic tachycardia syndrome)  BP 122/68 mmHg  Pulse 102  Resp 16  SpO2 99%  Opioid Risk Score:   Fall Risk Score: Low Fall Risk (0-5 points)`1  Depression screen PHQ 2/9 score 15  No flowsheet data found.   Review of Systems  HENT: Negative.   Eyes: Negative.   Respiratory: Negative.   Cardiovascular: Negative.   Gastrointestinal: Negative.   Endocrine: Negative.   Genitourinary: Negative.   Musculoskeletal: Positive for myalgias, back pain, arthralgias and neck pain.  Skin: Negative.   Allergic/Immunologic: Negative.   Neurological: Positive for weakness.  Hematological: Negative.   Psychiatric/Behavioral: Positive for decreased concentration. The patient is nervous/anxious.        Objective:   Physical Exam  Constitutional: She is oriented to person, place,  and time. She appears well-developed and well-nourished.  Neck: Normal range of motion. Neck supple.  Cardiovascular: Normal rate and regular rhythm.   Pulmonary/Chest: Effort normal and breath sounds normal.  Right Chest Tenderness at 2nd intercostal space slight inflammation  Musculoskeletal:  Normal Muscle Bulk and Muscle Testing Reveals: Upper Extremities: Full ROM and Muscle Strength 5/5 Back without spinal or paraspinal tenderness Lower Extremities: Full ROM and Muscle Strength 5/5 Arises from chair with ease Narrow Based gait  Neurological: She is alert and oriented to person, place, and time.  Skin: Skin is warm and dry.  Psychiatric: She has a normal mood and affect.  Nursing note and vitals reviewed.         Assessment & Plan:  1. 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  3. Fibromyalgia: Continue with Heat and exercise therapy.  4.Depression and anxiety: Psychiatry Following  5. Orthostatic hypotension: .Cardiology Following  6. Peripheral Neuropathy: ContinueGabapentin   20 minutes of face to face patient care time was spent during this visit. All questions were encouraged and answered.

## 2014-11-19 ENCOUNTER — Ambulatory Visit: Payer: Medicare Other | Admitting: Physical Medicine & Rehabilitation

## 2014-11-22 ENCOUNTER — Encounter: Payer: Self-pay | Admitting: Physical Medicine & Rehabilitation

## 2014-11-22 ENCOUNTER — Ambulatory Visit (HOSPITAL_BASED_OUTPATIENT_CLINIC_OR_DEPARTMENT_OTHER): Payer: Medicare Other | Admitting: Physical Medicine & Rehabilitation

## 2014-11-22 ENCOUNTER — Encounter: Payer: Medicare Other | Attending: Physical Medicine & Rehabilitation

## 2014-11-22 ENCOUNTER — Other Ambulatory Visit: Payer: Self-pay | Admitting: Physical Medicine & Rehabilitation

## 2014-11-22 VITALS — BP 96/52 | HR 110 | Resp 14

## 2014-11-22 DIAGNOSIS — R55 Syncope and collapse: Secondary | ICD-10-CM | POA: Diagnosis not present

## 2014-11-22 DIAGNOSIS — M5136 Other intervertebral disc degeneration, lumbar region: Secondary | ICD-10-CM | POA: Diagnosis not present

## 2014-11-22 DIAGNOSIS — Z5181 Encounter for therapeutic drug level monitoring: Secondary | ICD-10-CM

## 2014-11-22 DIAGNOSIS — R000 Tachycardia, unspecified: Secondary | ICD-10-CM | POA: Diagnosis not present

## 2014-11-22 DIAGNOSIS — M797 Fibromyalgia: Secondary | ICD-10-CM | POA: Insufficient documentation

## 2014-11-22 DIAGNOSIS — M47812 Spondylosis without myelopathy or radiculopathy, cervical region: Secondary | ICD-10-CM

## 2014-11-22 DIAGNOSIS — F419 Anxiety disorder, unspecified: Secondary | ICD-10-CM | POA: Diagnosis not present

## 2014-11-22 DIAGNOSIS — K222 Esophageal obstruction: Secondary | ICD-10-CM | POA: Insufficient documentation

## 2014-11-22 DIAGNOSIS — R002 Palpitations: Secondary | ICD-10-CM | POA: Diagnosis not present

## 2014-11-22 DIAGNOSIS — K589 Irritable bowel syndrome without diarrhea: Secondary | ICD-10-CM | POA: Insufficient documentation

## 2014-11-22 DIAGNOSIS — G8929 Other chronic pain: Secondary | ICD-10-CM | POA: Diagnosis not present

## 2014-11-22 DIAGNOSIS — Z599 Problem related to housing and economic circumstances, unspecified: Secondary | ICD-10-CM | POA: Insufficient documentation

## 2014-11-22 DIAGNOSIS — K648 Other hemorrhoids: Secondary | ICD-10-CM | POA: Insufficient documentation

## 2014-11-22 DIAGNOSIS — K219 Gastro-esophageal reflux disease without esophagitis: Secondary | ICD-10-CM | POA: Insufficient documentation

## 2014-11-22 DIAGNOSIS — K449 Diaphragmatic hernia without obstruction or gangrene: Secondary | ICD-10-CM | POA: Diagnosis not present

## 2014-11-22 DIAGNOSIS — M542 Cervicalgia: Secondary | ICD-10-CM | POA: Insufficient documentation

## 2014-11-22 DIAGNOSIS — M545 Low back pain: Secondary | ICD-10-CM | POA: Diagnosis not present

## 2014-11-22 DIAGNOSIS — Z87891 Personal history of nicotine dependence: Secondary | ICD-10-CM | POA: Diagnosis not present

## 2014-11-22 DIAGNOSIS — K088 Other specified disorders of teeth and supporting structures: Secondary | ICD-10-CM | POA: Diagnosis not present

## 2014-11-22 DIAGNOSIS — I951 Orthostatic hypotension: Secondary | ICD-10-CM | POA: Insufficient documentation

## 2014-11-22 DIAGNOSIS — Z79899 Other long term (current) drug therapy: Secondary | ICD-10-CM | POA: Diagnosis not present

## 2014-11-22 DIAGNOSIS — Z760 Encounter for issue of repeat prescription: Secondary | ICD-10-CM | POA: Insufficient documentation

## 2014-11-22 DIAGNOSIS — G894 Chronic pain syndrome: Secondary | ICD-10-CM

## 2014-11-22 DIAGNOSIS — M47892 Other spondylosis, cervical region: Secondary | ICD-10-CM | POA: Diagnosis not present

## 2014-11-22 DIAGNOSIS — R51 Headache: Secondary | ICD-10-CM | POA: Insufficient documentation

## 2014-11-22 DIAGNOSIS — Z86010 Personal history of colonic polyps: Secondary | ICD-10-CM | POA: Diagnosis not present

## 2014-11-22 DIAGNOSIS — M4647 Discitis, unspecified, lumbosacral region: Secondary | ICD-10-CM

## 2014-11-22 DIAGNOSIS — M519 Unspecified thoracic, thoracolumbar and lumbosacral intervertebral disc disorder: Secondary | ICD-10-CM

## 2014-11-22 DIAGNOSIS — F329 Major depressive disorder, single episode, unspecified: Secondary | ICD-10-CM | POA: Diagnosis not present

## 2014-11-22 MED ORDER — HYDROCODONE-ACETAMINOPHEN 10-325 MG PO TABS: 2.0000 | ORAL_TABLET | Freq: Three times a day (TID) | ORAL | Status: DC | PRN

## 2014-11-22 NOTE — Patient Instructions (Signed)
Make appt with Dr Arnoldo Morale to follow up on Syringomyelia

## 2014-11-22 NOTE — Progress Notes (Signed)
Subjective:    Patient ID: [name], female    DOB: 02/06/1976, 39 y.o.   MRN: [id number]  HPI A 39 year old female with chronic neck and back pain. 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 She has a tiny syrinx at C6-C7, small syrinx at T7 and T11. She has no progressive weakness, no progressive gait disorder. She does have her chronic pain which is more widespread. Patient also complains of migraine headaches on a daily basis. We also discussed that short acting pain medication such as hydrocodone can also cause similar symptoms. Pain Inventory Average Pain 7 Pain Right Now 6 My pain is constant, sharp, burning, dull, stabbing, tingling and aching  In the last 24 hours, has pain interfered with the following? General activity 4 Relation with others 4 Enjoyment of life 4 What TIME of day is your pain at its worst? daytime Sleep (in general) Poor  Pain is worse with: walking, bending, sitting, standing and some activites Pain improves with: rest, heat/ice, therapy/exercise and medication Relief from Meds: 7  Mobility walk without assistance how many minutes can you walk? 15 ability to climb steps?  yes do you drive?  yes  Function disabled: date disabled .  Neuro/Psych weakness numbness tremor tingling trouble walking spasms dizziness confusion depression anxiety  Prior Studies Any changes since last visit?  no  Physicians involved in your care Any changes since last visit?  no   Family History  Problem Relation Age of Onset  . Diabetes type II Mother   . Colon polyps Mother   . Diverticulitis Mother   . Diabetes Mother   . Heart disease Mother   . Diabetes type II Brother   . Uterine cancer    . Ovarian cancer     History   Social History  . Marital Status: Married    Spouse Name: N/A  . Number of Children: 2  . Years of Education: N/A   Occupational History    . Disability    Social History Main Topics  . Smoking status: Former Smoker    Types: Cigarettes    Quit date: 06/30/2011  . Smokeless tobacco: Never Used  . Alcohol Use: No  . Drug Use: No  . Sexual Activity: Yes   Other Topics Concern  . None   Social History Narrative   Past Surgical History  Procedure Laterality Date  . Cesarean section    . Partial hysterectomy    . Tonsillectomy    . Left elbow surgery     Past Medical History  Diagnosis Date  . Fibromyalgia   . Anxiety   . Depression   . Degenerative disc disease     Spinal, small syrinx  . Chronic pain     Dr. Letta Pate  . GERD (gastroesophageal reflux disease)   . Esophageal stricture     Status post dilatation  . Syncope     Negative tilt table test  . Palpitations     No documented arrhythmias  . Hiatal hernia   . Internal hemorrhoids without mention of complication   . Personal history of colonic polyps 10/04/2000    HYPERPLASTIC POLYP  . Irritable bowel syndrome   . POTS (postural orthostatic tachycardia syndrome)    BP 96/52 mmHg  Pulse 110  Resp 14  SpO2 98%  Opioid Risk Score:   Fall Risk Score: Low Fall Risk (0-5 points)`1  Depression screen [facility] 2/9  Depression screen PHQ 2/9 10/22/2014  Decreased Interest 2  Down, Depressed, Hopeless 3  PHQ - 2 Score 5  Altered sleeping 2  Tired, decreased energy 3  Change in appetite 0  Feeling bad or failure about yourself  2  Trouble concentrating 1  Moving slowly or fidgety/restless 2  Suicidal thoughts 0  PHQ-9 Score 15     Review of Systems  Constitutional: Positive for chills and appetite change.  Eyes: Negative.   Respiratory: Negative.   Cardiovascular: Negative.   Gastrointestinal: Positive for constipation.  Endocrine: Negative.   Genitourinary: Negative.   Musculoskeletal: Positive for myalgias, back pain and arthralgias.  Skin: Positive for rash.  Allergic/Immunologic: Negative.   Neurological: Positive for dizziness,  tremors, weakness, numbness and headaches.       Tingling, trouble walking, spasms  Hematological: Negative.   Psychiatric/Behavioral: Positive for dysphoric mood. The patient is nervous/anxious.        Objective:   Physical Exam  Constitutional: She is oriented to person, place, and time. She appears well-developed and well-nourished.  HENT:  Head: Normocephalic and atraumatic.  Eyes: Conjunctivae and EOM are normal. Pupils are equal, round, and reactive to light.  Neck: Normal range of motion.  Musculoskeletal:       Right hip: Normal.       Left hip: Normal.       Right knee: Normal.       Left knee: Normal.       Cervical back: She exhibits tenderness. She exhibits normal range of motion.       Thoracic back: She exhibits tenderness. She exhibits normal range of motion.       Lumbar back: She exhibits decreased range of motion and tenderness. She exhibits no deformity and no spasm.  Neurological: She is alert and oriented to person, place, and time.  Psychiatric: She has a normal mood and affect.  Nursing note and vitals reviewed. Motor strength is 5/5 bilateral deltoids, biceps, triceps, grip, hip flexor, knee extensor, ankle dorsi flexors and plantar flexor        Assessment & Plan:  1. Chronic low back pain with lumbar spondylosis as well as lumbar  degenerative disc No radicular symptoms. No further imaging studies needed at this point. Continue hydrocodone 20 mg every 8 hours  2. Chronic neck pain with cervical spondylosis, no radicular symptoms. Patient has had no worsening of symptoms recently. No further imaging studies recommended at this point, continue current pain medications.  3. Chronic headaches question if this is medication overuse headache versus true migraine. We discussed potential of Topamax as a migraine prophylactic medication in place of gabapentin which is been used more for her chronic pain and fibromyalgia syndrome. Patient will think about this.  If she elects to try the Topamax we would start at 25 mg twice a day and workup from there. Also would stop the gabapentin. 4. Fibromyalgia syndromeRecommend continued efforts at mobility she is quite active at home. May continue gabapentin for now.  5. Chronic pain syndrome with discussed long-term effects of opiates and that in her case it may be contributing to her feelings of fatigue and decreased memory. We discussed that we may decrease the hydrocodone 7.5 mg 2 by mouth 3 times a day however she is uncertain that she wants to try this at this point. She will discuss this next month with nurse practitioner.

## 2014-11-23 LAB — PMP ALCOHOL METABOLITE (ETG): ETGU: NEGATIVE ng/mL

## 2014-11-27 LAB — PRESCRIPTION MONITORING PROFILE (SOLSTAS)
Barbiturate Screen, Urine: NEGATIVE ng/mL
Buprenorphine, Urine: NEGATIVE ng/mL
CANNABINOID SCRN UR: NEGATIVE ng/mL
CREATININE, URINE: 492.69 mg/dL (ref >20.0)
Carisoprodol, Urine: NEGATIVE ng/mL
Cocaine Metabolites: NEGATIVE ng/mL
Fentanyl, Ur: NEGATIVE ng/mL
MDMA URINE: NEGATIVE ng/mL
Meperidine, Ur: NEGATIVE ng/mL
Methadone Screen, Urine: NEGATIVE ng/mL
Nitrites, Initial: NEGATIVE ug/mL
Oxycodone Screen, Ur: NEGATIVE ng/mL
Propoxyphene: NEGATIVE ng/mL
Tapentadol, urine: NEGATIVE ng/mL
Tramadol Scrn, Ur: NEGATIVE ng/mL
Zolpidem, Urine: NEGATIVE ng/mL
pH, Initial: 6.8 pH (ref 4.5–8.9)

## 2014-11-27 LAB — AMPHETAMINES (GC/LC/MS), URINE
Amphetamine GC/MS Conf: 6515 ng/mL (ref <250)
Methamphetamine Quant, Ur: NEGATIVE ng/mL (ref <250)

## 2014-11-27 LAB — OPIATES/OPIOIDS (LC/MS-MS)
Codeine Urine: NEGATIVE ng/mL (ref <50)
HYDROCODONE: 1868 ng/mL (ref <50)
HYDROMORPHONE: 193 ng/mL (ref <50)
Morphine Urine: NEGATIVE ng/mL (ref <50)
NORHYDROCODONE, UR: 9828 ng/mL (ref <50)
NOROXYCODONE, UR: NEGATIVE ng/mL (ref <50)
OXYCODONE, UR: NEGATIVE ng/mL (ref <50)
OXYMORPHONE, URINE: NEGATIVE ng/mL (ref <50)

## 2014-11-27 LAB — BENZODIAZEPINES (GC/LC/MS), URINE
Alprazolam metabolite (GC/LC/MS), ur confirm: NEGATIVE ng/mL (ref <25)
Clonazepam metabolite (GC/LC/MS), ur confirm: >5000 ng/mL (ref <25)
Flurazepam metabolite (GC/LC/MS), ur confirm: NEGATIVE ng/mL (ref <50)
LORAZEPAMU: NEGATIVE ng/mL (ref <50)
MIDAZOLAMU: NEGATIVE ng/mL (ref <50)
NORDIAZEPAMU: NEGATIVE ng/mL (ref <50)
Oxazepam (GC/LC/MS), ur confirm: NEGATIVE ng/mL (ref <50)
TEMAZEPAMU: NEGATIVE ng/mL (ref <50)
TRIAZOLAMU: NEGATIVE ng/mL (ref <50)

## 2014-12-18 NOTE — Progress Notes (Signed)
Urine drug screen for this encounter is consistent for prescribed medication 

## 2014-12-19 ENCOUNTER — Encounter: Payer: Medicare Other | Attending: Physical Medicine & Rehabilitation | Admitting: Registered Nurse

## 2014-12-19 ENCOUNTER — Encounter: Payer: Self-pay | Admitting: Registered Nurse

## 2014-12-19 VITALS — BP 126/70 | HR 109 | Resp 14

## 2014-12-19 DIAGNOSIS — K648 Other hemorrhoids: Secondary | ICD-10-CM | POA: Insufficient documentation

## 2014-12-19 DIAGNOSIS — F329 Major depressive disorder, single episode, unspecified: Secondary | ICD-10-CM | POA: Insufficient documentation

## 2014-12-19 DIAGNOSIS — K589 Irritable bowel syndrome without diarrhea: Secondary | ICD-10-CM | POA: Diagnosis not present

## 2014-12-19 DIAGNOSIS — M5136 Other intervertebral disc degeneration, lumbar region: Secondary | ICD-10-CM | POA: Diagnosis not present

## 2014-12-19 DIAGNOSIS — Z760 Encounter for issue of repeat prescription: Secondary | ICD-10-CM | POA: Diagnosis present

## 2014-12-19 DIAGNOSIS — M47812 Spondylosis without myelopathy or radiculopathy, cervical region: Secondary | ICD-10-CM

## 2014-12-19 DIAGNOSIS — Z86010 Personal history of colonic polyps: Secondary | ICD-10-CM | POA: Diagnosis not present

## 2014-12-19 DIAGNOSIS — R55 Syncope and collapse: Secondary | ICD-10-CM | POA: Insufficient documentation

## 2014-12-19 DIAGNOSIS — K449 Diaphragmatic hernia without obstruction or gangrene: Secondary | ICD-10-CM | POA: Diagnosis not present

## 2014-12-19 DIAGNOSIS — I951 Orthostatic hypotension: Secondary | ICD-10-CM | POA: Insufficient documentation

## 2014-12-19 DIAGNOSIS — G8929 Other chronic pain: Secondary | ICD-10-CM | POA: Insufficient documentation

## 2014-12-19 DIAGNOSIS — K219 Gastro-esophageal reflux disease without esophagitis: Secondary | ICD-10-CM | POA: Insufficient documentation

## 2014-12-19 DIAGNOSIS — M797 Fibromyalgia: Secondary | ICD-10-CM | POA: Insufficient documentation

## 2014-12-19 DIAGNOSIS — Z599 Problem related to housing and economic circumstances, unspecified: Secondary | ICD-10-CM | POA: Insufficient documentation

## 2014-12-19 DIAGNOSIS — Z87891 Personal history of nicotine dependence: Secondary | ICD-10-CM | POA: Diagnosis not present

## 2014-12-19 DIAGNOSIS — IMO0001 Reserved for inherently not codable concepts without codable children: Secondary | ICD-10-CM

## 2014-12-19 DIAGNOSIS — M545 Low back pain: Secondary | ICD-10-CM | POA: Insufficient documentation

## 2014-12-19 DIAGNOSIS — K088 Other specified disorders of teeth and supporting structures: Secondary | ICD-10-CM | POA: Diagnosis not present

## 2014-12-19 DIAGNOSIS — M542 Cervicalgia: Secondary | ICD-10-CM | POA: Diagnosis not present

## 2014-12-19 DIAGNOSIS — M47892 Other spondylosis, cervical region: Secondary | ICD-10-CM | POA: Diagnosis not present

## 2014-12-19 DIAGNOSIS — K222 Esophageal obstruction: Secondary | ICD-10-CM | POA: Insufficient documentation

## 2014-12-19 DIAGNOSIS — R002 Palpitations: Secondary | ICD-10-CM | POA: Diagnosis not present

## 2014-12-19 DIAGNOSIS — R000 Tachycardia, unspecified: Secondary | ICD-10-CM | POA: Insufficient documentation

## 2014-12-19 DIAGNOSIS — F419 Anxiety disorder, unspecified: Secondary | ICD-10-CM | POA: Insufficient documentation

## 2014-12-19 DIAGNOSIS — M47817 Spondylosis without myelopathy or radiculopathy, lumbosacral region: Secondary | ICD-10-CM

## 2014-12-19 DIAGNOSIS — M609 Myositis, unspecified: Secondary | ICD-10-CM

## 2014-12-19 DIAGNOSIS — Z5181 Encounter for therapeutic drug level monitoring: Secondary | ICD-10-CM

## 2014-12-19 DIAGNOSIS — Z79899 Other long term (current) drug therapy: Secondary | ICD-10-CM

## 2014-12-19 DIAGNOSIS — G44229 Chronic tension-type headache, not intractable: Secondary | ICD-10-CM

## 2014-12-19 DIAGNOSIS — M791 Myalgia: Secondary | ICD-10-CM | POA: Diagnosis not present

## 2014-12-19 DIAGNOSIS — R51 Headache: Secondary | ICD-10-CM | POA: Diagnosis not present

## 2014-12-19 MED ORDER — TOPIRAMATE 25 MG PO TABS: 25.0000 mg | ORAL_TABLET | Freq: Two times a day (BID) | ORAL | Status: DC

## 2014-12-19 MED ORDER — HYDROCODONE-ACETAMINOPHEN 10-325 MG PO TABS: 2.0000 | ORAL_TABLET | Freq: Three times a day (TID) | ORAL | Status: DC | PRN

## 2014-12-19 NOTE — Patient Instructions (Signed)
Topiramate tablets What is this medicine? TOPIRAMATE (toe PYRE a mate) is used to treat seizures in adults or children with epilepsy. It is also used for the prevention of migraine headaches. This medicine may be used for other purposes; ask your health care provider or pharmacist if you have questions. COMMON BRAND NAME(S): Topamax, Topiragen What should I tell my health care provider before I take this medicine? They need to know if you have any of these conditions: -bleeding disorders -cirrhosis of the liver or liver disease -diarrhea -glaucoma -kidney stones or kidney disease -low blood counts, like low white cell, platelet, or red cell counts -lung disease like asthma, obstructive pulmonary disease, emphysema -metabolic acidosis -on a ketogenic diet -schedule for surgery or a procedure -suicidal thoughts, plans, or attempt; a previous suicide attempt by you or a family member -an unusual or allergic reaction to topiramate, other medicines, foods, dyes, or preservatives -pregnant or trying to get pregnant -breast-feeding How should I use this medicine? Take this medicine by mouth with a glass of water. Follow the directions on the prescription label. Do not crush or chew. You may take this medicine with meals. Take your medicine at regular intervals. Do not take it more often than directed. Talk to your pediatrician regarding the use of this medicine in children. Special care may be needed. While this drug may be prescribed for children as young as 86 years of age for selected conditions, precautions do apply. Overdosage: If you think you have taken too much of this medicine contact a poison control center or emergency room at once. NOTE: This medicine is only for you. Do not share this medicine with others. What if I miss a dose? If you miss a dose, take it as soon as you can. If your next dose is to be taken in less than 6 hours, then do not take the missed dose. Take the next dose at  your regular time. Do not take double or extra doses. What may interact with this medicine? Do not take this medicine with any of the following medications: -probenecid This medicine may also interact with the following medications: -acetazolamide -alcohol -amitriptyline -aspirin and aspirin-like medicines -birth control pills -certain medicines for depression -certain medicines for seizures -certain medicines that treat or prevent blood clots like warfarin, enoxaparin, dalteparin, apixaban, dabigatran, and rivaroxaban -digoxin -hydrochlorothiazide -lithium -medicines for pain, sleep, or muscle relaxation -metformin -methazolamide -NSAIDS, medicines for pain and inflammation, like ibuprofen or naproxen -pioglitazone -risperidone This list may not describe all possible interactions. Give your health care provider a list of all the medicines, herbs, non-prescription drugs, or dietary supplements you use. Also tell them if you smoke, drink alcohol, or use illegal drugs. Some items may interact with your medicine. What should I watch for while using this medicine? Visit your doctor or health care professional for regular checks on your progress. Do not stop taking this medicine suddenly. This increases the risk of seizures if you are using this medicine to control epilepsy. Wear a medical identification bracelet or chain to say you have epilepsy or seizures, and carry a card that lists all your medicines. This medicine can decrease sweating and increase your body temperature. Watch for signs of deceased sweating or fever, especially in children. Avoid extreme heat, hot baths, and saunas. Be careful about exercising, especially in hot weather. Contact your health care provider right away if you notice a fever or decrease in sweating. You should drink plenty of fluids while taking this medicine.  If you have had kidney stones in the past, this will help to reduce your chances of forming kidney  stones. If you have stomach pain, with nausea or vomiting and yellowing of your eyes or skin, call your doctor immediately. You may get drowsy, dizzy, or have blurred vision. Do not drive, use machinery, or do anything that needs mental alertness until you know how this medicine affects you. To reduce dizziness, do not sit or stand up quickly, especially if you are an older patient. Alcohol can increase drowsiness and dizziness. Avoid alcoholic drinks. If you notice blurred vision, eye pain, or other eye problems, seek medical attention at once for an eye exam. The use of this medicine may increase the chance of suicidal thoughts or actions. Pay special attention to how you are responding while on this medicine. Any worsening of mood, or thoughts of suicide or dying should be reported to your health care professional right away. This medicine may increase the chance of developing metabolic acidosis. If left untreated, this can cause kidney stones, bone disease, or slowed growth in children. Symptoms include breathing fast, fatigue, loss of appetite, irregular heartbeat, or loss of consciousness. Call your doctor immediately if you experience any of these side effects. Also, tell your doctor about any surgery you plan on having while taking this medicine since this may increase your risk for metabolic acidosis. Birth control pills may not work properly while you are taking this medicine. Talk to your doctor about using an extra method of birth control. Women who become pregnant while using this medicine may enroll in the [place] Pregnancy Registry by calling [phone]. This registry collects information about the safety of antiepileptic drug use during pregnancy. What side effects may I notice from receiving this medicine? Side effects that you should report to your doctor or health care professional as soon as possible: -allergic reactions like skin rash, itching or hives,  swelling of the face, lips, or tongue -decreased sweating and/or rise in body temperature -depression -difficulty breathing, fast or irregular breathing patterns -difficulty speaking -difficulty walking or controlling muscle movements -hearing impairment -redness, blistering, peeling or loosening of the skin, including inside the mouth -tingling, pain or numbness in the hands or feet -unusual bleeding or bruising -unusually weak or tired -worsening of mood, thoughts or actions of suicide or dying Side effects that usually do not require medical attention (report to your doctor or health care professional if they continue or are bothersome): -altered taste -back pain, joint or muscle aches and pains -diarrhea, or constipation -headache -loss of appetite -nausea -stomach upset, indigestion -tremors This list may not describe all possible side effects. Call your doctor for medical advice about side effects. You may report side effects to FDA at 1-800-FDA-1088. Where should I keep my medicine? Keep out of the reach of children. Store at room temperature between 15 and 30 degrees C (59 and 86 degrees F) in a tightly closed container. Protect from moisture. Throw away any unused medicine after the expiration date. NOTE: This sheet is a summary. It may not cover all possible information. If you have questions about this medicine, talk to your doctor, pharmacist, or health care provider.  2015, [employer]. (2013-07-24 23:17:57)

## 2014-12-19 NOTE — Progress Notes (Signed)
Subjective:    Patient ID: [name], female    DOB: 1976/02/06, 39 y.o.   MRN: [id number]  HPI: [name] is a 39 year old female who returns for follow up for chronic pain and medication refill. She says her pain is located in her head ( headache) and neck. She rates her pain 6. Her current exercise regime is walking. Spoke with Mrs. Thaker regarding tapering hydrocodone she's not ready to proceed with this modality. She will try Topamax as discussed with Dr. Letta Pate and Gabapentin discontinued.  She verbalizes understanding. Arrived tachycardic apical pulse checked 100.  She has a follow up appointment with Dr. Arnoldo Morale this month.  Pain Inventory Average Pain 7 Pain Right Now 6 My pain is sharp, burning, dull, stabbing, tingling and aching  In the last 24 hours, has pain interfered with the following? General activity 8 Relation with others 8 Enjoyment of life 8 What TIME of day is your pain at its worst? morning and evening Sleep (in general) Fair  Pain is worse with: walking, bending, sitting, inactivity, standing and some activites Pain improves with: rest, heat/ice, medication and TENS Relief from Meds: 6  Mobility how many minutes can you walk? 10-15 ability to climb steps?  yes do you drive?  yes  Function disabled: date disabled . I need assistance with the following:  meal prep, household duties and shopping  Neuro/Psych weakness numbness tremor tingling trouble walking spasms dizziness confusion depression anxiety  Prior Studies Any changes since last visit?  yes  Brain scan  Physicians involved in your care Any changes since last visit?  no   Family History  Problem Relation Age of Onset  . Diabetes type II Mother   . Colon polyps Mother   . Diverticulitis Mother   . Diabetes Mother   . Heart disease Mother   . Diabetes type II Brother   . Uterine cancer    . Ovarian cancer     History   Social History  . Marital  Status: Married    Spouse Name: N/A  . Number of Children: 2  . Years of Education: N/A   Occupational History  . Disability    Social History Main Topics  . Smoking status: Former Smoker    Types: Cigarettes    Quit date: 06/30/2011  . Smokeless tobacco: Never Used  . Alcohol Use: No  . Drug Use: No  . Sexual Activity: Yes   Other Topics Concern  . None   Social History Narrative   Past Surgical History  Procedure Laterality Date  . Cesarean section    . Partial hysterectomy    . Tonsillectomy    . Left elbow surgery     Past Medical History  Diagnosis Date  . Fibromyalgia   . Anxiety   . Depression   . Degenerative disc disease     Spinal, small syrinx  . Chronic pain     Dr. Letta Pate  . GERD (gastroesophageal reflux disease)   . Esophageal stricture     Status post dilatation  . Syncope     Negative tilt table test  . Palpitations     No documented arrhythmias  . Hiatal hernia   . Internal hemorrhoids without mention of complication   . Personal history of colonic polyps 10/04/2000    HYPERPLASTIC POLYP  . Irritable bowel syndrome   . POTS (postural orthostatic tachycardia syndrome)    BP 126/70 mmHg  Pulse 109  Resp  14  SpO2 97%  Opioid Risk Score:   Fall Risk Score: Low Fall Risk (0-5 points)`1  Depression screen PHQ 2/9  Depression screen PHQ 2/9 10/22/2014  Decreased Interest 2  Down, Depressed, Hopeless 3  PHQ - 2 Score 5  Altered sleeping 2  Tired, decreased energy 3  Change in appetite 0  Feeling bad or failure about yourself  2  Trouble concentrating 1  Moving slowly or fidgety/restless 2  Suicidal thoughts 0  PHQ-9 Score 15     Review of Systems  Musculoskeletal:       Spasms  Neurological: Positive for dizziness, tremors, weakness and numbness.       Tingling  Psychiatric/Behavioral: Positive for confusion and dysphoric mood. The patient is nervous/anxious.   All other systems reviewed and are negative.      Objective:     Physical Exam  Constitutional: She is oriented to person, place, and time. She appears well-developed and well-nourished.  HENT:  Head: Normocephalic and atraumatic.  Neck: Normal range of motion. Neck supple.  Cardiovascular: Normal rate and regular rhythm.   Pulmonary/Chest: Effort normal and breath sounds normal.  Musculoskeletal:  Normal Muscle Bulk and Muscle Testing Reveals: Upper Extremities: Full ROM and Muscle Strength 5/5 Back without spinal or paraspinal tenderness Lower Extremities: Full ROM and Muscle Strength 5/5 Arises from chair with ease Narrow Based Gait   Neurological: She is alert and oriented to person, place, and time.  Skin: Skin is warm and dry.  Psychiatric: She has a normal mood and affect.  Nursing note and vitals reviewed.         Assessment & Plan:  1. 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  3. Fibromyalgia: Continue with Heat and exercise therapy.  4.Depression and anxiety: Psychiatry Following  5. Orthostatic hypotension: .Cardiology Following  6. Chronic Headaches: RX: Topamax 25 mg BID  20 minutes of face to face patient care time was spent during this visit. All questions were encouraged and answered.

## 2015-01-22 ENCOUNTER — Encounter: Payer: Medicare Other | Attending: Physical Medicine & Rehabilitation | Admitting: Registered Nurse

## 2015-01-22 ENCOUNTER — Encounter: Payer: Self-pay | Admitting: Registered Nurse

## 2015-01-22 VITALS — BP 115/73 | HR 89 | Resp 16

## 2015-01-22 DIAGNOSIS — K222 Esophageal obstruction: Secondary | ICD-10-CM | POA: Diagnosis not present

## 2015-01-22 DIAGNOSIS — K589 Irritable bowel syndrome without diarrhea: Secondary | ICD-10-CM | POA: Insufficient documentation

## 2015-01-22 DIAGNOSIS — M609 Myositis, unspecified: Secondary | ICD-10-CM

## 2015-01-22 DIAGNOSIS — I951 Orthostatic hypotension: Secondary | ICD-10-CM | POA: Insufficient documentation

## 2015-01-22 DIAGNOSIS — K219 Gastro-esophageal reflux disease without esophagitis: Secondary | ICD-10-CM | POA: Diagnosis not present

## 2015-01-22 DIAGNOSIS — M47812 Spondylosis without myelopathy or radiculopathy, cervical region: Secondary | ICD-10-CM | POA: Diagnosis not present

## 2015-01-22 DIAGNOSIS — R51 Headache: Secondary | ICD-10-CM | POA: Diagnosis not present

## 2015-01-22 DIAGNOSIS — M791 Myalgia: Secondary | ICD-10-CM

## 2015-01-22 DIAGNOSIS — M542 Cervicalgia: Secondary | ICD-10-CM | POA: Diagnosis not present

## 2015-01-22 DIAGNOSIS — F329 Major depressive disorder, single episode, unspecified: Secondary | ICD-10-CM | POA: Diagnosis not present

## 2015-01-22 DIAGNOSIS — F419 Anxiety disorder, unspecified: Secondary | ICD-10-CM | POA: Diagnosis not present

## 2015-01-22 DIAGNOSIS — M545 Low back pain: Secondary | ICD-10-CM | POA: Insufficient documentation

## 2015-01-22 DIAGNOSIS — M797 Fibromyalgia: Secondary | ICD-10-CM | POA: Insufficient documentation

## 2015-01-22 DIAGNOSIS — Z86010 Personal history of colonic polyps: Secondary | ICD-10-CM | POA: Insufficient documentation

## 2015-01-22 DIAGNOSIS — M47892 Other spondylosis, cervical region: Secondary | ICD-10-CM | POA: Insufficient documentation

## 2015-01-22 DIAGNOSIS — Z599 Problem related to housing and economic circumstances, unspecified: Secondary | ICD-10-CM | POA: Diagnosis not present

## 2015-01-22 DIAGNOSIS — G8929 Other chronic pain: Secondary | ICD-10-CM | POA: Diagnosis not present

## 2015-01-22 DIAGNOSIS — Z5181 Encounter for therapeutic drug level monitoring: Secondary | ICD-10-CM

## 2015-01-22 DIAGNOSIS — G44229 Chronic tension-type headache, not intractable: Secondary | ICD-10-CM | POA: Diagnosis not present

## 2015-01-22 DIAGNOSIS — K449 Diaphragmatic hernia without obstruction or gangrene: Secondary | ICD-10-CM | POA: Insufficient documentation

## 2015-01-22 DIAGNOSIS — IMO0001 Reserved for inherently not codable concepts without codable children: Secondary | ICD-10-CM

## 2015-01-22 DIAGNOSIS — R002 Palpitations: Secondary | ICD-10-CM | POA: Insufficient documentation

## 2015-01-22 DIAGNOSIS — K088 Other specified disorders of teeth and supporting structures: Secondary | ICD-10-CM | POA: Diagnosis not present

## 2015-01-22 DIAGNOSIS — R55 Syncope and collapse: Secondary | ICD-10-CM | POA: Diagnosis not present

## 2015-01-22 DIAGNOSIS — M47817 Spondylosis without myelopathy or radiculopathy, lumbosacral region: Secondary | ICD-10-CM

## 2015-01-22 DIAGNOSIS — K648 Other hemorrhoids: Secondary | ICD-10-CM | POA: Diagnosis not present

## 2015-01-22 DIAGNOSIS — Z79899 Other long term (current) drug therapy: Secondary | ICD-10-CM

## 2015-01-22 DIAGNOSIS — R000 Tachycardia, unspecified: Secondary | ICD-10-CM | POA: Diagnosis not present

## 2015-01-22 DIAGNOSIS — Z760 Encounter for issue of repeat prescription: Secondary | ICD-10-CM | POA: Insufficient documentation

## 2015-01-22 DIAGNOSIS — M5136 Other intervertebral disc degeneration, lumbar region: Secondary | ICD-10-CM | POA: Diagnosis not present

## 2015-01-22 DIAGNOSIS — Z87891 Personal history of nicotine dependence: Secondary | ICD-10-CM | POA: Insufficient documentation

## 2015-01-22 MED ORDER — HYDROCODONE-ACETAMINOPHEN 10-325 MG PO TABS: 2.0000 | ORAL_TABLET | Freq: Three times a day (TID) | ORAL | Status: DC | PRN

## 2015-01-22 NOTE — Progress Notes (Signed)
Subjective:    Patient ID: [name], female    DOB: [birth year], 39 y.o.   MRN: [id number]  HPI: [name] is a 39 year old female who returns for follow up for chronic pain and medication refill. She says her pain is located in her neck, right shoulder and mid-lower back. She rates her pain 6. Her current exercise regime is walking.  Pain Inventory Average Pain 7 Pain Right Now 6 My pain is sharp, burning, dull, stabbing, tingling and aching  In the last 24 hours, has pain interfered with the following? General activity 7 Relation with others 8 Enjoyment of life 8 What TIME of day is your pain at its worst? morning and evening Sleep (in general) Fair  Pain is worse with: walking, bending, sitting, inactivity, standing, unsure and some activites Pain improves with: rest, heat/ice, medication and TENS Relief from Meds: 7  Mobility walk without assistance how many minutes can you walk? 15 ability to climb steps?  yes do you drive?  yes  Function disabled: date disabled . I need assistance with the following:  meal prep, household duties and shopping  Neuro/Psych weakness numbness tremor tingling trouble walking spasms dizziness confusion depression anxiety  Prior Studies Any changes since last visit?  no  Physicians involved in your care Any changes since last visit?  no   Family History  Problem Relation Age of Onset  . Diabetes type II Mother   . Colon polyps Mother   . Diverticulitis Mother   . Diabetes Mother   . Heart disease Mother   . Diabetes type II Brother   . Uterine cancer    . Ovarian cancer     History   Social History  . Marital Status: Married    Spouse Name: N/A  . Number of Children: 2  . Years of Education: N/A   Occupational History  . Disability    Social History Main Topics  . Smoking status: Former Smoker    Types: Cigarettes    Quit date: 06/30/2011  . Smokeless tobacco: Never Used  . Alcohol Use:  No  . Drug Use: No  . Sexual Activity: Yes   Other Topics Concern  . None   Social History Narrative   Past Surgical History  Procedure Laterality Date  . Cesarean section    . Partial hysterectomy    . Tonsillectomy    . Left elbow surgery     Past Medical History  Diagnosis Date  . Fibromyalgia   . Anxiety   . Depression   . Degenerative disc disease     Spinal, small syrinx  . Chronic pain     Dr. Letta Pate  . GERD (gastroesophageal reflux disease)   . Esophageal stricture     Status post dilatation  . Syncope     Negative tilt table test  . Palpitations     No documented arrhythmias  . Hiatal hernia   . Internal hemorrhoids without mention of complication   . Personal history of colonic polyps 10/04/2000    HYPERPLASTIC POLYP  . Irritable bowel syndrome   . POTS (postural orthostatic tachycardia syndrome)    BP 115/73 mmHg  Pulse 89  Resp 16  SpO2 98%  Opioid Risk Score:   Fall Risk Score: Moderate Fall Risk (6-13 points) (previously educated and given handout)`1  Depression screen PHQ 2/9  Depression screen PHQ 2/9 10/22/2014  Decreased Interest 2  Down, Depressed, Hopeless 3  PHQ - 2  Score 5  Altered sleeping 2  Tired, decreased energy 3  Change in appetite 0  Feeling bad or failure about yourself  2  Trouble concentrating 1  Moving slowly or fidgety/restless 2  Suicidal thoughts 0  PHQ-9 Score 15    Review of Systems  Musculoskeletal: Positive for back pain, gait problem and neck pain.       Spasms  Neurological: Positive for dizziness, tremors, weakness and numbness.       Tingling  Psychiatric/Behavioral: Positive for confusion and dysphoric mood. The patient is nervous/anxious.   All other systems reviewed and are negative.      Objective:   Physical Exam  Constitutional: She is oriented to person, place, and time. She appears well-developed and well-nourished.  HENT:  Head: Normocephalic and atraumatic.  Neck: Normal range of  motion. Neck supple.  Cardiovascular: Normal rate and regular rhythm.   Pulmonary/Chest: Effort normal and breath sounds normal.  Musculoskeletal:  Normal Muscle Bulk and Muscle Testing Reveals: Upper Extremities: Full ROM and Muscle Strength 5/5 Thoracic Hypersensitivity Lumbar Paraspinal Tenderness: L-3- L-5 Lower Extremities: Full ROM and Muscle Strength 5/5 Arises from chair with ease Narrow Based gait  Neurological: She is alert and oriented to person, place, and time.  Skin: Skin is warm and dry.  Psychiatric: She has a normal mood and affect.  Nursing note and vitals reviewed.         Assessment & Plan:  1. 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  3. Fibromyalgia: Continue with Heat and exercise therapy.  4.Depression and anxiety: Psychiatry Following  5. Orthostatic hypotension: .Cardiology Following  6. Chronic Headaches: Continue Topamax 25 mg BID  20 minutes of face to face patient care time was spent during this visit. All questions were encouraged and answered.

## 2015-02-22 ENCOUNTER — Encounter: Payer: Self-pay | Admitting: Registered Nurse

## 2015-02-22 ENCOUNTER — Encounter: Payer: Medicare Other | Attending: Physical Medicine & Rehabilitation | Admitting: Registered Nurse

## 2015-02-22 VITALS — BP 91/75 | HR 112 | Resp 16

## 2015-02-22 DIAGNOSIS — K088 Other specified disorders of teeth and supporting structures: Secondary | ICD-10-CM | POA: Insufficient documentation

## 2015-02-22 DIAGNOSIS — K449 Diaphragmatic hernia without obstruction or gangrene: Secondary | ICD-10-CM | POA: Insufficient documentation

## 2015-02-22 DIAGNOSIS — M47892 Other spondylosis, cervical region: Secondary | ICD-10-CM | POA: Diagnosis not present

## 2015-02-22 DIAGNOSIS — F329 Major depressive disorder, single episode, unspecified: Secondary | ICD-10-CM | POA: Diagnosis not present

## 2015-02-22 DIAGNOSIS — K222 Esophageal obstruction: Secondary | ICD-10-CM | POA: Insufficient documentation

## 2015-02-22 DIAGNOSIS — Z87891 Personal history of nicotine dependence: Secondary | ICD-10-CM | POA: Diagnosis not present

## 2015-02-22 DIAGNOSIS — R55 Syncope and collapse: Secondary | ICD-10-CM | POA: Diagnosis not present

## 2015-02-22 DIAGNOSIS — R51 Headache: Secondary | ICD-10-CM | POA: Diagnosis not present

## 2015-02-22 DIAGNOSIS — M791 Myalgia: Secondary | ICD-10-CM

## 2015-02-22 DIAGNOSIS — K219 Gastro-esophageal reflux disease without esophagitis: Secondary | ICD-10-CM | POA: Insufficient documentation

## 2015-02-22 DIAGNOSIS — Z599 Problem related to housing and economic circumstances, unspecified: Secondary | ICD-10-CM | POA: Insufficient documentation

## 2015-02-22 DIAGNOSIS — G8929 Other chronic pain: Secondary | ICD-10-CM | POA: Diagnosis not present

## 2015-02-22 DIAGNOSIS — G44229 Chronic tension-type headache, not intractable: Secondary | ICD-10-CM

## 2015-02-22 DIAGNOSIS — Z760 Encounter for issue of repeat prescription: Secondary | ICD-10-CM | POA: Insufficient documentation

## 2015-02-22 DIAGNOSIS — M5136 Other intervertebral disc degeneration, lumbar region: Secondary | ICD-10-CM | POA: Diagnosis not present

## 2015-02-22 DIAGNOSIS — M545 Low back pain: Secondary | ICD-10-CM | POA: Insufficient documentation

## 2015-02-22 DIAGNOSIS — R000 Tachycardia, unspecified: Secondary | ICD-10-CM | POA: Insufficient documentation

## 2015-02-22 DIAGNOSIS — M542 Cervicalgia: Secondary | ICD-10-CM | POA: Insufficient documentation

## 2015-02-22 DIAGNOSIS — IMO0001 Reserved for inherently not codable concepts without codable children: Secondary | ICD-10-CM

## 2015-02-22 DIAGNOSIS — M47812 Spondylosis without myelopathy or radiculopathy, cervical region: Secondary | ICD-10-CM | POA: Diagnosis not present

## 2015-02-22 DIAGNOSIS — K589 Irritable bowel syndrome without diarrhea: Secondary | ICD-10-CM | POA: Insufficient documentation

## 2015-02-22 DIAGNOSIS — Z79899 Other long term (current) drug therapy: Secondary | ICD-10-CM

## 2015-02-22 DIAGNOSIS — K648 Other hemorrhoids: Secondary | ICD-10-CM | POA: Insufficient documentation

## 2015-02-22 DIAGNOSIS — Z5181 Encounter for therapeutic drug level monitoring: Secondary | ICD-10-CM

## 2015-02-22 DIAGNOSIS — M797 Fibromyalgia: Secondary | ICD-10-CM

## 2015-02-22 DIAGNOSIS — Z86010 Personal history of colonic polyps: Secondary | ICD-10-CM | POA: Insufficient documentation

## 2015-02-22 DIAGNOSIS — R002 Palpitations: Secondary | ICD-10-CM | POA: Diagnosis not present

## 2015-02-22 DIAGNOSIS — M609 Myositis, unspecified: Secondary | ICD-10-CM

## 2015-02-22 DIAGNOSIS — F419 Anxiety disorder, unspecified: Secondary | ICD-10-CM | POA: Diagnosis not present

## 2015-02-22 DIAGNOSIS — I951 Orthostatic hypotension: Secondary | ICD-10-CM | POA: Insufficient documentation

## 2015-02-22 MED ORDER — HYDROCODONE-ACETAMINOPHEN 10-325 MG PO TABS: 2.0000 | ORAL_TABLET | Freq: Three times a day (TID) | ORAL | Status: DC | PRN

## 2015-02-22 NOTE — Progress Notes (Signed)
Subjective:    Patient ID: [name], female    DOB: [birth year], 39 y.o.   MRN: [id number]  HPI: [name] is a 39 year old female who returns for follow up for chronic pain and medication refill. She says her pain is located in her neck, right shoulder, right elbow and lower back. Also complaining of headache , she states she arose with headache related to stressful conversation with her mother last night. Tearful emotional support given. She rates her pain 6. Her current exercise regime is walking. [name] complaining of neck swelling and difficulty swallowing. Hyoid bone protruding, thyroid and cricoid cartilage with swelling noted. Had [name] call  [facility] her PCP  spoke with Sabrina Kitchen PA, she has an appointment with Dr. Gerarda Shea 03/28/15 at 1:00 pm. Mrs. Frerking verbalizes understanding.  Pain Inventory Average Pain 7 Pain Right Now 6 My pain is intermittent, sharp, burning, dull, stabbing, tingling and aching  In the last 24 hours, has pain interfered with the following? General activity 8 Relation with others 8 Enjoyment of life 9 What TIME of day is your pain at its worst? morning and evening Sleep (in general) NA  Pain is worse with: walking, bending, sitting, inactivity, standing and some activites Pain improves with: rest, heat/ice, therapy/exercise, medication and TENS Relief from Meds: 6  Mobility how many minutes can you walk? 15 ability to climb steps?  yes do you drive?  yes  Function disabled: date disabled .  Neuro/Psych weakness numbness tremor tingling spasms dizziness confusion depression anxiety  Prior Studies Any changes since last visit?  no  Physicians involved in your care Any changes since last visit?  no   Family History  Problem Relation Age of Onset  . Diabetes type II Mother   . Colon polyps Mother   . Diverticulitis Mother   . Diabetes Mother   . Heart disease Mother   . Diabetes type  II Brother   . Uterine cancer    . Ovarian cancer     History   Social History  . Marital Status: Married    Spouse Name: N/A  . Number of Children: 2  . Years of Education: N/A   Occupational History  . Disability    Social History Main Topics  . Smoking status: Former Smoker    Types: Cigarettes    Quit date: 06/30/2011  . Smokeless tobacco: Never Used  . Alcohol Use: No  . Drug Use: No  . Sexual Activity: Yes   Other Topics Concern  . None   Social History Narrative   Past Surgical History  Procedure Laterality Date  . Cesarean section    . Partial hysterectomy    . Tonsillectomy    . Left elbow surgery     Past Medical History  Diagnosis Date  . Fibromyalgia   . Anxiety   . Depression   . Degenerative disc disease     Spinal, small syrinx  . Chronic pain     Dr. Letta Pate  . GERD (gastroesophageal reflux disease)   . Esophageal stricture     Status post dilatation  . Syncope     Negative tilt table test  . Palpitations     No documented arrhythmias  . Hiatal hernia   . Internal hemorrhoids without mention of complication   . Personal history of colonic polyps 10/04/2000    HYPERPLASTIC POLYP  . Irritable bowel syndrome   . POTS (postural orthostatic tachycardia syndrome)  BP 91/75 mmHg  Pulse 112  Resp 16  SpO2 100%  Opioid Risk Score:   Fall Risk Score:  `1  Depression screen PHQ 2/9  Depression screen [facility] 2/9 02/22/2015 10/22/2014  Decreased Interest 1 2  Down, Depressed, Hopeless 1 3  PHQ - 2 Score 2 5  Altered sleeping - 2  Tired, decreased energy - 3  Change in appetite - 0  Feeling bad or failure about yourself  - 2  Trouble concentrating - 1  Moving slowly or fidgety/restless - 2  Suicidal thoughts - 0  PHQ-9 Score - 15    Review of Systems  Musculoskeletal:       Spasms  Neurological: Positive for dizziness, tremors, weakness and numbness.       Tingling  Psychiatric/Behavioral: Positive for confusion and dysphoric  mood. The patient is nervous/anxious.   All other systems reviewed and are negative.      Objective:   Physical Exam  Constitutional: She is oriented to person, place, and time. She appears well-developed and well-nourished.  HENT:  Head: Normocephalic and atraumatic.  Neck: Normal range of motion. Neck supple.  Cervical Paraspinal Tenderness: C-5- C-6  Cardiovascular: Normal rate and regular rhythm.   Pulmonary/Chest: Effort normal and breath sounds normal.  Musculoskeletal:  Normal Muscle Bulk and Muscle Testing Reveals: Upper Extremities: Full ROM and Muscle Strength 5/5 Thoracic Paraspinal Tenderness: T-11- T-12 Mainly Left Side Lower Extremities: Full ROM and Muscle Strength 5/5 Arises from chair with ease Narrow Based Gait   Neurological: She is alert and oriented to person, place, and time.  Skin: Skin is warm and dry.  Psychiatric: She has a normal mood and affect.  Nursing note and vitals reviewed.         Assessment & Plan:  1. 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  3. Fibromyalgia: Continue with Heat and exercise therapy.  4.Depression and anxiety: Psychiatry Following  5. Orthostatic hypotension: .Cardiology Following  6. Chronic Headaches: Continue Topamax 25 mg BID  45 minutes of face to face patient care time was spent during this visit. All questions were encouraged and answered.

## 2015-03-21 ENCOUNTER — Encounter: Payer: Medicare Other | Admitting: Registered Nurse

## 2015-03-26 ENCOUNTER — Other Ambulatory Visit: Payer: Self-pay | Admitting: Physical Medicine & Rehabilitation

## 2015-03-26 ENCOUNTER — Encounter: Payer: Self-pay | Admitting: Registered Nurse

## 2015-03-26 ENCOUNTER — Encounter: Payer: Medicare Other | Attending: Physical Medicine & Rehabilitation | Admitting: Registered Nurse

## 2015-03-26 VITALS — BP 108/72 | HR 81

## 2015-03-26 DIAGNOSIS — G8929 Other chronic pain: Secondary | ICD-10-CM | POA: Insufficient documentation

## 2015-03-26 DIAGNOSIS — R55 Syncope and collapse: Secondary | ICD-10-CM | POA: Insufficient documentation

## 2015-03-26 DIAGNOSIS — I951 Orthostatic hypotension: Secondary | ICD-10-CM | POA: Diagnosis not present

## 2015-03-26 DIAGNOSIS — M542 Cervicalgia: Secondary | ICD-10-CM | POA: Insufficient documentation

## 2015-03-26 DIAGNOSIS — K088 Other specified disorders of teeth and supporting structures: Secondary | ICD-10-CM | POA: Insufficient documentation

## 2015-03-26 DIAGNOSIS — Z760 Encounter for issue of repeat prescription: Secondary | ICD-10-CM | POA: Diagnosis present

## 2015-03-26 DIAGNOSIS — K648 Other hemorrhoids: Secondary | ICD-10-CM | POA: Insufficient documentation

## 2015-03-26 DIAGNOSIS — Z87891 Personal history of nicotine dependence: Secondary | ICD-10-CM | POA: Diagnosis not present

## 2015-03-26 DIAGNOSIS — R51 Headache: Secondary | ICD-10-CM | POA: Insufficient documentation

## 2015-03-26 DIAGNOSIS — M609 Myositis, unspecified: Secondary | ICD-10-CM

## 2015-03-26 DIAGNOSIS — M47817 Spondylosis without myelopathy or radiculopathy, lumbosacral region: Secondary | ICD-10-CM

## 2015-03-26 DIAGNOSIS — R000 Tachycardia, unspecified: Secondary | ICD-10-CM | POA: Diagnosis not present

## 2015-03-26 DIAGNOSIS — M797 Fibromyalgia: Secondary | ICD-10-CM | POA: Diagnosis not present

## 2015-03-26 DIAGNOSIS — M5136 Other intervertebral disc degeneration, lumbar region: Secondary | ICD-10-CM | POA: Diagnosis not present

## 2015-03-26 DIAGNOSIS — Z599 Problem related to housing and economic circumstances, unspecified: Secondary | ICD-10-CM | POA: Insufficient documentation

## 2015-03-26 DIAGNOSIS — F329 Major depressive disorder, single episode, unspecified: Secondary | ICD-10-CM | POA: Diagnosis not present

## 2015-03-26 DIAGNOSIS — K219 Gastro-esophageal reflux disease without esophagitis: Secondary | ICD-10-CM | POA: Diagnosis not present

## 2015-03-26 DIAGNOSIS — F419 Anxiety disorder, unspecified: Secondary | ICD-10-CM | POA: Insufficient documentation

## 2015-03-26 DIAGNOSIS — M791 Myalgia: Secondary | ICD-10-CM

## 2015-03-26 DIAGNOSIS — M545 Low back pain: Secondary | ICD-10-CM | POA: Diagnosis not present

## 2015-03-26 DIAGNOSIS — Z79899 Other long term (current) drug therapy: Secondary | ICD-10-CM

## 2015-03-26 DIAGNOSIS — K222 Esophageal obstruction: Secondary | ICD-10-CM | POA: Insufficient documentation

## 2015-03-26 DIAGNOSIS — K449 Diaphragmatic hernia without obstruction or gangrene: Secondary | ICD-10-CM | POA: Diagnosis not present

## 2015-03-26 DIAGNOSIS — K589 Irritable bowel syndrome without diarrhea: Secondary | ICD-10-CM | POA: Diagnosis not present

## 2015-03-26 DIAGNOSIS — M47892 Other spondylosis, cervical region: Secondary | ICD-10-CM | POA: Diagnosis not present

## 2015-03-26 DIAGNOSIS — R002 Palpitations: Secondary | ICD-10-CM | POA: Diagnosis not present

## 2015-03-26 DIAGNOSIS — G44229 Chronic tension-type headache, not intractable: Secondary | ICD-10-CM

## 2015-03-26 DIAGNOSIS — Z86010 Personal history of colonic polyps: Secondary | ICD-10-CM | POA: Diagnosis not present

## 2015-03-26 DIAGNOSIS — Z5181 Encounter for therapeutic drug level monitoring: Secondary | ICD-10-CM

## 2015-03-26 DIAGNOSIS — IMO0001 Reserved for inherently not codable concepts without codable children: Secondary | ICD-10-CM

## 2015-03-26 MED ORDER — HYDROCODONE-ACETAMINOPHEN 10-325 MG PO TABS: 2.0000 | ORAL_TABLET | Freq: Three times a day (TID) | ORAL | Status: DC | PRN

## 2015-03-26 NOTE — Progress Notes (Signed)
Subjective:    Patient ID: [name], female    DOB: [birth year], 39 y.o.   MRN: [id number]  HPI: [name] is a 38 year old female who returns for follow up for chronic pain and medication refill. She says her pain is located in her neck, right shoulder and mid-lower back. Also complaining of headache ,she states while she was driving "she felt like she was in a fog", she denies blurry vision. Also states two weeks ago her blood sugar dropped and she felt nauseated and clammy she checked her blood sugar during the episode her blood sugar's were in the 40's. She admits she hasn't eaten today, she was given something to eat vitals re-checked blood pressure 99/59 and heart rate 74 oxygen saturation 99%. I asked if she has food in her home she states" yes". She rested for 30 minutes and states she is feeling better.   She rates her pain 6. Her current exercise regime is walking. She was instructed to keep her  appointment with Dr. Gerarda Fraction 03/28/15 at 1:00 pm. [name] verbalizes understanding.  Pain Inventory Average Pain 7 Pain Right Now 5 My pain is sharp, burning, dull, stabbing, tingling, aching and other  In the last 24 hours, has pain interfered with the following? General activity 8 Relation with others 8 Enjoyment of life 8 What TIME of day is your pain at its worst? morning, evening Sleep (in general) na  Pain is worse with: walking, bending, sitting, inactivity, standing and some activites Pain improves with: rest, heat/ice, medication and TENS Relief from Meds: 6  Mobility walk without assistance ability to climb steps?  yes do you drive?  yes  Function disabled: date disabled na I need assistance with the following:  meal prep, household duties and shopping  Neuro/Psych weakness numbness tremor tingling trouble walking spasms dizziness confusion depression anxiety  Prior Studies Any changes since last visit?  no  Physicians involved in your  care Any changes since last visit?  no   Family History  Problem Relation Age of Onset  . Diabetes type II Mother   . Colon polyps Mother   . Diverticulitis Mother   . Diabetes Mother   . Heart disease Mother   . Diabetes type II Brother   . Uterine cancer    . Ovarian cancer     Social History   Social History  . Marital Status: Married    Spouse Name: N/A  . Number of Children: 2  . Years of Education: N/A   Occupational History  . Disability    Social History Main Topics  . Smoking status: Former Smoker    Types: Cigarettes    Quit date: 06/30/2011  . Smokeless tobacco: Never Used  . Alcohol Use: No  . Drug Use: No  . Sexual Activity: Yes   Other Topics Concern  . None   Social History Narrative   Past Surgical History  Procedure Laterality Date  . Cesarean section    . Partial hysterectomy    . Tonsillectomy    . Left elbow surgery     Past Medical History  Diagnosis Date  . Fibromyalgia   . Anxiety   . Depression   . Degenerative disc disease     Spinal, small syrinx  . Chronic pain     Dr. Letta Pate  . GERD (gastroesophageal reflux disease)   . Esophageal stricture     Status post dilatation  . Syncope  Negative tilt table test  . Palpitations     No documented arrhythmias  . Hiatal hernia   . Internal hemorrhoids without mention of complication   . Personal history of colonic polyps 10/04/2000    HYPERPLASTIC POLYP  . Irritable bowel syndrome   . POTS (postural orthostatic tachycardia syndrome)    BP 108/72 mmHg  Pulse 81  SpO2 100%  Opioid Risk Score:   Fall Risk Score:  `1  Depression screen PHQ 2/9  Depression screen [facility] 2/9 03/26/2015 02/22/2015 10/22/2014  Decreased Interest 1 1 2   Down, Depressed, Hopeless 1 1 3   PHQ - 2 Score 2 2 5   Altered sleeping - - 2  Tired, decreased energy - - 3  Change in appetite - - 0  Feeling bad or failure about yourself  - - 2  Trouble concentrating - - 1  Moving slowly or  fidgety/restless - - 2  Suicidal thoughts - - 0  PHQ-9 Score - - 15     Review of Systems  HENT:       Low blood sugar  All other systems reviewed and are negative.      Objective:   Physical Exam  Constitutional: She is oriented to person, place, and time. She appears well-developed and well-nourished.  HENT:  Head: Normocephalic and atraumatic.  Neck: Normal range of motion. Neck supple.  Cardiovascular: Normal rate and regular rhythm.   Pulmonary/Chest: Effort normal and breath sounds normal.  Musculoskeletal:  Normal Muscle Bulk and Muscle Testing Reveals: Upper Extremities: Full ROM and Muscle Strength 5/5 Back without spinal or paraspinal tenderness Lower Extremities: Full ROM and Muscle Strength 5/5 Arises from chair with ease Narrow Based Gait   Neurological: She is alert and oriented to person, place, and time.  Skin: Skin is warm and dry.  Psychiatric: She has a normal mood and affect.  Nursing note and vitals reviewed.         Assessment & Plan:  1. 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  3. Fibromyalgia: Continue with Heat and exercise therapy.  4.Depression and anxiety: Psychiatry Following  5. Orthostatic hypotension: .Cardiology Following  6. Chronic Headaches: Continue Topamax 25 mg BID  45 minutes of face to face patient care time was spent during this visit. All questions were encouraged and answered.

## 2015-03-27 LAB — PMP ALCOHOL METABOLITE (ETG)

## 2015-03-31 LAB — OPIATES/OPIOIDS (LC/MS-MS)
CODEINE URINE: NEGATIVE ng/mL (ref <50)
Hydrocodone: 4021 ng/mL (ref <50)
Hydromorphone: 287 ng/mL (ref <50)
MORPHINE: NEGATIVE ng/mL (ref <50)
Norhydrocodone, Ur: 5797 ng/mL (ref <50)
Noroxycodone, Ur: NEGATIVE ng/mL (ref <50)
Oxycodone, ur: NEGATIVE ng/mL (ref <50)
Oxymorphone: NEGATIVE ng/mL (ref <50)

## 2015-03-31 LAB — BENZODIAZEPINES (GC/LC/MS), URINE
Alprazolam metabolite (GC/LC/MS), ur confirm: NEGATIVE ng/mL (ref <25)
Clonazepam metabolite (GC/LC/MS), ur confirm: 2457 ng/mL (ref <25)
Flurazepam metabolite (GC/LC/MS), ur confirm: NEGATIVE ng/mL (ref <50)
Lorazepam (GC/LC/MS), ur confirm: NEGATIVE ng/mL (ref <50)
MIDAZOLAMU: NEGATIVE ng/mL (ref <50)
Nordiazepam (GC/LC/MS), ur confirm: NEGATIVE ng/mL (ref <50)
Oxazepam (GC/LC/MS), ur confirm: NEGATIVE ng/mL (ref <50)
TEMAZEPAMU: NEGATIVE ng/mL (ref <50)
Triazolam metabolite (GC/LC/MS), ur confirm: NEGATIVE ng/mL (ref <50)

## 2015-03-31 LAB — AMPHETAMINES (GC/LC/MS), URINE
AMPHETAMINE CONF, UR: 27924 ng/mL (ref <250)
Methamphetamine Quant, Ur: NEGATIVE ng/mL (ref <250)

## 2015-03-31 LAB — ETHYL GLUCURONIDE, URINE
ETHYL SULFATE (ETS): 2621 ng/mL — AB (ref <100)
Ethyl Glucuronide (EtG): 24966 ng/mL — ABNORMAL HIGH (ref <500)

## 2015-04-02 LAB — PRESCRIPTION MONITORING PROFILE (SOLSTAS)
BUPRENORPHINE, URINE: NEGATIVE ng/mL
Barbiturate Screen, Urine: NEGATIVE ng/mL
CANNABINOID SCRN UR: NEGATIVE ng/mL
CARISOPRODOL, URINE: NEGATIVE ng/mL
Cocaine Metabolites: NEGATIVE ng/mL
Creatinine, Urine: 244.95 mg/dL (ref >20.0)
ECSTASY: NEGATIVE ng/mL
FENTANYL URINE: NEGATIVE ng/mL
MEPERIDINE UR: NEGATIVE ng/mL
Methadone Screen, Urine: NEGATIVE ng/mL
Nitrites, Initial: NEGATIVE ug/mL
Oxycodone Screen, Ur: NEGATIVE ng/mL
Propoxyphene: NEGATIVE ng/mL
TAPENTADOLUR: NEGATIVE ng/mL
Tramadol Scrn, Ur: NEGATIVE ng/mL
ZOLPIDEM, URINE: NEGATIVE ng/mL
pH, Initial: 5.4 pH (ref 4.5–8.9)

## 2015-04-09 NOTE — Progress Notes (Signed)
Urine drug screen for this encounter is consistent for prescribed medication, however it is positve for alcohol too.  This is a first offense.

## 2015-04-17 ENCOUNTER — Ambulatory Visit (HOSPITAL_COMMUNITY)
Admission: RE | Admit: 2015-04-17 | Discharge: 2015-04-17 | Disposition: A | Discharge status: Home / Self Care | Payer: Medicare Other | Source: Ambulatory Visit | Attending: Internal Medicine | Admitting: Internal Medicine

## 2015-04-17 ENCOUNTER — Other Ambulatory Visit (HOSPITAL_COMMUNITY): Payer: Self-pay | Admitting: Internal Medicine

## 2015-04-17 ENCOUNTER — Other Ambulatory Visit (HOSPITAL_COMMUNITY): Payer: Medicare Other | Admitting: Internal Medicine

## 2015-04-17 DIAGNOSIS — R609 Edema, unspecified: Secondary | ICD-10-CM

## 2015-04-17 DIAGNOSIS — N6452 Nipple discharge: Secondary | ICD-10-CM

## 2015-04-17 DIAGNOSIS — R222 Localized swelling, mass and lump, trunk: Secondary | ICD-10-CM | POA: Diagnosis present

## 2015-04-23 ENCOUNTER — Encounter: Payer: Medicare Other | Attending: Physical Medicine & Rehabilitation | Admitting: Registered Nurse

## 2015-04-23 ENCOUNTER — Encounter (HOSPITAL_COMMUNITY): Payer: Medicare Other

## 2015-04-23 ENCOUNTER — Encounter: Payer: Self-pay | Admitting: Registered Nurse

## 2015-04-23 ENCOUNTER — Other Ambulatory Visit: Payer: Self-pay | Admitting: Physical Medicine & Rehabilitation

## 2015-04-23 VITALS — BP 102/67 | HR 81

## 2015-04-23 DIAGNOSIS — K088 Other specified disorders of teeth and supporting structures: Secondary | ICD-10-CM | POA: Diagnosis not present

## 2015-04-23 DIAGNOSIS — K589 Irritable bowel syndrome without diarrhea: Secondary | ICD-10-CM | POA: Diagnosis not present

## 2015-04-23 DIAGNOSIS — G894 Chronic pain syndrome: Secondary | ICD-10-CM | POA: Diagnosis not present

## 2015-04-23 DIAGNOSIS — K648 Other hemorrhoids: Secondary | ICD-10-CM | POA: Diagnosis not present

## 2015-04-23 DIAGNOSIS — M5136 Other intervertebral disc degeneration, lumbar region: Secondary | ICD-10-CM | POA: Diagnosis not present

## 2015-04-23 DIAGNOSIS — K449 Diaphragmatic hernia without obstruction or gangrene: Secondary | ICD-10-CM | POA: Insufficient documentation

## 2015-04-23 DIAGNOSIS — M47817 Spondylosis without myelopathy or radiculopathy, lumbosacral region: Secondary | ICD-10-CM | POA: Diagnosis not present

## 2015-04-23 DIAGNOSIS — Z86010 Personal history of colonic polyps: Secondary | ICD-10-CM | POA: Diagnosis not present

## 2015-04-23 DIAGNOSIS — G8929 Other chronic pain: Secondary | ICD-10-CM | POA: Diagnosis not present

## 2015-04-23 DIAGNOSIS — Z87891 Personal history of nicotine dependence: Secondary | ICD-10-CM | POA: Diagnosis not present

## 2015-04-23 DIAGNOSIS — M791 Myalgia: Secondary | ICD-10-CM

## 2015-04-23 DIAGNOSIS — Z760 Encounter for issue of repeat prescription: Secondary | ICD-10-CM | POA: Diagnosis not present

## 2015-04-23 DIAGNOSIS — M797 Fibromyalgia: Secondary | ICD-10-CM | POA: Diagnosis not present

## 2015-04-23 DIAGNOSIS — M545 Low back pain: Secondary | ICD-10-CM | POA: Diagnosis not present

## 2015-04-23 DIAGNOSIS — Z79899 Other long term (current) drug therapy: Secondary | ICD-10-CM

## 2015-04-23 DIAGNOSIS — Z599 Problem related to housing and economic circumstances, unspecified: Secondary | ICD-10-CM | POA: Insufficient documentation

## 2015-04-23 DIAGNOSIS — R51 Headache: Secondary | ICD-10-CM | POA: Diagnosis not present

## 2015-04-23 DIAGNOSIS — M542 Cervicalgia: Secondary | ICD-10-CM | POA: Diagnosis not present

## 2015-04-23 DIAGNOSIS — M47892 Other spondylosis, cervical region: Secondary | ICD-10-CM | POA: Insufficient documentation

## 2015-04-23 DIAGNOSIS — R002 Palpitations: Secondary | ICD-10-CM | POA: Insufficient documentation

## 2015-04-23 DIAGNOSIS — G950 Syringomyelia and syringobulbia: Secondary | ICD-10-CM

## 2015-04-23 DIAGNOSIS — IMO0001 Reserved for inherently not codable concepts without codable children: Secondary | ICD-10-CM

## 2015-04-23 DIAGNOSIS — K222 Esophageal obstruction: Secondary | ICD-10-CM | POA: Diagnosis not present

## 2015-04-23 DIAGNOSIS — R000 Tachycardia, unspecified: Secondary | ICD-10-CM | POA: Insufficient documentation

## 2015-04-23 DIAGNOSIS — F419 Anxiety disorder, unspecified: Secondary | ICD-10-CM | POA: Insufficient documentation

## 2015-04-23 DIAGNOSIS — F329 Major depressive disorder, single episode, unspecified: Secondary | ICD-10-CM | POA: Diagnosis not present

## 2015-04-23 DIAGNOSIS — K219 Gastro-esophageal reflux disease without esophagitis: Secondary | ICD-10-CM | POA: Insufficient documentation

## 2015-04-23 DIAGNOSIS — R55 Syncope and collapse: Secondary | ICD-10-CM | POA: Insufficient documentation

## 2015-04-23 DIAGNOSIS — G90A Postural orthostatic tachycardia syndrome (POTS): Secondary | ICD-10-CM

## 2015-04-23 DIAGNOSIS — G44229 Chronic tension-type headache, not intractable: Secondary | ICD-10-CM

## 2015-04-23 DIAGNOSIS — M609 Myositis, unspecified: Secondary | ICD-10-CM

## 2015-04-23 DIAGNOSIS — I951 Orthostatic hypotension: Secondary | ICD-10-CM | POA: Diagnosis not present

## 2015-04-23 DIAGNOSIS — Z5181 Encounter for therapeutic drug level monitoring: Secondary | ICD-10-CM

## 2015-04-23 MED ORDER — HYDROCODONE-ACETAMINOPHEN 10-325 MG PO TABS: 2.0000 | ORAL_TABLET | Freq: Three times a day (TID) | ORAL | Status: DC | PRN

## 2015-04-23 NOTE — Progress Notes (Signed)
Subjective:    Patient ID: [name], female    DOB: 06/02/1976, 39 y.o.   MRN: [id number]  HPI: [name] is a 39 year old female who returns for follow up for chronic pain and medication refill. She says her pain is located in her neck, right shoulder and mid-lower back. Also complaining of headache. She rates her pain 7. Her current exercise regime is walking. She went to see Dr. Gerarda Fraction she states he has referred her to Cardiology, Electrophysiologist  And Neurologist. [name] very tearful during the visit regarding lack of family support. Emotional support given.She has a psychiatrist and psychologist. Encouraged to attend group as recommended via Psychologist she verbalizes understanding. Also encouraged to attend outside activities as tolerated she verbalizes understanding.We discussed the + ETOH she states she had a glass wine, we reviewed the Narcotic Policy she verbalizes understanding.   Pain Inventory Average Pain 7 Pain Right Now 7 My pain is intermittent, sharp, burning, dull, stabbing, tingling, aching and other  In the last 24 hours, has pain interfered with the following? General activity 7 Relation with others 8 Enjoyment of life 8 What TIME of day is your pain at its worst? morning, evening, night Sleep (in general) NA  Pain is worse with: walking, bending, sitting, inactivity, standing, unsure and some activites Pain improves with: rest, heat/ice, medication and TENS Relief from Meds: 7  Mobility walk without assistance how many minutes can you walk? 15-20 ability to climb steps?  yes do you drive?  yes needs help with transfers Do you have any goals in this area?  yes  Function disabled: date disabled na I need assistance with the following:  meal prep, household duties and shopping  Neuro/Psych weakness numbness tremor tingling trouble walking spasms dizziness confusion depression anxiety  Prior  Studies x-rays bloodwork  Physicians involved in your care Primary care Dr. Gerarda Fraction   Family History  Problem Relation Age of Onset  . Diabetes type II Mother   . Colon polyps Mother   . Diverticulitis Mother   . Diabetes Mother   . Heart disease Mother   . Diabetes type II Brother   . Uterine cancer    . Ovarian cancer     Social History   Social History  . Marital Status: Married    Spouse Name: N/A  . Number of Children: 2  . Years of Education: N/A   Occupational History  . Disability    Social History Main Topics  . Smoking status: Former Smoker    Types: Cigarettes    Quit date: 06/30/2011  . Smokeless tobacco: Never Used  . Alcohol Use: No  . Drug Use: No  . Sexual Activity: Yes   Other Topics Concern  . None   Social History Narrative   Past Surgical History  Procedure Laterality Date  . Cesarean section    . Partial hysterectomy    . Tonsillectomy    . Left elbow surgery     Past Medical History  Diagnosis Date  . Fibromyalgia   . Anxiety   . Depression   . Degenerative disc disease     Spinal, small syrinx  . Chronic pain     Dr. Letta Pate  . GERD (gastroesophageal reflux disease)   . Esophageal stricture     Status post dilatation  . Syncope     Negative tilt table test  . Palpitations     No documented arrhythmias  . Hiatal hernia   .  Internal hemorrhoids without mention of complication   . Personal history of colonic polyps 10/04/2000    HYPERPLASTIC POLYP  . Irritable bowel syndrome   . POTS (postural orthostatic tachycardia syndrome)    BP 102/67 mmHg  Pulse 81  SpO2 99%  Opioid Risk Score:   Fall Risk Score:  `1  Depression screen PHQ 2/9  Depression screen [facility] 2/9 04/23/2015 03/26/2015 02/22/2015 10/22/2014  Decreased Interest 1 1 1 2   Down, Depressed, Hopeless 1 1 1 3   PHQ - 2 Score 2 2 2 5   Altered sleeping - - - 2  Tired, decreased energy - - - 3  Change in appetite - - - 0  Feeling bad or failure about yourself  -  - - 2  Trouble concentrating - - - 1  Moving slowly or fidgety/restless - - - 2  Suicidal thoughts - - - 0  PHQ-9 Score - - - 15     Review of Systems  Constitutional: Positive for chills.  Gastrointestinal: Positive for nausea, diarrhea and constipation.  All other systems reviewed and are negative.      Objective:   Physical Exam  Constitutional: She is oriented to person, place, and time. She appears well-developed and well-nourished.  HENT:  Head: Normocephalic and atraumatic.  Neck: Normal range of motion. Neck supple.  Cardiovascular: Normal rate and regular rhythm.   Pulmonary/Chest: Effort normal and breath sounds normal.  Musculoskeletal:  Normal Muscle bulk and Muscle Testing reveals: Upper Extremities: Full ROM and Muscle Strength 5/5 Back without spinal or paraspinal tenderness Lower Extremities: Full ROM and Muscle Strength 5/5 Arises from chair with ease Narrow Based gait  Neurological: She is alert and oriented to person, place, and time.  Skin: Skin is warm and dry.  Psychiatric: She has a normal mood and affect.  Nursing note and vitals reviewed.         Assessment & Plan:  1. 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  3. Fibromyalgia: Continue with Heat and exercise therapy.  4.Depression and anxiety: Psychiatry Following  5. Orthostatic hypotension: .Cardiology Following  6. Chronic Headaches: Continue Topamax 25 mg BID  30 minutes of face to face patient care time was spent during this visit. All questions were encouraged and answered.

## 2015-04-24 LAB — PMP ALCOHOL METABOLITE (ETG): Ethyl Glucuronide (EtG): NEGATIVE ng/mL

## 2015-04-28 LAB — OPIATES/OPIOIDS (LC/MS-MS)
Codeine Urine: NEGATIVE ng/mL (ref <50)
HYDROMORPHONE: NEGATIVE ng/mL — AB (ref <50)
Hydrocodone: 482 ng/mL (ref <50)
Morphine Urine: NEGATIVE ng/mL (ref <50)
NOROXYCODONE, UR: NEGATIVE ng/mL (ref <50)
Norhydrocodone, Ur: 1223 ng/mL (ref <50)
OXYCODONE, UR: NEGATIVE ng/mL (ref <50)
Oxymorphone: NEGATIVE ng/mL (ref <50)

## 2015-04-28 LAB — AMPHETAMINES (GC/LC/MS), URINE
AMPHETAMINE CONF, UR: 6766 ng/mL (ref <250)
METHAMPHETAMINE QUANT UR: NEGATIVE ng/mL (ref <250)

## 2015-04-28 LAB — BENZODIAZEPINES (GC/LC/MS), URINE
Alprazolam metabolite (GC/LC/MS), ur confirm: NEGATIVE ng/mL (ref <25)
CLONAZEPAU: 1174 ng/mL — AB (ref <25)
FLURAZEPAMU: NEGATIVE ng/mL (ref <50)
Lorazepam (GC/LC/MS), ur confirm: NEGATIVE ng/mL (ref <50)
Midazolam (GC/LC/MS), ur confirm: NEGATIVE ng/mL (ref <50)
Nordiazepam (GC/LC/MS), ur confirm: NEGATIVE ng/mL (ref <50)
Oxazepam (GC/LC/MS), ur confirm: NEGATIVE ng/mL (ref <50)
Temazepam (GC/LC/MS), ur confirm: NEGATIVE ng/mL (ref <50)
Triazolam metabolite (GC/LC/MS), ur confirm: NEGATIVE ng/mL (ref <50)

## 2015-04-30 ENCOUNTER — Encounter (HOSPITAL_COMMUNITY): Payer: Medicare Other

## 2015-04-30 LAB — PRESCRIPTION MONITORING PROFILE (SOLSTAS)
BARBITURATE SCREEN, URINE: NEGATIVE ng/mL
Buprenorphine, Urine: NEGATIVE ng/mL
Cannabinoid Scrn, Ur: NEGATIVE ng/mL
Carisoprodol, Urine: NEGATIVE ng/mL
Cocaine Metabolites: NEGATIVE ng/mL
Creatinine, Urine: 111.13 mg/dL (ref >20.0)
Fentanyl, Ur: NEGATIVE ng/mL
MDMA URINE: NEGATIVE ng/mL
Meperidine, Ur: NEGATIVE ng/mL
Methadone Screen, Urine: NEGATIVE ng/mL
NITRITES URINE, INITIAL: NEGATIVE ug/mL
OXYCODONE SCRN UR: NEGATIVE ng/mL
PH URINE, INITIAL: 6.5 pH (ref 4.5–8.9)
PROPOXYPHENE: NEGATIVE ng/mL
TAPENTADOLUR: NEGATIVE ng/mL
TRAMADOL UR: NEGATIVE ng/mL
Zolpidem, Urine: NEGATIVE ng/mL

## 2015-05-02 ENCOUNTER — Other Ambulatory Visit (HOSPITAL_COMMUNITY): Payer: Self-pay | Admitting: Internal Medicine

## 2015-05-02 DIAGNOSIS — G950 Syringomyelia and syringobulbia: Secondary | ICD-10-CM

## 2015-05-21 ENCOUNTER — Encounter: Payer: Medicare Other | Attending: Physical Medicine & Rehabilitation | Admitting: Registered Nurse

## 2015-05-21 ENCOUNTER — Ambulatory Visit (HOSPITAL_COMMUNITY): Payer: Medicare Other

## 2015-05-21 ENCOUNTER — Encounter: Payer: Self-pay | Admitting: Registered Nurse

## 2015-05-21 ENCOUNTER — Ambulatory Visit (HOSPITAL_COMMUNITY): Admission: RE | Admit: 2015-05-21 | Payer: Medicare Other | Source: Ambulatory Visit

## 2015-05-21 VITALS — BP 111/69 | HR 95 | Resp 15

## 2015-05-21 DIAGNOSIS — Z79899 Other long term (current) drug therapy: Secondary | ICD-10-CM

## 2015-05-21 DIAGNOSIS — G8929 Other chronic pain: Secondary | ICD-10-CM | POA: Diagnosis not present

## 2015-05-21 DIAGNOSIS — Z86010 Personal history of colonic polyps: Secondary | ICD-10-CM | POA: Diagnosis not present

## 2015-05-21 DIAGNOSIS — K648 Other hemorrhoids: Secondary | ICD-10-CM | POA: Insufficient documentation

## 2015-05-21 DIAGNOSIS — K222 Esophageal obstruction: Secondary | ICD-10-CM | POA: Diagnosis not present

## 2015-05-21 DIAGNOSIS — M545 Low back pain: Secondary | ICD-10-CM | POA: Insufficient documentation

## 2015-05-21 DIAGNOSIS — F419 Anxiety disorder, unspecified: Secondary | ICD-10-CM | POA: Insufficient documentation

## 2015-05-21 DIAGNOSIS — R51 Headache: Secondary | ICD-10-CM | POA: Diagnosis not present

## 2015-05-21 DIAGNOSIS — Z5181 Encounter for therapeutic drug level monitoring: Secondary | ICD-10-CM

## 2015-05-21 DIAGNOSIS — G894 Chronic pain syndrome: Secondary | ICD-10-CM | POA: Diagnosis not present

## 2015-05-21 DIAGNOSIS — M47892 Other spondylosis, cervical region: Secondary | ICD-10-CM | POA: Insufficient documentation

## 2015-05-21 DIAGNOSIS — M47817 Spondylosis without myelopathy or radiculopathy, lumbosacral region: Secondary | ICD-10-CM

## 2015-05-21 DIAGNOSIS — R000 Tachycardia, unspecified: Secondary | ICD-10-CM | POA: Insufficient documentation

## 2015-05-21 DIAGNOSIS — K0889 Other specified disorders of teeth and supporting structures: Secondary | ICD-10-CM | POA: Diagnosis not present

## 2015-05-21 DIAGNOSIS — Z760 Encounter for issue of repeat prescription: Secondary | ICD-10-CM | POA: Diagnosis not present

## 2015-05-21 DIAGNOSIS — Z87891 Personal history of nicotine dependence: Secondary | ICD-10-CM | POA: Diagnosis not present

## 2015-05-21 DIAGNOSIS — R002 Palpitations: Secondary | ICD-10-CM | POA: Diagnosis not present

## 2015-05-21 DIAGNOSIS — M609 Myositis, unspecified: Secondary | ICD-10-CM

## 2015-05-21 DIAGNOSIS — I951 Orthostatic hypotension: Secondary | ICD-10-CM | POA: Diagnosis not present

## 2015-05-21 DIAGNOSIS — M791 Myalgia: Secondary | ICD-10-CM | POA: Diagnosis not present

## 2015-05-21 DIAGNOSIS — G950 Syringomyelia and syringobulbia: Secondary | ICD-10-CM

## 2015-05-21 DIAGNOSIS — K449 Diaphragmatic hernia without obstruction or gangrene: Secondary | ICD-10-CM | POA: Diagnosis not present

## 2015-05-21 DIAGNOSIS — R55 Syncope and collapse: Secondary | ICD-10-CM | POA: Diagnosis not present

## 2015-05-21 DIAGNOSIS — M797 Fibromyalgia: Secondary | ICD-10-CM | POA: Diagnosis not present

## 2015-05-21 DIAGNOSIS — K589 Irritable bowel syndrome without diarrhea: Secondary | ICD-10-CM | POA: Diagnosis not present

## 2015-05-21 DIAGNOSIS — Z599 Problem related to housing and economic circumstances, unspecified: Secondary | ICD-10-CM | POA: Diagnosis not present

## 2015-05-21 DIAGNOSIS — M5136 Other intervertebral disc degeneration, lumbar region: Secondary | ICD-10-CM | POA: Diagnosis not present

## 2015-05-21 DIAGNOSIS — M542 Cervicalgia: Secondary | ICD-10-CM | POA: Insufficient documentation

## 2015-05-21 DIAGNOSIS — IMO0001 Reserved for inherently not codable concepts without codable children: Secondary | ICD-10-CM

## 2015-05-21 DIAGNOSIS — K219 Gastro-esophageal reflux disease without esophagitis: Secondary | ICD-10-CM | POA: Insufficient documentation

## 2015-05-21 DIAGNOSIS — F329 Major depressive disorder, single episode, unspecified: Secondary | ICD-10-CM | POA: Insufficient documentation

## 2015-05-21 MED ORDER — HYDROCODONE-ACETAMINOPHEN 10-325 MG PO TABS: 2.0000 | ORAL_TABLET | Freq: Three times a day (TID) | ORAL | Status: DC | PRN

## 2015-05-21 NOTE — Progress Notes (Signed)
Subjective:    Patient ID: [name], female    DOB: Nov 23, 1975, 39 y.o.   MRN: [id number]  HPI:  [name] is a 39 year old female who returns for follow up for chronic pain and medication refill. She says her pain is located in her mid-lower back. Also states she has been experiencing occasionally left thigh numbness and burning denies at this time. She rates her pain 5. Her current exercise regime is walking.   Pain Inventory Average Pain 8 Pain Right Now 5 My pain is intermittent, sharp, burning, dull, stabbing, tingling and aching  In the last 24 hours, has pain interfered with the following? General activity 7 Relation with others 8 Enjoyment of life 8 What TIME of day is your pain at its worst? Morning, Evening, Night Sleep (in general) NA  Pain is worse with: walking, bending, sitting, inactivity, standing and unsure Pain improves with: rest, heat/ice, medication and TENS Relief from Meds: 7  Mobility walk without assistance ability to climb steps?  yes do you drive?  yes needs help with transfers  Function disabled: date disabled NA I need assistance with the following:  meal prep, household duties and shopping  Neuro/Psych weakness numbness tremor tingling trouble walking spasms dizziness confusion depression anxiety  Prior Studies Any changes since last visit?  no  Physicians involved in your care Any changes since last visit?  no   Family History  Problem Relation Age of Onset  . Diabetes type II Mother   . Colon polyps Mother   . Diverticulitis Mother   . Diabetes Mother   . Heart disease Mother   . Diabetes type II Brother   . Uterine cancer    . Ovarian cancer     Social History   Social History  . Marital Status: Married    Spouse Name: N/A  . Number of Children: 2  . Years of Education: N/A   Occupational History  . Disability    Social History Main Topics  . Smoking status: Former Smoker    Types:  Cigarettes    Quit date: 06/30/2011  . Smokeless tobacco: Never Used  . Alcohol Use: No  . Drug Use: No  . Sexual Activity: Yes   Other Topics Concern  . None   Social History Narrative   Past Surgical History  Procedure Laterality Date  . Cesarean section    . Partial hysterectomy    . Tonsillectomy    . Left elbow surgery     Past Medical History  Diagnosis Date  . Fibromyalgia   . Anxiety   . Depression   . Degenerative disc disease     Spinal, small syrinx  . Chronic pain     Dr. Letta Pate  . GERD (gastroesophageal reflux disease)   . Esophageal stricture     Status post dilatation  . Syncope     Negative tilt table test  . Palpitations     No documented arrhythmias  . Hiatal hernia   . Internal hemorrhoids without mention of complication   . Personal history of colonic polyps 10/04/2000    HYPERPLASTIC POLYP  . Irritable bowel syndrome   . POTS (postural orthostatic tachycardia syndrome)    BP 111/69 mmHg  Pulse 95  Resp 15  SpO2 100%  Opioid Risk Score:   Fall Risk Score:  `1  Depression screen PHQ 2/9  Depression screen [facility] 2/9 04/23/2015 03/26/2015 02/22/2015 10/22/2014  Decreased Interest 1 1 1  2  Down, Depressed, Hopeless 1 1 1 3   PHQ - 2 Score 2 2 2 5   Altered sleeping - - - 2  Tired, decreased energy - - - 3  Change in appetite - - - 0  Feeling bad or failure about yourself  - - - 2  Trouble concentrating - - - 1  Moving slowly or fidgety/restless - - - 2  Suicidal thoughts - - - 0  PHQ-9 Score - - - 15     Review of Systems  Constitutional: Positive for chills and appetite change.  Respiratory: Positive for shortness of breath.   Gastrointestinal: Positive for nausea.  Neurological: Positive for dizziness, weakness and numbness.  Psychiatric/Behavioral: The patient is nervous/anxious.        Objective:   Physical Exam  Constitutional: She is oriented to person, place, and time. She appears well-developed and well-nourished.  HENT:    Head: Normocephalic and atraumatic.  Neck: Normal range of motion. Neck supple.  Cardiovascular: Normal rate and regular rhythm.   Pulmonary/Chest: Effort normal and breath sounds normal.  Musculoskeletal:  Normal Muscle Bulk and Muscle Testing Reveals: Upper Extremities: Full ROM and Muscle Strength 5/5 Back without spinal or paraspinal tenderness Lower Extremities: Full ROM and Muscle Strength 5/5 Arises from chair with ease Narrow Based Gait  Neurological: She is alert and oriented to person, place, and time.  Skin: Skin is warm and dry.  Psychiatric: She has a normal mood and affect.  Nursing note and vitals reviewed.         Assessment & Plan:  1. 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  3. Fibromyalgia: Continue with Heat and exercise therapy.  4.Depression and anxiety: Psychiatry Following  5. Orthostatic hypotension: .Cardiology Following  6. Chronic Headaches: Continue Topamax 25 mg BID  30 minutes of face to face patient care time was spent during this visit. All questions were encouraged and answered.

## 2015-05-30 NOTE — Progress Notes (Signed)
Urine drug screen for this encounter is consistent for prescribed medication 

## 2015-06-18 ENCOUNTER — Ambulatory Visit (HOSPITAL_BASED_OUTPATIENT_CLINIC_OR_DEPARTMENT_OTHER): Payer: Medicare Other | Admitting: Physical Medicine & Rehabilitation

## 2015-06-18 ENCOUNTER — Encounter: Payer: Self-pay | Admitting: Physical Medicine & Rehabilitation

## 2015-06-18 ENCOUNTER — Encounter: Payer: Medicare Other | Attending: Physical Medicine & Rehabilitation

## 2015-06-18 VITALS — BP 112/75 | HR 100 | Resp 14

## 2015-06-18 DIAGNOSIS — K222 Esophageal obstruction: Secondary | ICD-10-CM | POA: Insufficient documentation

## 2015-06-18 DIAGNOSIS — M47812 Spondylosis without myelopathy or radiculopathy, cervical region: Secondary | ICD-10-CM | POA: Diagnosis not present

## 2015-06-18 DIAGNOSIS — R002 Palpitations: Secondary | ICD-10-CM | POA: Insufficient documentation

## 2015-06-18 DIAGNOSIS — M519 Unspecified thoracic, thoracolumbar and lumbosacral intervertebral disc disorder: Secondary | ICD-10-CM

## 2015-06-18 DIAGNOSIS — R51 Headache: Secondary | ICD-10-CM | POA: Insufficient documentation

## 2015-06-18 DIAGNOSIS — R000 Tachycardia, unspecified: Secondary | ICD-10-CM

## 2015-06-18 DIAGNOSIS — G8929 Other chronic pain: Secondary | ICD-10-CM | POA: Insufficient documentation

## 2015-06-18 DIAGNOSIS — I951 Orthostatic hypotension: Secondary | ICD-10-CM | POA: Insufficient documentation

## 2015-06-18 DIAGNOSIS — K589 Irritable bowel syndrome without diarrhea: Secondary | ICD-10-CM | POA: Insufficient documentation

## 2015-06-18 DIAGNOSIS — Z86010 Personal history of colonic polyps: Secondary | ICD-10-CM | POA: Diagnosis not present

## 2015-06-18 DIAGNOSIS — K449 Diaphragmatic hernia without obstruction or gangrene: Secondary | ICD-10-CM | POA: Diagnosis not present

## 2015-06-18 DIAGNOSIS — M542 Cervicalgia: Secondary | ICD-10-CM | POA: Insufficient documentation

## 2015-06-18 DIAGNOSIS — M5136 Other intervertebral disc degeneration, lumbar region: Secondary | ICD-10-CM | POA: Diagnosis not present

## 2015-06-18 DIAGNOSIS — F329 Major depressive disorder, single episode, unspecified: Secondary | ICD-10-CM | POA: Diagnosis not present

## 2015-06-18 DIAGNOSIS — M545 Low back pain: Secondary | ICD-10-CM | POA: Diagnosis not present

## 2015-06-18 DIAGNOSIS — R55 Syncope and collapse: Secondary | ICD-10-CM | POA: Diagnosis not present

## 2015-06-18 DIAGNOSIS — K648 Other hemorrhoids: Secondary | ICD-10-CM | POA: Diagnosis not present

## 2015-06-18 DIAGNOSIS — M47892 Other spondylosis, cervical region: Secondary | ICD-10-CM | POA: Insufficient documentation

## 2015-06-18 DIAGNOSIS — Z599 Problem related to housing and economic circumstances, unspecified: Secondary | ICD-10-CM | POA: Diagnosis not present

## 2015-06-18 DIAGNOSIS — F419 Anxiety disorder, unspecified: Secondary | ICD-10-CM | POA: Diagnosis not present

## 2015-06-18 DIAGNOSIS — M4647 Discitis, unspecified, lumbosacral region: Secondary | ICD-10-CM | POA: Diagnosis not present

## 2015-06-18 DIAGNOSIS — K0889 Other specified disorders of teeth and supporting structures: Secondary | ICD-10-CM | POA: Diagnosis not present

## 2015-06-18 DIAGNOSIS — Z87891 Personal history of nicotine dependence: Secondary | ICD-10-CM | POA: Insufficient documentation

## 2015-06-18 DIAGNOSIS — K219 Gastro-esophageal reflux disease without esophagitis: Secondary | ICD-10-CM | POA: Diagnosis not present

## 2015-06-18 DIAGNOSIS — M797 Fibromyalgia: Secondary | ICD-10-CM | POA: Insufficient documentation

## 2015-06-18 DIAGNOSIS — Z760 Encounter for issue of repeat prescription: Secondary | ICD-10-CM | POA: Insufficient documentation

## 2015-06-18 DIAGNOSIS — G90A Postural orthostatic tachycardia syndrome (POTS): Secondary | ICD-10-CM

## 2015-06-18 MED ORDER — HYDROCODONE-ACETAMINOPHEN 10-325 MG PO TABS: 2.0000 | ORAL_TABLET | Freq: Three times a day (TID) | ORAL | Status: DC | PRN

## 2015-06-18 MED ORDER — DICLOFENAC SODIUM 1 % TD GEL: 2.0000 g | Freq: Four times a day (QID) | TRANSDERMAL | Status: DC

## 2015-06-18 NOTE — Progress Notes (Signed)
Subjective:    Patient ID: [name], female    DOB: [birth year], 39 y.o.   MRN: [id number]  HPI Mother having problem with diabetic foot infection with osteomyelitis, ARF with vancomycin.  Going to [facility] for second opinion for POTS Has had falls related to POTS  PCP has ordered multiple scans on her back. Patient thinks it might be her cervical thoracic and lumbar spine.  See below  Review of Systems  Musculoskeletal: Positive for gait problem.       Spasms  Neurological: Positive for weakness and numbness.       Tingling  Psychiatric/Behavioral: Positive for confusion and dysphoric mood. The patient is nervous/anxious.   All other systems reviewed and are negative.      Objective:   Physical Exam  Constitutional: She is oriented to person, place, and time. She appears well-developed and well-nourished.  HENT:  Head: Normocephalic and atraumatic.  Eyes: Conjunctivae and EOM are normal. Pupils are equal, round, and reactive to light.  Neck: Normal range of motion.  Neurological: She is alert and oriented to person, place, and time. She has normal strength. No sensory deficit. She exhibits normal muscle tone. Coordination and gait normal.  Motor strength is 5/5 bilateral deltoid, biceps, triceps, grip, hip flexor, knee extensor, ankle dorsi flexors and plantar flexor  Psychiatric: She has a normal mood and affect.  Nursing note and vitals reviewed.         Assessment & Plan:   1. Chronic pain syndrome. Multifactorial she has lumbar spondylosis lumbar degenerative disc. Also history of fibromyalgia. In addition she has anxiety and depression and this certainly is playing into it especially now when she is stressed out about her mother's health. Her primary care physician has made a referral to academic [facility] to reevaluate the small syrinx noted in the cervical and thoracic spine. I currently do not see any neurologic deficits which would be explained by the  syrinx. Has undergone neurosurgical and neurological evaluation locally.  We discussed that if she has most of her Medical care transferred to the academic [facility] she may also benefit from having her pain management transferred to that area. It would help with sharing of information and consultations. Patient is not sure whether she'll be going long-term to the academic [facility] or just go there for second opinion.  We'll schedule her for one month follow-up. Nurse practitioner visit M.D. Visit in 6-12 months       Pain Inventory Average Pain 7 Pain Right Now 6 My pain is intermittent, sharp, burning, dull, stabbing, tingling and aching  In the last 24 hours, has pain interfered with the following? General activity 7 Relation with others 7 Enjoyment of life 8 What TIME of day is your pain at its worst? morning, evening, night  Sleep (in general) Fair  Pain is worse with: walking, bending, sitting, standing and unsure Pain improves with: rest, heat/ice, medication and TENS Relief from Meds: 6  Mobility walk without assistance how many minutes can you walk? 10-20 ability to climb steps?  yes do you drive?  yes needs help with transfers  Function disabled: date disabled . I need assistance with the following:  meal prep, household duties and shopping  Neuro/Psych weakness numbness tremor tingling trouble walking spasms dizziness confusion depression anxiety  Prior Studies Any changes since last visit?  no MRI OF THORACIC SPINE WITHOUT CONTRAST:   Technique: Multiplanar and multiecho pulse sequences of the thoracic spine were obtained according  to standard protocol without IV contrast.   Comparison: Cervical spine MRI and brain MRI 10/16/04 and 10/31/04, respectively, were reviewed.   Findings: Vertebral body height and alignment are maintained. Hemangiomas are noted in the T1 and T2 vertebral bodies and appear unchanged. Patient has two small syrinxes  within the cord with one extending from T5 to T7 and a second one inferiorly tracking along the posterior aspect of T11. Note is made that the patient had a small cervical cord syrinx on the prior study. This is likely an incidental finding. There is no disc bulge or protrusion, with the central canal and foramina widely patent at all levels. No evidence of post-traumatic deformity is identified.   IMPRESSION:    1. 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   2. Negative for central canal or foraminal stenosis.   MRI CERVICAL SPINE WITHOUT CONTRAST    Technique:  Multiplanar and multiecho pulse sequences of the cervical spine, to include the craniocervical junction and cervicothoracic junction, were obtained according to standard protocol without intravenous contrast.    Comparison: 01/30/2005.    Findings: Motion degraded exam.  Tiny syrinx most notable at the C6-C7 level appears relatively stable.  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  Cervical medullary junction unremarkable.    Probable hemangiomas T1 and T2 unchanged.    C2-3:  Negative.    C3-4:  Negative.    C4-5:  Negative.    C5-6:  Very small broad-based disc osteophyte complex slightly greater to the left.  Minimal spinal stenosis.    C6-7:  Very small broad-based disc osteophyte complex.  Minimal spinal stenosis and foraminal narrowing.    C7-T1:  Mild bilateral facet joint degenerative changes.  Minimal bulge.  Minimal bilateral foraminal narrowing.    IMPRESSION: Motion degraded exam.    Tiny syrinx most notable at the C6-C7 level appears relatively stable.    Very mild degenerative changes without significant spinal stenosis. Slight worsening of degenerative disc disease at L4-5 with annular tearing annular bulging.  This does not appear to cause gross neural compression but  could certainly be a cause of pain.   Very mild facet degenerative changes at L3-4 and L4-5.   Physicians involved in your care Any changes since last visit?  no Patient is getting evaluation by neurology at [facility]. 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  Family History  Problem Relation Age of Onset  . Diabetes type II Mother   . Colon polyps Mother   . Diverticulitis Mother   . Diabetes Mother   . Heart disease Mother   . Diabetes type II Brother   . Uterine cancer    . Ovarian cancer     Social History   Social History  . Marital Status: Married    Spouse Name: N/A  . Number of Children: 2  . Years of Education: N/A   Occupational History  . Disability    Social History Main Topics  . Smoking status: Former Smoker    Types: Cigarettes    Quit date: 06/30/2011  . Smokeless tobacco: Never Used  . Alcohol Use: No  . Drug Use: No  . Sexual Activity: Yes   Other Topics Concern  . None   Social History Narrative   Past Surgical History  Procedure Laterality Date  . Cesarean section    .  Partial hysterectomy    . Tonsillectomy    . Left elbow surgery     Past Medical History  Diagnosis Date  . Fibromyalgia   . Anxiety   . Depression   . Degenerative disc disease     Spinal, small syrinx  . Chronic pain     Dr. Letta Pate  . GERD (gastroesophageal reflux disease)   . Esophageal stricture     Status post dilatation  . Syncope     Negative tilt table test  . Palpitations     No documented arrhythmias  . Hiatal hernia   . Internal hemorrhoids without mention of complication   . Personal history of colonic polyps 10/04/2000    HYPERPLASTIC POLYP  . Irritable bowel syndrome   . POTS (postural orthostatic tachycardia syndrome)    BP 112/75 mmHg  Pulse 100  Resp 14  SpO2 99%  Opioid Risk Score:   Fall Risk Score:  `1  Depression screen PHQ 2/9  Depression screen  [facility] 2/9 04/23/2015 03/26/2015 02/22/2015 10/22/2014  Decreased Interest 1 1 1 2   Down, Depressed, Hopeless 1 1 1 3   PHQ - 2 Score 2 2 2 5   Altered sleeping - - - 2  Tired, decreased energy - - - 3  Change in appetite - - - 0  Feeling bad or failure about yourself  - - - 2  Trouble concentrating - - - 1  Moving slowly or fidgety/restless - - - 2  Suicidal thoughts - - - 0  PHQ-9 Score - - - 15

## 2015-06-18 NOTE — Patient Instructions (Signed)
Lateral Epicondylitis With Rehab Lateral epicondylitis involves inflammation and pain around the outer portion of the elbow. The pain is caused by inflammation of the tendons in the forearm that bring back (extend) the wrist. Lateral epicondylitis is also called tennis elbow, because it is very common in tennis players. However, it may occur in any individual who extends the wrist repetitively. If lateral epicondylitis is left untreated, it may become a chronic problem. SYMPTOMS   Pain, tenderness, and inflammation on the outer (lateral) side of the elbow.  Pain or weakness with gripping activities.  Pain that increases with wrist-twisting motions (playing tennis, using a screwdriver, opening a door or a jar).  Pain with lifting objects, including a coffee cup. CAUSES  Lateral epicondylitis is caused by inflammation of the tendons that extend the wrist. Causes of injury may include:  Repetitive stress and strain on the muscles and tendons that extend the wrist.  Sudden change in activity level or intensity.  Incorrect grip in racquet sports.  Incorrect grip size of racquet (often too large).  Incorrect hitting position or technique (usually backhand, leading with the elbow).  Using a racket that is too heavy. RISK INCREASES WITH:  Sports or occupations that require repetitive and/or strenuous forearm and wrist movements (tennis, squash, racquetball, carpentry).  Poor wrist and forearm strength and flexibility.  Failure to warm up properly before activity.  Resuming activity before healing, rehabilitation, and conditioning are complete. PREVENTION   Warm up and stretch properly before activity.  Maintain physical fitness:  Strength, flexibility, and endurance.  Cardiovascular fitness.  Wear and use properly fitted equipment.  Learn and use proper technique and have a coach correct improper technique.  Wear a tennis elbow (counterforce) brace. PROGNOSIS  The course of  this condition depends on the degree of the injury. If treated properly, acute cases (symptoms lasting less than 4 weeks) are often resolved in 2 to 6 weeks. Chronic (longer lasting cases) often resolve in 3 to 6 months but may require physical therapy. RELATED COMPLICATIONS   Frequently recurring symptoms, resulting in a chronic problem. Properly treating the problem the first time decreases frequency of recurrence.  Chronic inflammation, scarring tendon degeneration, and partial tendon tear, requiring surgery.  Delayed healing or resolution of symptoms. TREATMENT  Treatment first involves the use of ice and medicine to reduce pain and inflammation. Strengthening and stretching exercises may help reduce discomfort if performed regularly. These exercises may be performed at home if the condition is an acute injury. Chronic cases may require a referral to a physical therapist for evaluation and treatment. Your caregiver may advise a corticosteroid injection to help reduce inflammation. Rarely, surgery is needed. MEDICATION  If pain medicine is needed, nonsteroidal anti-inflammatory medicines (aspirin and ibuprofen), or other minor pain relievers (acetaminophen), are often advised.  Do not take pain medicine for 7 days before surgery.  Prescription pain relievers may be given, if your caregiver thinks they are needed. Use only as directed and only as much as you need.  Corticosteroid injections may be recommended. These injections should be reserved only for the most severe cases, because they can only be given a certain number of times. HEAT AND COLD  Cold treatment (icing) should be applied for 10 to 15 minutes every 2 to 3 hours for inflammation and pain, and immediately after activity that aggravates your symptoms. Use ice packs or an ice massage.  Heat treatment may be used before performing stretching and strengthening activities prescribed by your caregiver, physical therapist,  or  Product/process development scientist. Use a heat pack or a warm water soak. SEEK MEDICAL CARE IF: Symptoms get worse or do not improve in 2 weeks, despite treatment. EXERCISES  RANGE OF MOTION (ROM) AND STRETCHING EXERCISES - Epicondylitis, Lateral (Tennis Elbow) These exercises may help you when beginning to rehabilitate your injury. Your symptoms may go away with or without further involvement from your physician, physical therapist, or athletic trainer. While completing these exercises, remember:   Restoring tissue flexibility helps normal motion to return to the joints. This allows healthier, less painful movement and activity.  An effective stretch should be held for at least 30 seconds.  A stretch should never be painful. You should only feel a gentle lengthening or release in the stretched tissue. RANGE OF MOTION - Wrist Flexion, Active-Assisted  Extend your right / left elbow with your fingers pointing down.*  Gently pull the back of your hand towards you, until you feel a gentle stretch on the top of your forearm.  Hold this position for __________ seconds. Repeat __________ times. Complete this exercise __________ times per day.  *If directed by your physician, physical therapist or athletic trainer, complete this stretch with your elbow bent, rather than extended. RANGE OF MOTION - Wrist Extension, Active-Assisted  Extend your right / left elbow and turn your palm upwards.*  Gently pull your palm and fingertips back, so your wrist extends and your fingers point more toward the ground.  You should feel a gentle stretch on the inside of your forearm.  Hold this position for __________ seconds. Repeat __________ times. Complete this exercise __________ times per day. *If directed by your physician, physical therapist or athletic trainer, complete this stretch with your elbow bent, rather than extended. STRETCH - Wrist Flexion  Place the back of your right / left hand on a tabletop, leaving your  elbow slightly bent. Your fingers should point away from your body.  Gently press the back of your hand down onto the table by straightening your elbow. You should feel a stretch on the top of your forearm.  Hold this position for __________ seconds. Repeat __________ times. Complete this stretch __________ times per day.  STRETCH - Wrist Extension   Place your right / left fingertips on a tabletop, leaving your elbow slightly bent. Your fingers should point backwards.  Gently press your fingers and palm down onto the table by straightening your elbow. You should feel a stretch on the inside of your forearm.  Hold this position for __________ seconds. Repeat __________ times. Complete this stretch __________ times per day.  STRENGTHENING EXERCISES - Epicondylitis, Lateral (Tennis Elbow) These exercises may help you when beginning to rehabilitate your injury. They may resolve your symptoms with or without further involvement from your physician, physical therapist, or athletic trainer. While completing these exercises, remember:   Muscles can gain both the endurance and the strength needed for everyday activities through controlled exercises.  Complete these exercises as instructed by your physician, physical therapist or athletic trainer. Increase the resistance and repetitions only as guided.  You may experience muscle soreness or fatigue, but the pain or discomfort you are trying to eliminate should never worsen during these exercises. If this pain does get worse, stop and make sure you are following the directions exactly. If the pain is still present after adjustments, discontinue the exercise until you can discuss the trouble with your caregiver. STRENGTH - Wrist Flexors  Sit with your right / left forearm palm-up and fully supported  on a table or countertop. Your elbow should be resting below the height of your shoulder. Allow your wrist to extend over the edge of the  surface.  Loosely holding a __________ weight, or a piece of rubber exercise band or tubing, slowly curl your hand up toward your forearm.  Hold this position for __________ seconds. Slowly lower the wrist back to the starting position in a controlled manner. Repeat __________ times. Complete this exercise __________ times per day.  STRENGTH - Wrist Extensors  Sit with your right / left forearm palm-down and fully supported on a table or countertop. Your elbow should be resting below the height of your shoulder. Allow your wrist to extend over the edge of the surface.  Loosely holding a __________ weight, or a piece of rubber exercise band or tubing, slowly curl your hand up toward your forearm.  Hold this position for __________ seconds. Slowly lower the wrist back to the starting position in a controlled manner. Repeat __________ times. Complete this exercise __________ times per day.  STRENGTH - Ulnar Deviators  Stand with a ____________________ weight in your right / left hand, or sit while holding a rubber exercise band or tubing, with your healthy arm supported on a table or countertop.  Move your wrist, so that your pinkie travels toward your forearm and your thumb moves away from your forearm.  Hold this position for __________ seconds and then slowly lower the wrist back to the starting position. Repeat __________ times. Complete this exercise __________ times per day STRENGTH - Radial Deviators  Stand with a ____________________ weight in your right / left hand, or sit while holding a rubber exercise band or tubing, with your injured arm supported on a table or countertop.  Raise your hand upward in front of you or pull up on the rubber tubing.  Hold this position for __________ seconds and then slowly lower the wrist back to the starting position. Repeat __________ times. Complete this exercise __________ times per day. STRENGTH - Forearm Supinators   Sit with your right /  left forearm supported on a table, keeping your elbow below shoulder height. Rest your hand over the edge, palm down.  Gently grip a hammer or a soup ladle.  Without moving your elbow, slowly turn your palm and hand upward to a "thumbs-up" position.  Hold this position for __________ seconds. Slowly return to the starting position. Repeat __________ times. Complete this exercise __________ times per day.  STRENGTH - Forearm Pronators   Sit with your right / left forearm supported on a table, keeping your elbow below shoulder height. Rest your hand over the edge, palm up.  Gently grip a hammer or a soup ladle.  Without moving your elbow, slowly turn your palm and hand upward to a "thumbs-up" position.  Hold this position for __________ seconds. Slowly return to the starting position. Repeat __________ times. Complete this exercise __________ times per day.  STRENGTH - Grip  Grasp a tennis ball, a dense sponge, or a large, rolled sock in your hand.  Squeeze as hard as you can, without increasing any pain.  Hold this position for __________ seconds. Release your grip slowly. Repeat __________ times. Complete this exercise __________ times per day.  STRENGTH - Elbow Extensors, Isometric  Stand or sit upright, on a firm surface. Place your right / left arm so that your palm faces your stomach, and it is at the height of your waist.  Place your opposite hand on the underside  of your forearm. Gently push up as your right / left arm resists. Push as hard as you can with both arms, without causing any pain or movement at your right / left elbow. Hold this stationary position for __________ seconds. Gradually release the tension in both arms. Allow your muscles to relax completely before repeating.   This information is not intended to replace advice given to you by your health care provider. Make sure you discuss any questions you have with your health care provider.   Document Released:  07/20/2005 Document Revised: 08/10/2014 Document Reviewed: 11/01/2008 Elsevier Interactive Patient Education [employer].

## 2015-06-26 DIAGNOSIS — Z5181 Encounter for therapeutic drug level monitoring: Secondary | ICD-10-CM | POA: Diagnosis not present

## 2015-06-26 DIAGNOSIS — Z79899 Other long term (current) drug therapy: Secondary | ICD-10-CM | POA: Diagnosis not present

## 2015-07-16 ENCOUNTER — Encounter: Payer: Medicare Other | Attending: Physical Medicine & Rehabilitation | Admitting: Registered Nurse

## 2015-07-16 DIAGNOSIS — M797 Fibromyalgia: Secondary | ICD-10-CM | POA: Insufficient documentation

## 2015-07-16 DIAGNOSIS — R55 Syncope and collapse: Secondary | ICD-10-CM | POA: Insufficient documentation

## 2015-07-16 DIAGNOSIS — I951 Orthostatic hypotension: Secondary | ICD-10-CM | POA: Insufficient documentation

## 2015-07-16 DIAGNOSIS — F329 Major depressive disorder, single episode, unspecified: Secondary | ICD-10-CM | POA: Insufficient documentation

## 2015-07-16 DIAGNOSIS — M542 Cervicalgia: Secondary | ICD-10-CM | POA: Insufficient documentation

## 2015-07-16 DIAGNOSIS — R002 Palpitations: Secondary | ICD-10-CM | POA: Insufficient documentation

## 2015-07-16 DIAGNOSIS — K449 Diaphragmatic hernia without obstruction or gangrene: Secondary | ICD-10-CM | POA: Insufficient documentation

## 2015-07-16 DIAGNOSIS — K589 Irritable bowel syndrome without diarrhea: Secondary | ICD-10-CM | POA: Insufficient documentation

## 2015-07-16 DIAGNOSIS — M5136 Other intervertebral disc degeneration, lumbar region: Secondary | ICD-10-CM | POA: Insufficient documentation

## 2015-07-16 DIAGNOSIS — K648 Other hemorrhoids: Secondary | ICD-10-CM | POA: Insufficient documentation

## 2015-07-16 DIAGNOSIS — M47892 Other spondylosis, cervical region: Secondary | ICD-10-CM | POA: Insufficient documentation

## 2015-07-16 DIAGNOSIS — K222 Esophageal obstruction: Secondary | ICD-10-CM | POA: Insufficient documentation

## 2015-07-16 DIAGNOSIS — R000 Tachycardia, unspecified: Secondary | ICD-10-CM | POA: Insufficient documentation

## 2015-07-16 DIAGNOSIS — F419 Anxiety disorder, unspecified: Secondary | ICD-10-CM | POA: Insufficient documentation

## 2015-07-16 DIAGNOSIS — K219 Gastro-esophageal reflux disease without esophagitis: Secondary | ICD-10-CM | POA: Insufficient documentation

## 2015-07-16 DIAGNOSIS — M545 Low back pain: Secondary | ICD-10-CM | POA: Insufficient documentation

## 2015-07-16 DIAGNOSIS — Z86010 Personal history of colonic polyps: Secondary | ICD-10-CM | POA: Insufficient documentation

## 2015-07-16 DIAGNOSIS — Z599 Problem related to housing and economic circumstances, unspecified: Secondary | ICD-10-CM | POA: Insufficient documentation

## 2015-07-16 DIAGNOSIS — Z760 Encounter for issue of repeat prescription: Secondary | ICD-10-CM | POA: Insufficient documentation

## 2015-07-16 DIAGNOSIS — Z87891 Personal history of nicotine dependence: Secondary | ICD-10-CM | POA: Insufficient documentation

## 2015-07-16 DIAGNOSIS — K0889 Other specified disorders of teeth and supporting structures: Secondary | ICD-10-CM | POA: Insufficient documentation

## 2015-07-16 DIAGNOSIS — G8929 Other chronic pain: Secondary | ICD-10-CM | POA: Insufficient documentation

## 2015-07-16 DIAGNOSIS — R51 Headache: Secondary | ICD-10-CM | POA: Insufficient documentation

## 2015-07-25 ENCOUNTER — Encounter: Payer: Medicare Other | Admitting: Registered Nurse

## 2015-07-30 ENCOUNTER — Telehealth: Payer: Self-pay | Admitting: Physical Medicine & Rehabilitation

## 2015-07-30 NOTE — Telephone Encounter (Signed)
Apolonio Schneiders (her number is [phone]) and Gretta Began (her number is [phone] ext [phone]) with [facility] called about prior auth for patients medication Voltaren Gel.

## 2015-07-31 NOTE — Telephone Encounter (Signed)
Verified [name] has hiatal hernia and possible gerd with intolerance of NSAIDS orally

## 2015-08-01 ENCOUNTER — Encounter: Payer: Self-pay | Admitting: Registered Nurse

## 2015-08-01 ENCOUNTER — Encounter (HOSPITAL_BASED_OUTPATIENT_CLINIC_OR_DEPARTMENT_OTHER): Payer: Medicare Other | Admitting: Registered Nurse

## 2015-08-01 VITALS — BP 120/89 | HR 104 | Resp 16

## 2015-08-01 DIAGNOSIS — M47817 Spondylosis without myelopathy or radiculopathy, lumbosacral region: Secondary | ICD-10-CM

## 2015-08-01 DIAGNOSIS — K222 Esophageal obstruction: Secondary | ICD-10-CM | POA: Diagnosis not present

## 2015-08-01 DIAGNOSIS — K449 Diaphragmatic hernia without obstruction or gangrene: Secondary | ICD-10-CM | POA: Diagnosis not present

## 2015-08-01 DIAGNOSIS — M791 Myalgia: Secondary | ICD-10-CM | POA: Diagnosis not present

## 2015-08-01 DIAGNOSIS — R51 Headache: Secondary | ICD-10-CM | POA: Diagnosis not present

## 2015-08-01 DIAGNOSIS — R002 Palpitations: Secondary | ICD-10-CM | POA: Diagnosis not present

## 2015-08-01 DIAGNOSIS — M545 Low back pain: Secondary | ICD-10-CM | POA: Diagnosis not present

## 2015-08-01 DIAGNOSIS — K589 Irritable bowel syndrome without diarrhea: Secondary | ICD-10-CM | POA: Diagnosis not present

## 2015-08-01 DIAGNOSIS — M47892 Other spondylosis, cervical region: Secondary | ICD-10-CM | POA: Diagnosis not present

## 2015-08-01 DIAGNOSIS — IMO0001 Reserved for inherently not codable concepts without codable children: Secondary | ICD-10-CM

## 2015-08-01 DIAGNOSIS — I951 Orthostatic hypotension: Secondary | ICD-10-CM | POA: Diagnosis not present

## 2015-08-01 DIAGNOSIS — F419 Anxiety disorder, unspecified: Secondary | ICD-10-CM | POA: Diagnosis not present

## 2015-08-01 DIAGNOSIS — R000 Tachycardia, unspecified: Secondary | ICD-10-CM

## 2015-08-01 DIAGNOSIS — K0889 Other specified disorders of teeth and supporting structures: Secondary | ICD-10-CM | POA: Diagnosis not present

## 2015-08-01 DIAGNOSIS — K648 Other hemorrhoids: Secondary | ICD-10-CM | POA: Diagnosis not present

## 2015-08-01 DIAGNOSIS — K219 Gastro-esophageal reflux disease without esophagitis: Secondary | ICD-10-CM | POA: Diagnosis not present

## 2015-08-01 DIAGNOSIS — Z86010 Personal history of colonic polyps: Secondary | ICD-10-CM | POA: Diagnosis not present

## 2015-08-01 DIAGNOSIS — R55 Syncope and collapse: Secondary | ICD-10-CM | POA: Diagnosis not present

## 2015-08-01 DIAGNOSIS — M5136 Other intervertebral disc degeneration, lumbar region: Secondary | ICD-10-CM | POA: Diagnosis not present

## 2015-08-01 DIAGNOSIS — G90A Postural orthostatic tachycardia syndrome (POTS): Secondary | ICD-10-CM

## 2015-08-01 DIAGNOSIS — Z760 Encounter for issue of repeat prescription: Secondary | ICD-10-CM | POA: Diagnosis not present

## 2015-08-01 DIAGNOSIS — Z87891 Personal history of nicotine dependence: Secondary | ICD-10-CM | POA: Diagnosis not present

## 2015-08-01 DIAGNOSIS — M797 Fibromyalgia: Secondary | ICD-10-CM

## 2015-08-01 DIAGNOSIS — Z79899 Other long term (current) drug therapy: Secondary | ICD-10-CM

## 2015-08-01 DIAGNOSIS — M609 Myositis, unspecified: Secondary | ICD-10-CM

## 2015-08-01 DIAGNOSIS — Z599 Problem related to housing and economic circumstances, unspecified: Secondary | ICD-10-CM | POA: Diagnosis not present

## 2015-08-01 DIAGNOSIS — Z5181 Encounter for therapeutic drug level monitoring: Secondary | ICD-10-CM

## 2015-08-01 DIAGNOSIS — M542 Cervicalgia: Secondary | ICD-10-CM | POA: Diagnosis not present

## 2015-08-01 DIAGNOSIS — G8929 Other chronic pain: Secondary | ICD-10-CM | POA: Diagnosis not present

## 2015-08-01 DIAGNOSIS — G894 Chronic pain syndrome: Secondary | ICD-10-CM

## 2015-08-01 DIAGNOSIS — F329 Major depressive disorder, single episode, unspecified: Secondary | ICD-10-CM | POA: Diagnosis not present

## 2015-08-01 MED ORDER — HYDROCODONE-ACETAMINOPHEN 10-325 MG PO TABS: 2.0000 | ORAL_TABLET | Freq: Three times a day (TID) | ORAL | Status: DC | PRN

## 2015-08-01 NOTE — Progress Notes (Signed)
Subjective:    Patient ID: [name], female    DOB: [birth year], 39 y.o.   MRN: [id number]  HPI: [name] is a 39 year old female who returns for follow up for chronic pain and medication refill. She says her pain is located in her head ( headache), right shoulder, right elbow and mid-lower back. She rates her pain 6. Her current exercise regime is walking.  Also states she has missed her appointments due to death in the family.Her great-great grandmother, uncle,cousin and friend  passed away in the last few weeks very tearful. Emotional support given. She forgot her medication bottle according to [facility]one was picked up on 06/24/2015. Educated on the Narcotic Policy she verbalizes understanding.  Pain Inventory Average Pain 7 Pain Right Now 6 My pain is sharp, burning, dull, stabbing, tingling and aching  In the last 24 hours, has pain interfered with the following? General activity 8 Relation with others 8 Enjoyment of life 8 What TIME of day is your pain at its worst? morning, evening, night Sleep (in general) Fair  Pain is worse with: walking, bending, sitting, inactivity and standing Pain improves with: rest, heat/ice, medication and TENS Relief from Meds: 5  Mobility ability to climb steps?  yes do you drive?  yes needs help with transfers  Function disabled: date disabled NA I need assistance with the following:  meal prep, household duties and shopping  Neuro/Psych weakness numbness tremor tingling trouble walking spasms dizziness confusion depression anxiety  Prior Studies Any changes since last visit?  no  Physicians involved in your care Any changes since last visit?  no   Family History  Problem Relation Age of Onset  . Diabetes type II Mother   . Colon polyps Mother   . Diverticulitis Mother   . Diabetes Mother   . Heart disease Mother   . Diabetes type II Brother   . Uterine cancer    . Ovarian cancer     Social  History   Social History  . Marital Status: Married    Spouse Name: N/A  . Number of Children: 2  . Years of Education: N/A   Occupational History  . Disability    Social History Main Topics  . Smoking status: Former Smoker    Types: Cigarettes    Quit date: 06/30/2011  . Smokeless tobacco: Never Used  . Alcohol Use: No  . Drug Use: No  . Sexual Activity: Yes   Other Topics Concern  . None   Social History Narrative   Past Surgical History  Procedure Laterality Date  . Cesarean section    . Partial hysterectomy    . Tonsillectomy    . Left elbow surgery     Past Medical History  Diagnosis Date  . Fibromyalgia   . Anxiety   . Depression   . Degenerative disc disease     Spinal, small syrinx  . Chronic pain     Dr. Letta Pate  . GERD (gastroesophageal reflux disease)   . Esophageal stricture     Status post dilatation  . Syncope     Negative tilt table test  . Palpitations     No documented arrhythmias  . Hiatal hernia   . Internal hemorrhoids without mention of complication   . Personal history of colonic polyps 10/04/2000    HYPERPLASTIC POLYP  . Irritable bowel syndrome   . POTS (postural orthostatic tachycardia syndrome)    BP 120/89 mmHg  Pulse  104  Resp 16  SpO2 97%  Opioid Risk Score:   Fall Risk Score:  `1  Depression screen PHQ 2/9  Depression screen [facility] 2/9 04/23/2015 03/26/2015 02/22/2015 10/22/2014  Decreased Interest 1 1 1 2   Down, Depressed, Hopeless 1 1 1 3   PHQ - 2 Score 2 2 2 5   Altered sleeping - - - 2  Tired, decreased energy - - - 3  Change in appetite - - - 0  Feeling bad or failure about yourself  - - - 2  Trouble concentrating - - - 1  Moving slowly or fidgety/restless - - - 2  Suicidal thoughts - - - 0  PHQ-9 Score - - - 15     Review of Systems  Constitutional: Positive for appetite change.  Gastrointestinal: Positive for nausea, diarrhea and constipation.  Musculoskeletal: Positive for gait problem.  Neurological:  Positive for dizziness, tremors, weakness and numbness.       Tingling spasms  Psychiatric/Behavioral: Positive for confusion and dysphoric mood. The patient is nervous/anxious.   All other systems reviewed and are negative.      Objective:   Physical Exam  Constitutional: She is oriented to person, place, and time. She appears well-developed and well-nourished.  HENT:  Head: Normocephalic and atraumatic.  Neck: Normal range of motion. Neck supple.  Cardiovascular: Normal rate and regular rhythm.   Pulmonary/Chest: Effort normal and breath sounds normal.  Musculoskeletal:  Normal Muscle Bulk and Muscle Testing Reveals: Upper Extremities: Full ROM and Muscle Strength 5/5 Thoracic Paraspinal Tenderness: T-10- T-12 Lower Extremities: Full ROM and Muscle Strength 5/5 Arises from chair with ease Narrow Based gait  Neurological: She is alert and oriented to person, place, and time.  Skin: Skin is warm and dry.  Psychiatric: She has a normal mood and affect.  Nursing note and vitals reviewed.         Assessment & Plan:  1. 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  3. Fibromyalgia: Continue with Heat and exercise therapy.  4.Depression and anxiety: Psychiatry Following  5. Orthostatic hypotension: .Cardiology Following  6. Chronic Headaches: Continue Topamax 25 mg BID  30 minutes of face to face patient care time was spent during this visit. All questions were encouraged and answered.

## 2015-08-28 ENCOUNTER — Encounter: Payer: Medicare Other | Admitting: Registered Nurse

## 2015-08-29 ENCOUNTER — Telehealth: Payer: Self-pay | Admitting: Physical Medicine & Rehabilitation

## 2015-08-29 ENCOUNTER — Encounter: Payer: Self-pay | Admitting: Registered Nurse

## 2015-08-29 ENCOUNTER — Encounter: Payer: Medicare Other | Attending: Physical Medicine & Rehabilitation | Admitting: Registered Nurse

## 2015-08-29 VITALS — BP 111/75 | HR 91 | Resp 14

## 2015-08-29 DIAGNOSIS — R51 Headache: Secondary | ICD-10-CM | POA: Diagnosis not present

## 2015-08-29 DIAGNOSIS — G90A Postural orthostatic tachycardia syndrome (POTS): Secondary | ICD-10-CM

## 2015-08-29 DIAGNOSIS — K222 Esophageal obstruction: Secondary | ICD-10-CM | POA: Insufficient documentation

## 2015-08-29 DIAGNOSIS — Z79899 Other long term (current) drug therapy: Secondary | ICD-10-CM | POA: Diagnosis not present

## 2015-08-29 DIAGNOSIS — M47817 Spondylosis without myelopathy or radiculopathy, lumbosacral region: Secondary | ICD-10-CM | POA: Diagnosis not present

## 2015-08-29 DIAGNOSIS — M797 Fibromyalgia: Secondary | ICD-10-CM

## 2015-08-29 DIAGNOSIS — R002 Palpitations: Secondary | ICD-10-CM | POA: Insufficient documentation

## 2015-08-29 DIAGNOSIS — Z86010 Personal history of colonic polyps: Secondary | ICD-10-CM | POA: Diagnosis not present

## 2015-08-29 DIAGNOSIS — G8929 Other chronic pain: Secondary | ICD-10-CM | POA: Insufficient documentation

## 2015-08-29 DIAGNOSIS — F419 Anxiety disorder, unspecified: Secondary | ICD-10-CM | POA: Diagnosis not present

## 2015-08-29 DIAGNOSIS — K589 Irritable bowel syndrome without diarrhea: Secondary | ICD-10-CM | POA: Diagnosis not present

## 2015-08-29 DIAGNOSIS — G894 Chronic pain syndrome: Secondary | ICD-10-CM | POA: Diagnosis not present

## 2015-08-29 DIAGNOSIS — Z87891 Personal history of nicotine dependence: Secondary | ICD-10-CM | POA: Insufficient documentation

## 2015-08-29 DIAGNOSIS — Z5181 Encounter for therapeutic drug level monitoring: Secondary | ICD-10-CM

## 2015-08-29 DIAGNOSIS — M609 Myositis, unspecified: Secondary | ICD-10-CM

## 2015-08-29 DIAGNOSIS — R55 Syncope and collapse: Secondary | ICD-10-CM | POA: Insufficient documentation

## 2015-08-29 DIAGNOSIS — M791 Myalgia: Secondary | ICD-10-CM

## 2015-08-29 DIAGNOSIS — K0889 Other specified disorders of teeth and supporting structures: Secondary | ICD-10-CM | POA: Insufficient documentation

## 2015-08-29 DIAGNOSIS — K219 Gastro-esophageal reflux disease without esophagitis: Secondary | ICD-10-CM | POA: Insufficient documentation

## 2015-08-29 DIAGNOSIS — M5136 Other intervertebral disc degeneration, lumbar region: Secondary | ICD-10-CM | POA: Insufficient documentation

## 2015-08-29 DIAGNOSIS — Z760 Encounter for issue of repeat prescription: Secondary | ICD-10-CM | POA: Insufficient documentation

## 2015-08-29 DIAGNOSIS — Z599 Problem related to housing and economic circumstances, unspecified: Secondary | ICD-10-CM | POA: Diagnosis not present

## 2015-08-29 DIAGNOSIS — M542 Cervicalgia: Secondary | ICD-10-CM | POA: Insufficient documentation

## 2015-08-29 DIAGNOSIS — M47892 Other spondylosis, cervical region: Secondary | ICD-10-CM | POA: Diagnosis not present

## 2015-08-29 DIAGNOSIS — K648 Other hemorrhoids: Secondary | ICD-10-CM | POA: Diagnosis not present

## 2015-08-29 DIAGNOSIS — M545 Low back pain: Secondary | ICD-10-CM | POA: Diagnosis not present

## 2015-08-29 DIAGNOSIS — F329 Major depressive disorder, single episode, unspecified: Secondary | ICD-10-CM | POA: Diagnosis not present

## 2015-08-29 DIAGNOSIS — K449 Diaphragmatic hernia without obstruction or gangrene: Secondary | ICD-10-CM | POA: Diagnosis not present

## 2015-08-29 DIAGNOSIS — I951 Orthostatic hypotension: Secondary | ICD-10-CM | POA: Diagnosis not present

## 2015-08-29 DIAGNOSIS — IMO0001 Reserved for inherently not codable concepts without codable children: Secondary | ICD-10-CM

## 2015-08-29 DIAGNOSIS — M25531 Pain in right wrist: Secondary | ICD-10-CM

## 2015-08-29 DIAGNOSIS — R000 Tachycardia, unspecified: Secondary | ICD-10-CM | POA: Diagnosis not present

## 2015-08-29 MED ORDER — HYDROCODONE-ACETAMINOPHEN 10-325 MG PO TABS: 2.0000 | ORAL_TABLET | Freq: Three times a day (TID) | ORAL | Status: DC | PRN

## 2015-08-29 NOTE — Progress Notes (Signed)
Subjective:    Patient ID: [name], female    DOB: [birth year], 40 y.o.   MRN: [id number]  HPI: [name] is a 40 year old female who returns for follow up for chronic pain and medication refill. She says her pain is located in her head ( headache),  neck, right wrist, right shoulder, right elbow and mid-lower back. She rates her pain 6. Her current exercise regime is walking.  Mrs. Alcorn seen Dr. Marvel Plan cardiologist he placed a Zio Patch monitor for 1- 14 days, she's awaiting her neurology appointment. Her PCP following.  Pain Inventory Average Pain 7 Pain Right Now 6 My pain is intermittent, sharp, burning, dull, stabbing, tingling and aching  In the last 24 hours, has pain interfered with the following? General activity 8 Relation with others 8 Enjoyment of life 8 What TIME of day is your pain at its worst? morning, evening, night Sleep (in general) Poor  Pain is worse with: walking, bending, sitting, inactivity, standing, unsure and some activites Pain improves with: rest, heat/ice, therapy/exercise, medication and TENS Relief from Meds: 6  Mobility walk without assistance how many minutes can you walk? 10-15 ability to climb steps?  yes do you drive?  yes needs help with transfers  Function disabled: date disabled . I need assistance with the following:  meal prep, household duties and shopping Do you have any goals in this area?  yes  Neuro/Psych numbness tremor tingling trouble walking spasms dizziness confusion depression anxiety  Prior Studies Any changes since last visit?  yes eeg study  Physicians involved in your care Any changes since last visit?  yes cardiologist - Dr. Marvel Plan   Family History  Problem Relation Age of Onset  . Diabetes type II Mother   . Colon polyps Mother   . Diverticulitis Mother   . Diabetes Mother   . Heart disease Mother   . Diabetes type II Brother   . Uterine cancer    . Ovarian cancer      Social History   Social History  . Marital Status: Married    Spouse Name: N/A  . Number of Children: 2  . Years of Education: N/A   Occupational History  . Disability    Social History Main Topics  . Smoking status: Former Smoker    Types: Cigarettes    Quit date: 06/30/2011  . Smokeless tobacco: Never Used  . Alcohol Use: No  . Drug Use: No  . Sexual Activity: Yes   Other Topics Concern  . None   Social History Narrative   Past Surgical History  Procedure Laterality Date  . Cesarean section    . Partial hysterectomy    . Tonsillectomy    . Left elbow surgery     Past Medical History  Diagnosis Date  . Fibromyalgia   . Anxiety   . Depression   . Degenerative disc disease     Spinal, small syrinx  . Chronic pain     Dr. Letta Pate  . GERD (gastroesophageal reflux disease)   . Esophageal stricture     Status post dilatation  . Syncope     Negative tilt table test  . Palpitations     No documented arrhythmias  . Hiatal hernia   . Internal hemorrhoids without mention of complication   . Personal history of colonic polyps 10/04/2000    HYPERPLASTIC POLYP  . Irritable bowel syndrome   . POTS (postural orthostatic tachycardia syndrome)  BP 111/75 mmHg  Pulse 91  Resp 14  SpO2 100%  Opioid Risk Score:   Fall Risk Score:  `1  Depression screen PHQ 2/9  Depression screen [facility] 2/9 04/23/2015 03/26/2015 02/22/2015 10/22/2014  Decreased Interest 1 1 1 2   Down, Depressed, Hopeless 1 1 1 3   PHQ - 2 Score 2 2 2 5   Altered sleeping - - - 2  Tired, decreased energy - - - 3  Change in appetite - - - 0  Feeling bad or failure about yourself  - - - 2  Trouble concentrating - - - 1  Moving slowly or fidgety/restless - - - 2  Suicidal thoughts - - - 0  PHQ-9 Score - - - 15     Review of Systems  Constitutional: Positive for chills, diaphoresis and appetite change.  Respiratory: Positive for shortness of breath.   Gastrointestinal: Positive for nausea,  diarrhea and constipation.  All other systems reviewed and are negative.      Objective:   Physical Exam  Constitutional: She is oriented to person, place, and time. She appears well-developed and well-nourished.  HENT:  Head: Normocephalic and atraumatic.  Neck: Normal range of motion. Neck supple.  Cardiovascular: Normal rate and regular rhythm.   Pulmonary/Chest: Effort normal and breath sounds normal.  Musculoskeletal:  Normal Muscle Bulk and Muscle Testing Reveals: Upper Extremities: Full ROM and Muscle Strength 5/5 Right AC Joint Tenderness Thoracic Paraspinal Tenderness: T-11- T-12 Lumbar Paraspinal Tenderness: L-3- L-4 Lower Extremities: Full ROM and Muscle Strength 5/5 Arises from chair with ease Narrow Based gait  Neurological: She is alert and oriented to person, place, and time.  Skin: Skin is warm and dry.  Psychiatric: She has a normal mood and affect.  Nursing note and vitals reviewed.         Assessment & Plan:  1. 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  3. Fibromyalgia: Continue with Heat and exercise therapy.  4.Depression and anxiety: Psychiatry Following  5. Orthostatic hypotension: .Cardiology Following  6. Chronic Headaches: Continue Topamax 25 mg BID  30 minutes of face to face patient care time was spent during this visit. All questions were encouraged and answered.

## 2015-08-29 NOTE — Telephone Encounter (Signed)
Spoke to Glass blower/designer - she agrees to to remove the fees is the patient can bring in obituaries Spoke to the patient and informed her to bring in obits in order to remove the fees Patient agreed

## 2015-09-05 LAB — TOXASSURE SELECT,+ANTIDEPR,UR: PDF: 0

## 2015-09-06 DIAGNOSIS — R55 Syncope and collapse: Secondary | ICD-10-CM | POA: Diagnosis not present

## 2015-09-06 NOTE — Progress Notes (Signed)
Urine drug screen for this encounter is consistent for prescribed medication 

## 2015-09-25 ENCOUNTER — Encounter: Payer: Self-pay | Admitting: Registered Nurse

## 2015-09-25 ENCOUNTER — Encounter: Payer: Medicare Other | Attending: Physical Medicine & Rehabilitation | Admitting: Registered Nurse

## 2015-09-25 VITALS — BP 103/59 | HR 92 | Resp 14

## 2015-09-25 DIAGNOSIS — Z760 Encounter for issue of repeat prescription: Secondary | ICD-10-CM | POA: Diagnosis not present

## 2015-09-25 DIAGNOSIS — F329 Major depressive disorder, single episode, unspecified: Secondary | ICD-10-CM | POA: Diagnosis not present

## 2015-09-25 DIAGNOSIS — G90A Postural orthostatic tachycardia syndrome (POTS): Secondary | ICD-10-CM

## 2015-09-25 DIAGNOSIS — R51 Headache: Secondary | ICD-10-CM | POA: Diagnosis not present

## 2015-09-25 DIAGNOSIS — F419 Anxiety disorder, unspecified: Secondary | ICD-10-CM | POA: Insufficient documentation

## 2015-09-25 DIAGNOSIS — K589 Irritable bowel syndrome without diarrhea: Secondary | ICD-10-CM | POA: Diagnosis not present

## 2015-09-25 DIAGNOSIS — I951 Orthostatic hypotension: Secondary | ICD-10-CM | POA: Diagnosis not present

## 2015-09-25 DIAGNOSIS — K219 Gastro-esophageal reflux disease without esophagitis: Secondary | ICD-10-CM | POA: Insufficient documentation

## 2015-09-25 DIAGNOSIS — M791 Myalgia: Secondary | ICD-10-CM

## 2015-09-25 DIAGNOSIS — M542 Cervicalgia: Secondary | ICD-10-CM | POA: Insufficient documentation

## 2015-09-25 DIAGNOSIS — R55 Syncope and collapse: Secondary | ICD-10-CM | POA: Insufficient documentation

## 2015-09-25 DIAGNOSIS — M5136 Other intervertebral disc degeneration, lumbar region: Secondary | ICD-10-CM | POA: Insufficient documentation

## 2015-09-25 DIAGNOSIS — Z5181 Encounter for therapeutic drug level monitoring: Secondary | ICD-10-CM

## 2015-09-25 DIAGNOSIS — Z87891 Personal history of nicotine dependence: Secondary | ICD-10-CM | POA: Insufficient documentation

## 2015-09-25 DIAGNOSIS — M47817 Spondylosis without myelopathy or radiculopathy, lumbosacral region: Secondary | ICD-10-CM | POA: Diagnosis not present

## 2015-09-25 DIAGNOSIS — Z599 Problem related to housing and economic circumstances, unspecified: Secondary | ICD-10-CM | POA: Insufficient documentation

## 2015-09-25 DIAGNOSIS — IMO0001 Reserved for inherently not codable concepts without codable children: Secondary | ICD-10-CM

## 2015-09-25 DIAGNOSIS — R000 Tachycardia, unspecified: Secondary | ICD-10-CM | POA: Diagnosis not present

## 2015-09-25 DIAGNOSIS — K222 Esophageal obstruction: Secondary | ICD-10-CM | POA: Insufficient documentation

## 2015-09-25 DIAGNOSIS — K0889 Other specified disorders of teeth and supporting structures: Secondary | ICD-10-CM | POA: Diagnosis not present

## 2015-09-25 DIAGNOSIS — M797 Fibromyalgia: Secondary | ICD-10-CM | POA: Diagnosis not present

## 2015-09-25 DIAGNOSIS — M609 Myositis, unspecified: Secondary | ICD-10-CM

## 2015-09-25 DIAGNOSIS — R002 Palpitations: Secondary | ICD-10-CM | POA: Diagnosis not present

## 2015-09-25 DIAGNOSIS — G8929 Other chronic pain: Secondary | ICD-10-CM | POA: Insufficient documentation

## 2015-09-25 DIAGNOSIS — Z79899 Other long term (current) drug therapy: Secondary | ICD-10-CM

## 2015-09-25 DIAGNOSIS — K449 Diaphragmatic hernia without obstruction or gangrene: Secondary | ICD-10-CM | POA: Insufficient documentation

## 2015-09-25 DIAGNOSIS — K648 Other hemorrhoids: Secondary | ICD-10-CM | POA: Diagnosis not present

## 2015-09-25 DIAGNOSIS — G894 Chronic pain syndrome: Secondary | ICD-10-CM

## 2015-09-25 DIAGNOSIS — M545 Low back pain: Secondary | ICD-10-CM | POA: Diagnosis not present

## 2015-09-25 DIAGNOSIS — Z86010 Personal history of colonic polyps: Secondary | ICD-10-CM | POA: Diagnosis not present

## 2015-09-25 DIAGNOSIS — M47892 Other spondylosis, cervical region: Secondary | ICD-10-CM | POA: Insufficient documentation

## 2015-09-25 MED ORDER — HYDROCODONE-ACETAMINOPHEN 10-325 MG PO TABS: 2.0000 | ORAL_TABLET | Freq: Three times a day (TID) | ORAL | Status: DC | PRN

## 2015-09-25 NOTE — Progress Notes (Signed)
Subjective:    Patient ID: [name], female    DOB: 1975-09-25, 40 y.o.   MRN: [id number]  HPI: [name] is a 40 year old female who returns for follow up for chronic pain and medication refill. She says her pain is located in her right shoulder and mid-lower back. She rates her pain 6. Her current exercise regime is walking.  [name] has a follow up appointment with Dr. Marvel Plan on 09/27/15 regarding the results of her Zio Patch monitoring she states.  Pain Inventory Average Pain 7 Pain Right Now 6 My pain is sharp, burning, dull, stabbing, tingling and aching  In the last 24 hours, has pain interfered with the following? General activity 7 Relation with others 8 Enjoyment of life 8 What TIME of day is your pain at its worst? morning, evening, night Sleep (in general) Fair  Pain is worse with: walking, bending, sitting, inactivity and standing Pain improves with: rest, heat/ice, medication and TENS Relief from Meds: 7  Mobility walk without assistance how many minutes can you walk? 15 ability to climb steps?  yes do you drive?  yes  Function disabled: date disabled .  Neuro/Psych weakness numbness tremor tingling trouble walking spasms dizziness confusion depression  Prior Studies Any changes since last visit?  no  Physicians involved in your care Any changes since last visit?  no   Family History  Problem Relation Age of Onset  . Diabetes type II Mother   . Colon polyps Mother   . Diverticulitis Mother   . Diabetes Mother   . Heart disease Mother   . Diabetes type II Brother   . Uterine cancer    . Ovarian cancer     Social History   Social History  . Marital Status: Married    Spouse Name: N/A  . Number of Children: 2  . Years of Education: N/A   Occupational History  . Disability    Social History Main Topics  . Smoking status: Former Smoker    Types: Cigarettes    Quit date: 06/30/2011  . Smokeless tobacco:  Never Used  . Alcohol Use: No  . Drug Use: No  . Sexual Activity: Yes   Other Topics Concern  . None   Social History Narrative   Past Surgical History  Procedure Laterality Date  . Cesarean section    . Partial hysterectomy    . Tonsillectomy    . Left elbow surgery     Past Medical History  Diagnosis Date  . Fibromyalgia   . Anxiety   . Depression   . Degenerative disc disease     Spinal, small syrinx  . Chronic pain     Dr. Letta Pate  . GERD (gastroesophageal reflux disease)   . Esophageal stricture     Status post dilatation  . Syncope     Negative tilt table test  . Palpitations     No documented arrhythmias  . Hiatal hernia   . Internal hemorrhoids without mention of complication   . Personal history of colonic polyps 10/04/2000    HYPERPLASTIC POLYP  . Irritable bowel syndrome   . POTS (postural orthostatic tachycardia syndrome)    BP 103/59 mmHg  Pulse 92  Resp 14  SpO2 100%  Opioid Risk Score:   Fall Risk Score:  `1  Depression screen PHQ 2/9  Depression screen [facility] 2/9 04/23/2015 03/26/2015 02/22/2015 10/22/2014  Decreased Interest 1 1 1 2   Down, Depressed, Hopeless 1  1 1 3   PHQ - 2 Score 2 2 2 5   Altered sleeping - - - 2  Tired, decreased energy - - - 3  Change in appetite - - - 0  Feeling bad or failure about yourself  - - - 2  Trouble concentrating - - - 1  Moving slowly or fidgety/restless - - - 2  Suicidal thoughts - - - 0  PHQ-9 Score - - - 15     Review of Systems  Constitutional: Positive for chills and appetite change.  Respiratory: Positive for shortness of breath.   Gastrointestinal: Positive for nausea, diarrhea and constipation.  Skin: Positive for rash.  All other systems reviewed and are negative.      Objective:   Physical Exam  Constitutional: She is oriented to person, place, and time. She appears well-developed and well-nourished.  HENT:  Head: Normocephalic and atraumatic.  Neck: Normal range of motion. Neck supple.   Cardiovascular: Normal rate and regular rhythm.   Pulmonary/Chest: Effort normal and breath sounds normal.  Musculoskeletal:  Normal Muscle Bulk and Muscle Testing Reveals: Upper Extremities: Full ROM and Muscle Strength 5/5 Back without spinal or paraspinal tenderness Lower Extremities: Full ROM and Muscle Strength 5/5 Arises from chair with ease Narrow Based Gait  Neurological: She is alert and oriented to person, place, and time.  Skin: Skin is warm and dry.  Psychiatric: She has a normal mood and affect.  Nursing note and vitals reviewed.         Assessment & Plan:  1. 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  3. Fibromyalgia: Continue with Heat and exercise therapy.  4.Depression and anxiety: Psychiatry Following  5. Orthostatic hypotension: .Cardiology Following  6. Chronic Headaches: Continue Topamax 25 mg BID  30 minutes of face to face patient care time was spent during this visit. All questions were encouraged and answered.

## 2015-09-27 DIAGNOSIS — R42 Dizziness and giddiness: Secondary | ICD-10-CM | POA: Diagnosis not present

## 2015-09-27 DIAGNOSIS — R55 Syncope and collapse: Secondary | ICD-10-CM | POA: Diagnosis not present

## 2015-09-27 DIAGNOSIS — I493 Ventricular premature depolarization: Secondary | ICD-10-CM | POA: Diagnosis not present

## 2015-09-27 DIAGNOSIS — Z87891 Personal history of nicotine dependence: Secondary | ICD-10-CM | POA: Diagnosis not present

## 2015-09-27 DIAGNOSIS — R000 Tachycardia, unspecified: Secondary | ICD-10-CM | POA: Diagnosis not present

## 2015-09-27 DIAGNOSIS — R5383 Other fatigue: Secondary | ICD-10-CM | POA: Diagnosis not present

## 2015-09-27 DIAGNOSIS — I498 Other specified cardiac arrhythmias: Secondary | ICD-10-CM | POA: Diagnosis not present

## 2015-09-27 DIAGNOSIS — D6489 Other specified anemias: Secondary | ICD-10-CM | POA: Diagnosis not present

## 2015-10-23 DIAGNOSIS — Z79899 Other long term (current) drug therapy: Secondary | ICD-10-CM | POA: Diagnosis not present

## 2015-10-23 DIAGNOSIS — Z5181 Encounter for therapeutic drug level monitoring: Secondary | ICD-10-CM | POA: Diagnosis not present

## 2015-10-24 ENCOUNTER — Encounter: Payer: Medicare Other | Admitting: Registered Nurse

## 2015-10-28 ENCOUNTER — Encounter: Payer: Self-pay | Admitting: Registered Nurse

## 2015-10-28 ENCOUNTER — Encounter: Payer: Medicare Other | Attending: Physical Medicine & Rehabilitation | Admitting: Registered Nurse

## 2015-10-28 VITALS — BP 129/79 | HR 100

## 2015-10-28 DIAGNOSIS — K222 Esophageal obstruction: Secondary | ICD-10-CM | POA: Diagnosis not present

## 2015-10-28 DIAGNOSIS — R002 Palpitations: Secondary | ICD-10-CM | POA: Insufficient documentation

## 2015-10-28 DIAGNOSIS — M545 Low back pain: Secondary | ICD-10-CM | POA: Diagnosis not present

## 2015-10-28 DIAGNOSIS — G894 Chronic pain syndrome: Secondary | ICD-10-CM

## 2015-10-28 DIAGNOSIS — M797 Fibromyalgia: Secondary | ICD-10-CM | POA: Diagnosis not present

## 2015-10-28 DIAGNOSIS — R51 Headache: Secondary | ICD-10-CM | POA: Diagnosis not present

## 2015-10-28 DIAGNOSIS — Z87891 Personal history of nicotine dependence: Secondary | ICD-10-CM | POA: Diagnosis not present

## 2015-10-28 DIAGNOSIS — F329 Major depressive disorder, single episode, unspecified: Secondary | ICD-10-CM | POA: Insufficient documentation

## 2015-10-28 DIAGNOSIS — Z79899 Other long term (current) drug therapy: Secondary | ICD-10-CM

## 2015-10-28 DIAGNOSIS — Z599 Problem related to housing and economic circumstances, unspecified: Secondary | ICD-10-CM | POA: Diagnosis not present

## 2015-10-28 DIAGNOSIS — G90A Postural orthostatic tachycardia syndrome (POTS): Secondary | ICD-10-CM

## 2015-10-28 DIAGNOSIS — K449 Diaphragmatic hernia without obstruction or gangrene: Secondary | ICD-10-CM | POA: Insufficient documentation

## 2015-10-28 DIAGNOSIS — R000 Tachycardia, unspecified: Secondary | ICD-10-CM | POA: Diagnosis not present

## 2015-10-28 DIAGNOSIS — M542 Cervicalgia: Secondary | ICD-10-CM | POA: Diagnosis not present

## 2015-10-28 DIAGNOSIS — R55 Syncope and collapse: Secondary | ICD-10-CM | POA: Diagnosis not present

## 2015-10-28 DIAGNOSIS — K589 Irritable bowel syndrome without diarrhea: Secondary | ICD-10-CM | POA: Diagnosis not present

## 2015-10-28 DIAGNOSIS — IMO0001 Reserved for inherently not codable concepts without codable children: Secondary | ICD-10-CM

## 2015-10-28 DIAGNOSIS — M47817 Spondylosis without myelopathy or radiculopathy, lumbosacral region: Secondary | ICD-10-CM | POA: Diagnosis not present

## 2015-10-28 DIAGNOSIS — K0889 Other specified disorders of teeth and supporting structures: Secondary | ICD-10-CM | POA: Diagnosis not present

## 2015-10-28 DIAGNOSIS — Z86010 Personal history of colonic polyps: Secondary | ICD-10-CM | POA: Insufficient documentation

## 2015-10-28 DIAGNOSIS — G8929 Other chronic pain: Secondary | ICD-10-CM | POA: Diagnosis not present

## 2015-10-28 DIAGNOSIS — Z760 Encounter for issue of repeat prescription: Secondary | ICD-10-CM | POA: Insufficient documentation

## 2015-10-28 DIAGNOSIS — M47892 Other spondylosis, cervical region: Secondary | ICD-10-CM | POA: Insufficient documentation

## 2015-10-28 DIAGNOSIS — M609 Myositis, unspecified: Secondary | ICD-10-CM

## 2015-10-28 DIAGNOSIS — F419 Anxiety disorder, unspecified: Secondary | ICD-10-CM | POA: Diagnosis not present

## 2015-10-28 DIAGNOSIS — K219 Gastro-esophageal reflux disease without esophagitis: Secondary | ICD-10-CM | POA: Diagnosis not present

## 2015-10-28 DIAGNOSIS — Z5181 Encounter for therapeutic drug level monitoring: Secondary | ICD-10-CM

## 2015-10-28 DIAGNOSIS — M5136 Other intervertebral disc degeneration, lumbar region: Secondary | ICD-10-CM | POA: Insufficient documentation

## 2015-10-28 DIAGNOSIS — M791 Myalgia: Secondary | ICD-10-CM | POA: Diagnosis not present

## 2015-10-28 DIAGNOSIS — K648 Other hemorrhoids: Secondary | ICD-10-CM | POA: Insufficient documentation

## 2015-10-28 DIAGNOSIS — I951 Orthostatic hypotension: Secondary | ICD-10-CM | POA: Insufficient documentation

## 2015-10-28 MED ORDER — HYDROCODONE-ACETAMINOPHEN 10-325 MG PO TABS: 2.0000 | ORAL_TABLET | Freq: Three times a day (TID) | ORAL | Status: DC | PRN

## 2015-10-28 NOTE — Progress Notes (Signed)
Subjective:    Patient ID: [name], female    DOB: Oct 01, 1975, 40 y.o.   MRN: [id number]  HPI: [name] is a 40 year old female who returns for follow up for chronic pain and medication refill. She states her pain is located in her neck and lower back. She rates her pain 6. Her current exercise regime is walking.   Also states "Dr. Marvel Plan  will be scheduling her  for Nuclear Stress test based on her Zio Patch results. Very tearful today, her aunt has passed away, emotional support given.   Pain Inventory Average Pain 7 Pain Right Now 6 My pain is intermittent, sharp, burning, dull, stabbing, tingling and aching  In the last 24 hours, has pain interfered with the following? General activity 7 Relation with others 8 Enjoyment of life 8 What TIME of day is your pain at its worst? no selection Sleep (in general) Fair  Pain is worse with: walking, bending, sitting, inactivity, standing and some activites Pain improves with: rest, heat/ice, medication and TENS Relief from Meds: 7  Mobility walk without assistance how many minutes can you walk? 15-20 ability to climb steps?  yes do you drive?  yes needs help with transfers Do you have any goals in this area?  yes  Function disabled: date disabled . I need assistance with the following:  household duties and shopping  Neuro/Psych weakness numbness tremor tingling spasms dizziness confusion depression anxiety  Prior Studies Any changes since last visit?  no  Physicians involved in your care Any changes since last visit?  no   Family History  Problem Relation Age of Onset  . Diabetes type II Mother   . Colon polyps Mother   . Diverticulitis Mother   . Diabetes Mother   . Heart disease Mother   . Diabetes type II Brother   . Uterine cancer    . Ovarian cancer     Social History   Social History  . Marital Status: Married    Spouse Name: N/A  . Number of Children: 2  . Years of  Education: N/A   Occupational History  . Disability    Social History Main Topics  . Smoking status: Former Smoker    Types: Cigarettes    Quit date: 06/30/2011  . Smokeless tobacco: Never Used  . Alcohol Use: No  . Drug Use: No  . Sexual Activity: Yes   Other Topics Concern  . None   Social History Narrative   Past Surgical History  Procedure Laterality Date  . Cesarean section    . Partial hysterectomy    . Tonsillectomy    . Left elbow surgery     Past Medical History  Diagnosis Date  . Fibromyalgia   . Anxiety   . Depression   . Degenerative disc disease     Spinal, small syrinx  . Chronic pain     Dr. Letta Pate  . GERD (gastroesophageal reflux disease)   . Esophageal stricture     Status post dilatation  . Syncope     Negative tilt table test  . Palpitations     No documented arrhythmias  . Hiatal hernia   . Internal hemorrhoids without mention of complication   . Personal history of colonic polyps 10/04/2000    HYPERPLASTIC POLYP  . Irritable bowel syndrome   . POTS (postural orthostatic tachycardia syndrome)    BP 129/79 mmHg  Pulse 100  SpO2 100%  Opioid Risk Score:  Fall Risk Score:  `1  Depression screen PHQ 2/9  Depression screen [facility] 2/9 04/23/2015 03/26/2015 02/22/2015 10/22/2014  Decreased Interest 1 1 1 2   Down, Depressed, Hopeless 1 1 1 3   PHQ - 2 Score 2 2 2 5   Altered sleeping - - - 2  Tired, decreased energy - - - 3  Change in appetite - - - 0  Feeling bad or failure about yourself  - - - 2  Trouble concentrating - - - 1  Moving slowly or fidgety/restless - - - 2  Suicidal thoughts - - - 0  PHQ-9 Score - - - 15      Review of Systems  All other systems reviewed and are negative.      Objective:   Physical Exam  Constitutional: She is oriented to person, place, and time. She appears well-developed and well-nourished.  HENT:  Head: Normocephalic and atraumatic.  Neck: Normal range of motion. Neck supple.  Cardiovascular:  Normal rate and regular rhythm.   Pulmonary/Chest: Effort normal and breath sounds normal.  Musculoskeletal:  Normal Muscle Bulk and Muscle Testing Reveals: Upper Extremities: Full ROM and Muscle Strength 5/5 Lumbar Paraspinal Tenderness: L-3- L-5 Lower Extremities: Full ROM and Muscle Strength 5/5 Arises from chair with ease Narrow Based Gait   Neurological: She is alert and oriented to person, place, and time.  Skin: Skin is warm and dry.  Psychiatric: She has a normal mood and affect.  Nursing note and vitals reviewed.         Assessment & Plan:  1. 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  3. Fibromyalgia: Continue with Heat and exercise therapy.  4.Depression and anxiety: Psychiatry Following  5. Orthostatic hypotension: .Cardiology Following  6. Chronic Headaches: Continue Topamax 25 mg BID  20 minutes of face to face patient care time was spent during this visit. All questions were encouraged and answered.

## 2015-11-28 ENCOUNTER — Encounter: Payer: Medicare Other | Admitting: Registered Nurse

## 2015-12-02 ENCOUNTER — Encounter: Payer: Medicare Other | Attending: Physical Medicine & Rehabilitation | Admitting: Registered Nurse

## 2015-12-02 ENCOUNTER — Encounter: Payer: Self-pay | Admitting: Registered Nurse

## 2015-12-02 VITALS — BP 101/70 | HR 95 | Resp 14

## 2015-12-02 DIAGNOSIS — K648 Other hemorrhoids: Secondary | ICD-10-CM | POA: Diagnosis not present

## 2015-12-02 DIAGNOSIS — F329 Major depressive disorder, single episode, unspecified: Secondary | ICD-10-CM | POA: Diagnosis not present

## 2015-12-02 DIAGNOSIS — R55 Syncope and collapse: Secondary | ICD-10-CM | POA: Insufficient documentation

## 2015-12-02 DIAGNOSIS — R000 Tachycardia, unspecified: Secondary | ICD-10-CM | POA: Diagnosis not present

## 2015-12-02 DIAGNOSIS — Z86010 Personal history of colonic polyps: Secondary | ICD-10-CM | POA: Insufficient documentation

## 2015-12-02 DIAGNOSIS — M797 Fibromyalgia: Secondary | ICD-10-CM | POA: Diagnosis not present

## 2015-12-02 DIAGNOSIS — G90A Postural orthostatic tachycardia syndrome (POTS): Secondary | ICD-10-CM

## 2015-12-02 DIAGNOSIS — K589 Irritable bowel syndrome without diarrhea: Secondary | ICD-10-CM | POA: Insufficient documentation

## 2015-12-02 DIAGNOSIS — M5136 Other intervertebral disc degeneration, lumbar region: Secondary | ICD-10-CM | POA: Diagnosis not present

## 2015-12-02 DIAGNOSIS — F419 Anxiety disorder, unspecified: Secondary | ICD-10-CM | POA: Diagnosis not present

## 2015-12-02 DIAGNOSIS — G8929 Other chronic pain: Secondary | ICD-10-CM | POA: Insufficient documentation

## 2015-12-02 DIAGNOSIS — M47817 Spondylosis without myelopathy or radiculopathy, lumbosacral region: Secondary | ICD-10-CM | POA: Diagnosis not present

## 2015-12-02 DIAGNOSIS — M791 Myalgia: Secondary | ICD-10-CM

## 2015-12-02 DIAGNOSIS — K0889 Other specified disorders of teeth and supporting structures: Secondary | ICD-10-CM | POA: Insufficient documentation

## 2015-12-02 DIAGNOSIS — M542 Cervicalgia: Secondary | ICD-10-CM | POA: Insufficient documentation

## 2015-12-02 DIAGNOSIS — Z599 Problem related to housing and economic circumstances, unspecified: Secondary | ICD-10-CM | POA: Insufficient documentation

## 2015-12-02 DIAGNOSIS — R002 Palpitations: Secondary | ICD-10-CM | POA: Diagnosis not present

## 2015-12-02 DIAGNOSIS — K219 Gastro-esophageal reflux disease without esophagitis: Secondary | ICD-10-CM | POA: Diagnosis not present

## 2015-12-02 DIAGNOSIS — I951 Orthostatic hypotension: Secondary | ICD-10-CM | POA: Insufficient documentation

## 2015-12-02 DIAGNOSIS — M545 Low back pain: Secondary | ICD-10-CM | POA: Diagnosis not present

## 2015-12-02 DIAGNOSIS — Z87891 Personal history of nicotine dependence: Secondary | ICD-10-CM | POA: Insufficient documentation

## 2015-12-02 DIAGNOSIS — G894 Chronic pain syndrome: Secondary | ICD-10-CM

## 2015-12-02 DIAGNOSIS — K449 Diaphragmatic hernia without obstruction or gangrene: Secondary | ICD-10-CM | POA: Insufficient documentation

## 2015-12-02 DIAGNOSIS — Z760 Encounter for issue of repeat prescription: Secondary | ICD-10-CM | POA: Insufficient documentation

## 2015-12-02 DIAGNOSIS — Z79899 Other long term (current) drug therapy: Secondary | ICD-10-CM

## 2015-12-02 DIAGNOSIS — IMO0001 Reserved for inherently not codable concepts without codable children: Secondary | ICD-10-CM

## 2015-12-02 DIAGNOSIS — K222 Esophageal obstruction: Secondary | ICD-10-CM | POA: Insufficient documentation

## 2015-12-02 DIAGNOSIS — R51 Headache: Secondary | ICD-10-CM | POA: Diagnosis not present

## 2015-12-02 DIAGNOSIS — M609 Myositis, unspecified: Secondary | ICD-10-CM

## 2015-12-02 DIAGNOSIS — M47892 Other spondylosis, cervical region: Secondary | ICD-10-CM | POA: Insufficient documentation

## 2015-12-02 DIAGNOSIS — Z5181 Encounter for therapeutic drug level monitoring: Secondary | ICD-10-CM

## 2015-12-02 MED ORDER — HYDROCODONE-ACETAMINOPHEN 10-325 MG PO TABS: 2.0000 | ORAL_TABLET | Freq: Three times a day (TID) | ORAL | Status: DC | PRN

## 2015-12-02 NOTE — Progress Notes (Signed)
Subjective:    Patient ID: [name], female    DOB: Jun 19, 1976, 40 y.o.   MRN: [id number]  HPI: [name] is a 40 year old female who returns for follow up for chronic pain and medication refill. She states her pain is located in her lower back and complaining about joint pain. She rates her pain 7. Her current exercise regime is walking and performing stretching exercises.  Pain Inventory Average Pain 6 Pain Right Now 7 My pain is constant, sharp, burning, dull, stabbing, tingling and aching  In the last 24 hours, has pain interfered with the following? General activity 8 Relation with others 8 Enjoyment of life 8 What TIME of day is your pain at its worst? all Sleep (in general) Fair  Pain is worse with: walking, bending, sitting, inactivity and standing Pain improves with: rest, heat/ice, therapy/exercise, medication and TENS Relief from Meds: 6  Mobility walk without assistance how many minutes can you walk? 15 ability to climb steps?  yes do you drive?  yes  Function disabled: date disabled .  Neuro/Psych weakness numbness tremor tingling spasms dizziness confusion depression anxiety  Prior Studies Any changes since last visit?  no  Physicians involved in your care Any changes since last visit?  no   Family History  Problem Relation Age of Onset  . Diabetes type II Mother   . Colon polyps Mother   . Diverticulitis Mother   . Diabetes Mother   . Heart disease Mother   . Diabetes type II Brother   . Uterine cancer    . Ovarian cancer     Social History   Social History  . Marital Status: Married    Spouse Name: N/A  . Number of Children: 2  . Years of Education: N/A   Occupational History  . Disability    Social History Main Topics  . Smoking status: Former Smoker    Types: Cigarettes    Quit date: 06/30/2011  . Smokeless tobacco: Never Used  . Alcohol Use: No  . Drug Use: No  . Sexual Activity: Yes   Other Topics  Concern  . None   Social History Narrative   Past Surgical History  Procedure Laterality Date  . Cesarean section    . Partial hysterectomy    . Tonsillectomy    . Left elbow surgery     Past Medical History  Diagnosis Date  . Fibromyalgia   . Anxiety   . Depression   . Degenerative disc disease     Spinal, small syrinx  . Chronic pain     Dr. Letta Pate  . GERD (gastroesophageal reflux disease)   . Esophageal stricture     Status post dilatation  . Syncope     Negative tilt table test  . Palpitations     No documented arrhythmias  . Hiatal hernia   . Internal hemorrhoids without mention of complication   . Personal history of colonic polyps 10/04/2000    HYPERPLASTIC POLYP  . Irritable bowel syndrome   . POTS (postural orthostatic tachycardia syndrome)    BP 101/70 mmHg  Pulse 95  Resp 14  SpO2 100%  Opioid Risk Score:   Fall Risk Score:  `1  Depression screen PHQ 2/9  Depression screen [facility] 2/9 04/23/2015 03/26/2015 02/22/2015 10/22/2014  Decreased Interest 1 1 1 2   Down, Depressed, Hopeless 1 1 1 3   PHQ - 2 Score 2 2 2 5   Altered sleeping - - -  2  Tired, decreased energy - - - 3  Change in appetite - - - 0  Feeling bad or failure about yourself  - - - 2  Trouble concentrating - - - 1  Moving slowly or fidgety/restless - - - 2  Suicidal thoughts - - - 0  PHQ-9 Score - - - 15     Review of Systems  All other systems reviewed and are negative.      Objective:   Physical Exam  Constitutional: She is oriented to person, place, and time. She appears well-developed and well-nourished.  HENT:  Head: Normocephalic and atraumatic.  Neck: Normal range of motion. Neck supple.  Cardiovascular: Normal rate and regular rhythm.   Pulmonary/Chest: Effort normal and breath sounds normal.  Musculoskeletal:  Normal Muscle Bulk and Muscle Testing Reveals: Upper Extremities: Full ROM and Muscle Strength 5/5 Lumbar Paraspinal Tenderness: L-3- L-5 Lower Extremities:  Full ROM and Muscle Strength 5/5 Arises from chair with ease Narrow Based Gait  Neurological: She is alert and oriented to person, place, and time.  Skin: Skin is warm and dry.  Psychiatric: She has a normal mood and affect.  Nursing note and vitals reviewed.         Assessment & Plan:  1. Lumbar degenerative disc disease:  Refilled: HYDROcodone 10/325 mg two tablets every 8 hours as needed #180. We will continue the opioid monitoring program, this consists of regular clinic visits, examinations, urine drug screen, pill counts as well as use of [place] Controlled Substance Reporting System.  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  3. Fibromyalgia: Continue with Heat and exercise therapy.  4.Depression and anxiety: Psychiatry Following  5. Orthostatic hypotension: .Cardiology Following  6. Chronic Headaches: Continue Topamax 25 mg BID  20 minutes of face to face patient care time was spent during this visit. All questions were encouraged and answered.

## 2015-12-07 LAB — TOXASSURE SELECT,+ANTIDEPR,UR

## 2015-12-26 DIAGNOSIS — Z5181 Encounter for therapeutic drug level monitoring: Secondary | ICD-10-CM | POA: Diagnosis not present

## 2015-12-26 DIAGNOSIS — Z79899 Other long term (current) drug therapy: Secondary | ICD-10-CM | POA: Diagnosis not present

## 2015-12-26 NOTE — Progress Notes (Signed)
Urine drug screen for this encounter is inconsistent for prescribed medications. Will repeat UDS next visit.

## 2015-12-31 ENCOUNTER — Encounter: Payer: Self-pay | Admitting: Registered Nurse

## 2015-12-31 ENCOUNTER — Encounter (HOSPITAL_BASED_OUTPATIENT_CLINIC_OR_DEPARTMENT_OTHER): Payer: Medicare Other | Admitting: Registered Nurse

## 2015-12-31 VITALS — BP 106/67 | HR 80 | Resp 14

## 2015-12-31 DIAGNOSIS — R51 Headache: Secondary | ICD-10-CM | POA: Diagnosis not present

## 2015-12-31 DIAGNOSIS — M5136 Other intervertebral disc degeneration, lumbar region: Secondary | ICD-10-CM | POA: Diagnosis not present

## 2015-12-31 DIAGNOSIS — Z599 Problem related to housing and economic circumstances, unspecified: Secondary | ICD-10-CM | POA: Diagnosis not present

## 2015-12-31 DIAGNOSIS — G894 Chronic pain syndrome: Secondary | ICD-10-CM

## 2015-12-31 DIAGNOSIS — K648 Other hemorrhoids: Secondary | ICD-10-CM | POA: Diagnosis not present

## 2015-12-31 DIAGNOSIS — M609 Myositis, unspecified: Secondary | ICD-10-CM

## 2015-12-31 DIAGNOSIS — K589 Irritable bowel syndrome without diarrhea: Secondary | ICD-10-CM | POA: Diagnosis not present

## 2015-12-31 DIAGNOSIS — M791 Myalgia: Secondary | ICD-10-CM

## 2015-12-31 DIAGNOSIS — K219 Gastro-esophageal reflux disease without esophagitis: Secondary | ICD-10-CM | POA: Diagnosis not present

## 2015-12-31 DIAGNOSIS — I951 Orthostatic hypotension: Secondary | ICD-10-CM | POA: Diagnosis not present

## 2015-12-31 DIAGNOSIS — R002 Palpitations: Secondary | ICD-10-CM | POA: Diagnosis not present

## 2015-12-31 DIAGNOSIS — M797 Fibromyalgia: Secondary | ICD-10-CM | POA: Diagnosis not present

## 2015-12-31 DIAGNOSIS — M542 Cervicalgia: Secondary | ICD-10-CM | POA: Diagnosis not present

## 2015-12-31 DIAGNOSIS — M47817 Spondylosis without myelopathy or radiculopathy, lumbosacral region: Secondary | ICD-10-CM | POA: Diagnosis not present

## 2015-12-31 DIAGNOSIS — K449 Diaphragmatic hernia without obstruction or gangrene: Secondary | ICD-10-CM | POA: Diagnosis not present

## 2015-12-31 DIAGNOSIS — K0889 Other specified disorders of teeth and supporting structures: Secondary | ICD-10-CM | POA: Diagnosis not present

## 2015-12-31 DIAGNOSIS — Z760 Encounter for issue of repeat prescription: Secondary | ICD-10-CM | POA: Diagnosis not present

## 2015-12-31 DIAGNOSIS — K222 Esophageal obstruction: Secondary | ICD-10-CM | POA: Diagnosis not present

## 2015-12-31 DIAGNOSIS — M47892 Other spondylosis, cervical region: Secondary | ICD-10-CM | POA: Diagnosis not present

## 2015-12-31 DIAGNOSIS — G8929 Other chronic pain: Secondary | ICD-10-CM | POA: Diagnosis not present

## 2015-12-31 DIAGNOSIS — Z87891 Personal history of nicotine dependence: Secondary | ICD-10-CM | POA: Diagnosis not present

## 2015-12-31 DIAGNOSIS — R55 Syncope and collapse: Secondary | ICD-10-CM | POA: Diagnosis not present

## 2015-12-31 DIAGNOSIS — M545 Low back pain: Secondary | ICD-10-CM | POA: Diagnosis not present

## 2015-12-31 DIAGNOSIS — R000 Tachycardia, unspecified: Secondary | ICD-10-CM | POA: Diagnosis not present

## 2015-12-31 DIAGNOSIS — Z5181 Encounter for therapeutic drug level monitoring: Secondary | ICD-10-CM

## 2015-12-31 DIAGNOSIS — Z79899 Other long term (current) drug therapy: Secondary | ICD-10-CM

## 2015-12-31 DIAGNOSIS — Z86010 Personal history of colonic polyps: Secondary | ICD-10-CM | POA: Diagnosis not present

## 2015-12-31 DIAGNOSIS — IMO0001 Reserved for inherently not codable concepts without codable children: Secondary | ICD-10-CM

## 2015-12-31 MED ORDER — HYDROCODONE-ACETAMINOPHEN 10-325 MG PO TABS: 2.0000 | ORAL_TABLET | Freq: Three times a day (TID) | ORAL | Status: DC | PRN

## 2015-12-31 NOTE — Progress Notes (Signed)
Subjective:    Patient ID: [name], female    DOB: Mar 11, 1976, 40 y.o.   MRN: [id number]  HPI: [name] is a 40 year old female who returns for follow up for chronic pain and medication refill. She states her pain is located in her right shoulder, left wrist and mid- lower back. She rates her pain 7. Her current exercise regime is walking and performing stretching exercises.  Pain Inventory Average Pain 8 Pain Right Now 7 My pain is intermittent, sharp, burning, dull, stabbing, tingling and aching  In the last 24 hours, has pain interfered with the following? General activity 7 Relation with others 7 Enjoyment of life 7 What TIME of day is your pain at its worst? evening Sleep (in general) Fair  Pain is worse with: bending, inactivity, standing and some activites Pain improves with: rest, therapy/exercise, medication and TENS Relief from Meds: 6  Mobility walk without assistance how many minutes can you walk? 15 ability to climb steps?  yes do you drive?  yes needs help with transfers  Function disabled: date disabled NA  Neuro/Psych weakness numbness tremor tingling trouble walking spasms dizziness confusion depression anxiety  Prior Studies Any changes since last visit?  yes  Physicians involved in your care Any changes since last visit?  no   Family History  Problem Relation Age of Onset  . Diabetes type II Mother   . Colon polyps Mother   . Diverticulitis Mother   . Diabetes Mother   . Heart disease Mother   . Diabetes type II Brother   . Uterine cancer    . Ovarian cancer     Social History   Social History  . Marital Status: Married    Spouse Name: N/A  . Number of Children: 2  . Years of Education: N/A   Occupational History  . Disability    Social History Main Topics  . Smoking status: Former Smoker    Types: Cigarettes    Quit date: 06/30/2011  . Smokeless tobacco: Never Used  . Alcohol Use: No  . Drug Use:  No  . Sexual Activity: Yes   Other Topics Concern  . None   Social History Narrative   Past Surgical History  Procedure Laterality Date  . Cesarean section    . Partial hysterectomy    . Tonsillectomy    . Left elbow surgery     Past Medical History  Diagnosis Date  . Fibromyalgia   . Anxiety   . Depression   . Degenerative disc disease     Spinal, small syrinx  . Chronic pain     Dr. Letta Pate  . GERD (gastroesophageal reflux disease)   . Esophageal stricture     Status post dilatation  . Syncope     Negative tilt table test  . Palpitations     No documented arrhythmias  . Hiatal hernia   . Internal hemorrhoids without mention of complication   . Personal history of colonic polyps 10/04/2000    HYPERPLASTIC POLYP  . Irritable bowel syndrome   . POTS (postural orthostatic tachycardia syndrome)    BP 106/67 mmHg  Pulse 80  Resp 14  SpO2 97%  Opioid Risk Score:   Fall Risk Score:  `1  Depression screen PHQ 2/9  Depression screen [facility] 2/9 04/23/2015 03/26/2015 02/22/2015 10/22/2014  Decreased Interest 1 1 1 2   Down, Depressed, Hopeless 1 1 1 3   PHQ - 2 Score 2 2 2  5  Altered sleeping - - - 2  Tired, decreased energy - - - 3  Change in appetite - - - 0  Feeling bad or failure about yourself  - - - 2  Trouble concentrating - - - 1  Moving slowly or fidgety/restless - - - 2  Suicidal thoughts - - - 0  PHQ-9 Score - - - 15       Review of Systems  All other systems reviewed and are negative.      Objective:   Physical Exam  Constitutional: She is oriented to person, place, and time. She appears well-developed and well-nourished.  HENT:  Head: Normocephalic and atraumatic.  Neck: Normal range of motion. Neck supple.  Cardiovascular: Normal rate and regular rhythm.   Pulmonary/Chest: Effort normal and breath sounds normal.  Musculoskeletal:  Normal Muscle Bulk and Muscle Testing Reveals: Upper Extremities: Full ROM and Muscle Strength 5/5 Back without  spinal tenderness Lower Extremities: Full ROM and Muscle Strength 5/5 Arises from chair with ease Narrow Based gait  Neurological: She is alert and oriented to person, place, and time.  Skin: Skin is warm and dry.  Psychiatric: She has a normal mood and affect.  Nursing note and vitals reviewed.         Assessment & Plan:  1. Lumbar degenerative disc disease:  Refilled: HYDROcodone 10/325 mg two tablets every 8 hours as needed #180. We will continue the opioid monitoring program, this consists of regular clinic visits, examinations, urine drug screen, pill counts as well as use of [place] Controlled Substance Reporting System.  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  3. Fibromyalgia: Continue with Heat and exercise therapy.  4.Depression and anxiety: Psychiatry Following  5. Orthostatic hypotension: .Cardiology Following  6. Chronic Headaches: Continue Topamax 25 mg BID, she states she takes as needed.  20 minutes of face to face patient care time was spent during this visit. All questions were encouraged and answered.

## 2016-01-27 ENCOUNTER — Ambulatory Visit (HOSPITAL_BASED_OUTPATIENT_CLINIC_OR_DEPARTMENT_OTHER): Payer: Medicare Other | Admitting: Physical Medicine & Rehabilitation

## 2016-01-27 ENCOUNTER — Encounter: Payer: Medicare Other | Attending: Physical Medicine & Rehabilitation

## 2016-01-27 ENCOUNTER — Encounter: Payer: Self-pay | Admitting: Physical Medicine & Rehabilitation

## 2016-01-27 VITALS — BP 114/78 | HR 88 | Resp 14

## 2016-01-27 DIAGNOSIS — Z760 Encounter for issue of repeat prescription: Secondary | ICD-10-CM | POA: Insufficient documentation

## 2016-01-27 DIAGNOSIS — M797 Fibromyalgia: Secondary | ICD-10-CM | POA: Insufficient documentation

## 2016-01-27 DIAGNOSIS — R51 Headache: Secondary | ICD-10-CM | POA: Diagnosis not present

## 2016-01-27 DIAGNOSIS — Z86010 Personal history of colonic polyps: Secondary | ICD-10-CM | POA: Diagnosis not present

## 2016-01-27 DIAGNOSIS — K219 Gastro-esophageal reflux disease without esophagitis: Secondary | ICD-10-CM | POA: Insufficient documentation

## 2016-01-27 DIAGNOSIS — K222 Esophageal obstruction: Secondary | ICD-10-CM | POA: Diagnosis not present

## 2016-01-27 DIAGNOSIS — M47812 Spondylosis without myelopathy or radiculopathy, cervical region: Secondary | ICD-10-CM | POA: Diagnosis not present

## 2016-01-27 DIAGNOSIS — K449 Diaphragmatic hernia without obstruction or gangrene: Secondary | ICD-10-CM | POA: Diagnosis not present

## 2016-01-27 DIAGNOSIS — G8929 Other chronic pain: Secondary | ICD-10-CM | POA: Insufficient documentation

## 2016-01-27 DIAGNOSIS — M5136 Other intervertebral disc degeneration, lumbar region: Secondary | ICD-10-CM | POA: Diagnosis not present

## 2016-01-27 DIAGNOSIS — F419 Anxiety disorder, unspecified: Secondary | ICD-10-CM | POA: Insufficient documentation

## 2016-01-27 DIAGNOSIS — M519 Unspecified thoracic, thoracolumbar and lumbosacral intervertebral disc disorder: Secondary | ICD-10-CM

## 2016-01-27 DIAGNOSIS — K0889 Other specified disorders of teeth and supporting structures: Secondary | ICD-10-CM | POA: Diagnosis not present

## 2016-01-27 DIAGNOSIS — I951 Orthostatic hypotension: Secondary | ICD-10-CM | POA: Insufficient documentation

## 2016-01-27 DIAGNOSIS — M47892 Other spondylosis, cervical region: Secondary | ICD-10-CM | POA: Diagnosis not present

## 2016-01-27 DIAGNOSIS — Z87891 Personal history of nicotine dependence: Secondary | ICD-10-CM | POA: Insufficient documentation

## 2016-01-27 DIAGNOSIS — R000 Tachycardia, unspecified: Secondary | ICD-10-CM | POA: Diagnosis not present

## 2016-01-27 DIAGNOSIS — M542 Cervicalgia: Secondary | ICD-10-CM | POA: Insufficient documentation

## 2016-01-27 DIAGNOSIS — K648 Other hemorrhoids: Secondary | ICD-10-CM | POA: Diagnosis not present

## 2016-01-27 DIAGNOSIS — F329 Major depressive disorder, single episode, unspecified: Secondary | ICD-10-CM | POA: Diagnosis not present

## 2016-01-27 DIAGNOSIS — M4647 Discitis, unspecified, lumbosacral region: Secondary | ICD-10-CM

## 2016-01-27 DIAGNOSIS — M545 Low back pain: Secondary | ICD-10-CM | POA: Diagnosis not present

## 2016-01-27 DIAGNOSIS — R55 Syncope and collapse: Secondary | ICD-10-CM | POA: Diagnosis not present

## 2016-01-27 DIAGNOSIS — Z599 Problem related to housing and economic circumstances, unspecified: Secondary | ICD-10-CM | POA: Diagnosis not present

## 2016-01-27 DIAGNOSIS — K589 Irritable bowel syndrome without diarrhea: Secondary | ICD-10-CM | POA: Insufficient documentation

## 2016-01-27 DIAGNOSIS — R002 Palpitations: Secondary | ICD-10-CM | POA: Insufficient documentation

## 2016-01-27 MED ORDER — HYDROCODONE-ACETAMINOPHEN 10-325 MG PO TABS: 2.0000 | ORAL_TABLET | Freq: Three times a day (TID) | ORAL | Status: DC | PRN

## 2016-01-27 NOTE — Progress Notes (Signed)
Subjective:    Patient ID: [name], female    DOB: 05-25-1976, 40 y.o.   MRN: [id number]  HPI Sees Dr Creta Levin at [facility] outpt may be getting additional Orthostatic testing May be seeing Neurologist to eval for post concussive disorder, hx of falls ~49yr ago  Switching to another psychiatrist, cries alot Pain Inventory Average Pain 5 Pain Right Now 7 My pain is intermittent, sharp, burning, dull, stabbing, tingling and aching  In the last 24 hours, has pain interfered with the following? General activity 8 Relation with others 8 Enjoyment of life 9 What TIME of day is your pain at its worst? morning, evening, night  Sleep (in general) Fair  Pain is worse with: walking, bending, sitting, inactivity, standing, unsure and some activites Pain improves with: rest, heat/ice, medication and TENS Relief from Meds: 7  Mobility walk without assistance how many minutes can you walk? 15-25 ability to climb steps?  yes do you drive?  yes transfers alone Do you have any goals in this area?  yes  Function disabled: date disabled . I need assistance with the following:  household duties and shopping  Neuro/Psych weakness numbness tremor tingling trouble walking spasms dizziness confusion depression anxiety  Prior Studies Any changes since last visit?  no  Physicians involved in your care Any changes since last visit?  no   Family History  Problem Relation Age of Onset  . Diabetes type II Mother   . Colon polyps Mother   . Diverticulitis Mother   . Diabetes Mother   . Heart disease Mother   . Diabetes type II Brother   . Uterine cancer    . Ovarian cancer     Social History   Social History  . Marital Status: Married    Spouse Name: N/A  . Number of Children: 2  . Years of Education: N/A   Occupational History  . Disability    Social History Main Topics  . Smoking status: Former Smoker    Types: Cigarettes    Quit date: 06/30/2011  .  Smokeless tobacco: Never Used  . Alcohol Use: No  . Drug Use: No  . Sexual Activity: Yes   Other Topics Concern  . None   Social History Narrative   Past Surgical History  Procedure Laterality Date  . Cesarean section    . Partial hysterectomy    . Tonsillectomy    . Left elbow surgery     Past Medical History  Diagnosis Date  . Fibromyalgia   . Anxiety   . Depression   . Degenerative disc disease     Spinal, small syrinx  . Chronic pain     Dr. KLetta Pate . GERD (gastroesophageal reflux disease)   . Esophageal stricture     Status post dilatation  . Syncope     Negative tilt table test  . Palpitations     No documented arrhythmias  . Hiatal hernia   . Internal hemorrhoids without mention of complication   . Personal history of colonic polyps 10/04/2000    HYPERPLASTIC POLYP  . Irritable bowel syndrome   . POTS (postural orthostatic tachycardia syndrome)    BP 114/78 mmHg  Pulse 88  Resp 14  SpO2 98%  Opioid Risk Score:   Fall Risk Score:  `1  Depression screen PHQ 2/9  [facility] 04/23/2015 03/26/2015 02/22/2015 10/22/2014  Decreased Interest 1 1 1 2   Down, Depressed, Hopeless 1 1 1 3   PHQ -  2 Score 2 2 2 5   Altered sleeping - - - 2  Tired, decreased energy - - - 3  Change in appetite - - - 0  Feeling bad or failure about yourself  - - - 2  Trouble concentrating - - - 1  Moving slowly or fidgety/restless - - - 2  Suicidal thoughts - - - 0  PHQ-9 Score - - - 15     Review of Systems  Constitutional: Positive for chills.  Respiratory: Positive for shortness of breath.   Musculoskeletal: Positive for myalgias, back pain, arthralgias, gait problem, neck pain and neck stiffness.  Neurological: Positive for dizziness, tremors, weakness and numbness.       Tingling   Psychiatric/Behavioral: Positive for confusion and dysphoric mood. The patient is nervous/anxious.   All other systems reviewed and are negative.      Objective:   Physical  Exam  Neurological: She has normal strength.  Reflex Scores:      Tricep reflexes are 2+ on the right side and 2+ on the left side.      Bicep reflexes are 2+ on the right side and 2+ on the left side.      Brachioradialis reflexes are 2+ on the right side and 2+ on the left side.      Patellar reflexes are 2+ on the right side and 2+ on the left side.      Achilles reflexes are 2+ on the right side and 2+ on the left side. 5/5 bilateral deltoids, biceps, triceps, grip, hip flexor, knee extensor, ankle disc dorsiflex or plantar flexor Sensation intact pinprick bilateral C5 C6 C7 C8 L2 L3 L4 L5-S1 dermatome distribution   Normal active range of motion bilateral upper and lower limbs No evidence of joint swelling in the hands wrists and knees or feet       Assessment & Plan:  1. Lumbar degenerative disc disease:  Refilled: HYDROcodone 10/325 mg two tablets every 8 hours as needed #180  We discussed a trial of Nucynta, she will check with her pharmacist to see what her out-of-pocket cost would be. 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  3. Fibromyalgia: Continue with Heat and exercise therapy. Have encouraged the patient to walk more 4.Depression and anxiety: Psychiatry Following , We'll be starting with a new psychiatrist soon 5. Orthostatic hypotension: .Cardiology Following, May get additional workup for this  6. Chronic Headaches: Continue Topamax 25 mg BID

## 2016-01-27 NOTE — Patient Instructions (Signed)
Nucynta 75 mg 4 times a day  Please check with your pharmacist whether you have prescription drug coverage for this medication  This would be in place of hydrocodone.

## 2016-02-24 ENCOUNTER — Encounter: Payer: Self-pay | Admitting: Registered Nurse

## 2016-02-24 ENCOUNTER — Encounter: Payer: Medicare Other | Attending: Physical Medicine & Rehabilitation | Admitting: Registered Nurse

## 2016-02-24 VITALS — BP 141/98 | HR 91

## 2016-02-24 DIAGNOSIS — M545 Low back pain: Secondary | ICD-10-CM | POA: Insufficient documentation

## 2016-02-24 DIAGNOSIS — K648 Other hemorrhoids: Secondary | ICD-10-CM | POA: Insufficient documentation

## 2016-02-24 DIAGNOSIS — K219 Gastro-esophageal reflux disease without esophagitis: Secondary | ICD-10-CM | POA: Diagnosis not present

## 2016-02-24 DIAGNOSIS — G8929 Other chronic pain: Secondary | ICD-10-CM | POA: Diagnosis not present

## 2016-02-24 DIAGNOSIS — M542 Cervicalgia: Secondary | ICD-10-CM | POA: Diagnosis not present

## 2016-02-24 DIAGNOSIS — Z599 Problem related to housing and economic circumstances, unspecified: Secondary | ICD-10-CM | POA: Diagnosis not present

## 2016-02-24 DIAGNOSIS — R51 Headache: Secondary | ICD-10-CM | POA: Diagnosis not present

## 2016-02-24 DIAGNOSIS — Z86010 Personal history of colonic polyps: Secondary | ICD-10-CM | POA: Diagnosis not present

## 2016-02-24 DIAGNOSIS — K449 Diaphragmatic hernia without obstruction or gangrene: Secondary | ICD-10-CM | POA: Diagnosis not present

## 2016-02-24 DIAGNOSIS — R002 Palpitations: Secondary | ICD-10-CM | POA: Diagnosis not present

## 2016-02-24 DIAGNOSIS — M791 Myalgia: Secondary | ICD-10-CM

## 2016-02-24 DIAGNOSIS — M609 Myositis, unspecified: Secondary | ICD-10-CM

## 2016-02-24 DIAGNOSIS — K589 Irritable bowel syndrome without diarrhea: Secondary | ICD-10-CM | POA: Insufficient documentation

## 2016-02-24 DIAGNOSIS — R55 Syncope and collapse: Secondary | ICD-10-CM | POA: Insufficient documentation

## 2016-02-24 DIAGNOSIS — K0889 Other specified disorders of teeth and supporting structures: Secondary | ICD-10-CM | POA: Insufficient documentation

## 2016-02-24 DIAGNOSIS — G894 Chronic pain syndrome: Secondary | ICD-10-CM | POA: Diagnosis not present

## 2016-02-24 DIAGNOSIS — Z87891 Personal history of nicotine dependence: Secondary | ICD-10-CM | POA: Diagnosis not present

## 2016-02-24 DIAGNOSIS — M47892 Other spondylosis, cervical region: Secondary | ICD-10-CM | POA: Insufficient documentation

## 2016-02-24 DIAGNOSIS — I951 Orthostatic hypotension: Secondary | ICD-10-CM | POA: Diagnosis not present

## 2016-02-24 DIAGNOSIS — M797 Fibromyalgia: Secondary | ICD-10-CM | POA: Insufficient documentation

## 2016-02-24 DIAGNOSIS — M5136 Other intervertebral disc degeneration, lumbar region: Secondary | ICD-10-CM | POA: Insufficient documentation

## 2016-02-24 DIAGNOSIS — Z79899 Other long term (current) drug therapy: Secondary | ICD-10-CM

## 2016-02-24 DIAGNOSIS — IMO0001 Reserved for inherently not codable concepts without codable children: Secondary | ICD-10-CM

## 2016-02-24 DIAGNOSIS — M47817 Spondylosis without myelopathy or radiculopathy, lumbosacral region: Secondary | ICD-10-CM | POA: Diagnosis not present

## 2016-02-24 DIAGNOSIS — F329 Major depressive disorder, single episode, unspecified: Secondary | ICD-10-CM | POA: Diagnosis not present

## 2016-02-24 DIAGNOSIS — K222 Esophageal obstruction: Secondary | ICD-10-CM | POA: Diagnosis not present

## 2016-02-24 DIAGNOSIS — R000 Tachycardia, unspecified: Secondary | ICD-10-CM | POA: Insufficient documentation

## 2016-02-24 DIAGNOSIS — F419 Anxiety disorder, unspecified: Secondary | ICD-10-CM | POA: Diagnosis not present

## 2016-02-24 DIAGNOSIS — Z760 Encounter for issue of repeat prescription: Secondary | ICD-10-CM | POA: Insufficient documentation

## 2016-02-24 DIAGNOSIS — Z5181 Encounter for therapeutic drug level monitoring: Secondary | ICD-10-CM

## 2016-02-24 MED ORDER — HYDROCODONE-ACETAMINOPHEN 10-325 MG PO TABS: 2.0000 | ORAL_TABLET | Freq: Three times a day (TID) | ORAL | 0 refills | Status: DC | PRN

## 2016-02-24 NOTE — Progress Notes (Signed)
Subjective:    Patient ID: [name], female    DOB: 1975/08/07, 40 y.o.   MRN: [id number]  HPI: [name] is a 40 year old female who returns for follow up for chronic pain and medication refill. She states her pain is located in her  mid- lower back. She rates her pain 7. Her current exercise regime is walking and performing stretching exercises. Also states in July she experienced some dizziness, she is awaiting an appointment with her neurologist.   Pain Inventory Average Pain 7 Pain Right Now 6 My pain is intermittent, sharp, burning, dull, stabbing, tingling and aching  In the last 24 hours, has pain interfered with the following? General activity 8 Relation with others 8 Enjoyment of life 8 What TIME of day is your pain at its worst? morning evening night Sleep (in general) Fair  Pain is worse with: walking, bending, sitting, inactivity and standing Pain improves with: rest, heat/ice, medication and TENS Relief from Meds: 7  Mobility walk without assistance how many minutes can you walk? 15-30 ability to climb steps?  yes do you drive?  yes needs help with transfers  Function disabled: date disabled . I need assistance with the following:  meal prep, household duties and shopping  Neuro/Psych weakness numbness tremor tingling spasms dizziness confusion depression anxiety loss of taste or smell  Prior Studies Any changes since last visit?  no  Physicians involved in your care Any changes since last visit?  no   Family History  Problem Relation Age of Onset  . Diabetes type II Mother   . Colon polyps Mother   . Diverticulitis Mother   . Diabetes Mother   . Heart disease Mother   . Diabetes type II Brother   . Uterine cancer    . Ovarian cancer     Social History   Social History  . Marital status: Married    Spouse name: N/A  . Number of children: 2  . Years of education: N/A   Occupational History  . Disability  Unemployed   Social History Main Topics  . Smoking status: Former Smoker    Types: Cigarettes    Quit date: 06/30/2011  . Smokeless tobacco: Never Used  . Alcohol use No  . Drug use: No  . Sexual activity: Yes   Other Topics Concern  . None   Social History Narrative  . None   Past Surgical History:  Procedure Laterality Date  . CESAREAN SECTION    . Left elbow surgery    . PARTIAL HYSTERECTOMY    . TONSILLECTOMY     Past Medical History:  Diagnosis Date  . Anxiety   . Chronic pain    Dr. Letta Pate  . Degenerative disc disease    Spinal, small syrinx  . Depression   . Esophageal stricture    Status post dilatation  . Fibromyalgia   . GERD (gastroesophageal reflux disease)   . Hiatal hernia   . Internal hemorrhoids without mention of complication   . Irritable bowel syndrome   . Palpitations    No documented arrhythmias  . Personal history of colonic polyps 10/04/2000   HYPERPLASTIC POLYP  . POTS (postural orthostatic tachycardia syndrome)   . Syncope    Negative tilt table test   BP (!) 141/98   Pulse 91   SpO2 100%   Opioid Risk Score:   Fall Risk Score:  `1  Depression screen PHQ 2/9  Depression screen [facility] 2/9  02/24/2016 04/23/2015 03/26/2015 02/22/2015 10/22/2014  Decreased Interest 1 1 1 1 2   Down, Depressed, Hopeless 1 1 1 1 3   PHQ - 2 Score 2 2 2 2 5   Altered sleeping - - - - 2  Tired, decreased energy - - - - 3  Change in appetite - - - - 0  Feeling bad or failure about yourself  - - - - 2  Trouble concentrating - - - - 1  Moving slowly or fidgety/restless - - - - 2  Suicidal thoughts - - - - 0  PHQ-9 Score - - - - 15     Review of Systems  All other systems reviewed and are negative.      Objective:   Physical Exam  Constitutional: She is oriented to person, place, and time. She appears well-developed and well-nourished.  HENT:  Head: Normocephalic and atraumatic.  Neck: Normal range of motion. Neck supple.  Cardiovascular: Normal  rate and regular rhythm.   Pulmonary/Chest: Effort normal and breath sounds normal.  Musculoskeletal:  Normal Muscle Bulk and Muscle Testing Reveals: Upper Extremities: Full ROM and Muscle Strength 5/5 Back without spinal tenderness noted Lower Extremities: Full ROM  And Muscle Strength 5/5 Arises from Table with ease Narrow Based gait  Neurological: She is alert and oriented to person, place, and time.  Skin: Skin is warm and dry.  Psychiatric: She has a normal mood and affect.  Nursing note and vitals reviewed.         Assessment & Plan:  1. Lumbar degenerative disc disease:  Refilled: HYDROcodone 10/325 mg two tablets every 8 hours as needed #180. We will continue the opioid monitoring program, this consists of regular clinic visits, examinations, urine drug screen, pill counts as well as use of [place] Controlled Substance Reporting System.  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 3. Fibromyalgia: Continue with Heat and exercise therapy.  4.Depression and anxiety: Psychiatry Following  5. Orthostatic hypotension: .Cardiology Following  6. Chronic Headaches: Continue Topamax 25 mg BID.Marland Kitchen  20 minutes of face to face patient care time was spent during this visit. All questions were encouraged and answered.

## 2016-03-10 DIAGNOSIS — Z0000 Encounter for general adult medical examination without abnormal findings: Secondary | ICD-10-CM | POA: Diagnosis not present

## 2016-03-10 DIAGNOSIS — M797 Fibromyalgia: Secondary | ICD-10-CM | POA: Diagnosis not present

## 2016-03-10 DIAGNOSIS — K219 Gastro-esophageal reflux disease without esophagitis: Secondary | ICD-10-CM | POA: Diagnosis not present

## 2016-03-10 DIAGNOSIS — Z1389 Encounter for screening for other disorder: Secondary | ICD-10-CM | POA: Diagnosis not present

## 2016-03-23 ENCOUNTER — Ambulatory Visit: Payer: Medicare Other | Admitting: Physical Medicine & Rehabilitation

## 2016-03-23 ENCOUNTER — Encounter: Payer: Medicare Other | Attending: Physical Medicine & Rehabilitation

## 2016-03-23 DIAGNOSIS — K449 Diaphragmatic hernia without obstruction or gangrene: Secondary | ICD-10-CM | POA: Insufficient documentation

## 2016-03-23 DIAGNOSIS — R55 Syncope and collapse: Secondary | ICD-10-CM | POA: Insufficient documentation

## 2016-03-23 DIAGNOSIS — G8929 Other chronic pain: Secondary | ICD-10-CM | POA: Insufficient documentation

## 2016-03-23 DIAGNOSIS — K648 Other hemorrhoids: Secondary | ICD-10-CM | POA: Insufficient documentation

## 2016-03-23 DIAGNOSIS — F329 Major depressive disorder, single episode, unspecified: Secondary | ICD-10-CM | POA: Insufficient documentation

## 2016-03-23 DIAGNOSIS — Z599 Problem related to housing and economic circumstances, unspecified: Secondary | ICD-10-CM | POA: Insufficient documentation

## 2016-03-23 DIAGNOSIS — F419 Anxiety disorder, unspecified: Secondary | ICD-10-CM | POA: Insufficient documentation

## 2016-03-23 DIAGNOSIS — I951 Orthostatic hypotension: Secondary | ICD-10-CM | POA: Insufficient documentation

## 2016-03-23 DIAGNOSIS — K0889 Other specified disorders of teeth and supporting structures: Secondary | ICD-10-CM | POA: Insufficient documentation

## 2016-03-23 DIAGNOSIS — K222 Esophageal obstruction: Secondary | ICD-10-CM | POA: Insufficient documentation

## 2016-03-23 DIAGNOSIS — Z86010 Personal history of colonic polyps: Secondary | ICD-10-CM | POA: Insufficient documentation

## 2016-03-23 DIAGNOSIS — R002 Palpitations: Secondary | ICD-10-CM | POA: Insufficient documentation

## 2016-03-23 DIAGNOSIS — M47892 Other spondylosis, cervical region: Secondary | ICD-10-CM | POA: Insufficient documentation

## 2016-03-23 DIAGNOSIS — M5136 Other intervertebral disc degeneration, lumbar region: Secondary | ICD-10-CM | POA: Insufficient documentation

## 2016-03-23 DIAGNOSIS — R51 Headache: Secondary | ICD-10-CM | POA: Insufficient documentation

## 2016-03-23 DIAGNOSIS — M542 Cervicalgia: Secondary | ICD-10-CM | POA: Insufficient documentation

## 2016-03-23 DIAGNOSIS — Z87891 Personal history of nicotine dependence: Secondary | ICD-10-CM | POA: Insufficient documentation

## 2016-03-23 DIAGNOSIS — Z760 Encounter for issue of repeat prescription: Secondary | ICD-10-CM | POA: Insufficient documentation

## 2016-03-23 DIAGNOSIS — M545 Low back pain: Secondary | ICD-10-CM | POA: Insufficient documentation

## 2016-03-23 DIAGNOSIS — K219 Gastro-esophageal reflux disease without esophagitis: Secondary | ICD-10-CM | POA: Insufficient documentation

## 2016-03-23 DIAGNOSIS — K589 Irritable bowel syndrome without diarrhea: Secondary | ICD-10-CM | POA: Insufficient documentation

## 2016-03-23 DIAGNOSIS — R000 Tachycardia, unspecified: Secondary | ICD-10-CM | POA: Insufficient documentation

## 2016-03-23 DIAGNOSIS — M797 Fibromyalgia: Secondary | ICD-10-CM | POA: Insufficient documentation

## 2016-03-26 ENCOUNTER — Ambulatory Visit (HOSPITAL_BASED_OUTPATIENT_CLINIC_OR_DEPARTMENT_OTHER): Payer: Medicare Other | Admitting: Physical Medicine & Rehabilitation

## 2016-03-26 ENCOUNTER — Encounter: Payer: Self-pay | Admitting: Physical Medicine & Rehabilitation

## 2016-03-26 VITALS — BP 114/76 | HR 102 | Resp 17

## 2016-03-26 DIAGNOSIS — K589 Irritable bowel syndrome without diarrhea: Secondary | ICD-10-CM | POA: Diagnosis not present

## 2016-03-26 DIAGNOSIS — M47892 Other spondylosis, cervical region: Secondary | ICD-10-CM | POA: Diagnosis not present

## 2016-03-26 DIAGNOSIS — R51 Headache: Secondary | ICD-10-CM | POA: Diagnosis not present

## 2016-03-26 DIAGNOSIS — I951 Orthostatic hypotension: Secondary | ICD-10-CM | POA: Diagnosis not present

## 2016-03-26 DIAGNOSIS — M47812 Spondylosis without myelopathy or radiculopathy, cervical region: Secondary | ICD-10-CM

## 2016-03-26 DIAGNOSIS — F419 Anxiety disorder, unspecified: Secondary | ICD-10-CM | POA: Diagnosis not present

## 2016-03-26 DIAGNOSIS — Z599 Problem related to housing and economic circumstances, unspecified: Secondary | ICD-10-CM | POA: Diagnosis not present

## 2016-03-26 DIAGNOSIS — Z87891 Personal history of nicotine dependence: Secondary | ICD-10-CM | POA: Diagnosis not present

## 2016-03-26 DIAGNOSIS — M542 Cervicalgia: Secondary | ICD-10-CM | POA: Diagnosis not present

## 2016-03-26 DIAGNOSIS — R002 Palpitations: Secondary | ICD-10-CM | POA: Diagnosis not present

## 2016-03-26 DIAGNOSIS — R000 Tachycardia, unspecified: Secondary | ICD-10-CM | POA: Diagnosis not present

## 2016-03-26 DIAGNOSIS — M797 Fibromyalgia: Secondary | ICD-10-CM

## 2016-03-26 DIAGNOSIS — G8929 Other chronic pain: Secondary | ICD-10-CM | POA: Diagnosis not present

## 2016-03-26 DIAGNOSIS — R55 Syncope and collapse: Secondary | ICD-10-CM | POA: Diagnosis not present

## 2016-03-26 DIAGNOSIS — K222 Esophageal obstruction: Secondary | ICD-10-CM | POA: Diagnosis not present

## 2016-03-26 DIAGNOSIS — Z86010 Personal history of colonic polyps: Secondary | ICD-10-CM | POA: Diagnosis not present

## 2016-03-26 DIAGNOSIS — M47817 Spondylosis without myelopathy or radiculopathy, lumbosacral region: Secondary | ICD-10-CM | POA: Diagnosis not present

## 2016-03-26 DIAGNOSIS — G894 Chronic pain syndrome: Secondary | ICD-10-CM

## 2016-03-26 DIAGNOSIS — K219 Gastro-esophageal reflux disease without esophagitis: Secondary | ICD-10-CM | POA: Diagnosis not present

## 2016-03-26 DIAGNOSIS — Z760 Encounter for issue of repeat prescription: Secondary | ICD-10-CM | POA: Diagnosis not present

## 2016-03-26 DIAGNOSIS — K0889 Other specified disorders of teeth and supporting structures: Secondary | ICD-10-CM | POA: Diagnosis not present

## 2016-03-26 DIAGNOSIS — K449 Diaphragmatic hernia without obstruction or gangrene: Secondary | ICD-10-CM | POA: Diagnosis not present

## 2016-03-26 DIAGNOSIS — M545 Low back pain: Secondary | ICD-10-CM | POA: Diagnosis not present

## 2016-03-26 DIAGNOSIS — M5136 Other intervertebral disc degeneration, lumbar region: Secondary | ICD-10-CM | POA: Diagnosis not present

## 2016-03-26 DIAGNOSIS — K648 Other hemorrhoids: Secondary | ICD-10-CM | POA: Diagnosis not present

## 2016-03-26 DIAGNOSIS — F329 Major depressive disorder, single episode, unspecified: Secondary | ICD-10-CM | POA: Diagnosis not present

## 2016-03-26 MED ORDER — HYDROCODONE-ACETAMINOPHEN 10-325 MG PO TABS
2.0000 | ORAL_TABLET | Freq: Three times a day (TID) | ORAL | 0 refills | Status: DC | PRN
Start: 2016-03-26 — End: 2016-04-22

## 2016-03-26 NOTE — Progress Notes (Signed)
Subjective:    Patient ID: [name], female    DOB: 1976/04/23, 40 y.o.   MRN: [id number]  HPI Worried about her brother who is febrile in ER with diabetic ulcer in foot. Patient also had episode in July were for couple days she felt funny. She felt like she was shaking at times and moved her arms and legs like a baby. She did not go to the emergency department during this time. She saw her primary care physician. Several days later. She states that they discussed whether she had effects from the syrinx or Chiari malformation. I reviewed the patient's previous visits with neurology including MRI from 2014, which did not show any abnormalities, including Chiari malformations. We reviewed MRI of the cervical spine from 2011, which showed a tiny syrinx at C6-C7. She does not recall whether or not. She's had follow-up MRIs since that time. She had been seen by neurology and neurosurgery, and may have had an MRI outside of the [facility] system. She also had an MRI of the thoracic spine in 2009. Subsequent neurosurgery evaluation. 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  Patient's primary care physician referred her to [facility]. She already sees cardiology. Therefore, her postural orthostatic tachycardia syndrome. Pain Inventory Average Pain 6 Pain Right Now 5 My pain is intermittent, sharp, burning, stabbing, tingling and aching  In the last 24 hours, has pain interfered with the following? General activity 7 Relation with others 8 Enjoyment of life 8 What TIME of day is your pain at its worst? morning, evening, night Sleep (in general) Fair  Pain is worse with: walking, bending, sitting, inactivity, standing, unsure and some activites Pain improves with: rest, heat/ice, medication and TENS Relief from Meds: fair  Mobility walk without assistance how many minutes can you walk? 15 min ability to climb steps?  yes do you drive?  yes needs help  with transfers transfers alone Do you have any goals in this area?  yes  Function disabled: date disabled NA I need assistance with the following:  meal prep, household duties and shopping Do you have any goals in this area?  yes  Neuro/Psych weakness numbness tremor tingling spasms dizziness confusion depression anxiety  Prior Studies Any changes since last visit?  no  Physicians involved in your care Any changes since last visit?  yes   Family History  Problem Relation Age of Onset  . Diabetes type II Mother   . Colon polyps Mother   . Diverticulitis Mother   . Diabetes Mother   . Heart disease Mother   . Diabetes type II Brother   . Uterine cancer    . Ovarian cancer     Social History   Social History  . Marital status: Married    Spouse name: N/A  . Number of children: 2  . Years of education: N/A   Occupational History  . Disability Unemployed   Social History Main Topics  . Smoking status: Former Smoker    Types: Cigarettes    Quit date: 06/30/2011  . Smokeless tobacco: Never Used  . Alcohol use No  . Drug use: No  . Sexual activity: Yes   Other Topics Concern  . None   Social History Narrative  . None   Past Surgical History:  Procedure Laterality Date  . CESAREAN SECTION    . Left elbow surgery    . PARTIAL HYSTERECTOMY    . TONSILLECTOMY  Past Medical History:  Diagnosis Date  . Anxiety   . Chronic pain    Dr. Letta Pate  . Degenerative disc disease    Spinal, small syrinx  . Depression   . Esophageal stricture    Status post dilatation  . Fibromyalgia   . GERD (gastroesophageal reflux disease)   . Hiatal hernia   . Internal hemorrhoids without mention of complication   . Irritable bowel syndrome   . Palpitations    No documented arrhythmias  . Personal history of colonic polyps 10/04/2000   HYPERPLASTIC POLYP  . POTS (postural orthostatic tachycardia syndrome)   . Syncope    Negative tilt table test   BP 114/76    Pulse (!) 102   Resp 17   SpO2 98%   Opioid Risk Score:   Fall Risk Score:  `1  Depression screen PHQ 2/9  Depression screen [facility] 2/9 02/24/2016 04/23/2015 03/26/2015 02/22/2015 10/22/2014  Decreased Interest 1 1 1 1 2   Down, Depressed, Hopeless 1 1 1 1 3   PHQ - 2 Score 2 2 2 2 5   Altered sleeping - - - - 2  Tired, decreased energy - - - - 3  Change in appetite - - - - 0  Feeling bad or failure about yourself  - - - - 2  Trouble concentrating - - - - 1  Moving slowly or fidgety/restless - - - - 2  Suicidal thoughts - - - - 0  PHQ-9 Score - - - - 15    Review of Systems  Neurological: Positive for dizziness, tremors, weakness and numbness.       Tingling  Spasms   Psychiatric/Behavioral: Positive for confusion and dysphoric mood. The patient is nervous/anxious.   All other systems reviewed and are negative.      Objective:   Physical Exam  Constitutional: She appears well-developed and well-nourished.  HENT:  Head: Normocephalic and atraumatic.  Eyes: Conjunctivae are normal. Pupils are equal, round, and reactive to light.  Neck: Normal range of motion.  Musculoskeletal:       Cervical back: She exhibits normal range of motion and no tenderness.       Thoracic back: She exhibits normal range of motion and no tenderness.       Lumbar back: She exhibits decreased range of motion. She exhibits no tenderness.  Reduced lumbar flexion, extension, lateral rotation and bending approximate 75% of normal.  Neurological: She is alert. No sensory deficit. She exhibits normal muscle tone. Coordination normal.  Reflex Scores:      Tricep reflexes are 2+ on the right side and 2+ on the left side.      Bicep reflexes are 2+ on the right side and 2+ on the left side.      Brachioradialis reflexes are 2+ on the right side and 2+ on the left side.      Patellar reflexes are 2+ on the right side and 2+ on the left side.      Achilles reflexes are 2+ on the right side and 2+ on the left  side. Motor strength is 5/5 bilateral deltoid, biceps, triceps, grip, hip flexor, knee extensor, ankle dorsiflexion, plantar flexor. Deep tendon reflexes are difficult to assess secondary to inability to relax, but with facilitation. They are normal   Psychiatric: Her speech is normal and behavior is normal. Judgment and thought content normal. Her mood appears anxious. Her affect is labile. She exhibits abnormal remote memory.  Poor medical historian, does not recall  being seen by neurosurgery or neurology here in [facility] note and vitals reviewed.         Assessment & Plan:  1. 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  3. Fibromyalgia: Continue with Heat and exercise therapy.  4.Depression and anxiety: Psychiatry, patient's anxiety may be causing some of her physical symptoms.          5. Orthostatic hypotension: .Cardiology Following, May get additional workup for this  6. Chronic Headaches: Continue Topamax 25 mg BID 7.  Patient had some type of spell. Although she did not seek medical attention at the time she was experiencing these. Seizure disorder is possible, although she does not have a prior history. We discussed that she does not have a Chiari malformation. We also discussed that the thoracic and cervical syrinx that she has are very tiny and not symptomatic and have been previously identified by neurosurgery. There is a potential that these could've gotten larger over time. Her neuro exam is stable at this time She plans to follow up with neurology

## 2016-03-26 NOTE — Patient Instructions (Signed)
Please check with your pharmacist about your insurance coverage for Nucynta Either 75 mg of immediate release 4 times a day or 150 mg of extended release twice a day

## 2016-04-22 ENCOUNTER — Encounter: Payer: Self-pay | Admitting: *Deleted

## 2016-04-23 ENCOUNTER — Encounter: Payer: Self-pay | Admitting: Registered Nurse

## 2016-04-23 ENCOUNTER — Encounter: Payer: Medicare Other | Attending: Physical Medicine & Rehabilitation | Admitting: Registered Nurse

## 2016-04-23 VITALS — BP 107/70 | HR 101

## 2016-04-23 DIAGNOSIS — G894 Chronic pain syndrome: Secondary | ICD-10-CM | POA: Diagnosis not present

## 2016-04-23 DIAGNOSIS — M47817 Spondylosis without myelopathy or radiculopathy, lumbosacral region: Secondary | ICD-10-CM | POA: Diagnosis not present

## 2016-04-23 DIAGNOSIS — K589 Irritable bowel syndrome without diarrhea: Secondary | ICD-10-CM | POA: Insufficient documentation

## 2016-04-23 DIAGNOSIS — Z760 Encounter for issue of repeat prescription: Secondary | ICD-10-CM | POA: Diagnosis not present

## 2016-04-23 DIAGNOSIS — Z599 Problem related to housing and economic circumstances, unspecified: Secondary | ICD-10-CM | POA: Insufficient documentation

## 2016-04-23 DIAGNOSIS — K449 Diaphragmatic hernia without obstruction or gangrene: Secondary | ICD-10-CM | POA: Insufficient documentation

## 2016-04-23 DIAGNOSIS — M609 Myositis, unspecified: Secondary | ICD-10-CM

## 2016-04-23 DIAGNOSIS — R55 Syncope and collapse: Secondary | ICD-10-CM | POA: Diagnosis not present

## 2016-04-23 DIAGNOSIS — M542 Cervicalgia: Secondary | ICD-10-CM | POA: Diagnosis not present

## 2016-04-23 DIAGNOSIS — M25532 Pain in left wrist: Secondary | ICD-10-CM | POA: Diagnosis not present

## 2016-04-23 DIAGNOSIS — K219 Gastro-esophageal reflux disease without esophagitis: Secondary | ICD-10-CM | POA: Insufficient documentation

## 2016-04-23 DIAGNOSIS — R002 Palpitations: Secondary | ICD-10-CM | POA: Insufficient documentation

## 2016-04-23 DIAGNOSIS — IMO0001 Reserved for inherently not codable concepts without codable children: Secondary | ICD-10-CM

## 2016-04-23 DIAGNOSIS — K0889 Other specified disorders of teeth and supporting structures: Secondary | ICD-10-CM | POA: Insufficient documentation

## 2016-04-23 DIAGNOSIS — Z5181 Encounter for therapeutic drug level monitoring: Secondary | ICD-10-CM

## 2016-04-23 DIAGNOSIS — K648 Other hemorrhoids: Secondary | ICD-10-CM | POA: Insufficient documentation

## 2016-04-23 DIAGNOSIS — K222 Esophageal obstruction: Secondary | ICD-10-CM | POA: Insufficient documentation

## 2016-04-23 DIAGNOSIS — F329 Major depressive disorder, single episode, unspecified: Secondary | ICD-10-CM | POA: Diagnosis not present

## 2016-04-23 DIAGNOSIS — M47892 Other spondylosis, cervical region: Secondary | ICD-10-CM | POA: Diagnosis not present

## 2016-04-23 DIAGNOSIS — Z79899 Other long term (current) drug therapy: Secondary | ICD-10-CM

## 2016-04-23 DIAGNOSIS — M797 Fibromyalgia: Secondary | ICD-10-CM | POA: Diagnosis not present

## 2016-04-23 DIAGNOSIS — I951 Orthostatic hypotension: Secondary | ICD-10-CM | POA: Insufficient documentation

## 2016-04-23 DIAGNOSIS — R000 Tachycardia, unspecified: Secondary | ICD-10-CM | POA: Insufficient documentation

## 2016-04-23 DIAGNOSIS — F419 Anxiety disorder, unspecified: Secondary | ICD-10-CM | POA: Insufficient documentation

## 2016-04-23 DIAGNOSIS — M791 Myalgia: Secondary | ICD-10-CM

## 2016-04-23 DIAGNOSIS — M5136 Other intervertebral disc degeneration, lumbar region: Secondary | ICD-10-CM | POA: Insufficient documentation

## 2016-04-23 DIAGNOSIS — R51 Headache: Secondary | ICD-10-CM | POA: Insufficient documentation

## 2016-04-23 DIAGNOSIS — M545 Low back pain: Secondary | ICD-10-CM | POA: Insufficient documentation

## 2016-04-23 DIAGNOSIS — G8929 Other chronic pain: Secondary | ICD-10-CM | POA: Insufficient documentation

## 2016-04-23 DIAGNOSIS — Z87891 Personal history of nicotine dependence: Secondary | ICD-10-CM | POA: Diagnosis not present

## 2016-04-23 DIAGNOSIS — Z86010 Personal history of colonic polyps: Secondary | ICD-10-CM | POA: Insufficient documentation

## 2016-04-23 MED ORDER — HYDROCODONE-ACETAMINOPHEN 10-325 MG PO TABS: ORAL_TABLET | ORAL | 0 refills | Status: DC

## 2016-04-23 NOTE — Progress Notes (Signed)
Subjective:    Patient ID: [name], female    DOB: Feb 25, 1976, 40 y.o.   MRN: [id number]  HPI: [name] is a 40 year old female who returns for follow up for chronic pain and medication refill. She states her pain is located in her left wrist andmid- lower back.  Denies falling. She rates her pain 7. Her current exercise regime is walking and performing stretching exercises. [name] hasn't check on the cost of Nucynta she states she will this month.  Pain Inventory Average Pain 7-8 Pain Right Now 6-7 My pain is intermittent, sharp, burning, dull, stabbing, tingling and aching  In the last 24 hours, has pain interfered with the following? General activity 7 Relation with others 8 Enjoyment of life 8 What TIME of day is your pain at its worst? morning, evening, night Sleep (in general) NA  Pain is worse with: walking, bending, sitting, inactivity, standing, unsure and some activites Pain improves with: rest, heat/ice, medication and TENS Relief from Meds: 7  Mobility walk without assistance how many minutes can you walk? 15-25 mins  Function Do you have any goals in this area?  no  Neuro/Psych No problems in this area  Prior Studies Any changes since last visit?  no  Physicians involved in your care Any changes since last visit?  no   Family History  Problem Relation Age of Onset  . Diabetes type II Mother   . Colon polyps Mother   . Diverticulitis Mother   . Diabetes Mother   . Heart disease Mother   . Diabetes type II Brother   . Uterine cancer    . Ovarian cancer     Social History   Social History  . Marital status: Married    Spouse name: N/A  . Number of children: 2  . Years of education: N/A   Occupational History  . Disability Unemployed   Social History Main Topics  . Smoking status: Former Smoker    Types: Cigarettes    Quit date: 06/30/2011  . Smokeless tobacco: Never Used  . Alcohol use No  . Drug use: No  .  Sexual activity: Yes   Other Topics Concern  . Not on file   Social History Narrative  . No narrative on file   Past Surgical History:  Procedure Laterality Date  . CESAREAN SECTION    . Left elbow surgery    . PARTIAL HYSTERECTOMY    . TONSILLECTOMY     Past Medical History:  Diagnosis Date  . Anxiety   . Chronic pain    Dr. Letta Pate  . Degenerative disc disease    Spinal, small syrinx  . Depression   . Esophageal stricture    Status post dilatation  . Fibromyalgia   . GERD (gastroesophageal reflux disease)   . Hiatal hernia   . Internal hemorrhoids without mention of complication   . Irritable bowel syndrome   . Palpitations    No documented arrhythmias  . Personal history of colonic polyps 10/04/2000   HYPERPLASTIC POLYP  . POTS (postural orthostatic tachycardia syndrome)   . Syncope    Negative tilt table test  . 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  Opioid Risk Score:   Fall Risk Score:  `1  Depression screen PHQ 2/9  Depression screen [facility] 2/9 02/24/2016 04/23/2015 03/26/2015 02/22/2015 10/22/2014  Decreased Interest 1 1 1 1 2   Down, Depressed, Hopeless  1 1 1 1 3   PHQ - 2 Score 2 2 2 2 5   Altered sleeping - - - - 2  Tired, decreased energy - - - - 3  Change in appetite - - - - 0  Feeling bad or failure about yourself  - - - - 2  Trouble concentrating - - - - 1  Moving slowly or fidgety/restless - - - - 2  Suicidal thoughts - - - - 0  PHQ-9 Score - - - - 15    Review of Systems  All other systems reviewed and are negative.      Objective:   Physical Exam  Constitutional: She is oriented to person, place, and time. She appears well-developed and well-nourished.  HENT:  Head: Normocephalic and atraumatic.  Eyes: EOM are normal. Pupils are equal, round, and reactive to light.  Cardiovascular: Normal rate and regular rhythm.   Pulmonary/Chest: Effort normal and breath sounds normal.  Musculoskeletal:    Normal Muscle Bulk and Muscle Testing Reveals: Upper Extremities: Full ROM and Muscle Strength 5/5 Lumbar Paraspinal Tenderness: L-3- L-5 Lower Extremities: Full ROM and Muscle Strength 5/5 Arises from chair with ease Narrow Based Gait   Neurological: She is alert and oriented to person, place, and time.  Skin: Skin is warm and dry.  Psychiatric: She has a normal mood and affect.  Nursing note and vitals reviewed.         Assessment & Plan:  1. Lumbar degenerative disc disease:  Refilled: HYDROcodone 10/325 mg two tablets every 8 hours as needed #180. We will continue the opioid monitoring program, this consists of regular clinic visits, examinations, urine drug screen, pill counts as well as use of [place] Controlled Substance Reporting System.  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 3. Fibromyalgia: Continue with Heat and exercise therapy.  4.Depression and anxiety: Psychiatry Following  5. Orthostatic hypotension: .Cardiology Following  6. Chronic Headaches: Continue Topamax 25 mg BID.Marland Kitchen  20 minutes of face to face patient care time was spent during this visit. All questions were encouraged and answered.

## 2016-04-23 NOTE — Patient Instructions (Signed)
Nucynta 150 mg ER BID  Tier 3   Find out your co-pay

## 2016-04-28 ENCOUNTER — Other Ambulatory Visit: Payer: Self-pay | Admitting: Adult Health

## 2016-05-22 ENCOUNTER — Encounter: Payer: Medicare Other | Attending: Physical Medicine & Rehabilitation | Admitting: Registered Nurse

## 2016-05-22 DIAGNOSIS — M47892 Other spondylosis, cervical region: Secondary | ICD-10-CM | POA: Insufficient documentation

## 2016-05-22 DIAGNOSIS — M545 Low back pain: Secondary | ICD-10-CM | POA: Insufficient documentation

## 2016-05-22 DIAGNOSIS — R002 Palpitations: Secondary | ICD-10-CM | POA: Insufficient documentation

## 2016-05-22 DIAGNOSIS — Z599 Problem related to housing and economic circumstances, unspecified: Secondary | ICD-10-CM | POA: Insufficient documentation

## 2016-05-22 DIAGNOSIS — M542 Cervicalgia: Secondary | ICD-10-CM | POA: Insufficient documentation

## 2016-05-22 DIAGNOSIS — K219 Gastro-esophageal reflux disease without esophagitis: Secondary | ICD-10-CM | POA: Insufficient documentation

## 2016-05-22 DIAGNOSIS — Z86010 Personal history of colonic polyps: Secondary | ICD-10-CM | POA: Insufficient documentation

## 2016-05-22 DIAGNOSIS — M5136 Other intervertebral disc degeneration, lumbar region: Secondary | ICD-10-CM | POA: Insufficient documentation

## 2016-05-22 DIAGNOSIS — K222 Esophageal obstruction: Secondary | ICD-10-CM | POA: Insufficient documentation

## 2016-05-22 DIAGNOSIS — M797 Fibromyalgia: Secondary | ICD-10-CM | POA: Insufficient documentation

## 2016-05-22 DIAGNOSIS — F419 Anxiety disorder, unspecified: Secondary | ICD-10-CM | POA: Insufficient documentation

## 2016-05-22 DIAGNOSIS — F329 Major depressive disorder, single episode, unspecified: Secondary | ICD-10-CM | POA: Insufficient documentation

## 2016-05-22 DIAGNOSIS — K449 Diaphragmatic hernia without obstruction or gangrene: Secondary | ICD-10-CM | POA: Insufficient documentation

## 2016-05-22 DIAGNOSIS — Z87891 Personal history of nicotine dependence: Secondary | ICD-10-CM | POA: Insufficient documentation

## 2016-05-22 DIAGNOSIS — I951 Orthostatic hypotension: Secondary | ICD-10-CM | POA: Insufficient documentation

## 2016-05-22 DIAGNOSIS — K0889 Other specified disorders of teeth and supporting structures: Secondary | ICD-10-CM | POA: Insufficient documentation

## 2016-05-22 DIAGNOSIS — R55 Syncope and collapse: Secondary | ICD-10-CM | POA: Insufficient documentation

## 2016-05-22 DIAGNOSIS — K648 Other hemorrhoids: Secondary | ICD-10-CM | POA: Insufficient documentation

## 2016-05-22 DIAGNOSIS — R000 Tachycardia, unspecified: Secondary | ICD-10-CM | POA: Insufficient documentation

## 2016-05-22 DIAGNOSIS — R51 Headache: Secondary | ICD-10-CM | POA: Insufficient documentation

## 2016-05-22 DIAGNOSIS — G8929 Other chronic pain: Secondary | ICD-10-CM | POA: Insufficient documentation

## 2016-05-22 DIAGNOSIS — Z760 Encounter for issue of repeat prescription: Secondary | ICD-10-CM | POA: Insufficient documentation

## 2016-05-22 DIAGNOSIS — K589 Irritable bowel syndrome without diarrhea: Secondary | ICD-10-CM | POA: Insufficient documentation

## 2016-05-25 DIAGNOSIS — R5382 Chronic fatigue, unspecified: Secondary | ICD-10-CM | POA: Diagnosis not present

## 2016-05-26 ENCOUNTER — Ambulatory Visit (HOSPITAL_BASED_OUTPATIENT_CLINIC_OR_DEPARTMENT_OTHER): Payer: Medicare Other | Admitting: Physical Medicine & Rehabilitation

## 2016-05-26 ENCOUNTER — Encounter: Payer: Self-pay | Admitting: Physical Medicine & Rehabilitation

## 2016-05-26 VITALS — BP 102/67 | HR 90 | Resp 14

## 2016-05-26 DIAGNOSIS — K222 Esophageal obstruction: Secondary | ICD-10-CM | POA: Diagnosis not present

## 2016-05-26 DIAGNOSIS — I951 Orthostatic hypotension: Secondary | ICD-10-CM

## 2016-05-26 DIAGNOSIS — F329 Major depressive disorder, single episode, unspecified: Secondary | ICD-10-CM | POA: Diagnosis not present

## 2016-05-26 DIAGNOSIS — G894 Chronic pain syndrome: Secondary | ICD-10-CM

## 2016-05-26 DIAGNOSIS — Z760 Encounter for issue of repeat prescription: Secondary | ICD-10-CM | POA: Diagnosis not present

## 2016-05-26 DIAGNOSIS — K589 Irritable bowel syndrome without diarrhea: Secondary | ICD-10-CM | POA: Diagnosis not present

## 2016-05-26 DIAGNOSIS — R55 Syncope and collapse: Secondary | ICD-10-CM | POA: Diagnosis not present

## 2016-05-26 DIAGNOSIS — M47812 Spondylosis without myelopathy or radiculopathy, cervical region: Secondary | ICD-10-CM

## 2016-05-26 DIAGNOSIS — K0889 Other specified disorders of teeth and supporting structures: Secondary | ICD-10-CM | POA: Diagnosis not present

## 2016-05-26 DIAGNOSIS — R002 Palpitations: Secondary | ICD-10-CM | POA: Diagnosis not present

## 2016-05-26 DIAGNOSIS — Z599 Problem related to housing and economic circumstances, unspecified: Secondary | ICD-10-CM | POA: Diagnosis not present

## 2016-05-26 DIAGNOSIS — G90A Postural orthostatic tachycardia syndrome (POTS): Secondary | ICD-10-CM

## 2016-05-26 DIAGNOSIS — M797 Fibromyalgia: Secondary | ICD-10-CM

## 2016-05-26 DIAGNOSIS — Z86010 Personal history of colonic polyps: Secondary | ICD-10-CM | POA: Diagnosis not present

## 2016-05-26 DIAGNOSIS — F419 Anxiety disorder, unspecified: Secondary | ICD-10-CM | POA: Diagnosis not present

## 2016-05-26 DIAGNOSIS — G8929 Other chronic pain: Secondary | ICD-10-CM | POA: Diagnosis not present

## 2016-05-26 DIAGNOSIS — M47817 Spondylosis without myelopathy or radiculopathy, lumbosacral region: Secondary | ICD-10-CM | POA: Diagnosis not present

## 2016-05-26 DIAGNOSIS — M5136 Other intervertebral disc degeneration, lumbar region: Secondary | ICD-10-CM | POA: Diagnosis not present

## 2016-05-26 DIAGNOSIS — R000 Tachycardia, unspecified: Secondary | ICD-10-CM

## 2016-05-26 DIAGNOSIS — Z87891 Personal history of nicotine dependence: Secondary | ICD-10-CM | POA: Diagnosis not present

## 2016-05-26 DIAGNOSIS — M47892 Other spondylosis, cervical region: Secondary | ICD-10-CM | POA: Diagnosis not present

## 2016-05-26 DIAGNOSIS — K449 Diaphragmatic hernia without obstruction or gangrene: Secondary | ICD-10-CM | POA: Diagnosis not present

## 2016-05-26 DIAGNOSIS — M542 Cervicalgia: Secondary | ICD-10-CM | POA: Diagnosis not present

## 2016-05-26 DIAGNOSIS — K648 Other hemorrhoids: Secondary | ICD-10-CM | POA: Diagnosis not present

## 2016-05-26 DIAGNOSIS — M545 Low back pain: Secondary | ICD-10-CM | POA: Diagnosis not present

## 2016-05-26 DIAGNOSIS — K219 Gastro-esophageal reflux disease without esophagitis: Secondary | ICD-10-CM | POA: Diagnosis not present

## 2016-05-26 DIAGNOSIS — R51 Headache: Secondary | ICD-10-CM | POA: Diagnosis not present

## 2016-05-26 MED ORDER — TAPENTADOL HCL ER 100 MG PO TB12: 100.0000 mg | ORAL_TABLET | Freq: Two times a day (BID) | ORAL | 0 refills | Status: DC

## 2016-05-26 NOTE — Patient Instructions (Addendum)
Reduce hydrocodone to 1 tablet 3 times a day for 5 days then stop Nucynta ER twice a day

## 2016-05-26 NOTE — Progress Notes (Signed)
Subjective:    Patient ID: [name], female    DOB: 27-May-1976, 40 y.o.   MRN: [id number]  HPI patient states that she gets anxious about the diagnosis of pots. She states she would rather have cancer that can be fixed. Her pain levels are not increased. However, they have been stable in the 7-8 range, and the patient would like to try a different medication to see if it would help more than her current medication. She thinks her insurance will pay for Nucynta. She has checked into this. She has tried hydrocodone already  Pain Inventory Average Pain 8 Pain Right Now 7 My pain is dull and stabbing  In the last 24 hours, has pain interfered with the following? General activity 8 Relation with others 7 Enjoyment of life 8 What TIME of day is your pain at its worst? varies Sleep (in general) Fair  Pain is worse with: walking, bending, sitting, inactivity, standing and some activites Pain improves with: rest, therapy/exercise, medication and TENS Relief from Meds: 7  Mobility walk without assistance how many minutes can you walk? 15 ability to climb steps?  yes do you drive?  yes  Function I need assistance with the following:  meal prep, household duties and shopping  Neuro/Psych weakness numbness tremor tingling trouble walking spasms dizziness confusion depression anxiety  Prior Studies Any changes since last visit?  yes  Stress test at [facility]  Physicians involved in your care Any changes since last visit?  no   Family History  Problem Relation Age of Onset  . Diabetes type II Mother   . Colon polyps Mother   . Diverticulitis Mother   . Diabetes Mother   . Heart disease Mother   . Diabetes type II Brother   . Uterine cancer    . Ovarian cancer     Social History   Social History  . Marital status: Married    Spouse name: N/A  . Number of children: 2  . Years of education: N/A   Occupational History  . Disability Unemployed   Social  History Main Topics  . Smoking status: Former Smoker    Types: Cigarettes    Quit date: 06/30/2011  . Smokeless tobacco: Never Used  . Alcohol use No  . Drug use: No  . Sexual activity: Yes   Other Topics Concern  . None   Social History Narrative  . None   Past Surgical History:  Procedure Laterality Date  . CESAREAN SECTION    . Left elbow surgery    . PARTIAL HYSTERECTOMY    . TONSILLECTOMY     Past Medical History:  Diagnosis Date  . Anxiety   . Chronic pain    Dr. Letta Pate  . Degenerative disc disease    Spinal, small syrinx  . Depression   . Esophageal stricture    Status post dilatation  . Fibromyalgia   . GERD (gastroesophageal reflux disease)   . Hiatal hernia   . Internal hemorrhoids without mention of complication   . Irritable bowel syndrome   . Palpitations    No documented arrhythmias  . Personal history of colonic polyps 10/04/2000   HYPERPLASTIC POLYP  . POTS (postural orthostatic tachycardia syndrome)   . Syncope    Negative tilt table test  . Syringomyelia and syringobulbia (HCC)    BP 102/67   Pulse 90   Resp 14   SpO2 98%   Opioid Risk Score:   Fall Risk Score:  `1  Depression screen PHQ 2/9  Depression screen [facility] 2/9 05/26/2016 02/24/2016 04/23/2015 03/26/2015 02/22/2015 10/22/2014  Decreased Interest 1 1 1 1 1 2   Down, Depressed, Hopeless 1 1 1 1 1 3   PHQ - 2 Score 2 2 2 2 2 5   Altered sleeping - - - - - 2  Tired, decreased energy - - - - - 3  Change in appetite - - - - - 0  Feeling bad or failure about yourself  - - - - - 2  Trouble concentrating - - - - - 1  Moving slowly or fidgety/restless - - - - - 2  Suicidal thoughts - - - - - 0  PHQ-9 Score - - - - - 15   Review of Systems  All other systems reviewed and are negative.      Objective:   Physical Exam  Constitutional: She is oriented to person, place, and time. She appears well-developed and well-nourished.  HENT:  Head: Normocephalic and atraumatic.  Eyes:  Conjunctivae are normal. Pupils are equal, round, and reactive to light.  Neck: Normal range of motion. Neck supple.  Cardiovascular: Normal rate and regular rhythm.   Pulmonary/Chest: Effort normal and breath sounds normal.  Abdominal: Soft. Bowel sounds are normal.  Musculoskeletal: Normal range of motion.  Neurological: She is alert and oriented to person, place, and time.  Skin: Skin is warm and dry.  Psychiatric: Her mood appears anxious.  Nursing note and vitals reviewed.  Mild tenderness. Palpation along the cervical, thoracic and lumbar paraspinal. There is no spinal deformity. She has 75% range for flexion, extension, lateral rotation and bending. Her motor strength is 5/5 bilateral deltoid, biceps, triceps, grip, hip flexor, knee extensor, ankle dorsiflexor. Negative straight leg raising       Assessment & Plan:  1. Lumbar DDD,  Cont hydrocodone 10/325 2 po TID Continue opioid monitoring program. This consists of regular clinic visits, examinations, urine drug screen, pill counts as well as use of [place] controlled substance reporting System. We'll trial Nucynta ER 100 mg twice a day For the first 5 days, will continue the hydrocodone, but reduced to 10 mg 3 times a day, then stop Follow-up in one week, may need to adjust Nucynta  To 164m  2.  Cervical spondylosis  Patient has no evidence for radiculopathy, into new pain medications as above  3.  Anxiety regarding physical condition  Psychiatry  4.  POTS   F/u cardiology, [facility] 5.  Left wrist pain, exam unremarkable, Neurovascular intact  No point tenderness,No further workup at this time  6. Fibromyalgia syndrome. She does have migratory pain, which I think is attributable. Nucynta should be more helpful than a standard narcotic analgesic

## 2016-06-03 ENCOUNTER — Ambulatory Visit: Payer: Medicare Other | Admitting: Registered Nurse

## 2016-06-03 ENCOUNTER — Telehealth: Payer: Self-pay | Admitting: Registered Nurse

## 2016-06-03 MED ORDER — HYDROCODONE-ACETAMINOPHEN 10-325 MG PO TABS: ORAL_TABLET | ORAL | 0 refills | Status: DC

## 2016-06-03 NOTE — Telephone Encounter (Signed)
Ms. Fulco arrived to office stating she tried  the [place]. While taking the medication she developed headaches and insomnia. Also states her pain was not under control and was experiencing increase intensity of low back pain.  According to [place] was picked up on 05/26/2016.   Medication destroyed per protocol.

## 2016-06-16 ENCOUNTER — Ambulatory Visit: Payer: Medicare Other | Admitting: Physical Medicine & Rehabilitation

## 2016-07-03 ENCOUNTER — Encounter: Payer: Medicare Other | Attending: Physical Medicine & Rehabilitation | Admitting: Registered Nurse

## 2016-07-03 ENCOUNTER — Encounter: Payer: Self-pay | Admitting: Registered Nurse

## 2016-07-03 VITALS — BP 116/77 | HR 90

## 2016-07-03 DIAGNOSIS — K222 Esophageal obstruction: Secondary | ICD-10-CM | POA: Insufficient documentation

## 2016-07-03 DIAGNOSIS — R55 Syncope and collapse: Secondary | ICD-10-CM | POA: Insufficient documentation

## 2016-07-03 DIAGNOSIS — K449 Diaphragmatic hernia without obstruction or gangrene: Secondary | ICD-10-CM | POA: Insufficient documentation

## 2016-07-03 DIAGNOSIS — M797 Fibromyalgia: Secondary | ICD-10-CM

## 2016-07-03 DIAGNOSIS — F329 Major depressive disorder, single episode, unspecified: Secondary | ICD-10-CM | POA: Insufficient documentation

## 2016-07-03 DIAGNOSIS — Z599 Problem related to housing and economic circumstances, unspecified: Secondary | ICD-10-CM | POA: Insufficient documentation

## 2016-07-03 DIAGNOSIS — M47814 Spondylosis without myelopathy or radiculopathy, thoracic region: Secondary | ICD-10-CM | POA: Diagnosis not present

## 2016-07-03 DIAGNOSIS — K648 Other hemorrhoids: Secondary | ICD-10-CM | POA: Diagnosis not present

## 2016-07-03 DIAGNOSIS — I951 Orthostatic hypotension: Secondary | ICD-10-CM | POA: Diagnosis not present

## 2016-07-03 DIAGNOSIS — M542 Cervicalgia: Secondary | ICD-10-CM | POA: Insufficient documentation

## 2016-07-03 DIAGNOSIS — M47817 Spondylosis without myelopathy or radiculopathy, lumbosacral region: Secondary | ICD-10-CM

## 2016-07-03 DIAGNOSIS — R002 Palpitations: Secondary | ICD-10-CM | POA: Diagnosis not present

## 2016-07-03 DIAGNOSIS — Z760 Encounter for issue of repeat prescription: Secondary | ICD-10-CM | POA: Diagnosis not present

## 2016-07-03 DIAGNOSIS — Z5181 Encounter for therapeutic drug level monitoring: Secondary | ICD-10-CM | POA: Diagnosis not present

## 2016-07-03 DIAGNOSIS — K219 Gastro-esophageal reflux disease without esophagitis: Secondary | ICD-10-CM | POA: Diagnosis not present

## 2016-07-03 DIAGNOSIS — R51 Headache: Secondary | ICD-10-CM | POA: Insufficient documentation

## 2016-07-03 DIAGNOSIS — Z86010 Personal history of colonic polyps: Secondary | ICD-10-CM | POA: Diagnosis not present

## 2016-07-03 DIAGNOSIS — Z79899 Other long term (current) drug therapy: Secondary | ICD-10-CM

## 2016-07-03 DIAGNOSIS — Z87891 Personal history of nicotine dependence: Secondary | ICD-10-CM | POA: Insufficient documentation

## 2016-07-03 DIAGNOSIS — M5136 Other intervertebral disc degeneration, lumbar region: Secondary | ICD-10-CM | POA: Diagnosis not present

## 2016-07-03 DIAGNOSIS — R000 Tachycardia, unspecified: Secondary | ICD-10-CM | POA: Insufficient documentation

## 2016-07-03 DIAGNOSIS — K0889 Other specified disorders of teeth and supporting structures: Secondary | ICD-10-CM | POA: Insufficient documentation

## 2016-07-03 DIAGNOSIS — M47892 Other spondylosis, cervical region: Secondary | ICD-10-CM | POA: Diagnosis not present

## 2016-07-03 DIAGNOSIS — Z1389 Encounter for screening for other disorder: Secondary | ICD-10-CM | POA: Diagnosis not present

## 2016-07-03 DIAGNOSIS — M545 Low back pain: Secondary | ICD-10-CM | POA: Insufficient documentation

## 2016-07-03 DIAGNOSIS — M3500 Sicca syndrome, unspecified: Secondary | ICD-10-CM | POA: Diagnosis not present

## 2016-07-03 DIAGNOSIS — K589 Irritable bowel syndrome without diarrhea: Secondary | ICD-10-CM | POA: Diagnosis not present

## 2016-07-03 DIAGNOSIS — G894 Chronic pain syndrome: Secondary | ICD-10-CM

## 2016-07-03 DIAGNOSIS — G8929 Other chronic pain: Secondary | ICD-10-CM | POA: Diagnosis not present

## 2016-07-03 DIAGNOSIS — F419 Anxiety disorder, unspecified: Secondary | ICD-10-CM | POA: Insufficient documentation

## 2016-07-03 DIAGNOSIS — Z23 Encounter for immunization: Secondary | ICD-10-CM | POA: Diagnosis not present

## 2016-07-03 MED ORDER — HYDROCODONE-ACETAMINOPHEN 10-325 MG PO TABS: ORAL_TABLET | ORAL | 0 refills | Status: DC

## 2016-07-03 NOTE — Progress Notes (Signed)
Subjective:    Patient ID: [name], female    DOB: [birth year], 40 y.o.   MRN: [id number]  HPI: [name] is a 40 year old female who returns for follow up for chronic pain and medication refill. She states her pain is located in her lmid- back. She rates her pain 6. Her current exercise regime is walking and performing stretching exercises. Also states 11/2 week ago she noticed right breast discharged and a few months ago her right breast was enlarged.  Right breast is larger than left. She has a PCP visit today with Dr. Gerarda Fraction and Gynecologist on 07/29/2016.   Pain Inventory Average Pain 7 Pain Right Now 6 My pain is intermittent, sharp, burning, dull, stabbing, tingling and aching  In the last 24 hours, has pain interfered with the following? General activity 7 Relation with others 7 Enjoyment of life 7 What TIME of day is your pain at its worst? all Sleep (in general) Fair  Pain is worse with: walking, bending, sitting, inactivity and standing Pain improves with: rest, heat/ice, medication and TENS Relief from Meds: 7  Mobility walk without assistance ability to climb steps?  yes do you drive?  yes  Function disabled: date disabled 2008 I need assistance with the following:  meal prep, household duties and shopping  Neuro/Psych weakness numbness tremor tingling spasms dizziness confusion depression anxiety  Prior Studies Any changes since last visit?  no  Physicians involved in your care Any changes since last visit?  no   Family History  Problem Relation Age of Onset  . Diabetes type II Mother   . Colon polyps Mother   . Diverticulitis Mother   . Diabetes Mother   . Heart disease Mother   . Diabetes type II Brother   . Uterine cancer    . Ovarian cancer     Social History   Social History  . Marital status: Married    Spouse name: N/A  . Number of children: 2  . Years of education: N/A   Occupational History  . Disability  Unemployed   Social History Main Topics  . Smoking status: Former Smoker    Types: Cigarettes    Quit date: 06/30/2011  . Smokeless tobacco: Never Used  . Alcohol use No  . Drug use: No  . Sexual activity: Yes   Other Topics Concern  . Not on file   Social History Narrative  . No narrative on file   Past Surgical History:  Procedure Laterality Date  . CESAREAN SECTION    . Left elbow surgery    . PARTIAL HYSTERECTOMY    . TONSILLECTOMY     Past Medical History:  Diagnosis Date  . Anxiety   . Chronic pain    Dr. Letta Pate  . Degenerative disc disease    Spinal, small syrinx  . Depression   . Esophageal stricture    Status post dilatation  . Fibromyalgia   . GERD (gastroesophageal reflux disease)   . Hiatal hernia   . Internal hemorrhoids without mention of complication   . Irritable bowel syndrome   . Palpitations    No documented arrhythmias  . Personal history of colonic polyps 10/04/2000   HYPERPLASTIC POLYP  . POTS (postural orthostatic tachycardia syndrome)   . Syncope    Negative tilt table test  . Syringomyelia and syringobulbia ([place])    There were no vitals taken for this visit.  Opioid Risk Score:   Fall Risk Score:  `  1  Depression screen PHQ 2/9  Depression screen [facility] 2/9 05/26/2016 02/24/2016 04/23/2015 03/26/2015 02/22/2015 10/22/2014  Decreased Interest 1 1 1 1 1 2   Down, Depressed, Hopeless 1 1 1 1 1 3   PHQ - 2 Score 2 2 2 2 2 5   Altered sleeping - - - - - 2  Tired, decreased energy - - - - - 3  Change in appetite - - - - - 0  Feeling bad or failure about yourself  - - - - - 2  Trouble concentrating - - - - - 1  Moving slowly or fidgety/restless - - - - - 2  Suicidal thoughts - - - - - 0  PHQ-9 Score - - - - - 15   Review of Systems  HENT: Negative.   Eyes: Negative.   Respiratory: Negative.   Cardiovascular: Negative.   Gastrointestinal: Negative.   Endocrine: Negative.   Genitourinary: Negative.   Musculoskeletal: Positive for  arthralgias and back pain.  Skin: Negative.   Allergic/Immunologic: Negative.   Neurological: Positive for dizziness, tremors, weakness and numbness.  Hematological: Negative.   Psychiatric/Behavioral: Positive for confusion and dysphoric mood. The patient is nervous/anxious.   All other systems reviewed and are negative.      Objective:   Physical Exam  Constitutional: She is oriented to person, place, and time. She appears well-developed and well-nourished.  HENT:  Head: Normocephalic and atraumatic.  Neck: Normal range of motion. Neck supple.  Cardiovascular: Normal rate.   Pulmonary/Chest: Effort normal and breath sounds normal.  Musculoskeletal:  Normal Muscle Bulk and Muscle Testing Reveals: Upper Extremities: Full ROM and Muscle Strength 5/5 No Axilla Tenderness Noted Without spinal tenderness noted Lower Extremities: Full ROM and Muscle Strength 5/5 Arises from table with ease Narrow Based gait   Neurological: She is alert and oriented to person, place, and time.  Skin: Skin is warm and dry.  Psychiatric: She has a normal mood and affect.  Nursing note and vitals reviewed.         Assessment & Plan:  1. Lumbar degenerative disc disease:  Refilled: HYDROcodone 10/325 mg two tablets every 8 hours as needed #180. We will continue the opioid monitoring program, this consists of regular clinic visits, examinations, urine drug screen, pill counts as well as use of [place] Controlled Substance Reporting System.  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  3. Fibromyalgia: Continue with Heat and exercise therapy.  4.Depression and anxiety: Psychiatry Following  5. Orthostatic hypotension: Cardiology Following   20 minutes of face to face patient care time was spent during this visit. All questions were encouraged and answered.

## 2016-07-09 LAB — TOXASSURE SELECT,+ANTIDEPR,UR

## 2016-07-29 ENCOUNTER — Ambulatory Visit: Payer: Self-pay | Admitting: Gynecology

## 2016-07-29 DIAGNOSIS — Z0289 Encounter for other administrative examinations: Secondary | ICD-10-CM

## 2016-08-04 ENCOUNTER — Encounter: Payer: Self-pay | Admitting: Registered Nurse

## 2016-08-04 ENCOUNTER — Encounter: Payer: Medicare Other | Attending: Physical Medicine & Rehabilitation | Admitting: Registered Nurse

## 2016-08-04 VITALS — BP 113/76 | HR 79 | Resp 14

## 2016-08-04 DIAGNOSIS — K589 Irritable bowel syndrome without diarrhea: Secondary | ICD-10-CM | POA: Insufficient documentation

## 2016-08-04 DIAGNOSIS — M47817 Spondylosis without myelopathy or radiculopathy, lumbosacral region: Secondary | ICD-10-CM

## 2016-08-04 DIAGNOSIS — R000 Tachycardia, unspecified: Secondary | ICD-10-CM | POA: Insufficient documentation

## 2016-08-04 DIAGNOSIS — K648 Other hemorrhoids: Secondary | ICD-10-CM | POA: Insufficient documentation

## 2016-08-04 DIAGNOSIS — Z86010 Personal history of colonic polyps: Secondary | ICD-10-CM | POA: Diagnosis not present

## 2016-08-04 DIAGNOSIS — Z79899 Other long term (current) drug therapy: Secondary | ICD-10-CM

## 2016-08-04 DIAGNOSIS — I951 Orthostatic hypotension: Secondary | ICD-10-CM | POA: Diagnosis not present

## 2016-08-04 DIAGNOSIS — G8929 Other chronic pain: Secondary | ICD-10-CM | POA: Insufficient documentation

## 2016-08-04 DIAGNOSIS — R51 Headache: Secondary | ICD-10-CM | POA: Diagnosis not present

## 2016-08-04 DIAGNOSIS — Z599 Problem related to housing and economic circumstances, unspecified: Secondary | ICD-10-CM | POA: Insufficient documentation

## 2016-08-04 DIAGNOSIS — G894 Chronic pain syndrome: Secondary | ICD-10-CM | POA: Diagnosis not present

## 2016-08-04 DIAGNOSIS — K222 Esophageal obstruction: Secondary | ICD-10-CM | POA: Insufficient documentation

## 2016-08-04 DIAGNOSIS — F329 Major depressive disorder, single episode, unspecified: Secondary | ICD-10-CM | POA: Insufficient documentation

## 2016-08-04 DIAGNOSIS — R002 Palpitations: Secondary | ICD-10-CM | POA: Diagnosis not present

## 2016-08-04 DIAGNOSIS — M47812 Spondylosis without myelopathy or radiculopathy, cervical region: Secondary | ICD-10-CM

## 2016-08-04 DIAGNOSIS — Z87891 Personal history of nicotine dependence: Secondary | ICD-10-CM | POA: Diagnosis not present

## 2016-08-04 DIAGNOSIS — M545 Low back pain: Secondary | ICD-10-CM | POA: Insufficient documentation

## 2016-08-04 DIAGNOSIS — M797 Fibromyalgia: Secondary | ICD-10-CM | POA: Insufficient documentation

## 2016-08-04 DIAGNOSIS — K219 Gastro-esophageal reflux disease without esophagitis: Secondary | ICD-10-CM | POA: Diagnosis not present

## 2016-08-04 DIAGNOSIS — M47814 Spondylosis without myelopathy or radiculopathy, thoracic region: Secondary | ICD-10-CM | POA: Diagnosis not present

## 2016-08-04 DIAGNOSIS — M47892 Other spondylosis, cervical region: Secondary | ICD-10-CM | POA: Insufficient documentation

## 2016-08-04 DIAGNOSIS — K449 Diaphragmatic hernia without obstruction or gangrene: Secondary | ICD-10-CM | POA: Insufficient documentation

## 2016-08-04 DIAGNOSIS — K0889 Other specified disorders of teeth and supporting structures: Secondary | ICD-10-CM | POA: Insufficient documentation

## 2016-08-04 DIAGNOSIS — Z760 Encounter for issue of repeat prescription: Secondary | ICD-10-CM | POA: Insufficient documentation

## 2016-08-04 DIAGNOSIS — R55 Syncope and collapse: Secondary | ICD-10-CM | POA: Diagnosis not present

## 2016-08-04 DIAGNOSIS — F419 Anxiety disorder, unspecified: Secondary | ICD-10-CM | POA: Diagnosis not present

## 2016-08-04 DIAGNOSIS — G44229 Chronic tension-type headache, not intractable: Secondary | ICD-10-CM

## 2016-08-04 DIAGNOSIS — M5136 Other intervertebral disc degeneration, lumbar region: Secondary | ICD-10-CM | POA: Insufficient documentation

## 2016-08-04 DIAGNOSIS — Z5181 Encounter for therapeutic drug level monitoring: Secondary | ICD-10-CM

## 2016-08-04 DIAGNOSIS — M542 Cervicalgia: Secondary | ICD-10-CM | POA: Insufficient documentation

## 2016-08-04 MED ORDER — HYDROCODONE-ACETAMINOPHEN 10-325 MG PO TABS: ORAL_TABLET | ORAL | 0 refills | Status: DC

## 2016-08-04 NOTE — Progress Notes (Signed)
Subjective:    Patient ID: [name], female    DOB: 1976/07/05, 41 y.o.   MRN: [id number]  HPI:  [name] is a 41year old female who returns for follow up appointment for chronic pain and medication refill. She states her pain is located in her neck and lower back. Also states she has a headache today, her aunt and uncle passed away last month suddenly emotional support given. Also states her brother has been hospitalized at [facility]. She states she realizes her headache is related to family stressors, the Hydrocodone is helping her headaches she states. Instructed to call office with any questions and concerns. She rates her pain 4. Her current exercise regime is walking and performing stretching exercises.  Pain Inventory Average Pain 6 Pain Right Now 4 My pain is intermittent, sharp, burning, dull, stabbing, tingling and aching  In the last 24 hours, has pain interfered with the following? General activity 8 Relation with others 8 Enjoyment of life 8 What TIME of day is your pain at its worst? morning, evening, night Sleep (in general) Fair  Pain is worse with: walking, bending, sitting, inactivity, standing and some activites Pain improves with: rest, heat/ice, medication and TENS Relief from Meds: 7  Mobility walk without assistance ability to climb steps?  yes do you drive?  yes  Function disabled: date disabled n/a  Neuro/Psych No problems in this area  Prior Studies Any changes since last visit?  no  Physicians involved in your care Any changes since last visit?  no   Family History  Problem Relation Age of Onset  . Diabetes type II Mother   . Colon polyps Mother   . Diverticulitis Mother   . Diabetes Mother   . Heart disease Mother   . Diabetes type II Brother   . Uterine cancer    . Ovarian cancer     Social History   Social History  . Marital status: Married    Spouse name: N/A  . Number of children: 2  . Years of  education: N/A   Occupational History  . Disability Unemployed   Social History Main Topics  . Smoking status: Former Smoker    Types: Cigarettes    Quit date: 06/30/2011  . Smokeless tobacco: Never Used  . Alcohol use No  . Drug use: No  . Sexual activity: Yes   Other Topics Concern  . None   Social History Narrative  . None   Past Surgical History:  Procedure Laterality Date  . CESAREAN SECTION    . Left elbow surgery    . PARTIAL HYSTERECTOMY    . TONSILLECTOMY     Past Medical History:  Diagnosis Date  . Anxiety   . Chronic pain    Dr. Letta Pate  . Degenerative disc disease    Spinal, small syrinx  . Depression   . Esophageal stricture    Status post dilatation  . Fibromyalgia   . GERD (gastroesophageal reflux disease)   . Hiatal hernia   . Internal hemorrhoids without mention of complication   . Irritable bowel syndrome   . Palpitations    No documented arrhythmias  . Personal history of colonic polyps 10/04/2000   HYPERPLASTIC POLYP  . POTS (postural orthostatic tachycardia syndrome)   . Syncope    Negative tilt table test  . Syringomyelia and syringobulbia (HCC)    BP 113/76   Pulse 79   Resp 14   SpO2 98%  Opioid Risk Score:   Fall Risk Score:  `1  Depression screen PHQ 2/9  Depression screen [facility] 2/9 05/26/2016 02/24/2016 04/23/2015 03/26/2015 02/22/2015 10/22/2014  Decreased Interest 1 1 1 1 1 2   Down, Depressed, Hopeless 1 1 1 1 1 3   PHQ - 2 Score 2 2 2 2 2 5   Altered sleeping - - - - - 2  Tired, decreased energy - - - - - 3  Change in appetite - - - - - 0  Feeling bad or failure about yourself  - - - - - 2  Trouble concentrating - - - - - 1  Moving slowly or fidgety/restless - - - - - 2  Suicidal thoughts - - - - - 0  PHQ-9 Score - - - - - 15     Review of Systems  Constitutional: Negative.   HENT: Negative.   Eyes: Negative.   Respiratory: Negative.   Cardiovascular: Negative.   Gastrointestinal: Negative.   Endocrine: Negative.    Genitourinary: Negative.   Musculoskeletal: Negative.   Skin: Negative.   Allergic/Immunologic: Negative.   Neurological: Negative.   Hematological: Negative.   Psychiatric/Behavioral: Negative.   All other systems reviewed and are negative.      Objective:   Physical Exam  Constitutional: She is oriented to person, place, and time. She appears well-developed and well-nourished.  HENT:  Head: Normocephalic and atraumatic.  Neck: Normal range of motion. Neck supple.  No Cervical Tenderness Noted  Cardiovascular: Normal rate and regular rhythm.   Pulmonary/Chest: Effort normal and breath sounds normal.  Musculoskeletal:  Normal Muscle Bulk and Muscle Testing Reveals: Upper Extremities: Full ROM and Muscle Strength 5/5 Back without spinal Tenderness  Lower Extremities: Full ROM and Muscle Strength 5/5 Arises from Table with Ease  Narrow Based Gait  Neurological: She is alert and oriented to person, place, and time.  Skin: Skin is warm and dry.  Psychiatric: She has a normal mood and affect.  Nursing note and vitals reviewed.         Assessment & Plan:  1. Lumbar degenerative disc disease:  Refilled: HYDROcodone 10/325 mg two tablets every 8 hours as needed #180. We will continue the opioid monitoring program, this consists of regular clinic visits, examinations, urine drug screen, pill counts as well as use of [place] Controlled Substance Reporting System.  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  3. Fibromyalgia: Continue with Heat and exercise therapy.  4.Depression and anxiety: Psychiatry Following  5. Orthostatic hypotension: Cardiology Following   20 minutes of face to face patient care time was spent during this visit. All questions were encouraged and answered.   F/U in 1 month

## 2016-08-28 ENCOUNTER — Encounter (HOSPITAL_BASED_OUTPATIENT_CLINIC_OR_DEPARTMENT_OTHER): Payer: Medicare Other | Admitting: Registered Nurse

## 2016-08-28 ENCOUNTER — Encounter: Payer: Self-pay | Admitting: Registered Nurse

## 2016-08-28 VITALS — BP 110/71 | HR 98

## 2016-08-28 DIAGNOSIS — K589 Irritable bowel syndrome without diarrhea: Secondary | ICD-10-CM | POA: Diagnosis not present

## 2016-08-28 DIAGNOSIS — R51 Headache: Secondary | ICD-10-CM | POA: Diagnosis not present

## 2016-08-28 DIAGNOSIS — M5136 Other intervertebral disc degeneration, lumbar region: Secondary | ICD-10-CM | POA: Diagnosis not present

## 2016-08-28 DIAGNOSIS — M47814 Spondylosis without myelopathy or radiculopathy, thoracic region: Secondary | ICD-10-CM | POA: Diagnosis not present

## 2016-08-28 DIAGNOSIS — G8929 Other chronic pain: Secondary | ICD-10-CM | POA: Diagnosis not present

## 2016-08-28 DIAGNOSIS — K0889 Other specified disorders of teeth and supporting structures: Secondary | ICD-10-CM | POA: Diagnosis not present

## 2016-08-28 DIAGNOSIS — Z599 Problem related to housing and economic circumstances, unspecified: Secondary | ICD-10-CM | POA: Diagnosis not present

## 2016-08-28 DIAGNOSIS — I951 Orthostatic hypotension: Secondary | ICD-10-CM | POA: Diagnosis not present

## 2016-08-28 DIAGNOSIS — M47812 Spondylosis without myelopathy or radiculopathy, cervical region: Secondary | ICD-10-CM

## 2016-08-28 DIAGNOSIS — G894 Chronic pain syndrome: Secondary | ICD-10-CM | POA: Diagnosis not present

## 2016-08-28 DIAGNOSIS — R002 Palpitations: Secondary | ICD-10-CM | POA: Diagnosis not present

## 2016-08-28 DIAGNOSIS — Z760 Encounter for issue of repeat prescription: Secondary | ICD-10-CM | POA: Diagnosis not present

## 2016-08-28 DIAGNOSIS — R000 Tachycardia, unspecified: Secondary | ICD-10-CM | POA: Diagnosis not present

## 2016-08-28 DIAGNOSIS — M47892 Other spondylosis, cervical region: Secondary | ICD-10-CM | POA: Diagnosis not present

## 2016-08-28 DIAGNOSIS — Z79899 Other long term (current) drug therapy: Secondary | ICD-10-CM

## 2016-08-28 DIAGNOSIS — K449 Diaphragmatic hernia without obstruction or gangrene: Secondary | ICD-10-CM | POA: Diagnosis not present

## 2016-08-28 DIAGNOSIS — Z87891 Personal history of nicotine dependence: Secondary | ICD-10-CM | POA: Diagnosis not present

## 2016-08-28 DIAGNOSIS — Z86010 Personal history of colonic polyps: Secondary | ICD-10-CM | POA: Diagnosis not present

## 2016-08-28 DIAGNOSIS — M545 Low back pain: Secondary | ICD-10-CM | POA: Diagnosis not present

## 2016-08-28 DIAGNOSIS — M542 Cervicalgia: Secondary | ICD-10-CM | POA: Diagnosis not present

## 2016-08-28 DIAGNOSIS — R55 Syncope and collapse: Secondary | ICD-10-CM | POA: Diagnosis not present

## 2016-08-28 DIAGNOSIS — M47817 Spondylosis without myelopathy or radiculopathy, lumbosacral region: Secondary | ICD-10-CM

## 2016-08-28 DIAGNOSIS — K219 Gastro-esophageal reflux disease without esophagitis: Secondary | ICD-10-CM | POA: Diagnosis not present

## 2016-08-28 DIAGNOSIS — M797 Fibromyalgia: Secondary | ICD-10-CM

## 2016-08-28 DIAGNOSIS — Z5181 Encounter for therapeutic drug level monitoring: Secondary | ICD-10-CM

## 2016-08-28 DIAGNOSIS — K222 Esophageal obstruction: Secondary | ICD-10-CM | POA: Diagnosis not present

## 2016-08-28 DIAGNOSIS — K648 Other hemorrhoids: Secondary | ICD-10-CM | POA: Diagnosis not present

## 2016-08-28 MED ORDER — HYDROCODONE-ACETAMINOPHEN 10-325 MG PO TABS: ORAL_TABLET | ORAL | 0 refills | Status: DC

## 2016-08-28 NOTE — Progress Notes (Signed)
Subjective:    Patient ID: [name], female    DOB: [birth year], 41 y.o.   MRN: [id number]  HPI: [name] is a 41year old female who returns for follow up appointment for chronic pain and medication refill. She states her pain is located in her neck and upper-lower back.  She rates her pain 7. Her current exercise regime is walking and performing stretching exercises. Also sates about a week ago she was laying on her couch reaching for her remote, all of the sudden she was unable to move her extremities or talk. She states she spoke to Dr. Sabra Heck and he stated it was Sleep Paralysis. She will F/U with her PCP and Neurology. Also stated this was only one episode and she believes it lasted for 27 minutes. Encouraged to F/U with Neurolgy, she verbalizes understanding.   Pain Inventory Average Pain 6 Pain Right Now 7 My pain is intermittent, sharp, burning, dull, stabbing, tingling and aching  In the last 24 hours, has pain interfered with the following? General activity 7 Relation with others 7 Enjoyment of life 8 What TIME of day is your pain at its worst? morning, evening, night Sleep (in general) Fair  Pain is worse with: walking, bending, sitting, inactivity and standing Pain improves with: rest, heat/ice, medication and TENS Relief from Meds: 7  Mobility walk without assistance how many minutes can you walk? 20 ability to climb steps?  yes do you drive?  yes  Function disabled: date disabled n/a I need assistance with the following:  meal prep, household duties and shopping  Neuro/Psych weakness numbness tremor tingling  Prior Studies Any changes since last visit?  no  Physicians involved in your care Any changes since last visit?  no   Family History  Problem Relation Age of Onset  . Diabetes type II Mother   . Colon polyps Mother   . Diverticulitis Mother   . Diabetes Mother   . Heart disease Mother   . Diabetes type II Brother   .  Uterine cancer    . Ovarian cancer     Social History   Social History  . Marital status: Married    Spouse name: N/A  . Number of children: 2  . Years of education: N/A   Occupational History  . Disability Unemployed   Social History Main Topics  . Smoking status: Former Smoker    Types: Cigarettes    Quit date: 06/30/2011  . Smokeless tobacco: Never Used  . Alcohol use No  . Drug use: No  . Sexual activity: Yes   Other Topics Concern  . None   Social History Narrative  . None   Past Surgical History:  Procedure Laterality Date  . CESAREAN SECTION    . Left elbow surgery    . PARTIAL HYSTERECTOMY    . TONSILLECTOMY     Past Medical History:  Diagnosis Date  . Anxiety   . Chronic pain    Dr. Letta Pate  . Degenerative disc disease    Spinal, small syrinx  . Depression   . Esophageal stricture    Status post dilatation  . Fibromyalgia   . GERD (gastroesophageal reflux disease)   . Hiatal hernia   . Internal hemorrhoids without mention of complication   . Irritable bowel syndrome   . Palpitations    No documented arrhythmias  . Personal history of colonic polyps 10/04/2000   HYPERPLASTIC POLYP  . POTS (postural orthostatic tachycardia syndrome)   .  Syncope    Negative tilt table test  . Syringomyelia and syringobulbia (HCC)    BP 110/71 (BP Location: Right Arm, Patient Position: Sitting, Cuff Size: Normal)   Pulse 98   SpO2 97%   Opioid Risk Score:   Fall Risk Score:  `1  Depression screen PHQ 2/9  Depression screen [facility] 2/9 05/26/2016 02/24/2016 04/23/2015 03/26/2015 02/22/2015 10/22/2014  Decreased Interest 1 1 1 1 1 2   Down, Depressed, Hopeless 1 1 1 1 1 3   PHQ - 2 Score 2 2 2 2 2 5   Altered sleeping - - - - - 2  Tired, decreased energy - - - - - 3  Change in appetite - - - - - 0  Feeling bad or failure about yourself  - - - - - 2  Trouble concentrating - - - - - 1  Moving slowly or fidgety/restless - - - - - 2  Suicidal thoughts - - - - - 0    PHQ-9 Score - - - - - 15       Review of Systems  Constitutional: Positive for chills.  HENT: Negative.   Eyes: Negative.   Respiratory: Negative.   Cardiovascular: Negative.   Gastrointestinal: Positive for constipation, diarrhea and nausea.  Endocrine: Negative.   Genitourinary: Negative.   Musculoskeletal: Negative.   Skin: Positive for rash.  Allergic/Immunologic: Negative.   Neurological: Negative.   Hematological: Negative.   Psychiatric/Behavioral: Negative.   All other systems reviewed and are negative.      Objective:   Physical Exam  Constitutional: She is oriented to person, place, and time. She appears well-developed and well-nourished.  HENT:  Head: Normocephalic and atraumatic.  Neck: Normal range of motion. Neck supple.  Cardiovascular: Normal rate and regular rhythm.   Pulmonary/Chest: Effort normal and breath sounds normal.  Musculoskeletal:  Normal Muscle Bulk and Muscle Testing Reveals: Upper Extremities: Full ROM and Muscle Strength 5/5 Thoracic Paraspinal Tenderness: T-7-T-9 Lower Extremities: Full ROM and Muscle Strength 5/5 Arises from Table with ease Narrow Based Gait  Neurological: She is alert and oriented to person, place, and time.  Skin: Skin is warm and dry.  Psychiatric: She has a normal mood and affect.  Nursing note and vitals reviewed.         Assessment & Plan:  1. Lumbar degenerative disc disease:  Refilled: HYDROcodone 10/325 mg two tablets every 8 hours as needed #180. We will continue the opioid monitoring program, this consists of regular clinic visits, examinations, urine drug screen, pill counts as well as use of [place] Controlled Substance Reporting System.  2. Thoracic Spondylosis: Continue current medication regime 3 Cervical spondylosis without evidence of myelopathy: Continue with exercise and heat therapy.  4. Fibromyalgia: Continue with Heat and exercise therapy.  5.Depression and anxiety:  Psychiatry Following  6. Orthostatic hypotension: Cardiology Following   20 minutes of face to face patient care time was spent during this visit. All questions were encouraged and answered.   F/U in 1 month

## 2016-09-25 ENCOUNTER — Encounter: Payer: Medicare Other | Attending: Physical Medicine & Rehabilitation | Admitting: Registered Nurse

## 2016-09-25 DIAGNOSIS — K449 Diaphragmatic hernia without obstruction or gangrene: Secondary | ICD-10-CM | POA: Insufficient documentation

## 2016-09-25 DIAGNOSIS — K0889 Other specified disorders of teeth and supporting structures: Secondary | ICD-10-CM | POA: Insufficient documentation

## 2016-09-25 DIAGNOSIS — Z760 Encounter for issue of repeat prescription: Secondary | ICD-10-CM | POA: Insufficient documentation

## 2016-09-25 DIAGNOSIS — Z599 Problem related to housing and economic circumstances, unspecified: Secondary | ICD-10-CM | POA: Insufficient documentation

## 2016-09-25 DIAGNOSIS — Z86010 Personal history of colonic polyps: Secondary | ICD-10-CM | POA: Insufficient documentation

## 2016-09-25 DIAGNOSIS — M545 Low back pain: Secondary | ICD-10-CM | POA: Insufficient documentation

## 2016-09-25 DIAGNOSIS — M797 Fibromyalgia: Secondary | ICD-10-CM | POA: Insufficient documentation

## 2016-09-25 DIAGNOSIS — F329 Major depressive disorder, single episode, unspecified: Secondary | ICD-10-CM | POA: Insufficient documentation

## 2016-09-25 DIAGNOSIS — M47892 Other spondylosis, cervical region: Secondary | ICD-10-CM | POA: Insufficient documentation

## 2016-09-25 DIAGNOSIS — R51 Headache: Secondary | ICD-10-CM | POA: Insufficient documentation

## 2016-09-25 DIAGNOSIS — K589 Irritable bowel syndrome without diarrhea: Secondary | ICD-10-CM | POA: Insufficient documentation

## 2016-09-25 DIAGNOSIS — K219 Gastro-esophageal reflux disease without esophagitis: Secondary | ICD-10-CM | POA: Insufficient documentation

## 2016-09-25 DIAGNOSIS — G8929 Other chronic pain: Secondary | ICD-10-CM | POA: Insufficient documentation

## 2016-09-25 DIAGNOSIS — K648 Other hemorrhoids: Secondary | ICD-10-CM | POA: Insufficient documentation

## 2016-09-25 DIAGNOSIS — R002 Palpitations: Secondary | ICD-10-CM | POA: Insufficient documentation

## 2016-09-25 DIAGNOSIS — F419 Anxiety disorder, unspecified: Secondary | ICD-10-CM | POA: Insufficient documentation

## 2016-09-25 DIAGNOSIS — R000 Tachycardia, unspecified: Secondary | ICD-10-CM | POA: Insufficient documentation

## 2016-09-25 DIAGNOSIS — M5136 Other intervertebral disc degeneration, lumbar region: Secondary | ICD-10-CM | POA: Insufficient documentation

## 2016-09-25 DIAGNOSIS — I951 Orthostatic hypotension: Secondary | ICD-10-CM | POA: Insufficient documentation

## 2016-09-25 DIAGNOSIS — R55 Syncope and collapse: Secondary | ICD-10-CM | POA: Insufficient documentation

## 2016-09-25 DIAGNOSIS — K222 Esophageal obstruction: Secondary | ICD-10-CM | POA: Insufficient documentation

## 2016-09-25 DIAGNOSIS — M542 Cervicalgia: Secondary | ICD-10-CM | POA: Insufficient documentation

## 2016-09-25 DIAGNOSIS — Z87891 Personal history of nicotine dependence: Secondary | ICD-10-CM | POA: Insufficient documentation

## 2016-10-09 ENCOUNTER — Telehealth: Payer: Self-pay | Admitting: Registered Nurse

## 2016-10-09 ENCOUNTER — Encounter: Payer: Self-pay | Admitting: Registered Nurse

## 2016-10-09 ENCOUNTER — Encounter: Payer: Medicare Other | Attending: Physical Medicine & Rehabilitation | Admitting: Registered Nurse

## 2016-10-09 VITALS — BP 110/68 | HR 76 | Resp 14

## 2016-10-09 DIAGNOSIS — I951 Orthostatic hypotension: Secondary | ICD-10-CM | POA: Diagnosis not present

## 2016-10-09 DIAGNOSIS — K222 Esophageal obstruction: Secondary | ICD-10-CM | POA: Diagnosis not present

## 2016-10-09 DIAGNOSIS — K219 Gastro-esophageal reflux disease without esophagitis: Secondary | ICD-10-CM | POA: Diagnosis not present

## 2016-10-09 DIAGNOSIS — Z86010 Personal history of colonic polyps: Secondary | ICD-10-CM | POA: Insufficient documentation

## 2016-10-09 DIAGNOSIS — M47812 Spondylosis without myelopathy or radiculopathy, cervical region: Secondary | ICD-10-CM | POA: Diagnosis not present

## 2016-10-09 DIAGNOSIS — F329 Major depressive disorder, single episode, unspecified: Secondary | ICD-10-CM | POA: Diagnosis not present

## 2016-10-09 DIAGNOSIS — Z5181 Encounter for therapeutic drug level monitoring: Secondary | ICD-10-CM | POA: Diagnosis not present

## 2016-10-09 DIAGNOSIS — M47814 Spondylosis without myelopathy or radiculopathy, thoracic region: Secondary | ICD-10-CM

## 2016-10-09 DIAGNOSIS — K449 Diaphragmatic hernia without obstruction or gangrene: Secondary | ICD-10-CM | POA: Insufficient documentation

## 2016-10-09 DIAGNOSIS — K0889 Other specified disorders of teeth and supporting structures: Secondary | ICD-10-CM | POA: Diagnosis not present

## 2016-10-09 DIAGNOSIS — Z79899 Other long term (current) drug therapy: Secondary | ICD-10-CM

## 2016-10-09 DIAGNOSIS — K589 Irritable bowel syndrome without diarrhea: Secondary | ICD-10-CM | POA: Insufficient documentation

## 2016-10-09 DIAGNOSIS — R55 Syncope and collapse: Secondary | ICD-10-CM | POA: Diagnosis not present

## 2016-10-09 DIAGNOSIS — R51 Headache: Secondary | ICD-10-CM | POA: Diagnosis not present

## 2016-10-09 DIAGNOSIS — M542 Cervicalgia: Secondary | ICD-10-CM | POA: Diagnosis not present

## 2016-10-09 DIAGNOSIS — M5136 Other intervertebral disc degeneration, lumbar region: Secondary | ICD-10-CM | POA: Insufficient documentation

## 2016-10-09 DIAGNOSIS — R002 Palpitations: Secondary | ICD-10-CM | POA: Insufficient documentation

## 2016-10-09 DIAGNOSIS — Z599 Problem related to housing and economic circumstances, unspecified: Secondary | ICD-10-CM | POA: Insufficient documentation

## 2016-10-09 DIAGNOSIS — G894 Chronic pain syndrome: Secondary | ICD-10-CM

## 2016-10-09 DIAGNOSIS — R000 Tachycardia, unspecified: Secondary | ICD-10-CM | POA: Diagnosis not present

## 2016-10-09 DIAGNOSIS — M47892 Other spondylosis, cervical region: Secondary | ICD-10-CM | POA: Insufficient documentation

## 2016-10-09 DIAGNOSIS — Z87891 Personal history of nicotine dependence: Secondary | ICD-10-CM | POA: Insufficient documentation

## 2016-10-09 DIAGNOSIS — K648 Other hemorrhoids: Secondary | ICD-10-CM | POA: Insufficient documentation

## 2016-10-09 DIAGNOSIS — F419 Anxiety disorder, unspecified: Secondary | ICD-10-CM | POA: Diagnosis not present

## 2016-10-09 DIAGNOSIS — M47817 Spondylosis without myelopathy or radiculopathy, lumbosacral region: Secondary | ICD-10-CM | POA: Diagnosis not present

## 2016-10-09 DIAGNOSIS — M797 Fibromyalgia: Secondary | ICD-10-CM

## 2016-10-09 DIAGNOSIS — M545 Low back pain: Secondary | ICD-10-CM | POA: Diagnosis not present

## 2016-10-09 DIAGNOSIS — G8929 Other chronic pain: Secondary | ICD-10-CM | POA: Diagnosis not present

## 2016-10-09 DIAGNOSIS — Z760 Encounter for issue of repeat prescription: Secondary | ICD-10-CM | POA: Insufficient documentation

## 2016-10-09 DIAGNOSIS — M25532 Pain in left wrist: Secondary | ICD-10-CM

## 2016-10-09 MED ORDER — HYDROCODONE-ACETAMINOPHEN 10-325 MG PO TABS: ORAL_TABLET | ORAL | 0 refills | Status: DC

## 2016-10-09 NOTE — Progress Notes (Signed)
Subjective:    Patient ID: [name], female    DOB: Dec 27, 1975, 41 y.o.   MRN: [id number]  HPI: [name] is a 41year old female who returns for follow up appointmentfor chronic pain and medication refill. She states her pain is located in her neck radiating into her bilateral shoulders, right elbow, right wrist and upper-lower back. She rates her pain 7. Her current exercise regime is walking and performing stretching exercises.  Pain Inventory Average Pain 8 Pain Right Now 7 My pain is intermittent, constant, sharp, burning, dull, stabbing, tingling and aching  In the last 24 hours, has pain interfered with the following? General activity 7 Relation with others 7 Enjoyment of life 7 What TIME of day is your pain at its worst? morning, evening, night Sleep (in general) Fair  Pain is worse with: walking, bending, sitting, inactivity, standing and unsure Pain improves with: rest, heat/ice, medication and TENS Relief from Meds: 7  Mobility walk without assistance ability to climb steps?  yes do you drive?  yes  Function disabled: date disabled . Do you have any goals in this area?  yes  Neuro/Psych numbness tremor tingling spasms confusion depression anxiety  Prior Studies Any changes since last visit?  no  Physicians involved in your care Any changes since last visit?  no   Family History  Problem Relation Age of Onset  . Diabetes type II Mother   . Colon polyps Mother   . Diverticulitis Mother   . Diabetes Mother   . Heart disease Mother   . Diabetes type II Brother   . Uterine cancer    . Ovarian cancer     Social History   Social History  . Marital status: Married    Spouse name: N/A  . Number of children: 2  . Years of education: N/A   Occupational History  . Disability Unemployed   Social History Main Topics  . Smoking status: Former Smoker    Types: Cigarettes    Quit date: 06/30/2011  . Smokeless tobacco: Never Used    . Alcohol use No  . Drug use: No  . Sexual activity: Yes   Other Topics Concern  . None   Social History Narrative  . None   Past Surgical History:  Procedure Laterality Date  . CESAREAN SECTION    . Left elbow surgery    . PARTIAL HYSTERECTOMY    . TONSILLECTOMY     Past Medical History:  Diagnosis Date  . Anxiety   . Chronic pain    Dr. Letta Pate  . Degenerative disc disease    Spinal, small syrinx  . Depression   . Esophageal stricture    Status post dilatation  . Fibromyalgia   . GERD (gastroesophageal reflux disease)   . Hiatal hernia   . Internal hemorrhoids without mention of complication   . Irritable bowel syndrome   . Palpitations    No documented arrhythmias  . Personal history of colonic polyps 10/04/2000   HYPERPLASTIC POLYP  . POTS (postural orthostatic tachycardia syndrome)   . Syncope    Negative tilt table test  . Syringomyelia and syringobulbia (HCC)    BP 110/68   Pulse 76   Resp 14   SpO2 99%   Opioid Risk Score:   Fall Risk Score:  `1  Depression screen PHQ 2/9  Depression screen [facility] 2/9 05/26/2016 02/24/2016 04/23/2015 03/26/2015 02/22/2015 10/22/2014  Decreased Interest 1 1 1 1 1 2   Down,  Depressed, Hopeless 1 1 1 1 1 3   PHQ - 2 Score 2 2 2 2 2 5   Altered sleeping - - - - - 2  Tired, decreased energy - - - - - 3  Change in appetite - - - - - 0  Feeling bad or failure about yourself  - - - - - 2  Trouble concentrating - - - - - 1  Moving slowly or fidgety/restless - - - - - 2  Suicidal thoughts - - - - - 0  PHQ-9 Score - - - - - 15    Review of Systems  Constitutional: Negative.   HENT: Negative.   Eyes: Negative.   Respiratory: Negative.   Cardiovascular: Negative.   Gastrointestinal: Negative.   Endocrine: Negative.   Genitourinary: Negative.   Musculoskeletal: Positive for arthralgias, back pain, myalgias, neck pain and neck stiffness.       Spasms  Skin: Negative.   Allergic/Immunologic: Negative.   Neurological:  Positive for tremors and numbness.       Tingling  Psychiatric/Behavioral: Positive for confusion and dysphoric mood. The patient is nervous/anxious.   All other systems reviewed and are negative.      Objective:   Physical Exam  Constitutional: She is oriented to person, place, and time. She appears well-developed and well-nourished.  HENT:  Head: Normocephalic and atraumatic.  Neck: Neck supple.  Cervical Paraspinal Tenderness: C-5-C-6  Cardiovascular: Normal rate and regular rhythm.   Pulmonary/Chest: Effort normal and breath sounds normal.  Musculoskeletal:  Normal Muscle Bulk and Muscle Testing Reveals: Upper Extremities: Full ROM and Muscle Strength 5/5 Bilateral AC Joint Tenderness Thoracic Paraspinal Tenderness: T-5-T-7 Lower Extremities: Full ROM and Muscle Strength 5/5 Arises from chair with ease Narrow Based Gait  Neurological: She is alert and oriented to person, place, and time.  Skin: Skin is warm and dry.  Psychiatric: She has a normal mood and affect.  Nursing note and vitals reviewed.         Assessment & Plan:  1. Lumbar degenerative disc disease: 10/09/2016 Refilled: HYDROcodone 10/325 mg two tablets every 8 hours as needed #180. We will continue the opioid monitoring program, this consists of regular clinic visits, examinations, urine drug screen, pill counts as well as use of [place] Controlled Substance Reporting System.  2. Thoracic Spondylosis: Continue current medication regime. 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 10/09/2016 4. Fibromyalgia: Continue with Heat and exercise therapy. 10/09/2016 5.Depression and anxiety: Psychiatry Following. 10/09/2016 6. Orthostatic hypotension: Cardiology Following. 10/09/2016 7. Left Wrist Pain: Continue to Monitor  20  minutes of face to face patient care time was spent during this visit. All questions were encouraged and  answered.  F/U in 1 month

## 2016-10-09 NOTE — Telephone Encounter (Signed)
On 10/09/2016 the [facility] was reviewed no conflict was seen on the [place] with multiple prescribers. [name] has a signed narcotic contract with our office. If there were any discrepancies this would have been reported to her physician.

## 2016-11-03 ENCOUNTER — Encounter: Payer: Self-pay | Admitting: Physical Medicine & Rehabilitation

## 2016-11-03 ENCOUNTER — Ambulatory Visit (HOSPITAL_BASED_OUTPATIENT_CLINIC_OR_DEPARTMENT_OTHER): Payer: Medicare Other | Admitting: Physical Medicine & Rehabilitation

## 2016-11-03 ENCOUNTER — Encounter: Payer: Medicare Other | Attending: Physical Medicine & Rehabilitation

## 2016-11-03 VITALS — BP 111/82 | HR 97 | Resp 14

## 2016-11-03 DIAGNOSIS — R000 Tachycardia, unspecified: Secondary | ICD-10-CM | POA: Insufficient documentation

## 2016-11-03 DIAGNOSIS — Z79899 Other long term (current) drug therapy: Secondary | ICD-10-CM

## 2016-11-03 DIAGNOSIS — Z87891 Personal history of nicotine dependence: Secondary | ICD-10-CM | POA: Diagnosis not present

## 2016-11-03 DIAGNOSIS — K449 Diaphragmatic hernia without obstruction or gangrene: Secondary | ICD-10-CM | POA: Insufficient documentation

## 2016-11-03 DIAGNOSIS — Z86010 Personal history of colonic polyps: Secondary | ICD-10-CM | POA: Insufficient documentation

## 2016-11-03 DIAGNOSIS — R002 Palpitations: Secondary | ICD-10-CM | POA: Diagnosis not present

## 2016-11-03 DIAGNOSIS — K222 Esophageal obstruction: Secondary | ICD-10-CM | POA: Insufficient documentation

## 2016-11-03 DIAGNOSIS — M4716 Other spondylosis with myelopathy, lumbar region: Secondary | ICD-10-CM

## 2016-11-03 DIAGNOSIS — Z599 Problem related to housing and economic circumstances, unspecified: Secondary | ICD-10-CM | POA: Diagnosis not present

## 2016-11-03 DIAGNOSIS — M47814 Spondylosis without myelopathy or radiculopathy, thoracic region: Secondary | ICD-10-CM

## 2016-11-03 DIAGNOSIS — K589 Irritable bowel syndrome without diarrhea: Secondary | ICD-10-CM | POA: Diagnosis not present

## 2016-11-03 DIAGNOSIS — Z760 Encounter for issue of repeat prescription: Secondary | ICD-10-CM | POA: Insufficient documentation

## 2016-11-03 DIAGNOSIS — M797 Fibromyalgia: Secondary | ICD-10-CM

## 2016-11-03 DIAGNOSIS — M5136 Other intervertebral disc degeneration, lumbar region: Secondary | ICD-10-CM | POA: Diagnosis not present

## 2016-11-03 DIAGNOSIS — M542 Cervicalgia: Secondary | ICD-10-CM | POA: Diagnosis not present

## 2016-11-03 DIAGNOSIS — M47892 Other spondylosis, cervical region: Secondary | ICD-10-CM | POA: Diagnosis not present

## 2016-11-03 DIAGNOSIS — K648 Other hemorrhoids: Secondary | ICD-10-CM | POA: Diagnosis not present

## 2016-11-03 DIAGNOSIS — R55 Syncope and collapse: Secondary | ICD-10-CM | POA: Insufficient documentation

## 2016-11-03 DIAGNOSIS — G894 Chronic pain syndrome: Secondary | ICD-10-CM | POA: Diagnosis not present

## 2016-11-03 DIAGNOSIS — K0889 Other specified disorders of teeth and supporting structures: Secondary | ICD-10-CM | POA: Diagnosis not present

## 2016-11-03 DIAGNOSIS — M545 Low back pain: Secondary | ICD-10-CM | POA: Diagnosis not present

## 2016-11-03 DIAGNOSIS — G8929 Other chronic pain: Secondary | ICD-10-CM | POA: Diagnosis not present

## 2016-11-03 DIAGNOSIS — F419 Anxiety disorder, unspecified: Secondary | ICD-10-CM | POA: Insufficient documentation

## 2016-11-03 DIAGNOSIS — I951 Orthostatic hypotension: Secondary | ICD-10-CM | POA: Diagnosis not present

## 2016-11-03 DIAGNOSIS — K219 Gastro-esophageal reflux disease without esophagitis: Secondary | ICD-10-CM | POA: Insufficient documentation

## 2016-11-03 DIAGNOSIS — R51 Headache: Secondary | ICD-10-CM | POA: Diagnosis not present

## 2016-11-03 DIAGNOSIS — Z5181 Encounter for therapeutic drug level monitoring: Secondary | ICD-10-CM

## 2016-11-03 DIAGNOSIS — F329 Major depressive disorder, single episode, unspecified: Secondary | ICD-10-CM | POA: Insufficient documentation

## 2016-11-03 MED ORDER — HYDROCODONE-ACETAMINOPHEN 7.5-325 MG PO TABS: 2.0000 | ORAL_TABLET | Freq: Three times a day (TID) | ORAL | 0 refills | Status: DC

## 2016-11-03 MED ORDER — PREGABALIN 75 MG PO CAPS: 75.0000 mg | ORAL_CAPSULE | Freq: Two times a day (BID) | ORAL | 0 refills | Status: DC

## 2016-11-03 NOTE — Patient Instructions (Signed)
Myofascial Pain Syndrome and Fibromyalgia Myofascial pain syndrome and fibromyalgia are both pain disorders. This pain may be felt mainly in your muscles.  Myofascial pain syndrome:  Always has trigger points or tender points in the muscle that will cause pain when pressed. The pain may come and go.  Usually affects your neck, upper back, and shoulder areas. The pain often radiates into your arms and hands.  Fibromyalgia:  Has muscle pains and tenderness that come and go.  Is often associated with fatigue and sleep disturbances.  Has trigger points.  Tends to be long-lasting (chronic), but is not life-threatening. Fibromyalgia and myofascial pain are not the same. However, they often occur together. If you have both conditions, each can make the other worse. Both are common and can cause enough pain and fatigue to make day-to-day activities difficult. What are the causes? The exact causes of fibromyalgia and myofascial pain are not known. People with certain gene types may be more likely to develop fibromyalgia. Some factors can be triggers for both conditions, such as:  Spine disorders.  Arthritis.  Severe injury (trauma) and other physical stressors.  Being under a lot of stress.  A medical illness. What are the signs or symptoms? Fibromyalgia  The main symptom of fibromyalgia is widespread pain and tenderness in your muscles. This can vary over time. Pain is sometimes described as stabbing, shooting, or burning. You may have tingling or numbness, too. You may also have sleep problems and fatigue. You may wake up feeling tired and groggy (fibro fog). Other symptoms may include:  Bowel and bladder problems.  Headaches.  Visual problems.  Problems with odors and noises.  Depression or mood changes.  Painful menstrual periods (dysmenorrhea).  Dry skin or eyes. Myofascial pain syndrome  Symptoms of myofascial pain syndrome include:  Tight, ropy bands of  muscle.  Uncomfortable sensations in muscular areas, such as:  Aching.  Cramping.  Burning.  Numbness.  Tingling.  Muscle weakness.  Trouble moving certain muscles freely (range of motion). How is this diagnosed? There are no specific tests to diagnose fibromyalgia or myofascial pain syndrome. Both can be hard to diagnose because their symptoms are common in many other conditions. Your health care provider may suspect one or both of these conditions based on your symptoms and medical history. Your health care provider will also do a physical exam. The key to diagnosing fibromyalgia is having pain, fatigue, and other symptoms for more than three months that cannot be explained by another condition. The key to diagnosing myofascial pain syndrome is finding trigger points in muscles that are tender and cause pain elsewhere in your body (referred pain). How is this treated? Treating fibromyalgia and myofascial pain often requires a team of health care providers. This usually starts with your primary provider and a physical therapist. You may also find it helpful to work with alternative health care providers, such as massage therapists or acupuncturists. Treatment for fibromyalgia may include medicines. This may include nonsteroidal anti-inflammatory drugs (NSAIDs), along with other medicines. Treatment for myofascial pain may also include:  NSAIDs.  Cooling and stretching of muscles.  Trigger point injections.  Sound wave (ultrasound) treatments to stimulate muscles. Follow these instructions at home:  Take medicines only as directed by your health care provider.  Exercise as directed by your health care provider or physical therapist.  Try to avoid stressful situations.  Practice relaxation techniques to control your stress. You may want to try:  Biofeedback.  Visual imagery.  Hypnosis.  Muscle relaxation.  Yoga.  Meditation.  Talk to your health care provider  about alternative treatments, such as acupuncture or massage treatment.  Maintain a healthy lifestyle. This includes eating a healthy diet and getting enough sleep.  Consider joining a support group.  Do not do activities that stress or strain your muscles. That includes repetitive motions and heavy lifting. Where to find more information:  National Fibromyalgia Association: www.fmaware.[place]: www.arthritis.org  American Chronic Pain Association: [employer].dk Contact a health care provider if:  You have new symptoms.  Your symptoms get worse.  You have side effects from your medicines.  You have trouble sleeping.  Your condition is causing depression or anxiety. This information is not intended to replace advice given to you by your health care provider. Make sure you discuss any questions you have with your health care provider. Document Released: 07/20/2005 Document Revised: 12/26/2015 Document Reviewed: 04/25/2014 Elsevier Interactive Patient Education  2017 [employer].

## 2016-11-03 NOTE — Progress Notes (Signed)
Subjective:    Patient ID: [name], female    DOB: May 29, 1976, 41 y.o.   MRN: [id number]  HPI  Taking care of mother who has been hospitalized at [facility] While the patient still has some back pain which seems "deep", her primary complaint today is hand pain and foot pain. She notes some numbness but mainly an achy burning sensation  No issues with her pots syndrome  Pain Inventory Average Pain 8 Pain Right Now 6 My pain is intermittent, sharp, burning, dull, stabbing, tingling and aching  In the last 24 hours, has pain interfered with the following? General activity 7 Relation with others 7 Enjoyment of life 7 What TIME of day is your pain at its worst? morning, evening, night Sleep (in general) Fair  Pain is worse with: walking, bending, sitting, standing, unsure and some activites Pain improves with: rest, heat/ice, medication and TENS Relief from Meds: 7  Mobility walk without assistance how many minutes can you walk? 20-30 ability to climb steps?  yes do you drive?  yes  Function disabled: date disabled .  Neuro/Psych weakness numbness tremor tingling trouble walking spasms dizziness depression anxiety  Prior Studies Any changes since last visit?  no  Physicians involved in your care Any changes since last visit?  no   Family History  Problem Relation Age of Onset  . Diabetes type II Mother   . Colon polyps Mother   . Diverticulitis Mother   . Diabetes Mother   . Heart disease Mother   . Diabetes type II Brother   . Uterine cancer    . Ovarian cancer     Social History   Social History  . Marital status: Married    Spouse name: N/A  . Number of children: 2  . Years of education: N/A   Occupational History  . Disability Unemployed   Social History Main Topics  . Smoking status: Former Smoker    Types: Cigarettes    Quit date: 06/30/2011  . Smokeless tobacco: Never Used  . Alcohol use No  . Drug use: No  . Sexual  activity: Yes   Other Topics Concern  . None   Social History Narrative  . None   Past Surgical History:  Procedure Laterality Date  . CESAREAN SECTION    . Left elbow surgery    . PARTIAL HYSTERECTOMY    . TONSILLECTOMY     Past Medical History:  Diagnosis Date  . Anxiety   . Chronic pain    Dr. Letta Pate  . Degenerative disc disease    Spinal, small syrinx  . Depression   . Esophageal stricture    Status post dilatation  . Fibromyalgia   . GERD (gastroesophageal reflux disease)   . Hiatal hernia   . Internal hemorrhoids without mention of complication   . Irritable bowel syndrome   . Palpitations    No documented arrhythmias  . Personal history of colonic polyps 10/04/2000   HYPERPLASTIC POLYP  . POTS (postural orthostatic tachycardia syndrome)   . Syncope    Negative tilt table test  . Syringomyelia and syringobulbia (HCC)    BP 111/82 (BP Location: Right Arm, Patient Position: Sitting, Cuff Size: Normal)   Pulse 97   Resp 14   SpO2 98%   Opioid Risk Score:   Fall Risk Score:  `1  Depression screen PHQ 2/9  Depression screen [facility] 2/9 05/26/2016 02/24/2016 04/23/2015 03/26/2015 02/22/2015 10/22/2014  Decreased Interest 1 1 1 1  1  2  Down, Depressed, Hopeless 1 1 1 1 1 3   PHQ - 2 Score 2 2 2 2 2 5   Altered sleeping - - - - - 2  Tired, decreased energy - - - - - 3  Change in appetite - - - - - 0  Feeling bad or failure about yourself  - - - - - 2  Trouble concentrating - - - - - 1  Moving slowly or fidgety/restless - - - - - 2  Suicidal thoughts - - - - - 0  PHQ-9 Score - - - - - 15    Review of Systems  HENT: Negative.   Eyes: Negative.   Respiratory: Negative.   Cardiovascular: Negative.   Gastrointestinal: Negative.   Endocrine: Negative.   Genitourinary: Negative.   Musculoskeletal: Positive for arthralgias, back pain, gait problem, myalgias, neck pain and neck stiffness.       Spasms   Skin: Negative.   Allergic/Immunologic: Negative.     Neurological: Positive for dizziness, tremors, weakness and numbness.       Tingling  Hematological: Negative.   Psychiatric/Behavioral: Positive for dysphoric mood. The patient is nervous/anxious.   All other systems reviewed and are negative.      Objective:   Physical Exam  Constitutional: She is oriented to person, place, and time. She appears well-developed and well-nourished.  HENT:  Head: Normocephalic and atraumatic.  Eyes: Conjunctivae and EOM are normal. Pupils are equal, round, and reactive to light.  Musculoskeletal:       Right elbow: Normal.      Left elbow: Normal.       Cervical back: She exhibits tenderness. She exhibits normal range of motion.       Thoracic back: She exhibits normal range of motion, no tenderness, no deformity and no spasm.       Lumbar back: She exhibits normal range of motion, no tenderness, no deformity and no spasm.       Right hand: Normal. Decreased sensation is not present in the ulnar distribution, is not present in the medial distribution and is not present in the radial distribution.       Left hand: Normal. Normal sensation noted. Decreased sensation is not present in the ulnar distribution, is not present in the medial redistribution and is not present in the radial distribution.  Neurological: She is alert and oriented to person, place, and time. She exhibits normal muscle tone. Coordination normal.  Strength 5/5 bilateral deltoids, biceps, triceps, grip, hip flexor, knee extensor, ankle dorsiflexor, light touch bilateral upper limbs plantar flexor. Sensation intact to light touch and pinprick bilateral upper    Psychiatric: Her speech is normal and behavior is normal. Judgment and thought content normal. Her mood appears anxious. Cognition and memory are normal.  Nursing note and vitals reviewed.         Assessment & Plan:  1. History of lumbar and thoracic spondylosis. Her low back and mid back pain is well controlled. She has  good range of motion, no neurologic deficits to suggest any myelopathy or radiculopathy. We will reduce Norco 7.5 2 by mouth 3 times a day  2. Fibromyalgia syndrome. Not taking any medication for this. We'll start Lyrica 75 mg twice a day. May need to titrate upward   Next step would be to reduce Norco to 5 mg 2 tablets 3 times a day and increasing Lyrica 75 3 times a day

## 2016-11-08 LAB — TOXASSURE SELECT,+ANTIDEPR,UR

## 2016-11-16 ENCOUNTER — Telehealth: Payer: Self-pay | Admitting: *Deleted

## 2016-11-16 NOTE — Telephone Encounter (Signed)
Urine drug screen for this encounter is consistent for prescribed medication 

## 2016-12-03 ENCOUNTER — Other Ambulatory Visit (HOSPITAL_COMMUNITY): Payer: Self-pay | Admitting: Internal Medicine

## 2016-12-03 ENCOUNTER — Encounter: Payer: Medicare Other | Admitting: Registered Nurse

## 2016-12-03 DIAGNOSIS — Z1231 Encounter for screening mammogram for malignant neoplasm of breast: Secondary | ICD-10-CM

## 2016-12-03 DIAGNOSIS — N6452 Nipple discharge: Secondary | ICD-10-CM

## 2016-12-04 ENCOUNTER — Encounter: Payer: Self-pay | Admitting: Registered Nurse

## 2016-12-04 ENCOUNTER — Encounter: Payer: Medicare Other | Attending: Physical Medicine & Rehabilitation | Admitting: Registered Nurse

## 2016-12-04 VITALS — BP 117/77 | HR 89

## 2016-12-04 DIAGNOSIS — M47892 Other spondylosis, cervical region: Secondary | ICD-10-CM | POA: Diagnosis not present

## 2016-12-04 DIAGNOSIS — R51 Headache: Secondary | ICD-10-CM | POA: Diagnosis not present

## 2016-12-04 DIAGNOSIS — K222 Esophageal obstruction: Secondary | ICD-10-CM | POA: Insufficient documentation

## 2016-12-04 DIAGNOSIS — M545 Low back pain: Secondary | ICD-10-CM | POA: Diagnosis not present

## 2016-12-04 DIAGNOSIS — R55 Syncope and collapse: Secondary | ICD-10-CM | POA: Insufficient documentation

## 2016-12-04 DIAGNOSIS — K219 Gastro-esophageal reflux disease without esophagitis: Secondary | ICD-10-CM | POA: Insufficient documentation

## 2016-12-04 DIAGNOSIS — F419 Anxiety disorder, unspecified: Secondary | ICD-10-CM | POA: Diagnosis not present

## 2016-12-04 DIAGNOSIS — Z760 Encounter for issue of repeat prescription: Secondary | ICD-10-CM | POA: Diagnosis not present

## 2016-12-04 DIAGNOSIS — K648 Other hemorrhoids: Secondary | ICD-10-CM | POA: Diagnosis not present

## 2016-12-04 DIAGNOSIS — F329 Major depressive disorder, single episode, unspecified: Secondary | ICD-10-CM | POA: Insufficient documentation

## 2016-12-04 DIAGNOSIS — I951 Orthostatic hypotension: Secondary | ICD-10-CM | POA: Diagnosis not present

## 2016-12-04 DIAGNOSIS — Z86010 Personal history of colonic polyps: Secondary | ICD-10-CM | POA: Diagnosis not present

## 2016-12-04 DIAGNOSIS — M5136 Other intervertebral disc degeneration, lumbar region: Secondary | ICD-10-CM | POA: Diagnosis not present

## 2016-12-04 DIAGNOSIS — M4716 Other spondylosis with myelopathy, lumbar region: Secondary | ICD-10-CM

## 2016-12-04 DIAGNOSIS — M797 Fibromyalgia: Secondary | ICD-10-CM | POA: Diagnosis not present

## 2016-12-04 DIAGNOSIS — R002 Palpitations: Secondary | ICD-10-CM | POA: Diagnosis not present

## 2016-12-04 DIAGNOSIS — M542 Cervicalgia: Secondary | ICD-10-CM | POA: Diagnosis not present

## 2016-12-04 DIAGNOSIS — Z599 Problem related to housing and economic circumstances, unspecified: Secondary | ICD-10-CM | POA: Diagnosis not present

## 2016-12-04 DIAGNOSIS — K0889 Other specified disorders of teeth and supporting structures: Secondary | ICD-10-CM | POA: Insufficient documentation

## 2016-12-04 DIAGNOSIS — G894 Chronic pain syndrome: Secondary | ICD-10-CM | POA: Diagnosis not present

## 2016-12-04 DIAGNOSIS — Z79899 Other long term (current) drug therapy: Secondary | ICD-10-CM

## 2016-12-04 DIAGNOSIS — Z5181 Encounter for therapeutic drug level monitoring: Secondary | ICD-10-CM

## 2016-12-04 DIAGNOSIS — Z87891 Personal history of nicotine dependence: Secondary | ICD-10-CM | POA: Diagnosis not present

## 2016-12-04 DIAGNOSIS — K449 Diaphragmatic hernia without obstruction or gangrene: Secondary | ICD-10-CM | POA: Diagnosis not present

## 2016-12-04 DIAGNOSIS — R000 Tachycardia, unspecified: Secondary | ICD-10-CM | POA: Diagnosis not present

## 2016-12-04 DIAGNOSIS — K589 Irritable bowel syndrome without diarrhea: Secondary | ICD-10-CM | POA: Insufficient documentation

## 2016-12-04 DIAGNOSIS — G8929 Other chronic pain: Secondary | ICD-10-CM | POA: Diagnosis not present

## 2016-12-04 MED ORDER — HYDROCODONE-ACETAMINOPHEN 7.5-325 MG PO TABS: 2.0000 | ORAL_TABLET | Freq: Three times a day (TID) | ORAL | 0 refills | Status: DC

## 2016-12-04 NOTE — Progress Notes (Signed)
Subjective:    Patient ID: [name], female    DOB: [birth year], 41 y.o.   MRN: [id number]  HPI: [name] is a 41year old female who returns for follow up appointmentfor chronic pain and medication refill. She states her pain is located in lower back ( states it is deep pain). She rates her pain 7.Her current exercise regime is walking and performing stretching exercises occassionally. Encouraged to increase her activity as tolerated, she verbalizes understanding.  Last month Dr. Letta Pate decreased [name] and initiated Lyrica. [name] states her pain has not been controlled with the above regimen, instructed we will continue the current regimen this month. We will follow Dr. Letta Pate recommendation next month, she verbalizes understanding. Instructed to call office with any questions and concerns, she verbalizes understanding.   Last UDS was performed on  11/03/2016, it was consistent.    Pain Inventory Average Pain 7 Pain Right Now 7 My pain is intermittent, sharp, burning, dull, stabbing, tingling and aching  In the last 24 hours, has pain interfered with the following? General activity 7 Relation with others 7 Enjoyment of life 7 What TIME of day is your pain at its worst? all Sleep (in general) Fair  Pain is worse with: walking, bending, sitting, inactivity, standing, unsure and some activites Pain improves with: rest, heat/ice, medication and TENS Relief from Meds: 7  Mobility walk without assistance ability to climb steps?  yes do you drive?  yes  Function disabled: date disabled .  Neuro/Psych weakness numbness tingling spasms confusion depression anxiety  Prior Studies Any changes since last visit?  no  Physicians involved in your care Any changes since last visit?  no   Family History  Problem Relation Age of Onset  . Diabetes type II Mother   . Colon polyps Mother   . Diverticulitis Mother   . Diabetes  Mother   . Heart disease Mother   . Diabetes type II Brother   . Uterine cancer    . Ovarian cancer     Social History   Social History  . Marital status: Married    Spouse name: N/A  . Number of children: 2  . Years of education: N/A   Occupational History  . Disability Unemployed   Social History Main Topics  . Smoking status: Former Smoker    Types: Cigarettes    Quit date: 06/30/2011  . Smokeless tobacco: Never Used  . Alcohol use No  . Drug use: No  . Sexual activity: Yes   Other Topics Concern  . None   Social History Narrative  . None   Past Surgical History:  Procedure Laterality Date  . CESAREAN SECTION    . Left elbow surgery    . PARTIAL HYSTERECTOMY    . TONSILLECTOMY     Past Medical History:  Diagnosis Date  . Anxiety   . Chronic pain    Dr. Letta Pate  . Degenerative disc disease    Spinal, small syrinx  . Depression   . Esophageal stricture    Status post dilatation  . Fibromyalgia   . GERD (gastroesophageal reflux disease)   . Hiatal hernia   . Internal hemorrhoids without mention of complication   . Irritable bowel syndrome   . Palpitations    No documented arrhythmias  . Personal history of colonic polyps 10/04/2000   HYPERPLASTIC POLYP  . POTS (postural orthostatic tachycardia syndrome)   . Syncope    Negative tilt table  test  . Syringomyelia and syringobulbia (HCC)    BP 117/77   Pulse 89   SpO2 99%   Opioid Risk Score:   Fall Risk Score:  `1  Depression screen PHQ 2/9  Depression screen [facility] 2/9 05/26/2016 02/24/2016 04/23/2015 03/26/2015 02/22/2015 10/22/2014  Decreased Interest 1 1 1 1 1 2   Down, Depressed, Hopeless 1 1 1 1 1 3   PHQ - 2 Score 2 2 2 2 2 5   Altered sleeping - - - - - 2  Tired, decreased energy - - - - - 3  Change in appetite - - - - - 0  Feeling bad or failure about yourself  - - - - - 2  Trouble concentrating - - - - - 1  Moving slowly or fidgety/restless - - - - - 2  Suicidal thoughts - - - - - 0  PHQ-9  Score - - - - - 15  \ Review of Systems  Constitutional: Positive for appetite change and fever.  HENT: Negative.   Eyes: Negative.   Respiratory: Positive for shortness of breath.   Cardiovascular: Negative.   Gastrointestinal: Positive for constipation.  Endocrine: Negative.   Genitourinary: Negative.   Musculoskeletal: Negative.   Skin: Negative.   Allergic/Immunologic: Negative.   Neurological: Negative.   Hematological: Negative.   Psychiatric/Behavioral: Negative.   All other systems reviewed and are negative.      Objective:   Physical Exam  Constitutional: She is oriented to person, place, and time. She appears well-developed and well-nourished.  HENT:  Head: Normocephalic and atraumatic.  Neck: Normal range of motion. Neck supple.  Cardiovascular: Normal rate and regular rhythm.   Pulmonary/Chest: Effort normal and breath sounds normal.  Musculoskeletal:  Normal Muscle Bulk and Muscle Testing Reveals: Upper Extremities: Full ROM and Muscle Strength 5/5 Back without spinal tenderness noted Lower Extremities: Full ROM and Muscle Strength 5/5 Arises from Table with ease Narrow Based Gait  Neurological: She is alert and oriented to person, place, and time.  Skin: Skin is warm and dry.  Psychiatric: She has a normal mood and affect.  Nursing note and vitals reviewed.         Assessment & Plan:  1. Lumbar degenerative disc disease: 12/04/2016 Refilled: Shea 7.5/325 mg two tablets every 8 hours as needed #180. We will continue the opioid monitoring program, this consists of regular clinic visits, examinations, urine drug screen, pill counts as well as use of [place] Controlled Substance Reporting System.  2. Thoracic Spondylosis: Continue current medication regime. 12/04/2016 3Cervical spondylosis without evidence of myelopathy: Continue with exercise and heat therapy. 12/04/2016 4. Fibromyalgia: Continue Lyrica. Continue with Heat and exercise  therapy. 12/04/2016 5.Depression and anxiety: Psychiatry Following. 12/04/2016 6.Orthostatic hypotension: Cardiology Following. 12/04/2016  20 minutes of face to face patient care time was spent during this visit. All questions were encouraged and answered.  F/U in 1 month

## 2016-12-07 ENCOUNTER — Other Ambulatory Visit (HOSPITAL_COMMUNITY): Payer: Self-pay | Admitting: Internal Medicine

## 2016-12-07 DIAGNOSIS — N6452 Nipple discharge: Secondary | ICD-10-CM

## 2016-12-16 ENCOUNTER — Encounter: Payer: Self-pay | Admitting: Gynecology

## 2016-12-22 ENCOUNTER — Other Ambulatory Visit (HOSPITAL_COMMUNITY): Payer: Self-pay | Admitting: Internal Medicine

## 2016-12-22 ENCOUNTER — Ambulatory Visit (HOSPITAL_COMMUNITY)
Admission: RE | Admit: 2016-12-22 | Discharge: 2016-12-22 | Disposition: A | Discharge status: Home / Self Care | Payer: Medicare Other | Source: Ambulatory Visit | Attending: Internal Medicine | Admitting: Internal Medicine

## 2016-12-22 DIAGNOSIS — N6310 Unspecified lump in the right breast, unspecified quadrant: Secondary | ICD-10-CM | POA: Diagnosis not present

## 2016-12-22 DIAGNOSIS — N6311 Unspecified lump in the right breast, upper outer quadrant: Secondary | ICD-10-CM | POA: Diagnosis not present

## 2016-12-22 DIAGNOSIS — N6312 Unspecified lump in the right breast, upper inner quadrant: Secondary | ICD-10-CM | POA: Diagnosis not present

## 2016-12-22 DIAGNOSIS — N611 Abscess of the breast and nipple: Secondary | ICD-10-CM | POA: Diagnosis not present

## 2016-12-22 DIAGNOSIS — N6452 Nipple discharge: Secondary | ICD-10-CM | POA: Insufficient documentation

## 2016-12-22 DIAGNOSIS — R928 Other abnormal and inconclusive findings on diagnostic imaging of breast: Secondary | ICD-10-CM | POA: Diagnosis not present

## 2016-12-22 DIAGNOSIS — N6313 Unspecified lump in the right breast, lower outer quadrant: Secondary | ICD-10-CM | POA: Diagnosis not present

## 2016-12-23 ENCOUNTER — Other Ambulatory Visit (HOSPITAL_COMMUNITY): Payer: Self-pay | Admitting: Internal Medicine

## 2016-12-23 DIAGNOSIS — N6311 Unspecified lump in the right breast, upper outer quadrant: Secondary | ICD-10-CM

## 2016-12-29 ENCOUNTER — Other Ambulatory Visit (HOSPITAL_COMMUNITY): Payer: Self-pay | Admitting: Internal Medicine

## 2016-12-29 ENCOUNTER — Ambulatory Visit (HOSPITAL_COMMUNITY)
Admission: RE | Admit: 2016-12-29 | Discharge: 2016-12-29 | Disposition: A | Discharge status: Home / Self Care | Payer: Medicare Other | Source: Ambulatory Visit | Attending: Internal Medicine | Admitting: Internal Medicine

## 2016-12-29 ENCOUNTER — Other Ambulatory Visit: Payer: Self-pay | Admitting: Diagnostic Radiology

## 2016-12-29 ENCOUNTER — Other Ambulatory Visit (HOSPITAL_COMMUNITY)
Admission: RE | Admit: 2016-12-29 | Discharge: 2016-12-29 | Disposition: A | Discharge status: Home / Self Care | Payer: Medicare Other | Source: Ambulatory Visit | Attending: Diagnostic Radiology | Admitting: Diagnostic Radiology

## 2016-12-29 ENCOUNTER — Encounter (HOSPITAL_COMMUNITY): Payer: Self-pay

## 2016-12-29 DIAGNOSIS — N6311 Unspecified lump in the right breast, upper outer quadrant: Secondary | ICD-10-CM | POA: Insufficient documentation

## 2016-12-29 DIAGNOSIS — R928 Other abnormal and inconclusive findings on diagnostic imaging of breast: Secondary | ICD-10-CM

## 2016-12-29 DIAGNOSIS — N63 Unspecified lump in unspecified breast: Secondary | ICD-10-CM

## 2016-12-29 DIAGNOSIS — N6011 Diffuse cystic mastopathy of right breast: Secondary | ICD-10-CM | POA: Diagnosis not present

## 2016-12-29 DIAGNOSIS — N6312 Unspecified lump in the right breast, upper inner quadrant: Secondary | ICD-10-CM | POA: Diagnosis not present

## 2016-12-29 MED ORDER — LIDOCAINE-EPINEPHRINE (PF) 1 %-1:200000 IJ SOLN
INTRAMUSCULAR | Status: AC
  Filled 2016-12-29: qty 30

## 2016-12-29 MED ORDER — LIDOCAINE HCL (PF) 1 % IJ SOLN
INTRAMUSCULAR | Status: AC
  Administered 2016-12-29: 10 mL
  Filled 2016-12-29: qty 10

## 2016-12-31 HISTORY — PX: BREAST BIOPSY: SHX20

## 2017-01-04 ENCOUNTER — Encounter: Payer: Self-pay | Admitting: Registered Nurse

## 2017-01-04 ENCOUNTER — Encounter: Payer: Medicare Other | Attending: Physical Medicine & Rehabilitation | Admitting: Registered Nurse

## 2017-01-04 VITALS — BP 117/83 | HR 94

## 2017-01-04 DIAGNOSIS — K222 Esophageal obstruction: Secondary | ICD-10-CM | POA: Insufficient documentation

## 2017-01-04 DIAGNOSIS — Z87891 Personal history of nicotine dependence: Secondary | ICD-10-CM | POA: Diagnosis not present

## 2017-01-04 DIAGNOSIS — R55 Syncope and collapse: Secondary | ICD-10-CM | POA: Insufficient documentation

## 2017-01-04 DIAGNOSIS — M797 Fibromyalgia: Secondary | ICD-10-CM | POA: Diagnosis not present

## 2017-01-04 DIAGNOSIS — Z79899 Other long term (current) drug therapy: Secondary | ICD-10-CM | POA: Diagnosis not present

## 2017-01-04 DIAGNOSIS — Z86010 Personal history of colonic polyps: Secondary | ICD-10-CM | POA: Diagnosis not present

## 2017-01-04 DIAGNOSIS — K648 Other hemorrhoids: Secondary | ICD-10-CM | POA: Insufficient documentation

## 2017-01-04 DIAGNOSIS — F419 Anxiety disorder, unspecified: Secondary | ICD-10-CM | POA: Diagnosis not present

## 2017-01-04 DIAGNOSIS — K0889 Other specified disorders of teeth and supporting structures: Secondary | ICD-10-CM | POA: Insufficient documentation

## 2017-01-04 DIAGNOSIS — I951 Orthostatic hypotension: Secondary | ICD-10-CM | POA: Insufficient documentation

## 2017-01-04 DIAGNOSIS — Z760 Encounter for issue of repeat prescription: Secondary | ICD-10-CM | POA: Insufficient documentation

## 2017-01-04 DIAGNOSIS — F329 Major depressive disorder, single episode, unspecified: Secondary | ICD-10-CM | POA: Insufficient documentation

## 2017-01-04 DIAGNOSIS — K589 Irritable bowel syndrome without diarrhea: Secondary | ICD-10-CM | POA: Diagnosis not present

## 2017-01-04 DIAGNOSIS — M545 Low back pain: Secondary | ICD-10-CM | POA: Diagnosis not present

## 2017-01-04 DIAGNOSIS — M542 Cervicalgia: Secondary | ICD-10-CM | POA: Insufficient documentation

## 2017-01-04 DIAGNOSIS — Z5181 Encounter for therapeutic drug level monitoring: Secondary | ICD-10-CM

## 2017-01-04 DIAGNOSIS — M47892 Other spondylosis, cervical region: Secondary | ICD-10-CM | POA: Insufficient documentation

## 2017-01-04 DIAGNOSIS — R51 Headache: Secondary | ICD-10-CM | POA: Diagnosis not present

## 2017-01-04 DIAGNOSIS — G8929 Other chronic pain: Secondary | ICD-10-CM | POA: Insufficient documentation

## 2017-01-04 DIAGNOSIS — R000 Tachycardia, unspecified: Secondary | ICD-10-CM | POA: Insufficient documentation

## 2017-01-04 DIAGNOSIS — K449 Diaphragmatic hernia without obstruction or gangrene: Secondary | ICD-10-CM | POA: Diagnosis not present

## 2017-01-04 DIAGNOSIS — M5136 Other intervertebral disc degeneration, lumbar region: Secondary | ICD-10-CM | POA: Diagnosis not present

## 2017-01-04 DIAGNOSIS — R002 Palpitations: Secondary | ICD-10-CM | POA: Diagnosis not present

## 2017-01-04 DIAGNOSIS — Z599 Problem related to housing and economic circumstances, unspecified: Secondary | ICD-10-CM | POA: Insufficient documentation

## 2017-01-04 DIAGNOSIS — K219 Gastro-esophageal reflux disease without esophagitis: Secondary | ICD-10-CM | POA: Diagnosis not present

## 2017-01-04 DIAGNOSIS — M4716 Other spondylosis with myelopathy, lumbar region: Secondary | ICD-10-CM | POA: Diagnosis not present

## 2017-01-04 DIAGNOSIS — G894 Chronic pain syndrome: Secondary | ICD-10-CM | POA: Diagnosis not present

## 2017-01-04 MED ORDER — HYDROCODONE-ACETAMINOPHEN 7.5-325 MG PO TABS: 2.0000 | ORAL_TABLET | Freq: Three times a day (TID) | ORAL | 0 refills | Status: DC

## 2017-01-04 MED ORDER — PREGABALIN 50 MG PO CAPS: 50.0000 mg | ORAL_CAPSULE | Freq: Two times a day (BID) | ORAL | 2 refills | Status: DC

## 2017-01-04 NOTE — Progress Notes (Signed)
Subjective:    Patient ID: [name], female    DOB: 09-18-1975, 41 y.o.   MRN: [id number]  HPI:  [name] is a 41year old female who returns for follow up appointmentfor chronic pain and medication refill. She states her pain is located in lower back ( states it is deep pain). Also states her pain intensifies with long periods of sitting and standing. Instructed to change positions frequently and perform activities in short intervals, she verbalizes understanding. She rates her pain 6.Her current exercise regime is walking and performing stretching exercises occassionally.   In April Lyrica was prescribed, [name] hasn't been able to tolerate dosage due to daytime drowsiness. Will decrease Lyrica to 50 mg and Hydrocodone dose will remain the same at this time. She was instructed to call office with any questions and concerns, she verbalizes understanding.   Last UDS was performed on  11/03/2016, it was consistent.   Pain Inventory Average Pain 7 Pain Right Now 6 My pain is intermittent, constant, sharp, burning, dull, stabbing, tingling and aching  In the last 24 hours, has pain interfered with the following? General activity 7 Relation with others 7 Enjoyment of life 7 What TIME of day is your pain at its worst? morning Sleep (in general) Fair  Pain is worse with: walking, bending, sitting, inactivity, standing and some activites Pain improves with: rest, heat/ice, medication and TENS Relief from Meds: 6  Mobility walk without assistance how many minutes can you walk? 10-25 ability to climb steps?  yes do you drive?  yes  Function disabled: date disabled . I need assistance with the following:  meal prep, household duties and shopping  Neuro/Psych weakness numbness tingling spasms dizziness confusion depression anxiety  Prior Studies Any changes since last visit?  no  Physicians involved in your care Any changes since last visit?   no   Family History  Problem Relation Age of Onset  . Diabetes type II Mother   . Colon polyps Mother   . Diverticulitis Mother   . Diabetes Mother   . Heart disease Mother   . Diabetes type II Brother   . Uterine cancer Unknown   . Ovarian cancer Unknown    Social History   Social History  . Marital status: Married    Spouse name: N/A  . Number of children: 2  . Years of education: N/A   Occupational History  . Disability Unemployed   Social History Main Topics  . Smoking status: Former Smoker    Types: Cigarettes    Quit date: 06/30/2011  . Smokeless tobacco: Never Used  . Alcohol use No  . Drug use: No  . Sexual activity: Yes   Other Topics Concern  . None   Social History Narrative  . None   Past Surgical History:  Procedure Laterality Date  . CESAREAN SECTION    . Left elbow surgery    . PARTIAL HYSTERECTOMY    . TONSILLECTOMY     Past Medical History:  Diagnosis Date  . Anxiety   . Chronic pain    Dr. Letta Pate  . Degenerative disc disease    Spinal, small syrinx  . Depression   . Esophageal stricture    Status post dilatation  . Fibromyalgia   . GERD (gastroesophageal reflux disease)   . Hiatal hernia   . Internal hemorrhoids without mention of complication   . Irritable bowel syndrome   . Palpitations    No documented arrhythmias  .  Personal history of colonic polyps 10/04/2000   HYPERPLASTIC POLYP  . POTS (postural orthostatic tachycardia syndrome)   . Syncope    Negative tilt table test  . Syringomyelia and syringobulbia (HCC)    BP 117/83   Pulse 94   SpO2 98%   Opioid Risk Score:   Fall Risk Score:  `1  Depression screen PHQ 2/9  Depression screen [facility] 2/9 05/26/2016 02/24/2016 04/23/2015 03/26/2015 02/22/2015 10/22/2014  Decreased Interest 1 1 1 1 1 2   Down, Depressed, Hopeless 1 1 1 1 1 3   PHQ - 2 Score 2 2 2 2 2 5   Altered sleeping - - - - - 2  Tired, decreased energy - - - - - 3  Change in appetite - - - - - 0  Feeling bad  or failure about yourself  - - - - - 2  Trouble concentrating - - - - - 1  Moving slowly or fidgety/restless - - - - - 2  Suicidal thoughts - - - - - 0  PHQ-9 Score - - - - - 15    Review of Systems  Constitutional: Positive for chills and fever.  HENT: Negative.   Eyes: Negative.   Respiratory: Negative.   Cardiovascular: Negative.   Gastrointestinal: Positive for nausea.  Endocrine: Negative.   Genitourinary: Negative.   Musculoskeletal:       Spasms  Skin: Positive for rash.  Allergic/Immunologic: Negative.   Neurological: Positive for dizziness.  Hematological: Negative.   Psychiatric/Behavioral: Positive for dysphoric mood. The patient is nervous/anxious.   All other systems reviewed and are negative.      Objective:   Physical Exam  Constitutional: She is oriented to person, place, and time. She appears well-developed and well-nourished.  HENT:  Head: Normocephalic and atraumatic.  Neck: Normal range of motion. Neck supple.  Cardiovascular: Normal rate and regular rhythm.   Pulmonary/Chest: Effort normal and breath sounds normal.  Musculoskeletal:  Normal Muscle Bulk and Muscle Testing Reveals: Upper Extremities: Full ROM and Muscle Strength 5/5 Back without spinal tenderness Lower Extremities: Full ROM and Muscle Strength 5/5 Arises from table with ease Narrow Based Gait  Neurological: She is alert and oriented to person, place, and time.  Skin: Skin is warm and dry.  Psychiatric: She has a normal mood and affect.  Nursing note and vitals reviewed.         Assessment & Plan:  1. Lumbar degenerative disc disease: 01/04/2017 Refilled: HYDROcodone 7.5/325 mg two tablets every 8 hours as needed #180. We will continue the opioid monitoring program, this consists of regular clinic visits, examinations, urine drug screen, pill counts as well as use of [place] Controlled Substance Reporting System.  2. Thoracic Spondylosis: Continue current medication  regime. 01/04/2017 3Cervical spondylosis without evidence of myelopathy: Continue with exercise and heat therapy. 01/04/2017 4. Fibromyalgia: Continue Lyrica( Decreased to 50 mg). Continue with Heat and exercise therapy. 01/04/2017 5.Depression and anxiety: Psychiatry Following. 01/04/2017 6.Orthostatic hypotension: Cardiology Following. 01/04/2017  20 minutes of face to face patient care time was spent during this visit. All questions were encouraged and answered.   F/U in 1 month

## 2017-01-06 ENCOUNTER — Telehealth: Payer: Self-pay | Admitting: Registered Nurse

## 2017-01-06 NOTE — Telephone Encounter (Signed)
On 01/06/2017 the [place] was reviewed no conflict was seen on the [place] with multiple prescribers. [name] has a signed narcotic contract with our office. If there were any discrepancies this would have been reported to her physician.

## 2017-01-07 LAB — TOXASSURE SELECT,+ANTIDEPR,UR

## 2017-01-11 ENCOUNTER — Telehealth: Payer: Self-pay | Admitting: *Deleted

## 2017-01-11 NOTE — Telephone Encounter (Signed)
Urine drug screen for this encounter is consistent for prescribed medication. We are prescribing hydrocodone and it is present as expected. Other medications are from her primary care.

## 2017-02-02 ENCOUNTER — Encounter: Payer: Medicare Other | Admitting: Registered Nurse

## 2017-02-05 ENCOUNTER — Other Ambulatory Visit: Payer: Self-pay | Admitting: *Deleted

## 2017-02-05 ENCOUNTER — Telehealth: Payer: Self-pay | Admitting: *Deleted

## 2017-02-05 DIAGNOSIS — M4716 Other spondylosis with myelopathy, lumbar region: Secondary | ICD-10-CM

## 2017-02-05 DIAGNOSIS — M519 Unspecified thoracic, thoracolumbar and lumbosacral intervertebral disc disorder: Secondary | ICD-10-CM

## 2017-02-05 MED ORDER — HYDROCODONE-ACETAMINOPHEN 7.5-325 MG PO TABS: 2.0000 | ORAL_TABLET | Freq: Three times a day (TID) | ORAL | 0 refills | Status: DC

## 2017-02-05 NOTE — Telephone Encounter (Signed)
Dr. Posey Pronto agreed to write script for a 6 day supply #36.  That will get her through till Thursday when Danella Sensing, ANP and Dr. Letta Pate come back.  We will need to provide her one month supply script for her pain medication.  Anapaula missed her 02/02/2017 appt with Zella Ball due to her mothers sudden amputation surgery.  No other family members were able to be present so she sacrificed her appointment to be with her mom.  Unfortunately  This put her in this predicament.

## 2017-02-05 NOTE — Telephone Encounter (Signed)
I know this is not your patient.  It is Dr. Letta Pate patient.  Due to family situation involving her mother and hospital surgery, she missed her 02/02/2017 appt with Zella Ball.  Her pain medication is going to run out this weekend. The first available appt is not until July 23rd or so.  The medication question his Hydrocodone-acetaminophen 7.5-325 mg.Marland KitchenMarland KitchenMarland KitchenMarland Kitchenplease advise

## 2017-02-10 NOTE — Telephone Encounter (Signed)
Pt will need to get another partial Rx to cover her until next appt

## 2017-02-11 MED ORDER — HYDROCODONE-ACETAMINOPHEN 7.5-325 MG PO TABS: 2.0000 | ORAL_TABLET | Freq: Three times a day (TID) | ORAL | 0 refills | Status: DC

## 2017-02-11 NOTE — Telephone Encounter (Signed)
Contacted [facility] and told her a bridge script would be printed and signed to be picked up front. She will come in Friday 13th, 2018 to pick it up.

## 2017-02-11 NOTE — Addendum Note (Signed)
Addended by: Geryl Rankins D on: 02/11/2017 04:24 PM   Modules accepted: Orders

## 2017-02-11 NOTE — Telephone Encounter (Signed)
Placed a call to [name] regarding her prescription. No answer, left message to return the call. Will write a bridge  Prescription, her next appointment is on 02/22/2017. Awaiting a return call.

## 2017-02-12 DIAGNOSIS — Z6820 Body mass index (BMI) 20.0-20.9, adult: Secondary | ICD-10-CM | POA: Diagnosis not present

## 2017-02-12 DIAGNOSIS — Q055 Cervical spina bifida without hydrocephalus: Secondary | ICD-10-CM | POA: Diagnosis not present

## 2017-02-12 DIAGNOSIS — G43009 Migraine without aura, not intractable, without status migrainosus: Secondary | ICD-10-CM | POA: Diagnosis not present

## 2017-02-22 ENCOUNTER — Encounter: Payer: Medicare Other | Attending: Physical Medicine & Rehabilitation | Admitting: Registered Nurse

## 2017-02-22 ENCOUNTER — Encounter: Payer: Self-pay | Admitting: Registered Nurse

## 2017-02-22 VITALS — BP 119/85 | HR 86 | Temp 97.9°F

## 2017-02-22 DIAGNOSIS — G8929 Other chronic pain: Secondary | ICD-10-CM | POA: Diagnosis not present

## 2017-02-22 DIAGNOSIS — Z760 Encounter for issue of repeat prescription: Secondary | ICD-10-CM | POA: Insufficient documentation

## 2017-02-22 DIAGNOSIS — K589 Irritable bowel syndrome without diarrhea: Secondary | ICD-10-CM | POA: Diagnosis not present

## 2017-02-22 DIAGNOSIS — R002 Palpitations: Secondary | ICD-10-CM | POA: Insufficient documentation

## 2017-02-22 DIAGNOSIS — R51 Headache: Secondary | ICD-10-CM | POA: Insufficient documentation

## 2017-02-22 DIAGNOSIS — F419 Anxiety disorder, unspecified: Secondary | ICD-10-CM | POA: Diagnosis not present

## 2017-02-22 DIAGNOSIS — K0889 Other specified disorders of teeth and supporting structures: Secondary | ICD-10-CM | POA: Diagnosis not present

## 2017-02-22 DIAGNOSIS — F329 Major depressive disorder, single episode, unspecified: Secondary | ICD-10-CM | POA: Diagnosis not present

## 2017-02-22 DIAGNOSIS — M519 Unspecified thoracic, thoracolumbar and lumbosacral intervertebral disc disorder: Secondary | ICD-10-CM

## 2017-02-22 DIAGNOSIS — Z86010 Personal history of colonic polyps: Secondary | ICD-10-CM | POA: Insufficient documentation

## 2017-02-22 DIAGNOSIS — K219 Gastro-esophageal reflux disease without esophagitis: Secondary | ICD-10-CM | POA: Insufficient documentation

## 2017-02-22 DIAGNOSIS — M545 Low back pain: Secondary | ICD-10-CM | POA: Diagnosis not present

## 2017-02-22 DIAGNOSIS — M4716 Other spondylosis with myelopathy, lumbar region: Secondary | ICD-10-CM

## 2017-02-22 DIAGNOSIS — K449 Diaphragmatic hernia without obstruction or gangrene: Secondary | ICD-10-CM | POA: Insufficient documentation

## 2017-02-22 DIAGNOSIS — G44229 Chronic tension-type headache, not intractable: Secondary | ICD-10-CM

## 2017-02-22 DIAGNOSIS — Z5181 Encounter for therapeutic drug level monitoring: Secondary | ICD-10-CM | POA: Diagnosis not present

## 2017-02-22 DIAGNOSIS — K222 Esophageal obstruction: Secondary | ICD-10-CM | POA: Diagnosis not present

## 2017-02-22 DIAGNOSIS — M47812 Spondylosis without myelopathy or radiculopathy, cervical region: Secondary | ICD-10-CM

## 2017-02-22 DIAGNOSIS — I951 Orthostatic hypotension: Secondary | ICD-10-CM | POA: Insufficient documentation

## 2017-02-22 DIAGNOSIS — Z87891 Personal history of nicotine dependence: Secondary | ICD-10-CM | POA: Diagnosis not present

## 2017-02-22 DIAGNOSIS — G894 Chronic pain syndrome: Secondary | ICD-10-CM | POA: Diagnosis not present

## 2017-02-22 DIAGNOSIS — M5136 Other intervertebral disc degeneration, lumbar region: Secondary | ICD-10-CM | POA: Insufficient documentation

## 2017-02-22 DIAGNOSIS — M47892 Other spondylosis, cervical region: Secondary | ICD-10-CM | POA: Insufficient documentation

## 2017-02-22 DIAGNOSIS — R55 Syncope and collapse: Secondary | ICD-10-CM | POA: Diagnosis not present

## 2017-02-22 DIAGNOSIS — M542 Cervicalgia: Secondary | ICD-10-CM | POA: Insufficient documentation

## 2017-02-22 DIAGNOSIS — K648 Other hemorrhoids: Secondary | ICD-10-CM | POA: Diagnosis not present

## 2017-02-22 DIAGNOSIS — M797 Fibromyalgia: Secondary | ICD-10-CM

## 2017-02-22 DIAGNOSIS — Z79899 Other long term (current) drug therapy: Secondary | ICD-10-CM

## 2017-02-22 DIAGNOSIS — R000 Tachycardia, unspecified: Secondary | ICD-10-CM | POA: Insufficient documentation

## 2017-02-22 DIAGNOSIS — Z599 Problem related to housing and economic circumstances, unspecified: Secondary | ICD-10-CM | POA: Insufficient documentation

## 2017-02-22 MED ORDER — HYDROCODONE-ACETAMINOPHEN 7.5-325 MG PO TABS: 2.0000 | ORAL_TABLET | Freq: Three times a day (TID) | ORAL | 0 refills | Status: DC

## 2017-02-22 NOTE — Progress Notes (Deleted)
Subjective:    Patient ID: [name], female    DOB: [birth year], 41 y.o.   MRN: [id number]  HPI  Pain Inventory Average Pain {NUMBERS; 0-10:5044} Pain Right Now {NUMBERS; 0-10:5044} My pain is {PAIN DESCRIPTION:21022940}  In the last 24 hours, has pain interfered with the following? General activity {NUMBERS; 0-10:5044} Relation with others {NUMBERS; 0-10:5044} Enjoyment of life {NUMBERS; 0-10:5044} What TIME of day is your pain at its worst? {TIME OF KXF:81829937} Sleep (in general) {BHH GOOD/FAIR/POOR:22877}  Pain is worse with: {ACTIVITIES:21022942} Pain improves with: {PAIN IMPROVES JIRC:78938101} Relief from Meds: {NUMBERS; 0-10:5044}  Mobility {MOBILITY BPZ:02585277}  Function {FUNCTION:21022946}  Neuro/Psych {NEURO/PSYCH:21022948}  Prior Studies {CPRM PRIOR STUDIES:21022953}  Physicians involved in your care {CPRM PHYSICIANS INVOLVED IN YOUR CARE:21022954}   Family History  Problem Relation Age of Onset  . Diabetes type II Mother   . Colon polyps Mother   . Diverticulitis Mother   . Diabetes Mother   . Heart disease Mother   . Diabetes type II Brother   . Uterine cancer Unknown   . Ovarian cancer Unknown    Social History   Social History  . Marital status: Married    Spouse name: N/A  . Number of children: 2  . Years of education: N/A   Occupational History  . Disability Unemployed   Social History Main Topics  . Smoking status: Former Smoker    Types: Cigarettes    Quit date: 06/30/2011  . Smokeless tobacco: Never Used  . Alcohol use No  . Drug use: No  . Sexual activity: Yes   Other Topics Concern  . Not on file   Social History Narrative  . No narrative on file   Past Surgical History:  Procedure Laterality Date  . CESAREAN SECTION    . Left elbow surgery    . PARTIAL HYSTERECTOMY    . TONSILLECTOMY     Past Medical History:  Diagnosis Date  . Anxiety   . Chronic pain    Dr. Letta Pate  . Degenerative disc  disease    Spinal, small syrinx  . Depression   . Esophageal stricture    Status post dilatation  . Fibromyalgia   . GERD (gastroesophageal reflux disease)   . Hiatal hernia   . Internal hemorrhoids without mention of complication   . Irritable bowel syndrome   . Palpitations    No documented arrhythmias  . Personal history of colonic polyps 10/04/2000   HYPERPLASTIC POLYP  . POTS (postural orthostatic tachycardia syndrome)   . Syncope    Negative tilt table test  . Syringomyelia and syringobulbia ([place])    There were no vitals taken for this visit.  Opioid Risk Score:   Fall Risk Score:  `1  Depression screen PHQ 2/9  Depression screen [facility] 2/9 05/26/2016 02/24/2016 04/23/2015 03/26/2015 02/22/2015 10/22/2014  Decreased Interest 1 1 1 1 1 2   Down, Depressed, Hopeless 1 1 1 1 1 3   PHQ - 2 Score 2 2 2 2 2 5   Altered sleeping - - - - - 2  Tired, decreased energy - - - - - 3  Change in appetite - - - - - 0  Feeling bad or failure about yourself  - - - - - 2  Trouble concentrating - - - - - 1  Moving slowly or fidgety/restless - - - - - 2  Suicidal thoughts - - - - - 0  PHQ-9 Score - - - - - 15  Review of Systems     Objective:   Physical Exam        Assessment & Plan:

## 2017-02-22 NOTE — Progress Notes (Addendum)
Subjective:    Patient ID: [name], female    DOB: Oct 05, 1975, 41 y.o.   MRN: [id number]  HPI: [name] is a 41year old female who returns for follow up appointmentfor chronic pain and medication refill. She states her pain is located in her neck radiating into her left shoulder and  lower back. She rates her pain 6.Her current exercise regime is walking and performing stretching exercises occassionally.   Last UDS was performed on 01/04/2017, it was consistent.    Pain Inventory Average Pain 7 Pain Right Now 6 My pain is sharp, burning, dull, stabbing, tingling and aching  In the last 24 hours, has pain interfered with the following? General activity 8 Relation with others 8 Enjoyment of life 8 What TIME of day is your pain at its worst? daytime, evening, morning Sleep (in general) .  Pain is worse with: walking, bending, sitting, inactivity, standing and some activites Pain improves with: rest, heat/ice, medication and TENS Relief from Meds: 6  Mobility ability to climb steps?  yes do you drive?  yes  Function disabled: date disabled .  Neuro/Psych numbness tremor tingling spasms dizziness  Prior Studies .  Physicians involved in your care .   Family History  Problem Relation Age of Onset  . Diabetes type II Mother   . Colon polyps Mother   . Diverticulitis Mother   . Diabetes Mother   . Heart disease Mother   . Diabetes type II Brother   . Uterine cancer Unknown   . Ovarian cancer Unknown    Social History   Social History  . Marital status: Married    Spouse name: N/A  . Number of children: 2  . Years of education: N/A   Occupational History  . Disability Unemployed   Social History Main Topics  . Smoking status: Former Smoker    Types: Cigarettes    Quit date: 06/30/2011  . Smokeless tobacco: Never Used  . Alcohol use No  . Drug use: No  . Sexual activity: Yes   Other Topics Concern  . None   Social History  Narrative  . None   Past Surgical History:  Procedure Laterality Date  . CESAREAN SECTION    . Left elbow surgery    . PARTIAL HYSTERECTOMY    . TONSILLECTOMY     Past Medical History:  Diagnosis Date  . Anxiety   . Chronic pain    Dr. Letta Pate  . Degenerative disc disease    Spinal, small syrinx  . Depression   . Esophageal stricture    Status post dilatation  . Fibromyalgia   . GERD (gastroesophageal reflux disease)   . Hiatal hernia   . Internal hemorrhoids without mention of complication   . Irritable bowel syndrome   . Palpitations    No documented arrhythmias  . Personal history of colonic polyps 10/04/2000   HYPERPLASTIC POLYP  . POTS (postural orthostatic tachycardia syndrome)   . Syncope    Negative tilt table test  . Syringomyelia and syringobulbia (HCC)    BP 119/85   Pulse 86   Temp 97.9 F (36.6 C)   SpO2 100%   Opioid Risk Score:   Fall Risk Score:  `1  Depression screen PHQ 2/9  Depression screen [facility] 2/9 05/26/2016 02/24/2016 04/23/2015 03/26/2015 02/22/2015 10/22/2014  Decreased Interest 1 1 1 1 1 2   Down, Depressed, Hopeless 1 1 1 1 1 3   PHQ - 2 Score 2 2 2  2 2 5   Altered sleeping - - - - - 2  Tired, decreased energy - - - - - 3  Change in appetite - - - - - 0  Feeling bad or failure about yourself  - - - - - 2  Trouble concentrating - - - - - 1  Moving slowly or fidgety/restless - - - - - 2  Suicidal thoughts - - - - - 0  PHQ-9 Score - - - - - 15    Review of Systems  Constitutional: Positive for unexpected weight change.  HENT: Negative.   Eyes: Negative.   Respiratory: Negative.   Cardiovascular: Negative.   Gastrointestinal: Negative.   Endocrine: Negative.   Genitourinary: Negative.   Musculoskeletal: Negative.   Skin: Negative.   Allergic/Immunologic: Negative.   Neurological: Negative.   Hematological: Negative.   Psychiatric/Behavioral: Negative.   All other systems reviewed and are negative.      Objective:   Physical  Exam  Constitutional: She is oriented to person, place, and time. She appears well-developed and well-nourished.  HENT:  Head: Normocephalic and atraumatic.  Neck: Normal range of motion. Neck supple.  Cervical Paraspinal Tenderness: C-5-C-6  Cardiovascular: Normal rate and regular rhythm.   Pulmonary/Chest: Effort normal and breath sounds normal.  Musculoskeletal:  Normal Muscle Bulk and Muscle Testing Reveals:  Upper Extremities: Full ROM and Muscle Strength 5/5 Lumbar Paraspinal Tenderness: L-5-L-6 Lower Extremities: Full ROM and Muscle Strength 5/5 Arises from Table with ease  Narrow Based Gait     Neurological: She is alert and oriented to person, place, and time.  Skin: Skin is warm and dry.  Psychiatric: She has a normal mood and affect.  Nursing note and vitals reviewed.         Assessment & Plan:  1. Lumbar degenerative disc disease: 02/22/2017 Refilled: HYDROcodone 7.5/325 mg two tablets every 8 hours as needed #180. We will continue the opioid monitoring program, this consists of regular clinic visits, examinations, urine drug screen, pill counts as well as use of [place] Controlled Substance Reporting System.  2. Thoracic Spondylosis: Continue current medication regime. 02/22/2017 3Cervical spondylosis without evidence of myelopathy: Continue with exercise and heat therapy. 02/22/2017 4. Fibromyalgia: Continue Lyrica .Continue with Heat and exercise therapy. 02/22/2017 5.Depression and anxiety: Psychiatry Following. 02/22/2017 6.Orthostatic hypotension: Cardiology Following. 02/22/2017  20 minutes of face to face patient care time was spent during this visit. All questions were encouraged and answered.  F/U in 1 month

## 2017-02-23 ENCOUNTER — Encounter: Payer: Self-pay | Admitting: *Deleted

## 2017-03-29 ENCOUNTER — Encounter: Payer: Medicare Other | Attending: Physical Medicine & Rehabilitation | Admitting: Registered Nurse

## 2017-03-29 ENCOUNTER — Encounter: Payer: Self-pay | Admitting: Registered Nurse

## 2017-03-29 VITALS — BP 101/69 | HR 85 | Resp 14

## 2017-03-29 DIAGNOSIS — K648 Other hemorrhoids: Secondary | ICD-10-CM | POA: Insufficient documentation

## 2017-03-29 DIAGNOSIS — M47812 Spondylosis without myelopathy or radiculopathy, cervical region: Secondary | ICD-10-CM | POA: Diagnosis not present

## 2017-03-29 DIAGNOSIS — K222 Esophageal obstruction: Secondary | ICD-10-CM | POA: Insufficient documentation

## 2017-03-29 DIAGNOSIS — M47892 Other spondylosis, cervical region: Secondary | ICD-10-CM | POA: Diagnosis not present

## 2017-03-29 DIAGNOSIS — R51 Headache: Secondary | ICD-10-CM | POA: Diagnosis not present

## 2017-03-29 DIAGNOSIS — G894 Chronic pain syndrome: Secondary | ICD-10-CM

## 2017-03-29 DIAGNOSIS — F419 Anxiety disorder, unspecified: Secondary | ICD-10-CM | POA: Diagnosis not present

## 2017-03-29 DIAGNOSIS — M5136 Other intervertebral disc degeneration, lumbar region: Secondary | ICD-10-CM | POA: Diagnosis not present

## 2017-03-29 DIAGNOSIS — R000 Tachycardia, unspecified: Secondary | ICD-10-CM | POA: Insufficient documentation

## 2017-03-29 DIAGNOSIS — K0889 Other specified disorders of teeth and supporting structures: Secondary | ICD-10-CM | POA: Diagnosis not present

## 2017-03-29 DIAGNOSIS — R55 Syncope and collapse: Secondary | ICD-10-CM | POA: Insufficient documentation

## 2017-03-29 DIAGNOSIS — Z760 Encounter for issue of repeat prescription: Secondary | ICD-10-CM | POA: Diagnosis not present

## 2017-03-29 DIAGNOSIS — R002 Palpitations: Secondary | ICD-10-CM | POA: Diagnosis not present

## 2017-03-29 DIAGNOSIS — Z599 Problem related to housing and economic circumstances, unspecified: Secondary | ICD-10-CM | POA: Insufficient documentation

## 2017-03-29 DIAGNOSIS — G8929 Other chronic pain: Secondary | ICD-10-CM | POA: Insufficient documentation

## 2017-03-29 DIAGNOSIS — K449 Diaphragmatic hernia without obstruction or gangrene: Secondary | ICD-10-CM | POA: Insufficient documentation

## 2017-03-29 DIAGNOSIS — Z87891 Personal history of nicotine dependence: Secondary | ICD-10-CM | POA: Insufficient documentation

## 2017-03-29 DIAGNOSIS — M47814 Spondylosis without myelopathy or radiculopathy, thoracic region: Secondary | ICD-10-CM | POA: Diagnosis not present

## 2017-03-29 DIAGNOSIS — Z86010 Personal history of colonic polyps: Secondary | ICD-10-CM | POA: Insufficient documentation

## 2017-03-29 DIAGNOSIS — K219 Gastro-esophageal reflux disease without esophagitis: Secondary | ICD-10-CM | POA: Diagnosis not present

## 2017-03-29 DIAGNOSIS — M797 Fibromyalgia: Secondary | ICD-10-CM | POA: Insufficient documentation

## 2017-03-29 DIAGNOSIS — I951 Orthostatic hypotension: Secondary | ICD-10-CM | POA: Insufficient documentation

## 2017-03-29 DIAGNOSIS — M545 Low back pain: Secondary | ICD-10-CM | POA: Diagnosis not present

## 2017-03-29 DIAGNOSIS — M542 Cervicalgia: Secondary | ICD-10-CM

## 2017-03-29 DIAGNOSIS — K589 Irritable bowel syndrome without diarrhea: Secondary | ICD-10-CM | POA: Insufficient documentation

## 2017-03-29 DIAGNOSIS — F329 Major depressive disorder, single episode, unspecified: Secondary | ICD-10-CM | POA: Diagnosis not present

## 2017-03-29 DIAGNOSIS — M4716 Other spondylosis with myelopathy, lumbar region: Secondary | ICD-10-CM | POA: Diagnosis not present

## 2017-03-29 DIAGNOSIS — Z79899 Other long term (current) drug therapy: Secondary | ICD-10-CM | POA: Diagnosis not present

## 2017-03-29 DIAGNOSIS — Z5181 Encounter for therapeutic drug level monitoring: Secondary | ICD-10-CM

## 2017-03-29 MED ORDER — HYDROCODONE-ACETAMINOPHEN 7.5-325 MG PO TABS: 2.0000 | ORAL_TABLET | Freq: Three times a day (TID) | ORAL | 0 refills | Status: DC

## 2017-03-29 NOTE — Progress Notes (Signed)
Subjective:    Patient ID: [name], female    DOB: [birth year], 41 y.o.   MRN: [id number]  HPI: [name] is a 41year old female who returns for follow up appointmentfor chronic pain and medication refill. She states her pain is located in her neck radiating into her bilateral  shoulders and upper back. She rates her pain 7.Her current exercise regime is walking and performing stretching exercises occassionally.   Last UDS was performed on 01/04/2017, it was consistent.    Pain Inventory Average Pain 7 Pain Right Now 7 My pain is sharp, burning, dull, stabbing, tingling and aching  In the last 24 hours, has pain interfered with the following? General activity 8 Relation with others 8 Enjoyment of life 8 What TIME of day is your pain at its worst? morning, evening, night Sleep (in general) Poor  Pain is worse with: walking, bending, sitting, inactivity, standing and some activites Pain improves with: rest, heat/ice, medication and TENS Relief from Meds: 6  Mobility walk without assistance ability to climb steps?  yes do you drive?  yes Do you have any goals in this area?  yes  Function disabled: date disabled . I need assistance with the following:  meal prep, household duties and shopping Do you have any goals in this area?  yes  Neuro/Psych weakness numbness tremor tingling spasms confusion depression anxiety  Prior Studies Any changes since last visit?  no  Physicians involved in your care Any changes since last visit?  no   Family History  Problem Relation Age of Onset  . Diabetes type II Mother   . Colon polyps Mother   . Diverticulitis Mother   . Diabetes Mother   . Heart disease Mother   . Diabetes type II Brother   . Uterine cancer Unknown   . Ovarian cancer Unknown    Social History   Social History  . Marital status: Married    Spouse name: N/A  . Number of children: 2  . Years of education: N/A   Occupational  History  . Disability Unemployed   Social History Main Topics  . Smoking status: Former Smoker    Types: Cigarettes    Quit date: 06/30/2011  . Smokeless tobacco: Never Used  . Alcohol use No  . Drug use: No  . Sexual activity: Yes   Other Topics Concern  . None   Social History Narrative  . None   Past Surgical History:  Procedure Laterality Date  . CESAREAN SECTION    . Left elbow surgery    . PARTIAL HYSTERECTOMY    . TONSILLECTOMY     Past Medical History:  Diagnosis Date  . Anxiety   . Chronic pain    Dr. Letta Pate  . Degenerative disc disease    Spinal, small syrinx  . Depression   . Esophageal stricture    Status post dilatation  . Fibromyalgia   . GERD (gastroesophageal reflux disease)   . Hiatal hernia   . Internal hemorrhoids without mention of complication   . Irritable bowel syndrome   . Palpitations    No documented arrhythmias  . Personal history of colonic polyps 10/04/2000   HYPERPLASTIC POLYP  . POTS (postural orthostatic tachycardia syndrome)   . Syncope    Negative tilt table test  . Syringomyelia and syringobulbia (HCC)    BP 101/69 (BP Location: Left Arm, Patient Position: Sitting, Cuff Size: Normal)   Pulse 85   Resp 14  SpO2 97%   Opioid Risk Score:   Fall Risk Score:  `1  Depression screen PHQ 2/9  Depression screen [facility] 2/9 05/26/2016 02/24/2016 04/23/2015 03/26/2015 02/22/2015 10/22/2014  Decreased Interest 1 1 1 1 1 2   Down, Depressed, Hopeless 1 1 1 1 1 3   PHQ - 2 Score 2 2 2 2 2 5   Altered sleeping - - - - - 2  Tired, decreased energy - - - - - 3  Change in appetite - - - - - 0  Feeling bad or failure about yourself  - - - - - 2  Trouble concentrating - - - - - 1  Moving slowly or fidgety/restless - - - - - 2  Suicidal thoughts - - - - - 0  PHQ-9 Score - - - - - 15    Review of Systems  HENT: Negative.   Eyes: Negative.   Respiratory: Negative.   Cardiovascular: Negative.   Gastrointestinal: Negative.   Endocrine:  Negative.   Genitourinary: Negative.   Musculoskeletal: Positive for arthralgias, back pain, myalgias and neck pain.  Skin: Negative.   Allergic/Immunologic: Negative.   Neurological: Positive for tremors, weakness, numbness and headaches.       Tingling  Hematological: Negative.   Psychiatric/Behavioral: Positive for confusion and dysphoric mood. The patient is nervous/anxious.   All other systems reviewed and are negative.      Objective:   Physical Exam  Constitutional: She is oriented to person, place, and time. She appears well-developed and well-nourished.  HENT:  Head: Normocephalic and atraumatic.  Neck: Normal range of motion. Neck supple.  Cervical Paraspinal Tenderness: C-5-C-6  Cardiovascular: Normal rate and regular rhythm.   Pulmonary/Chest: Effort normal and breath sounds normal.  Musculoskeletal:  Normal Muscle Bulk and Muscle Testing Reveals: Upper Extremities: Full ROM and Muscle Strength 5/5 Thoracic Paraspinal Tenderness: T-1-T-3 Lower Extremities: Full ROM and Muscle Strength 5/5 Arises from chair with ease Narrow Based Gait  Neurological: She is alert and oriented to person, place, and time.  Skin: Skin is warm and dry.  Psychiatric: She has a normal mood and affect.  Nursing note and vitals reviewed.         Assessment & Plan:  1. Lumbar degenerative disc disease: 03/29/2017 Refilled: HYDROcodone 7.5/325 mg two tablets every 8 hours as needed #180. We will continue the opioid monitoring program, this consists of regular clinic visits, examinations, urine drug screen, pill counts as well as use of [place] Controlled Substance Reporting System.  2. Thoracic Spondylosis: Continue HEP and  current medication regime. 03/29/2017 3 Cervicalgia/ Cervical spondylosis without evidence of myelopathy: Continue with exercise and heat therapy. 03/29/2017 4. Fibromyalgia: Continue Lyrica .Continue with Heat and exercise therapy.  03/29/2017 5.Depression and anxiety: PCP Following. 03/29/2017 6.Orthostatic hypotension: Cardiology Following. 03/29/2017  20 minutes of face to face patient care time was spent during this visit. All questions were encouraged and answered.  F/U in 1 month

## 2017-04-16 DIAGNOSIS — Z1389 Encounter for screening for other disorder: Secondary | ICD-10-CM | POA: Diagnosis not present

## 2017-04-16 DIAGNOSIS — M2550 Pain in unspecified joint: Secondary | ICD-10-CM | POA: Diagnosis not present

## 2017-04-16 DIAGNOSIS — E782 Mixed hyperlipidemia: Secondary | ICD-10-CM | POA: Diagnosis not present

## 2017-04-16 DIAGNOSIS — H04123 Dry eye syndrome of bilateral lacrimal glands: Secondary | ICD-10-CM | POA: Diagnosis not present

## 2017-04-16 DIAGNOSIS — G950 Syringomyelia and syringobulbia: Secondary | ICD-10-CM | POA: Diagnosis not present

## 2017-04-16 DIAGNOSIS — Z6820 Body mass index (BMI) 20.0-20.9, adult: Secondary | ICD-10-CM | POA: Diagnosis not present

## 2017-04-16 DIAGNOSIS — M19071 Primary osteoarthritis, right ankle and foot: Secondary | ICD-10-CM | POA: Diagnosis not present

## 2017-04-23 ENCOUNTER — Encounter: Payer: Self-pay | Admitting: Registered Nurse

## 2017-04-23 ENCOUNTER — Encounter: Payer: Medicare Other | Attending: Physical Medicine & Rehabilitation | Admitting: Registered Nurse

## 2017-04-23 VITALS — BP 113/77 | HR 83 | Resp 14

## 2017-04-23 DIAGNOSIS — K648 Other hemorrhoids: Secondary | ICD-10-CM | POA: Insufficient documentation

## 2017-04-23 DIAGNOSIS — G894 Chronic pain syndrome: Secondary | ICD-10-CM | POA: Diagnosis not present

## 2017-04-23 DIAGNOSIS — F419 Anxiety disorder, unspecified: Secondary | ICD-10-CM | POA: Diagnosis not present

## 2017-04-23 DIAGNOSIS — M47814 Spondylosis without myelopathy or radiculopathy, thoracic region: Secondary | ICD-10-CM | POA: Diagnosis not present

## 2017-04-23 DIAGNOSIS — R55 Syncope and collapse: Secondary | ICD-10-CM | POA: Insufficient documentation

## 2017-04-23 DIAGNOSIS — M5136 Other intervertebral disc degeneration, lumbar region: Secondary | ICD-10-CM | POA: Insufficient documentation

## 2017-04-23 DIAGNOSIS — I951 Orthostatic hypotension: Secondary | ICD-10-CM | POA: Diagnosis not present

## 2017-04-23 DIAGNOSIS — K222 Esophageal obstruction: Secondary | ICD-10-CM | POA: Insufficient documentation

## 2017-04-23 DIAGNOSIS — M797 Fibromyalgia: Secondary | ICD-10-CM | POA: Insufficient documentation

## 2017-04-23 DIAGNOSIS — K0889 Other specified disorders of teeth and supporting structures: Secondary | ICD-10-CM | POA: Insufficient documentation

## 2017-04-23 DIAGNOSIS — M47892 Other spondylosis, cervical region: Secondary | ICD-10-CM | POA: Diagnosis not present

## 2017-04-23 DIAGNOSIS — Z760 Encounter for issue of repeat prescription: Secondary | ICD-10-CM | POA: Insufficient documentation

## 2017-04-23 DIAGNOSIS — K219 Gastro-esophageal reflux disease without esophagitis: Secondary | ICD-10-CM | POA: Diagnosis not present

## 2017-04-23 DIAGNOSIS — Z599 Problem related to housing and economic circumstances, unspecified: Secondary | ICD-10-CM | POA: Insufficient documentation

## 2017-04-23 DIAGNOSIS — K589 Irritable bowel syndrome without diarrhea: Secondary | ICD-10-CM | POA: Diagnosis not present

## 2017-04-23 DIAGNOSIS — Z87891 Personal history of nicotine dependence: Secondary | ICD-10-CM | POA: Diagnosis not present

## 2017-04-23 DIAGNOSIS — M47812 Spondylosis without myelopathy or radiculopathy, cervical region: Secondary | ICD-10-CM

## 2017-04-23 DIAGNOSIS — K449 Diaphragmatic hernia without obstruction or gangrene: Secondary | ICD-10-CM | POA: Diagnosis not present

## 2017-04-23 DIAGNOSIS — R002 Palpitations: Secondary | ICD-10-CM | POA: Insufficient documentation

## 2017-04-23 DIAGNOSIS — G8929 Other chronic pain: Secondary | ICD-10-CM | POA: Diagnosis not present

## 2017-04-23 DIAGNOSIS — M545 Low back pain: Secondary | ICD-10-CM | POA: Diagnosis not present

## 2017-04-23 DIAGNOSIS — Z86010 Personal history of colonic polyps: Secondary | ICD-10-CM | POA: Insufficient documentation

## 2017-04-23 DIAGNOSIS — R51 Headache: Secondary | ICD-10-CM | POA: Insufficient documentation

## 2017-04-23 DIAGNOSIS — M542 Cervicalgia: Secondary | ICD-10-CM | POA: Diagnosis not present

## 2017-04-23 DIAGNOSIS — Z79899 Other long term (current) drug therapy: Secondary | ICD-10-CM

## 2017-04-23 DIAGNOSIS — Z5181 Encounter for therapeutic drug level monitoring: Secondary | ICD-10-CM

## 2017-04-23 DIAGNOSIS — F329 Major depressive disorder, single episode, unspecified: Secondary | ICD-10-CM | POA: Insufficient documentation

## 2017-04-23 DIAGNOSIS — R000 Tachycardia, unspecified: Secondary | ICD-10-CM | POA: Insufficient documentation

## 2017-04-23 DIAGNOSIS — M4716 Other spondylosis with myelopathy, lumbar region: Secondary | ICD-10-CM | POA: Diagnosis not present

## 2017-04-23 MED ORDER — PREGABALIN 50 MG PO CAPS: 50.0000 mg | ORAL_CAPSULE | Freq: Two times a day (BID) | ORAL | 2 refills | Status: DC

## 2017-04-23 MED ORDER — HYDROCODONE-ACETAMINOPHEN 7.5-325 MG PO TABS: 2.0000 | ORAL_TABLET | Freq: Three times a day (TID) | ORAL | 0 refills | Status: DC

## 2017-04-23 NOTE — Progress Notes (Signed)
Subjective:    [name], female    DOB: [birth year], 41 y.o.   MRN: [id number]  HPI: [name] is a 41year old female who returns for follow up appointmentfor chronic pain and medication refill. She states her pain is located in her neck radiating into her bilateral  shoulders also states she has a headache.Also reports she's having "Fibro Pain". In April Lyrica 75 mg was prescribed she tried Sabrina 75 mg BID dose for two months, her Lyrica had to be decreased to 50 mg due to daytime drowsiness. [name] believes she has some of Sabrina 75 mg capsules at home and would like to see if she tolerate Sabrina dosage. She was instructed to call office on Monday morning 04/26/2017, we will see if she's  able to tolerate Sabrina 75 mg dosage. If she's able to achieve Sabrina above our next step will be decreased Sabrina Hydrocodone to 5 mg two tablets three times a day per Dr. Letta Pate plan, she verbalizes understanding.  She rates her pain 6..Her current exercise regime is walking and performing stretching exercises occassionally.   Last UDS was performed on 01/04/2017, it was consistent.    Pain Inventory Average Pain 7 Pain Right Now 6 My pain is intermittent, constant, sharp, burning, dull, stabbing, tingling and aching  In Sabrina last 24 hours, has pain interfered with Sabrina following? General activity 8 Relation with others 9 Enjoyment of life 9 What TIME of day is your pain at its worst? morning, evening, night Sleep (in general) Poor  Pain is worse with: walking, bending, sitting, inactivity, standing and some activites Pain improves with: rest, heat/ice, medication and TENS Relief from Meds: 6  Mobility walk without assistance how many minutes can you walk? 20 ability to climb steps?  yes do you drive?  yes Do you have any goals in this area?  yes  Function disabled: date disabled  .  Neuro/Psych weakness numbness tremor tingling spasms confusion depression anxiety  Prior Studies Any changes since last visit?  no  Physicians involved in your care Any changes since last visit?  no   Family History  Problem Relation Age of Onset  . Diabetes type II Mother   . Colon polyps Mother   . Diverticulitis Mother   . Diabetes Mother   . Heart disease Mother   . Diabetes type II Brother   . Uterine cancer Unknown   . Ovarian cancer Unknown    Social History   Social History  . Marital status: Married    Spouse name: N/A  . Number of children: 2  . Years of education: N/A   Occupational History  . Disability Unemployed   Social History Main Topics  . Smoking status: Former Smoker    Types: Cigarettes    Quit date: 06/30/2011  . Smokeless tobacco: Never Used  . Alcohol use No  . Drug use: No  . Sexual activity: Yes   Other Topics Concern  . None   Social History Narrative  . None   Past Surgical History:  Procedure Laterality Date  . CESAREAN SECTION    . Left elbow surgery    . PARTIAL HYSTERECTOMY    . TONSILLECTOMY     Past Medical History:  Diagnosis Date  . Anxiety   . Chronic pain    Dr. Letta Pate  . Degenerative disc disease    Spinal, small syrinx  . Depression   . Esophageal stricture    Status  post dilatation  . Fibromyalgia   . GERD (gastroesophageal reflux disease)   . Hiatal hernia   . Internal hemorrhoids without mention of complication   . Irritable bowel syndrome   . Palpitations    No documented arrhythmias  . Personal history of colonic polyps 10/04/2000   HYPERPLASTIC POLYP  . POTS (postural orthostatic tachycardia syndrome)   . Syncope    Negative tilt table test  . Syringomyelia and syringobulbia (HCC)    BP 113/77 (BP Location: Left Arm, Shea Position: Sitting, Cuff Size: Normal)   Pulse 83   Resp 14   SpO2 99%   Opioid Risk Score:   Fall Risk Score:  `1  Depression screen PHQ  2/9  Depression screen [facility] 2/9 05/26/2016 02/24/2016 04/23/2015 03/26/2015 02/22/2015 10/22/2014  Decreased Interest 1 1 1 1 1 2   Down, Depressed, Hopeless 1 1 1 1 1 3   PHQ - 2 Score 2 2 2 2 2 5   Altered sleeping - - - - - 2  Tired, decreased energy - - - - - 3  Change in appetite - - - - - 0  Feeling bad or failure about yourself  - - - - - 2  Trouble concentrating - - - - - 1  Moving slowly or fidgety/restless - - - - - 2  Suicidal thoughts - - - - - 0  PHQ-9 Score - - - - - 15    Review of Systems  HENT: Negative.   Eyes: Negative.   Respiratory: Negative.   Cardiovascular: Negative.   Gastrointestinal: Negative.   Endocrine: Negative.   Genitourinary: Negative.   Musculoskeletal: Positive for arthralgias, back pain, myalgias and neck pain.  Skin: Negative.   Allergic/Immunologic: Negative.   Neurological: Positive for tremors, weakness, numbness and headaches.       Tingling  Hematological: Negative.   Psychiatric/Behavioral: Positive for confusion and dysphoric mood. [name] is nervous/anxious.   All other systems reviewed and are negative.      Objective:   Physical Exam  Constitutional: She is oriented to person, place, and time. She appears well-developed and well-nourished.  HENT:  Head: Normocephalic and atraumatic.  Neck: Normal range of motion. Neck supple.  Cervical Paraspinal Tenderness: C-5-C-6  Cardiovascular: Normal rate and regular rhythm.   Pulmonary/Chest: Effort normal and breath sounds normal.  Musculoskeletal:  Normal Muscle Bulk and Muscle Testing Reveals: Upper Extremities: Full ROM and Muscle Strength 5/5 Back without spinal tenderness noted Lower Extremities: Full ROM and Muscle Strength 5/5 Arises from chair with ease Narrow Based Gait  Neurological: She is alert and oriented to person, place, and time.  Skin: Skin is warm and dry.  Psychiatric: She has a normal mood and affect.  Nursing note and vitals reviewed.          Assessment & Plan:  1. Lumbar degenerative disc disease: 04/23/2017 Refilled: HYDROcodone 7.5/325 mg two tablets every 8 hours as needed #180. We will continue Sabrina opioid monitoring program, this consists of regular clinic visits, examinations, urine drug screen, pill counts as well as use of [place] Controlled Substance Reporting System.  2. Thoracic Spondylosis: Continue HEP and  current medication regime. 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 04/23/2017 4. Fibromyalgia: Continue Lyrica .Continue with Heat and exercise therapy. 04/23/2017 5.Depression and anxiety: PCP Following. 04/23/2017 6.Orthostatic hypotension: Cardiology Following. 04/23/2017  20 minutes of face to face Shea care time was spent during this visit. All questions  were encouraged and answered.  F/U in 1 month

## 2017-04-24 ENCOUNTER — Telehealth: Payer: Self-pay | Admitting: Registered Nurse

## 2017-04-24 NOTE — Telephone Encounter (Signed)
On 04/24/2017 the [place] was reviewed no conflict was seen on the [place] with multiple prescribers. [name] has a signed narcotic contract with our office. If there were any discrepancies this would have been reported to her physician.

## 2017-05-05 ENCOUNTER — Encounter: Payer: Self-pay | Admitting: *Deleted

## 2017-05-06 ENCOUNTER — Other Ambulatory Visit: Payer: Medicare Other | Admitting: Obstetrics and Gynecology

## 2017-05-24 ENCOUNTER — Encounter: Payer: Self-pay | Admitting: Registered Nurse

## 2017-05-24 ENCOUNTER — Encounter: Payer: Medicare Other | Attending: Physical Medicine & Rehabilitation | Admitting: Registered Nurse

## 2017-05-24 ENCOUNTER — Telehealth: Payer: Self-pay | Admitting: Registered Nurse

## 2017-05-24 VITALS — BP 113/77 | HR 79

## 2017-05-24 DIAGNOSIS — M545 Low back pain: Secondary | ICD-10-CM | POA: Insufficient documentation

## 2017-05-24 DIAGNOSIS — K219 Gastro-esophageal reflux disease without esophagitis: Secondary | ICD-10-CM | POA: Diagnosis not present

## 2017-05-24 DIAGNOSIS — Z760 Encounter for issue of repeat prescription: Secondary | ICD-10-CM | POA: Insufficient documentation

## 2017-05-24 DIAGNOSIS — M47814 Spondylosis without myelopathy or radiculopathy, thoracic region: Secondary | ICD-10-CM

## 2017-05-24 DIAGNOSIS — K589 Irritable bowel syndrome without diarrhea: Secondary | ICD-10-CM | POA: Insufficient documentation

## 2017-05-24 DIAGNOSIS — K222 Esophageal obstruction: Secondary | ICD-10-CM | POA: Insufficient documentation

## 2017-05-24 DIAGNOSIS — M5136 Other intervertebral disc degeneration, lumbar region: Secondary | ICD-10-CM | POA: Diagnosis not present

## 2017-05-24 DIAGNOSIS — G8929 Other chronic pain: Secondary | ICD-10-CM | POA: Insufficient documentation

## 2017-05-24 DIAGNOSIS — R55 Syncope and collapse: Secondary | ICD-10-CM | POA: Insufficient documentation

## 2017-05-24 DIAGNOSIS — M47812 Spondylosis without myelopathy or radiculopathy, cervical region: Secondary | ICD-10-CM

## 2017-05-24 DIAGNOSIS — F329 Major depressive disorder, single episode, unspecified: Secondary | ICD-10-CM | POA: Insufficient documentation

## 2017-05-24 DIAGNOSIS — M797 Fibromyalgia: Secondary | ICD-10-CM | POA: Diagnosis not present

## 2017-05-24 DIAGNOSIS — K0889 Other specified disorders of teeth and supporting structures: Secondary | ICD-10-CM | POA: Insufficient documentation

## 2017-05-24 DIAGNOSIS — Z87891 Personal history of nicotine dependence: Secondary | ICD-10-CM | POA: Insufficient documentation

## 2017-05-24 DIAGNOSIS — I951 Orthostatic hypotension: Secondary | ICD-10-CM | POA: Insufficient documentation

## 2017-05-24 DIAGNOSIS — M47892 Other spondylosis, cervical region: Secondary | ICD-10-CM | POA: Diagnosis not present

## 2017-05-24 DIAGNOSIS — F419 Anxiety disorder, unspecified: Secondary | ICD-10-CM | POA: Diagnosis not present

## 2017-05-24 DIAGNOSIS — Z599 Problem related to housing and economic circumstances, unspecified: Secondary | ICD-10-CM | POA: Diagnosis not present

## 2017-05-24 DIAGNOSIS — R002 Palpitations: Secondary | ICD-10-CM | POA: Insufficient documentation

## 2017-05-24 DIAGNOSIS — R000 Tachycardia, unspecified: Secondary | ICD-10-CM | POA: Insufficient documentation

## 2017-05-24 DIAGNOSIS — M4716 Other spondylosis with myelopathy, lumbar region: Secondary | ICD-10-CM

## 2017-05-24 DIAGNOSIS — Z5181 Encounter for therapeutic drug level monitoring: Secondary | ICD-10-CM

## 2017-05-24 DIAGNOSIS — M542 Cervicalgia: Secondary | ICD-10-CM | POA: Diagnosis not present

## 2017-05-24 DIAGNOSIS — K449 Diaphragmatic hernia without obstruction or gangrene: Secondary | ICD-10-CM | POA: Insufficient documentation

## 2017-05-24 DIAGNOSIS — Z86010 Personal history of colonic polyps: Secondary | ICD-10-CM | POA: Insufficient documentation

## 2017-05-24 DIAGNOSIS — R51 Headache: Secondary | ICD-10-CM | POA: Diagnosis not present

## 2017-05-24 DIAGNOSIS — K648 Other hemorrhoids: Secondary | ICD-10-CM | POA: Insufficient documentation

## 2017-05-24 DIAGNOSIS — G894 Chronic pain syndrome: Secondary | ICD-10-CM

## 2017-05-24 DIAGNOSIS — Z79899 Other long term (current) drug therapy: Secondary | ICD-10-CM

## 2017-05-24 MED ORDER — HYDROCODONE-ACETAMINOPHEN 7.5-325 MG PO TABS: 2.0000 | ORAL_TABLET | Freq: Three times a day (TID) | ORAL | 0 refills | Status: DC

## 2017-05-24 NOTE — Progress Notes (Signed)
Subjective:    Patient ID: [name], female    DOB: 1976-04-25, 41 y.o.   MRN: [id number]  HPI: [name] is a 41year old female who returns for follow up appointmentfor chronic pain and medication refill. She states her pain is located in her neck radiating into her bilateral  Shoulders and mid-lower back.  .[name] has taken her Lyrica 50 mg BID for the last month,and states she has tolerated the medication we will increase to TID, we will titrate slowly due to daytime drowsiness.This was discussed with Dr. Letta Pate and he agrees with plan. If she's able to achieve the above our next step will be decreased the Hydrocodone to 5 mg two tablets three times a day per Dr. Letta Pate plan, she verbalizes understanding.  She rates her pain 6..Her current exercise regime is walking and performing stretching exercises occassionally.   [name] Morphine equivalent is  45.00 MME.  She is also prescribed Klonopin   by Dr. Gerarda Fraction .We have discussed the black box warning of using opioids and benzodiazepines.I highlighted the dangers of using these drugs together and discussed the adverse events including respiratory suppression, overdose, cognitive impairment and importance of  compliance with current regimen. She verbalizes understanding, we will continue to monitor and adjust as indicated.    [name] states she onlu uses the Klonopin sporadically, according to PMP Aware site  She picked up the [place] on 04/16/2017 and 11/08/2016.   Last UDS was performed on 01/04/2017, it was consistent.    Pain Inventory Average Pain 7 Pain Right Now 6 My pain is intermittent, constant, sharp, burning, dull, stabbing, tingling and aching  In the last 24 hours, has pain interfered with the following? General activity 7 Relation with others 7 Enjoyment of life 7 What TIME of day is your pain at its worst? morning and night, evening, night Sleep (in general) Fair Pain is worse with:  walking, bending, sitting, inactivity, standing, unsure and some activites Pain improves with: rest, heat/ice, medication and TENS Relief from Meds: 6  Mobility walk without assistance how many minutes can you walk? 25 ability to climb steps?  yes do you drive?  yes Do you have any goals in this area?  yes  Function disabled: date disabled .  Neuro/Psych weakness numbness tremor tingling spasms dizziness confusion depression anxiety  Prior Studies Any changes since last visit?  no  Physicians involved in your care Any changes since last visit?  no   Family History  Problem Relation Age of Onset  . Diabetes type II Mother   . Colon polyps Mother   . Diverticulitis Mother   . Diabetes Mother   . Heart disease Mother   . Osteoporosis Father   . Diabetes type II Brother   . Uterine cancer Unknown   . Ovarian cancer Unknown   . COPD Paternal Grandfather   . Heart attack Maternal Grandmother    Social History   Social History  . Marital status: Married    Spouse name: N/A  . Number of children: 2  . Years of education: N/A   Occupational History  . Disability Unemployed   Social History Main Topics  . Smoking status: Current Every Day Smoker    Packs/day: 0.50    Types: Cigarettes    Last attempt to quit: 06/30/2011  . Smokeless tobacco: Never Used  . Alcohol use No  . Drug use: No  . Sexual activity: Yes   Other Topics Concern  .  Not on file   Social History Narrative  . No narrative on file   Past Surgical History:  Procedure Laterality Date  . CESAREAN SECTION    . Left elbow surgery    . PARTIAL HYSTERECTOMY    . TONSILLECTOMY     Past Medical History:  Diagnosis Date  . Anxiety   . Chronic pain    Dr. Letta Pate  . Degenerative disc disease    Spinal, small syrinx  . Depression   . Esophageal stricture    Status post dilatation  . Fibromyalgia   . GERD (gastroesophageal reflux disease)   . Hiatal hernia   . Internal  hemorrhoids without mention of complication   . Irritable bowel syndrome   . Palpitations    No documented arrhythmias  . Personal history of colonic polyps 10/04/2000   HYPERPLASTIC POLYP  . POTS (postural orthostatic tachycardia syndrome)   . Syncope    Negative tilt table test  . Syringomyelia and syringobulbia ([place])    There were no vitals taken for this visit.  Opioid Risk Score:  4 Fall Risk Score:  `1  Depression screen PHQ 2/9  Depression screen [facility] 2/9 05/24/2017 05/26/2016 02/24/2016 04/23/2015 03/26/2015 02/22/2015 10/22/2014  Decreased Interest 1 1 1 1 1 1 2   Down, Depressed, Hopeless 1 1 1 1 1 1 3   PHQ - 2 Score 2 2 2 2 2 2 5   Altered sleeping - - - - - - 2  Tired, decreased energy - - - - - - 3  Change in appetite - - - - - - 0  Feeling bad or failure about yourself  - - - - - - 2  Trouble concentrating - - - - - - 1  Moving slowly or fidgety/restless - - - - - - 2  Suicidal thoughts - - - - - - 0  PHQ-9 Score - - - - - - 15    Review of Systems  HENT: Negative.   Eyes: Negative.   Respiratory: Negative.   Cardiovascular: Negative.   Gastrointestinal: Negative.   Endocrine: Negative.   Genitourinary: Negative.   Musculoskeletal: Positive for arthralgias, back pain, myalgias and neck pain.  Skin: Negative.   Allergic/Immunologic: Negative.   Neurological: Positive for tremors, weakness, numbness and headaches.       Tingling  Hematological: Negative.   Psychiatric/Behavioral: Positive for confusion and dysphoric mood. The patient is nervous/anxious.   All other systems reviewed and are negative.      Objective:   Physical Exam  Constitutional: She is oriented to person, place, and time. She appears well-developed and well-nourished.  HENT:  Head: Normocephalic and atraumatic.  Neck: Normal range of motion. Neck supple.  Cervical Paraspinal Tenderness: C-5-C-6  Cardiovascular: Normal rate and regular rhythm.   Pulmonary/Chest: Effort normal and breath  sounds normal.  Musculoskeletal:  Normal Muscle Bulk and Muscle Testing Reveals: Upper Extremities: Full ROM and Muscle Strength 5/5 Thoracic Paraspinal Tenderness: T-3-T-6 Lower Extremities: Full ROM and Muscle Strength 5/5 Arises from chair with ease Narrow Based Gait  Neurological: She is alert and oriented to person, place, and time.  Skin: Skin is warm and dry.  Psychiatric: She has a normal mood and affect.  Nursing note and vitals reviewed.         Assessment & Plan:  1. Lumbar degenerative disc disease: 05/24/2017 Refilled: HYDROcodone 7.5/325 mg two tablets every 8 hours as needed #180. We will continue the opioid monitoring program, this consists of regular  clinic visits, examinations, urine drug screen, pill counts as well as use of [place] Controlled Substance Reporting System.  2. Thoracic Spondylosis: Continue HEP and  current medication regime. 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 05/24/2017 4. Fibromyalgia: Increase  Lyrica 50 mg TID, call office in a week to evaluate medication changes. Continue with Heat and exercise therapy. 05/24/2017 5.Depression and anxiety: PCP Following. 05/24/2017 6.Orthostatic hypotension: Cardiology Following. 05/24/2017  20 minutes of face to face patient care time was spent during this visit. All questions were encouraged and answered.  F/U in 1 month

## 2017-05-24 NOTE — Telephone Encounter (Signed)
On 05/24/2017 the [place] was reviewed no conflict was seen on the [place] with multiple prescribers. [name] has a signed narcotic contract with our office. If there were any discrepancies this would have been reported to her physician.

## 2017-05-24 NOTE — Patient Instructions (Addendum)
Increase Lyrica 50 mg capsule to three times a day, start 05/25/2017.  Call office on 10/32/2018 to evaluate medication change    Call office with any questions or concerns: [phone]

## 2017-06-14 IMAGING — CR DG CHEST 2V
2 series · 2 of 2 positions shown · non-contrast
Comparison: 01/04/2013

CLINICAL DATA: Right side chest swelling

EXAM:
CHEST  2 VIEW

[view not recorded (1 of 2)]
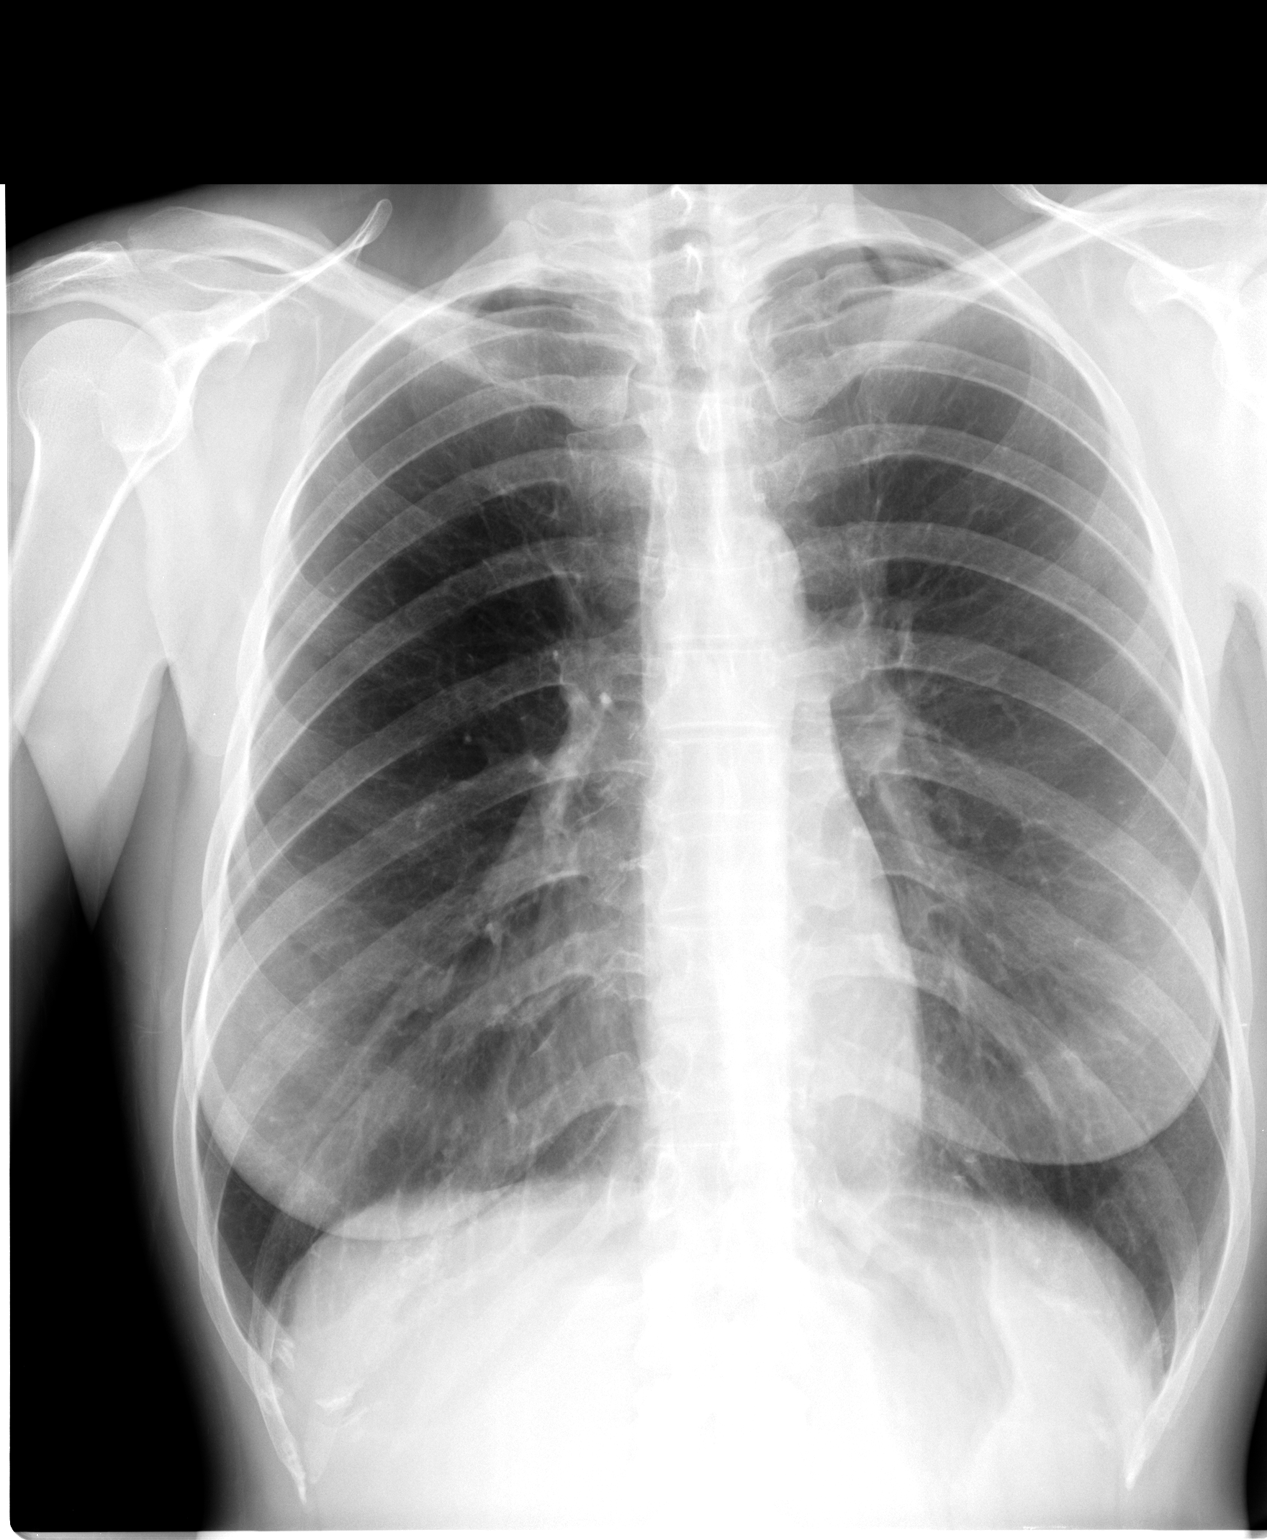

[view not recorded (2 of 2)]
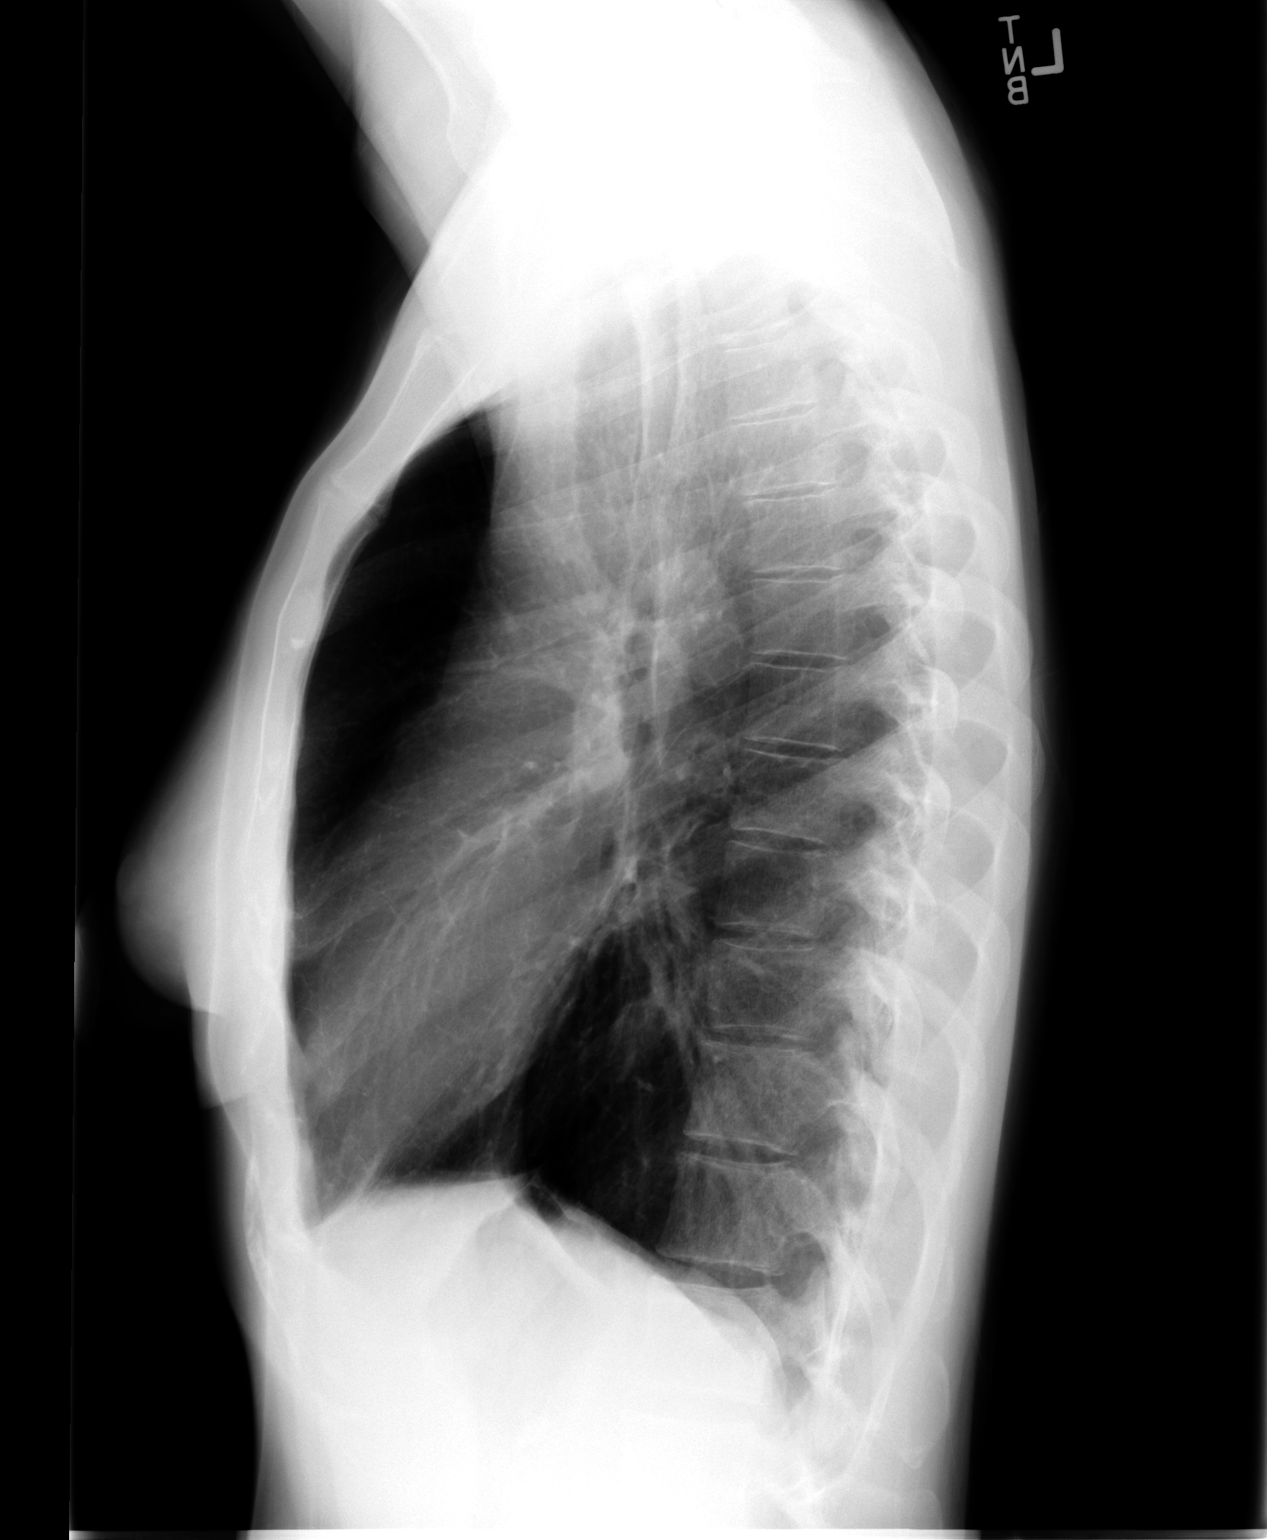

[2 of 2 positions shown; findings below may reference images not displayed]

FINDINGS: Cardiomediastinal silhouette is stable. No acute infiltrate or
pleural effusion. No pulmonary edema. Bony thorax is unremarkable.
IMPRESSION: No active cardiopulmonary disease.

## 2017-06-15 ENCOUNTER — Telehealth: Payer: Self-pay | Admitting: *Deleted

## 2017-06-15 NOTE — Telephone Encounter (Signed)
Patient left a message for Zella Ball to let her know that Lyrica 50 mg TID was working good.  She thought she needed a refill called in. l lI contacted her back and explained that a refill was previously ordered. I advised her to call her pharmacy.  She did.  Pharmacy informed she indeed had a refill available.  Please call patient

## 2017-06-16 MED ORDER — PREGABALIN 50 MG PO CAPS: 50.0000 mg | ORAL_CAPSULE | Freq: Three times a day (TID) | ORAL | 3 refills | Status: DC

## 2017-06-16 NOTE — Telephone Encounter (Signed)
Return [name] call, she has tolerated the Lyrica 50 mg TID, call placed to [facility], new order given. She verbalizes understanding.

## 2017-06-22 ENCOUNTER — Encounter: Payer: Medicare Other | Attending: Physical Medicine & Rehabilitation

## 2017-06-22 ENCOUNTER — Encounter: Payer: Self-pay | Admitting: Physical Medicine & Rehabilitation

## 2017-06-22 ENCOUNTER — Other Ambulatory Visit: Payer: Self-pay

## 2017-06-22 ENCOUNTER — Ambulatory Visit: Payer: Medicare Other | Admitting: Physical Medicine & Rehabilitation

## 2017-06-22 VITALS — BP 116/78 | HR 103 | Resp 14

## 2017-06-22 DIAGNOSIS — R000 Tachycardia, unspecified: Secondary | ICD-10-CM | POA: Insufficient documentation

## 2017-06-22 DIAGNOSIS — M4716 Other spondylosis with myelopathy, lumbar region: Secondary | ICD-10-CM | POA: Diagnosis not present

## 2017-06-22 DIAGNOSIS — F329 Major depressive disorder, single episode, unspecified: Secondary | ICD-10-CM | POA: Diagnosis not present

## 2017-06-22 DIAGNOSIS — Z5181 Encounter for therapeutic drug level monitoring: Secondary | ICD-10-CM | POA: Diagnosis not present

## 2017-06-22 DIAGNOSIS — F419 Anxiety disorder, unspecified: Secondary | ICD-10-CM | POA: Insufficient documentation

## 2017-06-22 DIAGNOSIS — K219 Gastro-esophageal reflux disease without esophagitis: Secondary | ICD-10-CM | POA: Insufficient documentation

## 2017-06-22 DIAGNOSIS — M545 Low back pain: Secondary | ICD-10-CM | POA: Insufficient documentation

## 2017-06-22 DIAGNOSIS — K648 Other hemorrhoids: Secondary | ICD-10-CM | POA: Insufficient documentation

## 2017-06-22 DIAGNOSIS — M5136 Other intervertebral disc degeneration, lumbar region: Secondary | ICD-10-CM | POA: Diagnosis not present

## 2017-06-22 DIAGNOSIS — R55 Syncope and collapse: Secondary | ICD-10-CM | POA: Insufficient documentation

## 2017-06-22 DIAGNOSIS — G894 Chronic pain syndrome: Secondary | ICD-10-CM

## 2017-06-22 DIAGNOSIS — K589 Irritable bowel syndrome without diarrhea: Secondary | ICD-10-CM | POA: Insufficient documentation

## 2017-06-22 DIAGNOSIS — R002 Palpitations: Secondary | ICD-10-CM | POA: Diagnosis not present

## 2017-06-22 DIAGNOSIS — M47892 Other spondylosis, cervical region: Secondary | ICD-10-CM | POA: Diagnosis not present

## 2017-06-22 DIAGNOSIS — Z79899 Other long term (current) drug therapy: Secondary | ICD-10-CM | POA: Diagnosis not present

## 2017-06-22 DIAGNOSIS — Z599 Problem related to housing and economic circumstances, unspecified: Secondary | ICD-10-CM | POA: Insufficient documentation

## 2017-06-22 DIAGNOSIS — K0889 Other specified disorders of teeth and supporting structures: Secondary | ICD-10-CM | POA: Diagnosis not present

## 2017-06-22 DIAGNOSIS — M542 Cervicalgia: Secondary | ICD-10-CM | POA: Diagnosis not present

## 2017-06-22 DIAGNOSIS — G8929 Other chronic pain: Secondary | ICD-10-CM | POA: Insufficient documentation

## 2017-06-22 DIAGNOSIS — Z86010 Personal history of colonic polyps: Secondary | ICD-10-CM | POA: Diagnosis not present

## 2017-06-22 DIAGNOSIS — M797 Fibromyalgia: Secondary | ICD-10-CM | POA: Insufficient documentation

## 2017-06-22 DIAGNOSIS — I951 Orthostatic hypotension: Secondary | ICD-10-CM | POA: Insufficient documentation

## 2017-06-22 DIAGNOSIS — K222 Esophageal obstruction: Secondary | ICD-10-CM | POA: Diagnosis not present

## 2017-06-22 DIAGNOSIS — R51 Headache: Secondary | ICD-10-CM | POA: Diagnosis not present

## 2017-06-22 DIAGNOSIS — Z760 Encounter for issue of repeat prescription: Secondary | ICD-10-CM | POA: Diagnosis not present

## 2017-06-22 DIAGNOSIS — Z87891 Personal history of nicotine dependence: Secondary | ICD-10-CM | POA: Insufficient documentation

## 2017-06-22 DIAGNOSIS — K449 Diaphragmatic hernia without obstruction or gangrene: Secondary | ICD-10-CM | POA: Insufficient documentation

## 2017-06-22 MED ORDER — HYDROCODONE-ACETAMINOPHEN 5-325 MG PO TABS: 2.0000 | ORAL_TABLET | Freq: Three times a day (TID) | ORAL | 0 refills | Status: DC | PRN

## 2017-06-22 MED ORDER — PREGABALIN 25 MG PO CAPS: 25.0000 mg | ORAL_CAPSULE | Freq: Two times a day (BID) | ORAL | 1 refills | Status: DC

## 2017-06-22 NOTE — Progress Notes (Signed)
Subjective:    Patient ID: [name], female    DOB: 10/10/1975, 40 y.o.   MRN: [id number]  HPI 41 year old female with chief complaint of widespread body pain.  She has complaints in her neck area her mid back her low back her elbows and her knees.  She is also concerned that she might have an abscess in her left lower jaw.  She has had no fever or chills.  She has an appointment with the dentist tomorrow. Her PCP question whether she may have sicca syndrome after she reported her eyes are dry and she has some mouth dryness.  No diagnostic testing was ordered.  Patient has not seen a rheumatologist.  Patient is concerned about this as a possible diagnosis.  She has had no joint swelling.  Pain Inventory Average Pain 8 Pain Right Now 8 My pain is intermittent, constant, sharp, burning, dull, stabbing, tingling and aching  In the last 24 hours, has pain interfered with the following? General activity 8 Relation with others 8 Enjoyment of life 8 What TIME of day is your pain at its worst? all Sleep (in general) Fair  Pain is worse with: walking, bending, sitting, inactivity, standing, unsure and some activites Pain improves with: rest, heat/ice, therapy/exercise, pacing activities, medication and TENS Relief from Meds: 6  Mobility walk without assistance how many minutes can you walk? 20 ability to climb steps?  yes do you drive?  yes Do you have any goals in this area?  yes  Function disabled: date disabled . I need assistance with the following:  meal prep, household duties and shopping  Neuro/Psych weakness numbness tremor tingling trouble walking spasms dizziness confusion depression anxiety  Prior Studies Any changes since last visit?  no  Physicians involved in your care Any changes since last visit?  no   Family History  Problem Relation Age of Onset  . Diabetes type II Mother   . Colon polyps Mother   . Diverticulitis Mother   . Diabetes Mother    . Heart disease Mother   . Osteoporosis Father   . Diabetes type II Brother   . Uterine cancer Unknown   . Ovarian cancer Unknown   . COPD Paternal Grandfather   . Heart attack Maternal Grandmother    Social History   Socioeconomic History  . Marital status: Married    Spouse name: None  . Number of children: 2  . Years of education: None  . Highest education level: None  Social Needs  . Financial resource strain: None  . Food insecurity - worry: None  . Food insecurity - inability: None  . Transportation needs - medical: None  . Transportation needs - non-medical: None  Occupational History  . Occupation: Disability    Employer: UNEMPLOYED  Tobacco Use  . Smoking status: Current Every Day Smoker    Packs/day: 0.50    Types: Cigarettes    Last attempt to quit: 06/30/2011    Years since quitting: 5.9  . Smokeless tobacco: Never Used  Substance and Sexual Activity  . Alcohol use: No    Alcohol/week: 0.0 oz  . Drug use: No  . Sexual activity: Yes  Other Topics Concern  . None  Social History Narrative  . None   Past Surgical History:  Procedure Laterality Date  . CESAREAN SECTION    . Left elbow surgery    . PARTIAL HYSTERECTOMY    . TONSILLECTOMY     Past Medical History:  Diagnosis Date  .  Anxiety   . Chronic pain    Dr. Letta Pate  . Degenerative disc disease    Spinal, small syrinx  . Depression   . Esophageal stricture    Status post dilatation  . Fibromyalgia   . GERD (gastroesophageal reflux disease)   . Hiatal hernia   . Internal hemorrhoids without mention of complication   . Irritable bowel syndrome   . Palpitations    No documented arrhythmias  . Personal history of colonic polyps 10/04/2000   HYPERPLASTIC POLYP  . POTS (postural orthostatic tachycardia syndrome)   . Syncope    Negative tilt table test  . Syringomyelia and syringobulbia (HCC)    BP 116/78 (BP Location: Right Arm, Patient Position: Sitting, Cuff Size: Normal)   Pulse  (!) 103   Resp 14   SpO2 94%   Opioid Risk Score:   Fall Risk Score:  `1  Depression screen PHQ 2/9  Depression screen [facility] 2/9 05/24/2017 05/26/2016 02/24/2016 04/23/2015 03/26/2015 02/22/2015 10/22/2014  Decreased Interest 1 1 1 1 1 1 2   Down, Depressed, Hopeless 1 1 1 1 1 1 3   PHQ - 2 Score 2 2 2 2 2 2 5   Altered sleeping - - - - - - 2  Tired, decreased energy - - - - - - 3  Change in appetite - - - - - - 0  Feeling bad or failure about yourself  - - - - - - 2  Trouble concentrating - - - - - - 1  Moving slowly or fidgety/restless - - - - - - 2  Suicidal thoughts - - - - - - 0  PHQ-9 Score - - - - - - 15  ]  Review of Systems  HENT: Negative.   Eyes: Negative.   Respiratory: Negative.   Cardiovascular: Negative.   Gastrointestinal: Positive for constipation.  Endocrine: Negative.   Genitourinary: Negative.   Musculoskeletal: Positive for arthralgias, back pain, gait problem, myalgias and neck pain.       Spasms   Skin: Positive for rash.  Allergic/Immunologic: Negative.   Neurological: Positive for tremors, weakness and numbness.       Tingling   Hematological: Negative.   Psychiatric/Behavioral: Positive for confusion and dysphoric mood. The patient is nervous/anxious.   All other systems reviewed and are negative.      Objective:   Physical Exam Patient has tenderness palpation bilateral upper trapezius low back area bilateral greater trochanter of the hip, bilateral elbows and bilateral knees. Motor strength 5/5 bilateral deltoid bicep tricep grip hip flexion knee extension ankle dorsiflexion plantarflexion Extremities no clubbing cyanosis or edema Oral cavity no evidence of swelling along the lower jaw. Neck has no evidence of cervical adenopathy on the left side.      Assessment & Plan:  1.  Fibromyalgia syndrome with widespread body pain.  She is getting some relief from Lyrica 50 mg 3 times daily I think is reasonable to go up to 75 mg at least for the 2  later doses in the day.  Therefore she will take 50 mg in the morning 75 in the afternoon and 75 in the evening.  As discussed with patient to avoid polypharmacy will reduce her hydrocodone to 5 mg 2 tablets 3 times daily. We reviewed the PMP aware website we discussed her sedative dosing.  She states that she only takes her Klonopin 2 mg once a day.  I discussed that her sedative risk is based on 2 tablets times a  day and that she should tell her primary care physician that she does not need so much Klonopin.  Continue opioid monitoring program. This consists of regular clinic visits, examinations, urine drug screen, pill counts as well as use of [place] controlled substance reporting System. Nurse practitioner follow-up in 1 month

## 2017-06-22 NOTE — Patient Instructions (Addendum)
Take an additional 28m of Lyrical in the evening and at night

## 2017-06-28 LAB — DRUG TOX MONITOR 1 W/CONF, ORAL FLD
ALPRAZOLAM: NEGATIVE ng/mL (ref <0.50)
Amobarbital: NEGATIVE ng/mL (ref <10)
Amphetamine: >500 ng/mL — ABNORMAL HIGH (ref <10)
Amphetamines: POSITIVE ng/mL — AB (ref <10)
BARBITURATES: POSITIVE ng/mL — AB (ref <10)
BUTALBITAL: 465 ng/mL — AB (ref <10)
Benzodiazepines: POSITIVE ng/mL — AB (ref <0.50)
Buprenorphine: NEGATIVE ng/mL (ref <0.10)
Chlordiazepoxide: NEGATIVE ng/mL (ref <0.50)
Clonazepam: 3.2 ng/mL — ABNORMAL HIGH (ref <0.50)
Cocaine: NEGATIVE ng/mL (ref <5.0)
Codeine: NEGATIVE ng/mL (ref <2.5)
Cotinine: 43.1 ng/mL — ABNORMAL HIGH (ref <5.0)
DIHYDROCODEINE: 13.8 ng/mL — AB (ref <2.5)
Diazepam: NEGATIVE ng/mL (ref <0.50)
FENTANYL: NEGATIVE ng/mL (ref <0.10)
FLUNITRAZEPAM: NEGATIVE ng/mL (ref <0.50)
FLURAZEPAM: NEGATIVE ng/mL (ref <0.50)
HEROIN METABOLITE: NEGATIVE ng/mL (ref <1.0)
Hydromorphone: NEGATIVE ng/mL (ref <2.5)
Lorazepam: NEGATIVE ng/mL (ref <0.50)
MARIJUANA: NEGATIVE ng/mL (ref <2.5)
MDMA: NEGATIVE ng/mL (ref <10)
MEPROBAMATE: NEGATIVE ng/mL (ref <2.5)
MIDAZOLAM: NEGATIVE ng/mL (ref <0.50)
MORPHINE: NEGATIVE ng/mL (ref <2.5)
Methadone: NEGATIVE ng/mL (ref <5.0)
Methamphetamine: NEGATIVE ng/mL (ref <10)
NORDIAZEPAM: NEGATIVE ng/mL (ref <0.50)
NORHYDROCODONE: 61.9 ng/mL — AB (ref <2.5)
Nicotine Metabolite: POSITIVE ng/mL — AB (ref <5.0)
Noroxycodone: NEGATIVE ng/mL (ref <2.5)
Opiates: POSITIVE ng/mL — AB (ref <2.5)
Oxazepam: NEGATIVE ng/mL (ref <0.50)
Oxycodone: NEGATIVE ng/mL (ref <2.5)
Oxymorphone: NEGATIVE ng/mL (ref <2.5)
PENTOBARBITAL: NEGATIVE ng/mL (ref <10)
Phencyclidine: NEGATIVE ng/mL (ref <10)
Phenobarbital: NEGATIVE ng/mL (ref <10)
Secobarbital: NEGATIVE ng/mL (ref <10)
TRAMADOL: NEGATIVE ng/mL (ref <5.0)
Tapentadol: NEGATIVE ng/mL (ref <5.0)
Temazepam: NEGATIVE ng/mL (ref <0.50)
Triazolam: NEGATIVE ng/mL (ref <0.50)
Zolpidem: NEGATIVE ng/mL (ref <5.0)

## 2017-06-28 LAB — DRUG TOX ALC METAB W/CON, ORAL FLD: ALCOHOL METABOLITE: NEGATIVE ng/mL (ref <25)

## 2017-07-22 ENCOUNTER — Encounter: Payer: Self-pay | Admitting: Registered Nurse

## 2017-07-22 ENCOUNTER — Other Ambulatory Visit: Payer: Self-pay

## 2017-07-22 ENCOUNTER — Encounter: Payer: Medicare Other | Attending: Physical Medicine & Rehabilitation | Admitting: Registered Nurse

## 2017-07-22 VITALS — BP 121/76 | HR 73

## 2017-07-22 DIAGNOSIS — M542 Cervicalgia: Secondary | ICD-10-CM

## 2017-07-22 DIAGNOSIS — K222 Esophageal obstruction: Secondary | ICD-10-CM | POA: Diagnosis not present

## 2017-07-22 DIAGNOSIS — R51 Headache: Secondary | ICD-10-CM | POA: Diagnosis not present

## 2017-07-22 DIAGNOSIS — F329 Major depressive disorder, single episode, unspecified: Secondary | ICD-10-CM | POA: Diagnosis not present

## 2017-07-22 DIAGNOSIS — M5136 Other intervertebral disc degeneration, lumbar region: Secondary | ICD-10-CM | POA: Insufficient documentation

## 2017-07-22 DIAGNOSIS — K589 Irritable bowel syndrome without diarrhea: Secondary | ICD-10-CM | POA: Diagnosis not present

## 2017-07-22 DIAGNOSIS — K0889 Other specified disorders of teeth and supporting structures: Secondary | ICD-10-CM | POA: Diagnosis not present

## 2017-07-22 DIAGNOSIS — M2550 Pain in unspecified joint: Secondary | ICD-10-CM

## 2017-07-22 DIAGNOSIS — G44229 Chronic tension-type headache, not intractable: Secondary | ICD-10-CM | POA: Diagnosis not present

## 2017-07-22 DIAGNOSIS — Z599 Problem related to housing and economic circumstances, unspecified: Secondary | ICD-10-CM | POA: Diagnosis not present

## 2017-07-22 DIAGNOSIS — Z5181 Encounter for therapeutic drug level monitoring: Secondary | ICD-10-CM | POA: Diagnosis not present

## 2017-07-22 DIAGNOSIS — K648 Other hemorrhoids: Secondary | ICD-10-CM | POA: Diagnosis not present

## 2017-07-22 DIAGNOSIS — R000 Tachycardia, unspecified: Secondary | ICD-10-CM | POA: Diagnosis not present

## 2017-07-22 DIAGNOSIS — I951 Orthostatic hypotension: Secondary | ICD-10-CM | POA: Insufficient documentation

## 2017-07-22 DIAGNOSIS — K449 Diaphragmatic hernia without obstruction or gangrene: Secondary | ICD-10-CM | POA: Diagnosis not present

## 2017-07-22 DIAGNOSIS — Z760 Encounter for issue of repeat prescription: Secondary | ICD-10-CM | POA: Diagnosis not present

## 2017-07-22 DIAGNOSIS — Z87891 Personal history of nicotine dependence: Secondary | ICD-10-CM | POA: Insufficient documentation

## 2017-07-22 DIAGNOSIS — F419 Anxiety disorder, unspecified: Secondary | ICD-10-CM | POA: Insufficient documentation

## 2017-07-22 DIAGNOSIS — M47812 Spondylosis without myelopathy or radiculopathy, cervical region: Secondary | ICD-10-CM | POA: Diagnosis not present

## 2017-07-22 DIAGNOSIS — M47892 Other spondylosis, cervical region: Secondary | ICD-10-CM | POA: Insufficient documentation

## 2017-07-22 DIAGNOSIS — R55 Syncope and collapse: Secondary | ICD-10-CM | POA: Insufficient documentation

## 2017-07-22 DIAGNOSIS — M797 Fibromyalgia: Secondary | ICD-10-CM

## 2017-07-22 DIAGNOSIS — K219 Gastro-esophageal reflux disease without esophagitis: Secondary | ICD-10-CM | POA: Insufficient documentation

## 2017-07-22 DIAGNOSIS — M47814 Spondylosis without myelopathy or radiculopathy, thoracic region: Secondary | ICD-10-CM

## 2017-07-22 DIAGNOSIS — G8929 Other chronic pain: Secondary | ICD-10-CM | POA: Diagnosis not present

## 2017-07-22 DIAGNOSIS — M545 Low back pain: Secondary | ICD-10-CM | POA: Insufficient documentation

## 2017-07-22 DIAGNOSIS — R002 Palpitations: Secondary | ICD-10-CM | POA: Diagnosis not present

## 2017-07-22 DIAGNOSIS — M4716 Other spondylosis with myelopathy, lumbar region: Secondary | ICD-10-CM

## 2017-07-22 DIAGNOSIS — Z79899 Other long term (current) drug therapy: Secondary | ICD-10-CM

## 2017-07-22 DIAGNOSIS — Z86010 Personal history of colonic polyps: Secondary | ICD-10-CM | POA: Diagnosis not present

## 2017-07-22 DIAGNOSIS — G894 Chronic pain syndrome: Secondary | ICD-10-CM

## 2017-07-22 MED ORDER — HYDROCODONE-ACETAMINOPHEN 5-325 MG PO TABS: 2.0000 | ORAL_TABLET | Freq: Three times a day (TID) | ORAL | 0 refills | Status: DC | PRN

## 2017-07-22 MED ORDER — MELOXICAM 7.5 MG PO TABS: 7.5000 mg | ORAL_TABLET | Freq: Every day | ORAL | 3 refills | Status: DC

## 2017-07-22 NOTE — Progress Notes (Signed)
Subjective:    Patient ID: [name], female    DOB: 16-Jun-1976, 41 y.o.   MRN: [id number]  HPI: [name] is a 41year old female who returns for follow up appointmentfor chronic pain and medication refill. She states her pain is located in her neck radiating into her bilateral shoulders, right elbow and mid-lower back. Also reports joint pain all over we will prescribe Meloxicam, she verbalizes understanding. Instructed to call office in two weeks to evaluate medication changes.  .[name] tolerating [place] we will continue with current dose, she reports with the decrease in the Hydrocodone she has noticed increase intensity of pain, she has decribe joint pain mostly and body aches we will add meloxicam and re-evaluate next month, she verbalizes understanding. She rates her pain 6.Her current exercise regime is walking and performing stretching exercises occassionally.   [name] Morphine equivalent is  30.00 MME.  She is also prescribed Klonopin   by Dr. Gerarda Fraction .We have reviewed the black box warning regarding using opioids and benzodiazepines.I highlighted the dangers of using these drugs together and discussed the adverse events including respiratory suppression, overdose, cognitive impairment and importance of  compliance with current regimen. She verbalizes understanding, we will continue to monitor and adjust as indicated.    Last UDS was performed on 01/04/2017, it was consistent.    Pain Inventory Average Pain 7 Pain Right Now 6 My pain is intermittent, constant, sharp, burning, dull, stabbing, tingling and aching  In the last 24 hours, has pain interfered with the following? General activity 8 Relation with others 8 Enjoyment of life 7 What TIME of day is your pain at its worst? morning and night, evening, night Sleep (in general) Fair Pain is worse with: walking, bending, sitting, inactivity, standing, unsure and some activites Pain improves with: rest,  heat/ice, medication and TENS Relief from Meds: 5  Mobility walk without assistance how many minutes can you walk? 10 ability to climb steps?  yes do you drive?  yes Do you have any goals in this area?  yes  Function disabled: date disabled . I need assistance with the following:  meal prep, household duties and shopping  Neuro/Psych weakness numbness tremor tingling spasms dizziness confusion  Prior Studies Any changes since last visit?  no  Physicians involved in your care Any changes since last visit?  no   Family History  Problem Relation Age of Onset  . Diabetes type II Mother   . Colon polyps Mother   . Diverticulitis Mother   . Diabetes Mother   . Heart disease Mother   . Osteoporosis Father   . Diabetes type II Brother   . Uterine cancer Unknown   . Ovarian cancer Unknown   . COPD Paternal Grandfather   . Heart attack Maternal Grandmother    Social History   Socioeconomic History  . Marital status: Married    Spouse name: None  . Number of children: 2  . Years of education: None  . Highest education level: None  Social Needs  . Financial resource strain: None  . Food insecurity - worry: None  . Food insecurity - inability: None  . Transportation needs - medical: None  . Transportation needs - non-medical: None  Occupational History  . Occupation: Disability    Employer: UNEMPLOYED  Tobacco Use  . Smoking status: Current Every Day Smoker    Packs/day: 0.50    Types: Cigarettes    Last attempt to quit: 06/30/2011  Years since quitting: 6.0  . Smokeless tobacco: Never Used  Substance and Sexual Activity  . Alcohol use: No    Alcohol/week: 0.0 oz  . Drug use: No  . Sexual activity: Yes  Other Topics Concern  . None  Social History Narrative  . None   Past Surgical History:  Procedure Laterality Date  . CESAREAN SECTION    . Left elbow surgery    . PARTIAL HYSTERECTOMY    . TONSILLECTOMY     Past Medical History:  Diagnosis  Date  . Anxiety   . Chronic pain    Dr. Letta Pate  . Degenerative disc disease    Spinal, small syrinx  . Depression   . Esophageal stricture    Status post dilatation  . Fibromyalgia   . GERD (gastroesophageal reflux disease)   . Hiatal hernia   . Internal hemorrhoids without mention of complication   . Irritable bowel syndrome   . Palpitations    No documented arrhythmias  . Personal history of colonic polyps 10/04/2000   HYPERPLASTIC POLYP  . POTS (postural orthostatic tachycardia syndrome)   . Syncope    Negative tilt table test  . Syringomyelia and syringobulbia (HCC)    BP 121/76   Pulse 73   SpO2 95%   Opioid Risk Score:  4 Fall Risk Score:  `1  Depression screen PHQ 2/9  Depression screen [facility] 2/9 07/22/2017 05/24/2017 05/26/2016 02/24/2016 04/23/2015 03/26/2015 02/22/2015  Decreased Interest 1 1 1 1 1 1 1   Down, Depressed, Hopeless 1 1 1 1 1 1 1   PHQ - 2 Score 2 2 2 2 2 2 2   Altered sleeping - - - - - - -  Tired, decreased energy - - - - - - -  Change in appetite - - - - - - -  Feeling bad or failure about yourself  - - - - - - -  Trouble concentrating - - - - - - -  Moving slowly or fidgety/restless - - - - - - -  Suicidal thoughts - - - - - - -  PHQ-9 Score - - - - - - -    Review of Systems  Constitutional: Positive for diaphoresis.  HENT: Negative.   Eyes: Negative.   Respiratory: Negative.   Cardiovascular: Negative.   Gastrointestinal: Negative.   Endocrine: Negative.   Genitourinary: Negative.   Musculoskeletal: Positive for arthralgias, back pain, myalgias and neck pain.  Skin: Negative.   Allergic/Immunologic: Negative.   Neurological: Positive for tremors, weakness, numbness and headaches.       Tingling  Hematological: Negative.   Psychiatric/Behavioral: Positive for confusion and dysphoric mood. The patient is nervous/anxious.   All other systems reviewed and are negative.      Objective:   Physical Exam  Constitutional: She is  oriented to person, place, and time. She appears well-developed and well-nourished.  HENT:  Head: Normocephalic and atraumatic.  Neck: Normal range of motion. Neck supple.  Cervical Paraspinal Tenderness: C-5-C-6  Cardiovascular: Normal rate and regular rhythm.  Pulmonary/Chest: Effort normal and breath sounds normal.  Musculoskeletal:  Normal Muscle Bulk and Muscle Testing Reveals: Upper Extremities: Full ROM and Muscle Strength 5/5 Lumbar Paraspinal Tenderness: L-3-L-5 Lower Extremities: Full ROM and Muscle Strength 5/5 Arises from chair with ease Narrow Based Gait  Neurological: She is alert and oriented to person, place, and time.  Skin: Skin is warm and dry.  Psychiatric: She has a normal mood and affect.  Nursing note and  vitals reviewed.         Assessment & Plan:  1. Lumbar degenerative disc disease: 07/22/2017 Refilled: HYDROcodone 5/325 mg two tablets every 8 hours as needed #180. We will continue the opioid monitoring program, this consists of regular clinic visits, examinations, urine drug screen, pill counts as well as use of [place] Controlled Substance Reporting System.  2. Thoracic Spondylosis: Continue HEP and  current medication regime. 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 Continue with exercise and heat therapy. 07/22/2017 4. Fibromyalgia:  Lyrica 50 mg in the morning and 75 mg in the afternoon and evening.Continue with Heat and exercise therapy. 07/22/2017 5.Depression and anxiety: PCP Following. 07/22/2017 6.Orthostatic hypotension: Cardiology Following. 07/22/2017 7. Polyarthralgia: RX: Meloxicam   20 minutes of face to face patient care time was spent during this visit. All questions were encouraged and answered.  F/U in 1 month

## 2017-08-05 ENCOUNTER — Telehealth: Payer: Self-pay | Admitting: *Deleted

## 2017-08-05 NOTE — Telephone Encounter (Signed)
Oral swab drug screen was consistent for prescribed medications.  ?

## 2017-08-18 DIAGNOSIS — Z0001 Encounter for general adult medical examination with abnormal findings: Secondary | ICD-10-CM | POA: Diagnosis not present

## 2017-08-18 DIAGNOSIS — Z6822 Body mass index (BMI) 22.0-22.9, adult: Secondary | ICD-10-CM | POA: Diagnosis not present

## 2017-08-23 ENCOUNTER — Encounter: Payer: Medicare Other | Attending: Physical Medicine & Rehabilitation | Admitting: Registered Nurse

## 2017-08-23 ENCOUNTER — Encounter: Payer: Self-pay | Admitting: Registered Nurse

## 2017-08-23 VITALS — BP 120/76 | HR 96

## 2017-08-23 DIAGNOSIS — Z87891 Personal history of nicotine dependence: Secondary | ICD-10-CM | POA: Diagnosis not present

## 2017-08-23 DIAGNOSIS — R002 Palpitations: Secondary | ICD-10-CM | POA: Insufficient documentation

## 2017-08-23 DIAGNOSIS — R55 Syncope and collapse: Secondary | ICD-10-CM | POA: Insufficient documentation

## 2017-08-23 DIAGNOSIS — M25512 Pain in left shoulder: Secondary | ICD-10-CM

## 2017-08-23 DIAGNOSIS — F419 Anxiety disorder, unspecified: Secondary | ICD-10-CM | POA: Diagnosis not present

## 2017-08-23 DIAGNOSIS — Z599 Problem related to housing and economic circumstances, unspecified: Secondary | ICD-10-CM | POA: Diagnosis not present

## 2017-08-23 DIAGNOSIS — K648 Other hemorrhoids: Secondary | ICD-10-CM | POA: Insufficient documentation

## 2017-08-23 DIAGNOSIS — M25511 Pain in right shoulder: Secondary | ICD-10-CM | POA: Diagnosis not present

## 2017-08-23 DIAGNOSIS — K219 Gastro-esophageal reflux disease without esophagitis: Secondary | ICD-10-CM | POA: Insufficient documentation

## 2017-08-23 DIAGNOSIS — M2550 Pain in unspecified joint: Secondary | ICD-10-CM

## 2017-08-23 DIAGNOSIS — R51 Headache: Secondary | ICD-10-CM | POA: Insufficient documentation

## 2017-08-23 DIAGNOSIS — G894 Chronic pain syndrome: Secondary | ICD-10-CM

## 2017-08-23 DIAGNOSIS — Z5181 Encounter for therapeutic drug level monitoring: Secondary | ICD-10-CM

## 2017-08-23 DIAGNOSIS — F329 Major depressive disorder, single episode, unspecified: Secondary | ICD-10-CM | POA: Insufficient documentation

## 2017-08-23 DIAGNOSIS — I951 Orthostatic hypotension: Secondary | ICD-10-CM | POA: Insufficient documentation

## 2017-08-23 DIAGNOSIS — Z86010 Personal history of colonic polyps: Secondary | ICD-10-CM | POA: Insufficient documentation

## 2017-08-23 DIAGNOSIS — M797 Fibromyalgia: Secondary | ICD-10-CM | POA: Diagnosis not present

## 2017-08-23 DIAGNOSIS — M5136 Other intervertebral disc degeneration, lumbar region: Secondary | ICD-10-CM | POA: Diagnosis not present

## 2017-08-23 DIAGNOSIS — M545 Low back pain: Secondary | ICD-10-CM | POA: Insufficient documentation

## 2017-08-23 DIAGNOSIS — K0889 Other specified disorders of teeth and supporting structures: Secondary | ICD-10-CM | POA: Diagnosis not present

## 2017-08-23 DIAGNOSIS — G8929 Other chronic pain: Secondary | ICD-10-CM

## 2017-08-23 DIAGNOSIS — K449 Diaphragmatic hernia without obstruction or gangrene: Secondary | ICD-10-CM | POA: Insufficient documentation

## 2017-08-23 DIAGNOSIS — K222 Esophageal obstruction: Secondary | ICD-10-CM | POA: Diagnosis not present

## 2017-08-23 DIAGNOSIS — K589 Irritable bowel syndrome without diarrhea: Secondary | ICD-10-CM | POA: Insufficient documentation

## 2017-08-23 DIAGNOSIS — Z79899 Other long term (current) drug therapy: Secondary | ICD-10-CM | POA: Diagnosis not present

## 2017-08-23 DIAGNOSIS — R000 Tachycardia, unspecified: Secondary | ICD-10-CM | POA: Insufficient documentation

## 2017-08-23 DIAGNOSIS — M5412 Radiculopathy, cervical region: Secondary | ICD-10-CM | POA: Diagnosis not present

## 2017-08-23 DIAGNOSIS — Z760 Encounter for issue of repeat prescription: Secondary | ICD-10-CM | POA: Insufficient documentation

## 2017-08-23 DIAGNOSIS — M542 Cervicalgia: Secondary | ICD-10-CM | POA: Diagnosis not present

## 2017-08-23 DIAGNOSIS — M47814 Spondylosis without myelopathy or radiculopathy, thoracic region: Secondary | ICD-10-CM

## 2017-08-23 DIAGNOSIS — M47812 Spondylosis without myelopathy or radiculopathy, cervical region: Secondary | ICD-10-CM | POA: Diagnosis not present

## 2017-08-23 DIAGNOSIS — M47892 Other spondylosis, cervical region: Secondary | ICD-10-CM | POA: Insufficient documentation

## 2017-08-23 MED ORDER — HYDROCODONE-ACETAMINOPHEN 5-325 MG PO TABS: 2.0000 | ORAL_TABLET | Freq: Three times a day (TID) | ORAL | 0 refills | Status: DC | PRN

## 2017-08-23 NOTE — Patient Instructions (Addendum)
Please call office to make sure you have Lyrica 50 mg  and 25 mg capsules and call office with the count .   Starting tonight Take Lyrica 50 mg Capsules   Also call office in 2 weeks to evaluate medication change, if you develop any side effects call office  [phone]

## 2017-08-23 NOTE — Progress Notes (Signed)
Subjective:    Patient ID: [name], female    DOB: Apr 15, 1976, 42 y.o.   MRN: [id number]  HPI: [name] is a 42year old female who returns for follow up appointmentfor chronic pain and medication refill. She states her pain is located in her neck radiating into her bilateral shoulders, lower back pain and right hip pain. Also reports she has generalized joint pain. She rates her pain 7. Her current exercise regime is walking.   .Ms. Laird reports she has been experiencing different mood changes and weight gain with increase dose of Lyrica, we will decrease her Lyrica dose, she verbalizes understanding, denies any suicidal thoughts.   Ms. Hutmacher Morphine equivalent is  33.00 MME.  She is also prescribed Klonopin   by Dr. Gerarda Fraction .We have reviewed the black box warning again regarding using opioids and benzodiazepines.I highlighted the dangers of using these drugs together and discussed the adverse events including respiratory suppression, overdose, cognitive impairment and importance of  compliance with current regimen. She verbalizes understanding, we will continue to monitor and adjust as indicated.    Last UDS was performed on 01/04/2017, it was consistent.    Pain Inventory Average Pain 7 Pain Right Now 7 My pain is intermittent, constant, sharp, burning, dull, stabbing, tingling and aching  In the last 24 hours, has pain interfered with the following? General activity 8 Relation with others 8 Enjoyment of life 7 What TIME of day is your pain at its worst? morning and night, evening, night Sleep (in general) Fair Pain is worse with: walking, bending, sitting, inactivity, standing, unsure and some activites Pain improves with: rest, heat/ice, medication and TENS Relief from Meds: 6  Mobility walk without assistance how many minutes can you walk? 10 ability to climb steps?  yes do you drive?  yes Do you have any goals in this area?   yes  Function disabled: date disabled . I need assistance with the following:  meal prep, household duties and shopping  Neuro/Psych weakness numbness tremor tingling spasms dizziness confusion  Prior Studies Any changes since last visit?  no  Physicians involved in your care Any changes since last visit?  no   Family History  Problem Relation Age of Onset  . Diabetes type II Mother   . Colon polyps Mother   . Diverticulitis Mother   . Diabetes Mother   . Heart disease Mother   . Osteoporosis Father   . Diabetes type II Brother   . Uterine cancer Unknown   . Ovarian cancer Unknown   . COPD Paternal Grandfather   . Heart attack Maternal Grandmother    Social History   Socioeconomic History  . Marital status: Married    Spouse name: None  . Number of children: 2  . Years of education: None  . Highest education level: None  Social Needs  . Financial resource strain: None  . Food insecurity - worry: None  . Food insecurity - inability: None  . Transportation needs - medical: None  . Transportation needs - non-medical: None  Occupational History  . Occupation: Disability    Employer: UNEMPLOYED  Tobacco Use  . Smoking status: Former Smoker    Types: Cigarettes    Last attempt to quit: 06/30/2011    Years since quitting: 6.1  . Smokeless tobacco: Never Used  Substance and Sexual Activity  . Alcohol use: No    Alcohol/week: 0.0 oz  . Drug use: No  . Sexual activity: Yes  Other Topics Concern  . None  Social History Narrative  . None   Past Surgical History:  Procedure Laterality Date  . CESAREAN SECTION    . Left elbow surgery    . PARTIAL HYSTERECTOMY    . TONSILLECTOMY     Past Medical History:  Diagnosis Date  . Anxiety   . Chronic pain    Dr. Letta Pate  . Degenerative disc disease    Spinal, small syrinx  . Depression   . Esophageal stricture    Status post dilatation  . Fibromyalgia   . GERD (gastroesophageal reflux disease)   .  Hiatal hernia   . Internal hemorrhoids without mention of complication   . Irritable bowel syndrome   . Palpitations    No documented arrhythmias  . Personal history of colonic polyps 10/04/2000   HYPERPLASTIC POLYP  . POTS (postural orthostatic tachycardia syndrome)   . Syncope    Negative tilt table test  . Syringomyelia and syringobulbia ([place])    There were no vitals taken for this visit.  Opioid Risk Score:  4 Fall Risk Score:  `1  Depression screen PHQ 2/9  Depression screen [facility] 2/9 07/22/2017 05/24/2017 05/26/2016 02/24/2016 04/23/2015 03/26/2015 02/22/2015  Decreased Interest 1 1 1 1 1 1 1   Down, Depressed, Hopeless 1 1 1 1 1 1 1   PHQ - 2 Score 2 2 2 2 2 2 2   Altered sleeping - - - - - - -  Tired, decreased energy - - - - - - -  Change in appetite - - - - - - -  Feeling bad or failure about yourself  - - - - - - -  Trouble concentrating - - - - - - -  Moving slowly or fidgety/restless - - - - - - -  Suicidal thoughts - - - - - - -  PHQ-9 Score - - - - - - -    Review of Systems  Constitutional: Positive for unexpected weight change.  HENT: Negative.   Eyes: Negative.   Respiratory: Negative.   Cardiovascular: Negative.   Gastrointestinal: Negative.   Endocrine: Negative.   Genitourinary: Negative.   Musculoskeletal: Positive for arthralgias, back pain, myalgias and neck pain.  Skin: Negative.   Allergic/Immunologic: Negative.   Neurological: Positive for tremors, weakness, numbness and headaches.       Tingling  Hematological: Negative.   Psychiatric/Behavioral: Positive for confusion and dysphoric mood. The patient is nervous/anxious.   All other systems reviewed and are negative.      Objective:   Physical Exam  Constitutional: She is oriented to person, place, and time. She appears well-developed and well-nourished.  HENT:  Head: Normocephalic and atraumatic.  Neck: Normal range of motion. Neck supple.  Cervical Paraspinal Tenderness: C-5-C-6   Cardiovascular: Normal rate and regular rhythm.  Pulmonary/Chest: Effort normal and breath sounds normal.  Musculoskeletal:  Normal Muscle Bulk and Muscle Testing Reveals: Upper Extremities: Full ROM and Muscle Strength 5/5 Back without spinal Tenderness Noted Lower Extremities: Full ROM and Muscle Strength 5/5 Arises from chair with ease Narrow Based Gait  Neurological: She is alert and oriented to person, place, and time.  Skin: Skin is warm and dry.  Psychiatric: She has a normal mood and affect.  Nursing note and vitals reviewed.         Assessment & Plan:  1. Lumbar degenerative disc disease: 08/23/2017 Refilled: HYDROcodone 5/325 mg two tablets every 8 hours as needed #180. We will continue the opioid  monitoring program, this consists of regular clinic visits, examinations, urine drug screen, pill counts as well as use of [place] Controlled Substance Reporting System.  2. Thoracic Spondylosis: Continue HEP and  current medication regime. 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 Continue with exercise and heat therapy. 08/23/2017 4. Fibromyalgia: Reduced:  Lyrica 50 mg BID. Continue with Heat and exercise therapy. 08/23/2017 5.Depression and anxiety: PCP Following. 08/23/2017 6.Orthostatic hypotension: Cardiology Following. 08/23/2017 7. Polyarthralgia: Continue Meloxicam  8. Chronic Shoulder Pain: Continue current medication regime. Continue HEP as Tolerated. 08/23/2017.  20 minutes of face to face patient care time was spent during this visit. All questions were encouraged and answered.  F/U in 1 month

## 2017-08-24 ENCOUNTER — Telehealth: Payer: Self-pay

## 2017-08-24 MED ORDER — PREGABALIN 50 MG PO CAPS: 50.0000 mg | ORAL_CAPSULE | Freq: Two times a day (BID) | ORAL | 2 refills | Status: DC

## 2017-08-24 NOTE — Telephone Encounter (Signed)
Return [name] call we will call in Lyrica 50 mg BID,when she was taking Lyrica 75 mg she developed side effects with mood and weight gain. She verbalizes understanding.

## 2017-08-24 NOTE — Telephone Encounter (Signed)
Patient called wanting [name] to know that she has 10 of the 60m Lyrica left.

## 2017-09-20 ENCOUNTER — Encounter: Payer: Medicare Other | Attending: Physical Medicine & Rehabilitation | Admitting: Registered Nurse

## 2017-09-20 ENCOUNTER — Other Ambulatory Visit: Payer: Self-pay

## 2017-09-20 ENCOUNTER — Encounter: Payer: Self-pay | Admitting: Registered Nurse

## 2017-09-20 VITALS — BP 134/93 | HR 100

## 2017-09-20 DIAGNOSIS — Z599 Problem related to housing and economic circumstances, unspecified: Secondary | ICD-10-CM | POA: Insufficient documentation

## 2017-09-20 DIAGNOSIS — M47892 Other spondylosis, cervical region: Secondary | ICD-10-CM | POA: Diagnosis not present

## 2017-09-20 DIAGNOSIS — K219 Gastro-esophageal reflux disease without esophagitis: Secondary | ICD-10-CM | POA: Diagnosis not present

## 2017-09-20 DIAGNOSIS — G894 Chronic pain syndrome: Secondary | ICD-10-CM

## 2017-09-20 DIAGNOSIS — M25512 Pain in left shoulder: Secondary | ICD-10-CM | POA: Diagnosis not present

## 2017-09-20 DIAGNOSIS — Z79899 Other long term (current) drug therapy: Secondary | ICD-10-CM

## 2017-09-20 DIAGNOSIS — G8929 Other chronic pain: Secondary | ICD-10-CM | POA: Diagnosis not present

## 2017-09-20 DIAGNOSIS — M47812 Spondylosis without myelopathy or radiculopathy, cervical region: Secondary | ICD-10-CM

## 2017-09-20 DIAGNOSIS — M542 Cervicalgia: Secondary | ICD-10-CM

## 2017-09-20 DIAGNOSIS — M2550 Pain in unspecified joint: Secondary | ICD-10-CM | POA: Diagnosis not present

## 2017-09-20 DIAGNOSIS — R000 Tachycardia, unspecified: Secondary | ICD-10-CM | POA: Diagnosis not present

## 2017-09-20 DIAGNOSIS — M5136 Other intervertebral disc degeneration, lumbar region: Secondary | ICD-10-CM | POA: Insufficient documentation

## 2017-09-20 DIAGNOSIS — I951 Orthostatic hypotension: Secondary | ICD-10-CM | POA: Insufficient documentation

## 2017-09-20 DIAGNOSIS — F329 Major depressive disorder, single episode, unspecified: Secondary | ICD-10-CM | POA: Diagnosis not present

## 2017-09-20 DIAGNOSIS — Z5181 Encounter for therapeutic drug level monitoring: Secondary | ICD-10-CM | POA: Diagnosis not present

## 2017-09-20 DIAGNOSIS — M25511 Pain in right shoulder: Secondary | ICD-10-CM | POA: Diagnosis not present

## 2017-09-20 DIAGNOSIS — Z87891 Personal history of nicotine dependence: Secondary | ICD-10-CM | POA: Diagnosis not present

## 2017-09-20 DIAGNOSIS — R002 Palpitations: Secondary | ICD-10-CM | POA: Diagnosis not present

## 2017-09-20 DIAGNOSIS — Z760 Encounter for issue of repeat prescription: Secondary | ICD-10-CM | POA: Insufficient documentation

## 2017-09-20 DIAGNOSIS — M797 Fibromyalgia: Secondary | ICD-10-CM

## 2017-09-20 DIAGNOSIS — K222 Esophageal obstruction: Secondary | ICD-10-CM | POA: Diagnosis not present

## 2017-09-20 DIAGNOSIS — R51 Headache: Secondary | ICD-10-CM | POA: Insufficient documentation

## 2017-09-20 DIAGNOSIS — K648 Other hemorrhoids: Secondary | ICD-10-CM | POA: Diagnosis not present

## 2017-09-20 DIAGNOSIS — K589 Irritable bowel syndrome without diarrhea: Secondary | ICD-10-CM | POA: Diagnosis not present

## 2017-09-20 DIAGNOSIS — R55 Syncope and collapse: Secondary | ICD-10-CM | POA: Insufficient documentation

## 2017-09-20 DIAGNOSIS — M545 Low back pain: Secondary | ICD-10-CM | POA: Diagnosis not present

## 2017-09-20 DIAGNOSIS — M4716 Other spondylosis with myelopathy, lumbar region: Secondary | ICD-10-CM

## 2017-09-20 DIAGNOSIS — M47814 Spondylosis without myelopathy or radiculopathy, thoracic region: Secondary | ICD-10-CM

## 2017-09-20 DIAGNOSIS — K449 Diaphragmatic hernia without obstruction or gangrene: Secondary | ICD-10-CM | POA: Insufficient documentation

## 2017-09-20 DIAGNOSIS — F419 Anxiety disorder, unspecified: Secondary | ICD-10-CM | POA: Insufficient documentation

## 2017-09-20 DIAGNOSIS — Z86010 Personal history of colonic polyps: Secondary | ICD-10-CM | POA: Insufficient documentation

## 2017-09-20 DIAGNOSIS — K0889 Other specified disorders of teeth and supporting structures: Secondary | ICD-10-CM | POA: Diagnosis not present

## 2017-09-20 MED ORDER — HYDROCODONE-ACETAMINOPHEN 7.5-325 MG PO TABS: 2.0000 | ORAL_TABLET | Freq: Three times a day (TID) | ORAL | 0 refills | Status: DC | PRN

## 2017-09-20 NOTE — Patient Instructions (Addendum)
Wean  Lyrica off due to mood changes to every other day   Call office in a week to evaluate medication changes : [phone]

## 2017-09-20 NOTE — Progress Notes (Signed)
Subjective:    Patient ID: [name], female    DOB: April 03, 1976, 42 y.o., 42 y.o.   MRN: [id number]  HPI: [name] is a 42year old female who returns for follow up appointmentfor chronic pain and medication refill. She states her pain is located in her bilateral shoulders and mid-lower back She rates her pain 5. Her current exercise regime is walking.  .Ms. Guttman reports again she's still  experiencing different mood changes and weight gain, we will wean off Lyrica  And instructions givenshe verbalizes understanding . She denies suicidal thoughts.  Ms. Rote Morphine equivalent is  33.00 MME, her Hydrocodone will be increase and her MME will be 45.00 MME with new prescription.   She is also prescribed Klonopin by Dr. Gerarda Fraction .We have reviewed the black box warning again regarding using opioids and benzodiazepines.I highlighted the dangers of using these drugs together and discussed the adverse events including respiratory suppression, overdose, cognitive impairment and importance of  compliance with current regimen. She verbalizes understanding, we will continue to monitor and adjust as indicated.    Oral Swab was performed on 06/22/2017, it was consistent.    Pain Inventory Average Pain 7 Pain Right Now 5 My pain is intermittent, constant, sharp, burning, dull, stabbing, tingling and aching  In the last 24 hours, has pain interfered with the following? General activity 8 Relation with others 8 Enjoyment of life 7 What TIME of day is your pain at its worst? morning and night, evening, night Sleep (in general) Fair Pain is worse with: walking, bending, sitting, inactivity, standing, unsure and some activites Pain improves with: rest, heat/ice, medication and TENS Relief from Meds: 6  Mobility walk without assistance how many minutes can you walk? 20 ability to climb steps?  yes do you drive?  yes Do you have any goals in this area?  yes  Function disabled: date  disabled . I need assistance with the following:  meal prep, household duties and shopping  Neuro/Psych weakness numbness tremor tingling spasms dizziness confusion  Prior Studies Any changes since last visit?  no  Physicians involved in your care Any changes since last visit?  no   Family History  Problem Relation Age of Onset  . Diabetes type II Mother   . Colon polyps Mother   . Diverticulitis Mother   . Diabetes Mother   . Heart disease Mother   . Osteoporosis Father   . Diabetes type II Brother   . Uterine cancer Unknown   . Ovarian cancer Unknown   . COPD Paternal Grandfather   . Heart attack Maternal Grandmother    Social History   Socioeconomic History  . Marital status: Married    Spouse name: None  . Number of children: 2  . Years of education: None  . Highest education level: None  Social Needs  . Financial resource strain: None  . Food insecurity - worry: None  . Food insecurity - inability: None  . Transportation needs - medical: None  . Transportation needs - non-medical: None  Occupational History  . Occupation: Disability    Employer: UNEMPLOYED  Tobacco Use  . Smoking status: Former Smoker    Types: Cigarettes    Last attempt to quit: 06/30/2011    Years since quitting: 6.2  . Smokeless tobacco: Never Used  Substance and Sexual Activity  . Alcohol use: No    Alcohol/week: 0.0 oz  . Drug use: No  . Sexual activity: Yes  Other Topics Concern  .  None  Social History Narrative  . None   Past Surgical History:  Procedure Laterality Date  . CESAREAN SECTION    . Left elbow surgery    . PARTIAL HYSTERECTOMY    . TONSILLECTOMY     Past Medical History:  Diagnosis Date  . Anxiety   . Chronic pain    Dr. Letta Pate  . Degenerative disc disease    Spinal, small syrinx  . Depression   . Esophageal stricture    Status post dilatation  . Fibromyalgia   . GERD (gastroesophageal reflux disease)   . Hiatal hernia   . Internal  hemorrhoids without mention of complication   . Irritable bowel syndrome   . Palpitations    No documented arrhythmias  . Personal history of colonic polyps 10/04/2000   HYPERPLASTIC POLYP  . POTS (postural orthostatic tachycardia syndrome)   . Syncope    Negative tilt table test  . Syringomyelia and syringobulbia (HCC)    BP (!) 134/93   Pulse 100   SpO2 98%   Opioid Risk Score:  4 Fall Risk Score:  `1  Depression screen PHQ 2/9  Depression screen [facility] 2/9 09/20/2017 07/22/2017 05/24/2017 05/26/2016 02/24/2016 04/23/2015 03/26/2015  Decreased Interest 1 1 1 1 1 1 1   Down, Depressed, Hopeless 1 1 1 1 1 1 1   PHQ - 2 Score 2 2 2 2 2 2 2   Altered sleeping - - - - - - -  Tired, decreased energy - - - - - - -  Change in appetite - - - - - - -  Feeling bad or failure about yourself  - - - - - - -  Trouble concentrating - - - - - - -  Moving slowly or fidgety/restless - - - - - - -  Suicidal thoughts - - - - - - -  PHQ-9 Score - - - - - - -    Review of Systems  Constitutional: Positive for unexpected weight change.  HENT: Negative.   Eyes: Negative.   Respiratory: Negative.   Cardiovascular: Negative.   Gastrointestinal: Negative.   Endocrine: Negative.   Genitourinary: Negative.   Musculoskeletal: Positive for arthralgias, back pain, myalgias and neck pain.  Skin: Negative.   Allergic/Immunologic: Negative.   Neurological: Positive for tremors, weakness, numbness and headaches.       Tingling  Hematological: Negative.   Psychiatric/Behavioral: Positive for confusion and dysphoric mood. The patient is nervous/anxious.   All other systems reviewed and are negative.      Objective:   Physical Exam  Constitutional: She is oriented to person, place, and time. She appears well-developed and well-nourished.  HENT:  Head: Normocephalic and atraumatic.  Neck: Normal range of motion. Neck supple.  Cervical Paraspinal Tenderness: C-5-C-6  Cardiovascular: Normal rate and regular  rhythm.  Pulmonary/Chest: Effort normal and breath sounds normal.  Musculoskeletal:  Normal Muscle Bulk and Muscle Testing Reveals: Upper Extremities: Full ROM and Muscle Strength 5/5 Thoracic Paraspinal Tenderness: T-7-T-9 Lumbar Paraspinal Tenderness: L-3-L-5 Lower Extremities: Full ROM and Muscle Strength 5/5 Arises from chair with ease Narrow Based Gait  Neurological: She is alert and oriented to person, place, and time.  Skin: Skin is warm and dry.  Psychiatric: She has a normal mood and affect.  Nursing note and vitals reviewed.         Assessment & Plan:  1. Lumbar degenerative disc disease: 09/20/2017 Refilled: Increased: HYDROcodone 7.5/325 mg two tablets every 8 hours as needed #180. We will  continue the opioid monitoring program, this consists of regular clinic visits, examinations, urine drug screen, pill counts as well as use of [place] Controlled Substance Reporting System.  2. Thoracic Spondylosis: Continue HEP and  current medication regime. 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 Continue with exercise and heat therapy. 09/20/2017 4. Fibromyalgia: Discontinued:  Lyrica, Adverse Effects. Continue with Heat and exercise therapy. 09/20/2017 5.Depression and anxiety: PCP Following. 09/20/2017 6.Orthostatic hypotension: Cardiology Following. 09/20/2017 7. Polyarthralgia: Continue Meloxicam  8. Chronic Shoulder Pain: Continue current medication regime. Continue HEP as Tolerated. 09/20/2017.  20 minutes of face to face patient care time was spent during this visit. All questions were encouraged and answered.  F/U in 1 month

## 2017-09-29 DIAGNOSIS — R319 Hematuria, unspecified: Secondary | ICD-10-CM | POA: Diagnosis not present

## 2017-09-29 DIAGNOSIS — Z6822 Body mass index (BMI) 22.0-22.9, adult: Secondary | ICD-10-CM | POA: Diagnosis not present

## 2017-10-18 ENCOUNTER — Encounter: Payer: Self-pay | Admitting: Registered Nurse

## 2017-10-18 ENCOUNTER — Encounter: Payer: Medicare Other | Attending: Physical Medicine & Rehabilitation | Admitting: Registered Nurse

## 2017-10-18 VITALS — BP 123/84 | HR 91

## 2017-10-18 DIAGNOSIS — F419 Anxiety disorder, unspecified: Secondary | ICD-10-CM | POA: Insufficient documentation

## 2017-10-18 DIAGNOSIS — M47814 Spondylosis without myelopathy or radiculopathy, thoracic region: Secondary | ICD-10-CM

## 2017-10-18 DIAGNOSIS — K449 Diaphragmatic hernia without obstruction or gangrene: Secondary | ICD-10-CM | POA: Insufficient documentation

## 2017-10-18 DIAGNOSIS — M797 Fibromyalgia: Secondary | ICD-10-CM | POA: Diagnosis not present

## 2017-10-18 DIAGNOSIS — Z760 Encounter for issue of repeat prescription: Secondary | ICD-10-CM | POA: Insufficient documentation

## 2017-10-18 DIAGNOSIS — Z79899 Other long term (current) drug therapy: Secondary | ICD-10-CM

## 2017-10-18 DIAGNOSIS — Z87891 Personal history of nicotine dependence: Secondary | ICD-10-CM | POA: Insufficient documentation

## 2017-10-18 DIAGNOSIS — F329 Major depressive disorder, single episode, unspecified: Secondary | ICD-10-CM | POA: Insufficient documentation

## 2017-10-18 DIAGNOSIS — M5136 Other intervertebral disc degeneration, lumbar region: Secondary | ICD-10-CM | POA: Diagnosis not present

## 2017-10-18 DIAGNOSIS — G8929 Other chronic pain: Secondary | ICD-10-CM | POA: Diagnosis not present

## 2017-10-18 DIAGNOSIS — M47892 Other spondylosis, cervical region: Secondary | ICD-10-CM | POA: Insufficient documentation

## 2017-10-18 DIAGNOSIS — M4716 Other spondylosis with myelopathy, lumbar region: Secondary | ICD-10-CM

## 2017-10-18 DIAGNOSIS — M25511 Pain in right shoulder: Secondary | ICD-10-CM

## 2017-10-18 DIAGNOSIS — Z599 Problem related to housing and economic circumstances, unspecified: Secondary | ICD-10-CM | POA: Diagnosis not present

## 2017-10-18 DIAGNOSIS — Z5181 Encounter for therapeutic drug level monitoring: Secondary | ICD-10-CM | POA: Diagnosis not present

## 2017-10-18 DIAGNOSIS — K648 Other hemorrhoids: Secondary | ICD-10-CM | POA: Insufficient documentation

## 2017-10-18 DIAGNOSIS — K589 Irritable bowel syndrome without diarrhea: Secondary | ICD-10-CM | POA: Insufficient documentation

## 2017-10-18 DIAGNOSIS — K222 Esophageal obstruction: Secondary | ICD-10-CM | POA: Diagnosis not present

## 2017-10-18 DIAGNOSIS — G894 Chronic pain syndrome: Secondary | ICD-10-CM | POA: Diagnosis not present

## 2017-10-18 DIAGNOSIS — I951 Orthostatic hypotension: Secondary | ICD-10-CM | POA: Diagnosis not present

## 2017-10-18 DIAGNOSIS — M542 Cervicalgia: Secondary | ICD-10-CM | POA: Insufficient documentation

## 2017-10-18 DIAGNOSIS — Z86010 Personal history of colonic polyps: Secondary | ICD-10-CM | POA: Diagnosis not present

## 2017-10-18 DIAGNOSIS — M25512 Pain in left shoulder: Secondary | ICD-10-CM | POA: Diagnosis not present

## 2017-10-18 DIAGNOSIS — R51 Headache: Secondary | ICD-10-CM | POA: Insufficient documentation

## 2017-10-18 DIAGNOSIS — R002 Palpitations: Secondary | ICD-10-CM | POA: Diagnosis not present

## 2017-10-18 DIAGNOSIS — R55 Syncope and collapse: Secondary | ICD-10-CM | POA: Diagnosis not present

## 2017-10-18 DIAGNOSIS — M545 Low back pain: Secondary | ICD-10-CM | POA: Insufficient documentation

## 2017-10-18 DIAGNOSIS — K219 Gastro-esophageal reflux disease without esophagitis: Secondary | ICD-10-CM | POA: Diagnosis not present

## 2017-10-18 DIAGNOSIS — M2550 Pain in unspecified joint: Secondary | ICD-10-CM | POA: Diagnosis not present

## 2017-10-18 DIAGNOSIS — K0889 Other specified disorders of teeth and supporting structures: Secondary | ICD-10-CM | POA: Diagnosis not present

## 2017-10-18 DIAGNOSIS — R000 Tachycardia, unspecified: Secondary | ICD-10-CM | POA: Diagnosis not present

## 2017-10-18 MED ORDER — HYDROCODONE-ACETAMINOPHEN 7.5-325 MG PO TABS: 2.0000 | ORAL_TABLET | Freq: Three times a day (TID) | ORAL | 0 refills | Status: DC | PRN

## 2017-10-18 NOTE — Progress Notes (Signed)
Subjective:    Patient ID: [name], female    DOB: 28-May-1976, 42 y.o.   MRN: [id number]  HPI: [name] is a 42year old female who returns for follow up appointmentfor chronic pain and medication refill. She states her pain is located in her left shoulder, mid-lower back, left buttock radiating into her left lower extremity. She rates her pain 7. Her current exercise regime is walking and performing stretching exercises.  [name] Morphine equivalent is  46.00 MME.  She is also prescribed Klonopin by Dr. Gerarda Fraction .We have reviewed the black box warning again regarding using opioids and benzodiazepines.I highlighted the dangers of using these drugs together and discussed the adverse events including respiratory suppression, overdose, cognitive impairment and importance of  compliance with current regimen. She verbalizes understanding, we will continue to monitor and adjust as indicated.    Oral Swab was performed on 06/22/2017, it was consistent. UDS ordered today.   Pain Inventory Average Pain 7 Pain Right Now 7 My pain is intermittent, constant, sharp, burning, dull, stabbing, tingling and aching  In the last 24 hours, has pain interfered with the following? General activity 7 Relation with others 7 Enjoyment of life 7 What TIME of day is your pain at its worst? morning and night, evening, night Sleep (in general) Fair Pain is worse with: walking, bending, sitting, inactivity, standing, unsure and some activites Pain improves with: rest, heat/ice, medication and TENS Relief from Meds: 6  Mobility walk without assistance how many minutes can you walk? 20 ability to climb steps?  yes do you drive?  yes Do you have any goals in this area?  yes  Function disabled: date disabled . I need assistance with the following:  meal prep, household duties and shopping  Neuro/Psych weakness numbness tremor tingling spasms dizziness confusion  Prior  Studies Any changes since last visit?  no  Physicians involved in your care Any changes since last visit?  no   Family History  Problem Relation Age of Onset  . Diabetes type II Mother   . Colon polyps Mother   . Diverticulitis Mother   . Diabetes Mother   . Heart disease Mother   . Osteoporosis Father   . Diabetes type II Brother   . Uterine cancer Unknown   . Ovarian cancer Unknown   . COPD Paternal Grandfather   . Heart attack Maternal Grandmother    Social History   Socioeconomic History  . Marital status: Married    Spouse name: None  . Number of children: 2  . Years of education: None  . Highest education level: None  Social Needs  . Financial resource strain: None  . Food insecurity - worry: None  . Food insecurity - inability: None  . Transportation needs - medical: None  . Transportation needs - non-medical: None  Occupational History  . Occupation: Disability    Employer: UNEMPLOYED  Tobacco Use  . Smoking status: Former Smoker    Types: Cigarettes    Last attempt to quit: 06/30/2011    Years since quitting: 6.3  . Smokeless tobacco: Never Used  Substance and Sexual Activity  . Alcohol use: No    Alcohol/week: 0.0 oz  . Drug use: No  . Sexual activity: Yes  Other Topics Concern  . None  Social History Narrative  . None   Past Surgical History:  Procedure Laterality Date  . CESAREAN SECTION    . Left elbow surgery    .  PARTIAL HYSTERECTOMY    . TONSILLECTOMY     Past Medical History:  Diagnosis Date  . Anxiety   . Chronic pain    Dr. Letta Pate  . Degenerative disc disease    Spinal, small syrinx  . Depression   . Esophageal stricture    Status post dilatation  . Fibromyalgia   . GERD (gastroesophageal reflux disease)   . Hiatal hernia   . Internal hemorrhoids without mention of complication   . Irritable bowel syndrome   . Palpitations    No documented arrhythmias  . Personal history of colonic polyps 10/04/2000   HYPERPLASTIC  POLYP  . POTS (postural orthostatic tachycardia syndrome)   . Syncope    Negative tilt table test  . Syringomyelia and syringobulbia (HCC)    BP 123/84   Pulse 91   SpO2 97%   Opioid Risk Score:  4 Fall Risk Score:  `1  Depression screen PHQ 2/9  Depression screen [facility] 2/9 09/20/2017 07/22/2017 05/24/2017 05/26/2016 02/24/2016 04/23/2015 03/26/2015  Decreased Interest 1 1 1 1 1 1 1   Down, Depressed, Hopeless 1 1 1 1 1 1 1   PHQ - 2 Score 2 2 2 2 2 2 2   Altered sleeping - - - - - - -  Tired, decreased energy - - - - - - -  Change in appetite - - - - - - -  Feeling bad or failure about yourself  - - - - - - -  Trouble concentrating - - - - - - -  Moving slowly or fidgety/restless - - - - - - -  Suicidal thoughts - - - - - - -  PHQ-9 Score - - - - - - -    Review of Systems  HENT: Negative.   Eyes: Negative.   Respiratory: Negative.   Cardiovascular: Negative.   Gastrointestinal: Negative.   Endocrine: Negative.   Genitourinary: Negative.   Musculoskeletal: Positive for arthralgias, back pain, myalgias and neck pain.  Skin: Negative.   Allergic/Immunologic: Negative.   Neurological: Positive for tremors, weakness, numbness and headaches.       Tingling  Hematological: Negative.   Psychiatric/Behavioral: Positive for confusion and dysphoric mood. The patient is nervous/anxious.   All other systems reviewed and are negative.      Objective:   Physical Exam  Constitutional: She is oriented to person, place, and time. She appears well-developed and well-nourished.  HENT:  Head: Normocephalic and atraumatic.  Neck: Normal range of motion. Neck supple.  Cervical Paraspinal Tenderness: C-5-C-6  Cardiovascular: Normal rate and regular rhythm.  Pulmonary/Chest: Effort normal and breath sounds normal.  Musculoskeletal:  Normal Muscle Bulk and Muscle Testing Reveals: Upper Extremities: Full ROM and Muscle Strength 5/5 Thoracic Paraspinal Tenderness: T-7-T-9 Lumbar Paraspinal  Tenderness: L-4-L-5 Lower Extremities: Full ROM and Muscle Strength 5/5 Arises from chair with ease Narrow Based Gait  Neurological: She is alert and oriented to person, place, and time.  Skin: Skin is warm and dry.  Psychiatric: She has a normal mood and affect.  Nursing note and vitals reviewed.         Assessment & Plan:  1. Lumbar degenerative disc disease: 10/18/2017 Refilled: HYDROcodone 7.5/325 mg two tablets every 8 hours as needed #180. We will continue the opioid monitoring program, this consists of regular clinic visits, examinations, urine drug screen, pill counts as well as use of [place] Controlled Substance Reporting System.  2. Thoracic Spondylosis: Continue HEP and  current medication regime. 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  Lyrica discontinued due to adverse effects. Continue with exercise and heat therapy. 10/18/2017 4. Fibromyalgia:Continue with Heat and exercise Regimen  Lyrica discontinued , Adverse Effects. 10/18/2017 5.Depression and anxiety: PCP Following. 10/18/2017 6.Orthostatic hypotension: Cardiology Following. 10/18/2017 7. Polyarthralgia: Continue Meloxicam . 10/18/2017 8. Chronic Shoulder Pain: Continue current medication regime. Continue HEP as Tolerated. 10/18/2017.  20 minutes of face to face patient care time was spent during this visit. All questions were encouraged and answered.  F/U in 1 month

## 2017-10-25 LAB — TOXASSURE SELECT,+ANTIDEPR,UR

## 2017-10-27 ENCOUNTER — Telehealth: Payer: Self-pay | Admitting: *Deleted

## 2017-10-27 NOTE — Telephone Encounter (Signed)
Urine drug screen for this encounter is consistent for prescribed medication 

## 2017-11-10 ENCOUNTER — Encounter: Payer: Medicare Other | Attending: Physical Medicine & Rehabilitation | Admitting: Registered Nurse

## 2017-11-10 ENCOUNTER — Encounter: Payer: Self-pay | Admitting: Registered Nurse

## 2017-11-10 VITALS — BP 105/75 | HR 90 | Resp 14 | Ht 67.0 in | Wt 137.0 lb

## 2017-11-10 DIAGNOSIS — F419 Anxiety disorder, unspecified: Secondary | ICD-10-CM | POA: Insufficient documentation

## 2017-11-10 DIAGNOSIS — M2550 Pain in unspecified joint: Secondary | ICD-10-CM

## 2017-11-10 DIAGNOSIS — M47892 Other spondylosis, cervical region: Secondary | ICD-10-CM | POA: Insufficient documentation

## 2017-11-10 DIAGNOSIS — M47812 Spondylosis without myelopathy or radiculopathy, cervical region: Secondary | ICD-10-CM | POA: Diagnosis not present

## 2017-11-10 DIAGNOSIS — G894 Chronic pain syndrome: Secondary | ICD-10-CM | POA: Diagnosis not present

## 2017-11-10 DIAGNOSIS — R000 Tachycardia, unspecified: Secondary | ICD-10-CM | POA: Insufficient documentation

## 2017-11-10 DIAGNOSIS — M797 Fibromyalgia: Secondary | ICD-10-CM | POA: Insufficient documentation

## 2017-11-10 DIAGNOSIS — Z5181 Encounter for therapeutic drug level monitoring: Secondary | ICD-10-CM

## 2017-11-10 DIAGNOSIS — G8929 Other chronic pain: Secondary | ICD-10-CM | POA: Insufficient documentation

## 2017-11-10 DIAGNOSIS — Z86010 Personal history of colonic polyps: Secondary | ICD-10-CM | POA: Diagnosis not present

## 2017-11-10 DIAGNOSIS — K222 Esophageal obstruction: Secondary | ICD-10-CM | POA: Diagnosis not present

## 2017-11-10 DIAGNOSIS — M542 Cervicalgia: Secondary | ICD-10-CM | POA: Insufficient documentation

## 2017-11-10 DIAGNOSIS — M5136 Other intervertebral disc degeneration, lumbar region: Secondary | ICD-10-CM | POA: Insufficient documentation

## 2017-11-10 DIAGNOSIS — I951 Orthostatic hypotension: Secondary | ICD-10-CM | POA: Insufficient documentation

## 2017-11-10 DIAGNOSIS — Z760 Encounter for issue of repeat prescription: Secondary | ICD-10-CM | POA: Diagnosis not present

## 2017-11-10 DIAGNOSIS — M545 Low back pain: Secondary | ICD-10-CM | POA: Diagnosis not present

## 2017-11-10 DIAGNOSIS — K449 Diaphragmatic hernia without obstruction or gangrene: Secondary | ICD-10-CM | POA: Diagnosis not present

## 2017-11-10 DIAGNOSIS — Z599 Problem related to housing and economic circumstances, unspecified: Secondary | ICD-10-CM | POA: Diagnosis not present

## 2017-11-10 DIAGNOSIS — M5412 Radiculopathy, cervical region: Secondary | ICD-10-CM

## 2017-11-10 DIAGNOSIS — K648 Other hemorrhoids: Secondary | ICD-10-CM | POA: Diagnosis not present

## 2017-11-10 DIAGNOSIS — R002 Palpitations: Secondary | ICD-10-CM | POA: Diagnosis not present

## 2017-11-10 DIAGNOSIS — M25512 Pain in left shoulder: Secondary | ICD-10-CM

## 2017-11-10 DIAGNOSIS — F329 Major depressive disorder, single episode, unspecified: Secondary | ICD-10-CM | POA: Diagnosis not present

## 2017-11-10 DIAGNOSIS — M4716 Other spondylosis with myelopathy, lumbar region: Secondary | ICD-10-CM

## 2017-11-10 DIAGNOSIS — M47814 Spondylosis without myelopathy or radiculopathy, thoracic region: Secondary | ICD-10-CM | POA: Diagnosis not present

## 2017-11-10 DIAGNOSIS — R55 Syncope and collapse: Secondary | ICD-10-CM | POA: Diagnosis not present

## 2017-11-10 DIAGNOSIS — K219 Gastro-esophageal reflux disease without esophagitis: Secondary | ICD-10-CM | POA: Diagnosis not present

## 2017-11-10 DIAGNOSIS — M25511 Pain in right shoulder: Secondary | ICD-10-CM

## 2017-11-10 DIAGNOSIS — R51 Headache: Secondary | ICD-10-CM | POA: Insufficient documentation

## 2017-11-10 DIAGNOSIS — K0889 Other specified disorders of teeth and supporting structures: Secondary | ICD-10-CM | POA: Diagnosis not present

## 2017-11-10 DIAGNOSIS — Z87891 Personal history of nicotine dependence: Secondary | ICD-10-CM | POA: Insufficient documentation

## 2017-11-10 DIAGNOSIS — K589 Irritable bowel syndrome without diarrhea: Secondary | ICD-10-CM | POA: Insufficient documentation

## 2017-11-10 DIAGNOSIS — Z79891 Long term (current) use of opiate analgesic: Secondary | ICD-10-CM | POA: Diagnosis not present

## 2017-11-10 MED ORDER — HYDROCODONE-ACETAMINOPHEN 7.5-325 MG PO TABS: 2.0000 | ORAL_TABLET | Freq: Three times a day (TID) | ORAL | 0 refills | Status: DC | PRN

## 2017-11-10 NOTE — Progress Notes (Signed)
Subjective:    Patient ID: [name], female    DOB: 01-09-1976, 42 y.o.   MRN: [id number]  HPI: [name] is a 42 year old female who returns for follow up appointment for chronic pain and medication refill. She states her pain is located in her neck radiating into her bilateral shoulders also lower back pain with increase activity. She rates her pain 7. Her current exercise regime is walking.   Ms. Edge Morphine equivalent is 48.00 MME.   She is also prescribed Klonopin by Dr. Gerarda Fraction. We have discussed the black box warning regarding using opioids and benzodiazepines. I highlighted the dangers of using these drugs together and discussed the adverse events including respiratory suppression, overdose, cognitive impairment and importance of compliance with current regimen. She verbalizes understanding, we will continue to monitor and adjust as indicated. .   Last UDS was Performed on 10/18/2017, it was consistent. UDS not Performed today, cancelled by Provider.   Pain Inventory Average Pain 7 Pain Right Now 7 My pain is constant, sharp, burning, dull, stabbing, tingling and aching  In the last 24 hours, has pain interfered with the following? General activity 8 Relation with others 8 Enjoyment of life 8 What TIME of day is your pain at its worst? morning, evening, night Sleep (in general) Poor  Pain is worse with: walking, bending, sitting, inactivity, standing, unsure and some activites Pain improves with: rest, heat/ice, therapy/exercise, medication and TENS Relief from Meds: 6  Mobility walk without assistance how many minutes can you walk? 20+ ability to climb steps?  yes do you drive?  yes  Function disabled: date disabled .  Neuro/Psych weakness numbness tremor tingling trouble walking spasms dizziness  Prior Studies Any changes since last visit?  no  Physicians involved in your care Any changes since last visit?  no   Family History    Problem Relation Age of Onset  . Diabetes type II Mother   . Colon polyps Mother   . Diverticulitis Mother   . Diabetes Mother   . Heart disease Mother   . Osteoporosis Father   . Diabetes type II Brother   . Uterine cancer Unknown   . Ovarian cancer Unknown   . COPD Paternal Grandfather   . Heart attack Maternal Grandmother    Social History   Socioeconomic History  . Marital status: Married    Spouse name: Not on file  . Number of children: 2  . Years of education: Not on file  . Highest education level: Not on file  Occupational History  . Occupation: Disability    Employer: UNEMPLOYED  Social Needs  . Financial resource strain: Not on file  . Food insecurity:    Worry: Not on file    Inability: Not on file  . Transportation needs:    Medical: Not on file    Non-medical: Not on file  Tobacco Use  . Smoking status: Former Smoker    Types: Cigarettes    Last attempt to quit: 06/30/2011    Years since quitting: 6.3  . Smokeless tobacco: Never Used  Substance and Sexual Activity  . Alcohol use: No    Alcohol/week: 0.0 oz  . Drug use: No  . Sexual activity: Yes  Lifestyle  . Physical activity:    Days per week: Not on file    Minutes per session: Not on file  . Stress: Not on file  Relationships  . Social connections:    Talks on phone:  Not on file    Gets together: Not on file    Attends religious service: Not on file    Active member of club or organization: Not on file    Attends meetings of clubs or organizations: Not on file    Relationship status: Not on file  Other Topics Concern  . Not on file  Social History Narrative  . Not on file   Past Surgical History:  Procedure Laterality Date  . CESAREAN SECTION    . Left elbow surgery    . PARTIAL HYSTERECTOMY    . TONSILLECTOMY     Past Medical History:  Diagnosis Date  . Anxiety   . Chronic pain    Dr. Letta Pate  . Degenerative disc disease    Spinal, small syrinx  . Depression   .  Esophageal stricture    Status post dilatation  . Fibromyalgia   . GERD (gastroesophageal reflux disease)   . Hiatal hernia   . Internal hemorrhoids without mention of complication   . Irritable bowel syndrome   . Palpitations    No documented arrhythmias  . Personal history of colonic polyps 10/04/2000   HYPERPLASTIC POLYP  . POTS (postural orthostatic tachycardia syndrome)   . Syncope    Negative tilt table test  . Syringomyelia and syringobulbia (HCC)    BP 105/75 (BP Location: Left Arm, Patient Position: Sitting, Cuff Size: Normal)   Pulse 90   Resp 14   Ht 5' 7"  (1.702 m)   Wt 137 lb (62.1 kg)   SpO2 98%   BMI 21.46 kg/m   Opioid Risk Score:   Fall Risk Score:  `1  Depression screen PHQ 2/9  Depression screen [facility] 2/9 09/20/2017 07/22/2017 05/24/2017 05/26/2016 02/24/2016 04/23/2015 03/26/2015  Decreased Interest 1 1 1 1 1 1 1   Down, Depressed, Hopeless 1 1 1 1 1 1 1   PHQ - 2 Score 2 2 2 2 2 2 2   Altered sleeping - - - - - - -  Tired, decreased energy - - - - - - -  Change in appetite - - - - - - -  Feeling bad or failure about yourself  - - - - - - -  Trouble concentrating - - - - - - -  Moving slowly or fidgety/restless - - - - - - -  Suicidal thoughts - - - - - - -  PHQ-9 Score - - - - - - -    Review of Systems  Constitutional: Negative.   HENT: Negative.   Eyes: Negative.   Respiratory: Negative.   Gastrointestinal: Negative.   Endocrine: Negative.   Genitourinary: Negative.   Musculoskeletal: Positive for back pain and neck pain.  Skin: Negative.   Allergic/Immunologic: Negative.   Neurological: Negative.   Hematological: Negative.   Psychiatric/Behavioral: Negative.   All other systems reviewed and are negative.      Objective:   Physical Exam  Constitutional: She is oriented to person, place, and time. She appears well-developed and well-nourished.  HENT:  Head: Normocephalic and atraumatic.  Neck: Normal range of motion. Neck supple.    Cardiovascular: Normal rate and regular rhythm.  Pulmonary/Chest: Effort normal and breath sounds normal.  Musculoskeletal:  Normal Muscle Bulk and Muscle Testing Reveals: Upper Extremities: Full ROM and Muscle Strength 5/5 Right AC Joint Tenderness Thoracic Paraspinal Tenderness: T-1-T-3 Lower Extremities: Full ROM and Muscle Strength 5/5 Arises from chair with ease Narrow Based Gait  Neurological: She is alert and oriented  to person, place, and time.  Skin: Skin is warm and dry.  Psychiatric: She has a normal mood and affect.  Nursing note and vitals reviewed.         Assessment & Plan:  1. Lumbar degenerative disc disease: 11/10/2017 Continue with current treatment regimen: Refilled: HYDROcodone 7.5/325 mg two tablets every 8 hours as needed #180. We will continue the opioid monitoring program, this consists of regular clinic visits, examinations, urine drug screen, pill counts as well as use of [place] Controlled Substance Reporting System.  2. Thoracic Spondylosis: Continue current treatment with HEP and  current medication regime. 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 .  Lyrica discontinued due to adverse effects. Continue with exercise and heat therapy. 11/10/2017 4. Fibromyalgia:Continue with Heat and exercise Regimen  Lyrica discontinued , Adverse Effects. 11/10/2017 5.Depression and anxiety: PCP Following. 11/10/2017 6.Orthostatic hypotension: Cardiology Following. 11/10/2017 7. Polyarthralgia: Continue current treatment with  Meloxicam . 11/10/2017 8. Chronic Shoulder Pain: Continue current medication regime. Continue HEP as Tolerated. 11/10/2017.  20 minutes of face to face patient care time was spent during this visit. All questions were encouraged and answered.  F/U in 1 month

## 2017-12-17 ENCOUNTER — Encounter: Payer: Self-pay | Admitting: Registered Nurse

## 2017-12-17 ENCOUNTER — Encounter: Payer: Medicare Other | Attending: Physical Medicine & Rehabilitation | Admitting: Registered Nurse

## 2017-12-17 VITALS — BP 115/75 | HR 68 | Ht 67.0 in | Wt 137.0 lb

## 2017-12-17 DIAGNOSIS — M5412 Radiculopathy, cervical region: Secondary | ICD-10-CM | POA: Diagnosis not present

## 2017-12-17 DIAGNOSIS — F419 Anxiety disorder, unspecified: Secondary | ICD-10-CM | POA: Insufficient documentation

## 2017-12-17 DIAGNOSIS — M47814 Spondylosis without myelopathy or radiculopathy, thoracic region: Secondary | ICD-10-CM | POA: Diagnosis not present

## 2017-12-17 DIAGNOSIS — Z87891 Personal history of nicotine dependence: Secondary | ICD-10-CM | POA: Insufficient documentation

## 2017-12-17 DIAGNOSIS — K648 Other hemorrhoids: Secondary | ICD-10-CM | POA: Insufficient documentation

## 2017-12-17 DIAGNOSIS — M542 Cervicalgia: Secondary | ICD-10-CM | POA: Insufficient documentation

## 2017-12-17 DIAGNOSIS — Z599 Problem related to housing and economic circumstances, unspecified: Secondary | ICD-10-CM | POA: Insufficient documentation

## 2017-12-17 DIAGNOSIS — K222 Esophageal obstruction: Secondary | ICD-10-CM | POA: Insufficient documentation

## 2017-12-17 DIAGNOSIS — Z760 Encounter for issue of repeat prescription: Secondary | ICD-10-CM | POA: Insufficient documentation

## 2017-12-17 DIAGNOSIS — M62838 Other muscle spasm: Secondary | ICD-10-CM | POA: Diagnosis not present

## 2017-12-17 DIAGNOSIS — M5136 Other intervertebral disc degeneration, lumbar region: Secondary | ICD-10-CM | POA: Diagnosis not present

## 2017-12-17 DIAGNOSIS — R51 Headache: Secondary | ICD-10-CM | POA: Insufficient documentation

## 2017-12-17 DIAGNOSIS — M4716 Other spondylosis with myelopathy, lumbar region: Secondary | ICD-10-CM | POA: Diagnosis not present

## 2017-12-17 DIAGNOSIS — Z86010 Personal history of colonic polyps: Secondary | ICD-10-CM | POA: Diagnosis not present

## 2017-12-17 DIAGNOSIS — K589 Irritable bowel syndrome without diarrhea: Secondary | ICD-10-CM | POA: Insufficient documentation

## 2017-12-17 DIAGNOSIS — K0889 Other specified disorders of teeth and supporting structures: Secondary | ICD-10-CM | POA: Diagnosis not present

## 2017-12-17 DIAGNOSIS — M545 Low back pain: Secondary | ICD-10-CM | POA: Insufficient documentation

## 2017-12-17 DIAGNOSIS — M797 Fibromyalgia: Secondary | ICD-10-CM | POA: Diagnosis not present

## 2017-12-17 DIAGNOSIS — R002 Palpitations: Secondary | ICD-10-CM | POA: Diagnosis not present

## 2017-12-17 DIAGNOSIS — Z5181 Encounter for therapeutic drug level monitoring: Secondary | ICD-10-CM | POA: Diagnosis not present

## 2017-12-17 DIAGNOSIS — Z79891 Long term (current) use of opiate analgesic: Secondary | ICD-10-CM

## 2017-12-17 DIAGNOSIS — I951 Orthostatic hypotension: Secondary | ICD-10-CM | POA: Insufficient documentation

## 2017-12-17 DIAGNOSIS — F329 Major depressive disorder, single episode, unspecified: Secondary | ICD-10-CM | POA: Insufficient documentation

## 2017-12-17 DIAGNOSIS — G894 Chronic pain syndrome: Secondary | ICD-10-CM | POA: Diagnosis not present

## 2017-12-17 DIAGNOSIS — M2550 Pain in unspecified joint: Secondary | ICD-10-CM | POA: Diagnosis not present

## 2017-12-17 DIAGNOSIS — R000 Tachycardia, unspecified: Secondary | ICD-10-CM | POA: Insufficient documentation

## 2017-12-17 DIAGNOSIS — R55 Syncope and collapse: Secondary | ICD-10-CM | POA: Diagnosis not present

## 2017-12-17 DIAGNOSIS — G8929 Other chronic pain: Secondary | ICD-10-CM | POA: Insufficient documentation

## 2017-12-17 DIAGNOSIS — M47812 Spondylosis without myelopathy or radiculopathy, cervical region: Secondary | ICD-10-CM

## 2017-12-17 DIAGNOSIS — K449 Diaphragmatic hernia without obstruction or gangrene: Secondary | ICD-10-CM | POA: Insufficient documentation

## 2017-12-17 DIAGNOSIS — M47892 Other spondylosis, cervical region: Secondary | ICD-10-CM | POA: Insufficient documentation

## 2017-12-17 DIAGNOSIS — K219 Gastro-esophageal reflux disease without esophagitis: Secondary | ICD-10-CM | POA: Diagnosis not present

## 2017-12-17 MED ORDER — HYDROCODONE-ACETAMINOPHEN 7.5-325 MG PO TABS: 2.0000 | ORAL_TABLET | Freq: Three times a day (TID) | ORAL | 0 refills | Status: DC | PRN

## 2017-12-17 MED ORDER — METHOCARBAMOL 500 MG PO TABS: 500.0000 mg | ORAL_TABLET | Freq: Two times a day (BID) | ORAL | 2 refills | Status: DC | PRN

## 2017-12-17 NOTE — Progress Notes (Signed)
Subjective:    Patient ID: [name], female    DOB: 1975-11-02, 42 y.o.   MRN: [id number]  HPI: [name] is a 42 year old female who returns for follow up appointment for chronic pain and medication refill. She states her pain is located in her neck radiating into her bilateral shoulders and mid-lower back pain. She rates her pain 6. Her current exercise regime is walking.   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   [name] Morphine Equivalent is 45.00 MME. She is also prescribed Clonazepam by Dr. Collene Mares .We have discussed the black box warning of using opioids and benzodiazepines. I highlighted the dangers of using these drugs together and discussed the adverse events including respiratory suppression, overdose, cognitive impairment and importance of compliance with current regimen. We will continue to monitor and adjust as indicated.   Last UDS was Performed on 10/18/2017, it was consistent.    Pain Inventory Average Pain 7 Pain Right Now 6 My pain is constant, sharp, burning, dull, stabbing, tingling and aching  In the last 24 hours, has pain interfered with the following? General activity 7 Relation with others 6 Enjoyment of life 7 What TIME of day is your pain at its worst? morning evening and night Sleep (in general) Fair  Pain is worse with: walking, bending, sitting, inactivity, standing and some activites Pain improves with: rest, heat/ice, medication and TENS Relief from Meds: 6  Mobility walk without assistance ability to climb steps?  yes do you drive?  yes  Function disabled: date disabled .  Neuro/Psych weakness numbness tremor tingling trouble walking spasms confusion depression anxiety  Prior Studies Any changes since last visit?   no  Physicians involved in your care Any changes since last visit?  no   Family History  Problem Relation Age of Onset  . Diabetes type II Mother   . Colon polyps Mother   . Diverticulitis Mother   . Diabetes Mother   . Heart disease Mother   . Osteoporosis Father   . Diabetes type II Brother   . Uterine cancer Unknown   . Ovarian cancer Unknown   . COPD Paternal Grandfather   . Heart attack Maternal Grandmother    Social History   Socioeconomic History  . Marital status: Married    Spouse name: Not on file  . Number of children: 2  . Years of education: Not on file  . Highest education level: Not on file  Occupational History  . Occupation: Disability    Employer: UNEMPLOYED  Social Needs  . Financial resource strain: Not on file  . Food insecurity:    Worry: Not on file    Inability: Not on file  . Transportation needs:    Medical: Not on file    Non-medical: Not on file  Tobacco Use  . Smoking status: Former Smoker    Types: Cigarettes    Last attempt to quit: 06/30/2011    Years since quitting: 6.4  . Smokeless tobacco: Never Used  Substance and Sexual Activity  . Alcohol use: No    Alcohol/week: 0.0 oz  . Drug use: No  . Sexual activity: Yes  Lifestyle  . Physical activity:    Days per week: Not on file    Minutes per  session: Not on file  . Stress: Not on file  Relationships  . Social connections:    Talks on phone: Not on file    Gets together: Not on file    Attends religious service: Not on file    Active member of club or organization: Not on file    Attends meetings of clubs or organizations: Not on file    Relationship status: Not on file  Other Topics Concern  . Not on file  Social History Narrative  . Not on file   Past Surgical History:  Procedure Laterality Date  . CESAREAN SECTION    . Left elbow surgery    . PARTIAL HYSTERECTOMY    . TONSILLECTOMY     Past Medical History:  Diagnosis Date  . Anxiety   . Chronic pain     Dr. Letta Pate  . Degenerative disc disease    Spinal, small syrinx  . Depression   . Esophageal stricture    Status post dilatation  . Fibromyalgia   . GERD (gastroesophageal reflux disease)   . Hiatal hernia   . Internal hemorrhoids without mention of complication   . Irritable bowel syndrome   . Palpitations    No documented arrhythmias  . Personal history of colonic polyps 10/04/2000   HYPERPLASTIC POLYP  . POTS (postural orthostatic tachycardia syndrome)   . Syncope    Negative tilt table test  . Syringomyelia and syringobulbia (HCC)    BP 115/75   Pulse 68   Ht 5' 7"  (1.702 m)   Wt 137 lb (62.1 kg)   SpO2 99%   BMI 21.46 kg/m   Opioid Risk Score:   Fall Risk Score:  `1  Depression screen PHQ 2/9  Depression screen [facility] 2/9 09/20/2017 07/22/2017 05/24/2017 05/26/2016 02/24/2016 04/23/2015 03/26/2015  Decreased Interest 1 1 1 1 1 1 1   Down, Depressed, Hopeless 1 1 1 1 1 1 1   PHQ - 2 Score 2 2 2 2 2 2 2   Altered sleeping - - - - - - -  Tired, decreased energy - - - - - - -  Change in appetite - - - - - - -  Feeling bad or failure about yourself  - - - - - - -  Trouble concentrating - - - - - - -  Moving slowly or fidgety/restless - - - - - - -  Suicidal thoughts - - - - - - -  PHQ-9 Score - - - - - - -     Review of Systems  Constitutional: Positive for chills and unexpected weight change.  HENT: Negative.   Eyes: Negative.   Respiratory: Negative.   Cardiovascular: Negative.   Gastrointestinal: Negative.   Endocrine: Negative.   Genitourinary: Negative.   Musculoskeletal: Positive for arthralgias, back pain, gait problem and myalgias.  Skin: Negative.   Allergic/Immunologic: Negative.   Neurological: Positive for tremors and numbness.  Hematological: Negative.   Psychiatric/Behavioral: Negative.   All other systems reviewed and are negative.      Objective:   Physical Exam  Constitutional: She is oriented to person, place, and time. She appears  well-developed and well-nourished.  HENT:  Head: Normocephalic and atraumatic.  Neck: Normal range of motion. Neck supple.  Cardiovascular: Normal rate and regular rhythm.  Pulmonary/Chest: Effort normal and breath sounds normal.  Musculoskeletal:  Normal Muscle Bulk and Muscle Testing Reveals: Upper Extremities: Full ROM and Muscle Strength 5/5 Thoracic Paraspinal Tenderness: T-10-T-12 Lumbar Paraspinal Tenderness: L-3-L-5 Lower  Extremities: Full ROM and Muscle Strength 5/5 Arises from Table with ease Narrow Based gait  Neurological: She is alert and oriented to person, place, and time.  Skin: Skin is warm and dry.  Psychiatric: She has a normal mood and affect.  Nursing note and vitals reviewed.         Assessment & Plan:  1. Lumbar degenerative disc disease: 12/17/2017 Continue with current treatment regimen: Refilled:HYDROcodone 7.5/325 mg two tablets every 8 hours as needed #180. We will continue the opioid monitoring program, this consists of regular clinic visits, examinations, urine drug screen, pill counts as well as use of [place] Controlled Substance Reporting System.  2. Thoracic Spondylosis: Continue current treatment with HEP and current medication regime. 12/17/2017 3 Cervicalgia/ Cervical Radiculitis/ Cervical spondylosis without evidence of myelopathy:Continue to Monitor. . Lyrica discontinued due to adverse effects. Continue with exercise and heat therapy. 12/17/2017 4. Fibromyalgia:Continue with Heat and exerciseRegimen Lyrica discontinued, Adverse Effects.12/17/2017 5.Depression and anxiety: PCP Following. 12/17/2017 6.Orthostatic hypotension: Cardiology Following. 12/17/2017 7. Polyarthralgia: Continue current treatment with  Meloxicam. 12/17/2017 8. Chronic Shoulder Pain: Continue current medication regime. Continue HEP as Tolerated. 12/17/2017. 9. Muscle Spasm: RX: Robaxin.   20 minutes of face to face patient care time was spent during  this visit. All questions were encouraged and answered.  F/U in 1 month

## 2018-01-12 ENCOUNTER — Telehealth: Payer: Self-pay

## 2018-01-12 ENCOUNTER — Telehealth: Payer: Self-pay | Admitting: Registered Nurse

## 2018-01-12 MED ORDER — HYDROCODONE-ACETAMINOPHEN 7.5-325 MG PO TABS: 2.0000 | ORAL_TABLET | Freq: Three times a day (TID) | ORAL | 0 refills | Status: DC | PRN

## 2018-01-12 NOTE — Telephone Encounter (Signed)
Last Hydrocodone  prescription filled on 12/17/2017. Hydrocodone e-scribed today to accommodate scheduled appointment.

## 2018-01-12 NOTE — Telephone Encounter (Signed)
error 

## 2018-01-12 NOTE — Telephone Encounter (Signed)
Called and informed patient. 

## 2018-01-13 DIAGNOSIS — Z79899 Other long term (current) drug therapy: Secondary | ICD-10-CM | POA: Diagnosis not present

## 2018-01-13 DIAGNOSIS — I10 Essential (primary) hypertension: Secondary | ICD-10-CM | POA: Diagnosis not present

## 2018-01-13 DIAGNOSIS — M2550 Pain in unspecified joint: Secondary | ICD-10-CM | POA: Diagnosis not present

## 2018-01-13 DIAGNOSIS — G950 Syringomyelia and syringobulbia: Secondary | ICD-10-CM | POA: Diagnosis not present

## 2018-01-13 DIAGNOSIS — Z0000 Encounter for general adult medical examination without abnormal findings: Secondary | ICD-10-CM | POA: Diagnosis not present

## 2018-01-13 DIAGNOSIS — Z6821 Body mass index (BMI) 21.0-21.9, adult: Secondary | ICD-10-CM | POA: Diagnosis not present

## 2018-01-13 DIAGNOSIS — R51 Headache: Secondary | ICD-10-CM | POA: Diagnosis not present

## 2018-01-28 ENCOUNTER — Encounter: Payer: Self-pay | Admitting: Registered Nurse

## 2018-01-28 ENCOUNTER — Encounter: Payer: Medicare Other | Attending: Physical Medicine & Rehabilitation | Admitting: Registered Nurse

## 2018-01-28 VITALS — BP 104/68 | HR 81 | Resp 14 | Ht 67.0 in | Wt 133.0 lb

## 2018-01-28 DIAGNOSIS — R55 Syncope and collapse: Secondary | ICD-10-CM | POA: Diagnosis not present

## 2018-01-28 DIAGNOSIS — R51 Headache: Secondary | ICD-10-CM | POA: Diagnosis not present

## 2018-01-28 DIAGNOSIS — M2550 Pain in unspecified joint: Secondary | ICD-10-CM

## 2018-01-28 DIAGNOSIS — M47892 Other spondylosis, cervical region: Secondary | ICD-10-CM | POA: Insufficient documentation

## 2018-01-28 DIAGNOSIS — K219 Gastro-esophageal reflux disease without esophagitis: Secondary | ICD-10-CM | POA: Insufficient documentation

## 2018-01-28 DIAGNOSIS — M5412 Radiculopathy, cervical region: Secondary | ICD-10-CM

## 2018-01-28 DIAGNOSIS — G894 Chronic pain syndrome: Secondary | ICD-10-CM | POA: Diagnosis not present

## 2018-01-28 DIAGNOSIS — M797 Fibromyalgia: Secondary | ICD-10-CM | POA: Diagnosis not present

## 2018-01-28 DIAGNOSIS — I951 Orthostatic hypotension: Secondary | ICD-10-CM | POA: Diagnosis not present

## 2018-01-28 DIAGNOSIS — Z79891 Long term (current) use of opiate analgesic: Secondary | ICD-10-CM | POA: Diagnosis not present

## 2018-01-28 DIAGNOSIS — K589 Irritable bowel syndrome without diarrhea: Secondary | ICD-10-CM | POA: Diagnosis not present

## 2018-01-28 DIAGNOSIS — R002 Palpitations: Secondary | ICD-10-CM | POA: Diagnosis not present

## 2018-01-28 DIAGNOSIS — R000 Tachycardia, unspecified: Secondary | ICD-10-CM | POA: Insufficient documentation

## 2018-01-28 DIAGNOSIS — M4716 Other spondylosis with myelopathy, lumbar region: Secondary | ICD-10-CM

## 2018-01-28 DIAGNOSIS — K449 Diaphragmatic hernia without obstruction or gangrene: Secondary | ICD-10-CM | POA: Diagnosis not present

## 2018-01-28 DIAGNOSIS — Z86010 Personal history of colonic polyps: Secondary | ICD-10-CM | POA: Insufficient documentation

## 2018-01-28 DIAGNOSIS — Z760 Encounter for issue of repeat prescription: Secondary | ICD-10-CM | POA: Diagnosis not present

## 2018-01-28 DIAGNOSIS — G8929 Other chronic pain: Secondary | ICD-10-CM | POA: Diagnosis not present

## 2018-01-28 DIAGNOSIS — M62838 Other muscle spasm: Secondary | ICD-10-CM | POA: Diagnosis not present

## 2018-01-28 DIAGNOSIS — Z5181 Encounter for therapeutic drug level monitoring: Secondary | ICD-10-CM | POA: Diagnosis not present

## 2018-01-28 DIAGNOSIS — K0889 Other specified disorders of teeth and supporting structures: Secondary | ICD-10-CM | POA: Insufficient documentation

## 2018-01-28 DIAGNOSIS — M5136 Other intervertebral disc degeneration, lumbar region: Secondary | ICD-10-CM | POA: Insufficient documentation

## 2018-01-28 DIAGNOSIS — M542 Cervicalgia: Secondary | ICD-10-CM

## 2018-01-28 DIAGNOSIS — M47812 Spondylosis without myelopathy or radiculopathy, cervical region: Secondary | ICD-10-CM | POA: Diagnosis not present

## 2018-01-28 DIAGNOSIS — M545 Low back pain: Secondary | ICD-10-CM | POA: Insufficient documentation

## 2018-01-28 DIAGNOSIS — F419 Anxiety disorder, unspecified: Secondary | ICD-10-CM | POA: Insufficient documentation

## 2018-01-28 DIAGNOSIS — K648 Other hemorrhoids: Secondary | ICD-10-CM | POA: Diagnosis not present

## 2018-01-28 DIAGNOSIS — Z599 Problem related to housing and economic circumstances, unspecified: Secondary | ICD-10-CM | POA: Diagnosis not present

## 2018-01-28 DIAGNOSIS — F329 Major depressive disorder, single episode, unspecified: Secondary | ICD-10-CM | POA: Insufficient documentation

## 2018-01-28 DIAGNOSIS — Z87891 Personal history of nicotine dependence: Secondary | ICD-10-CM | POA: Diagnosis not present

## 2018-01-28 DIAGNOSIS — K222 Esophageal obstruction: Secondary | ICD-10-CM | POA: Diagnosis not present

## 2018-01-28 DIAGNOSIS — M47814 Spondylosis without myelopathy or radiculopathy, thoracic region: Secondary | ICD-10-CM

## 2018-01-28 MED ORDER — HYDROCODONE-ACETAMINOPHEN 7.5-325 MG PO TABS: 2.0000 | ORAL_TABLET | Freq: Three times a day (TID) | ORAL | 0 refills | Status: DC | PRN

## 2018-01-28 NOTE — Progress Notes (Signed)
Subjective:    Patient ID: [name], female    DOB: 01/18/1976, 42 y.o.   MRN: [id number]  HPI: [name] is a 42 year old female who returns for follow up appointment for chronic pain and medication refill. She states her pain is located in her neck and lower back, also reports she has a headache today. She rates her pain 6. Her current exercise regime is walking.   [name] is 45.00 MME. She is also prescribed Clonazepam by Dr. Collene Mares..We have discussed the black box warning of using opioids and benzodiazepines. I highlighted the dangers of using these drugs together and discussed the adverse events including respiratory suppression, overdose, cognitive impairment and importance of compliance with current regimen. We will continue to monitor and adjust as indicated. Lat UDS was Performed on 10/18/2017, it was consistent.   Pain Inventory Average Pain 7 Pain Right Now 6 My pain is intermittent, sharp, burning, dull, stabbing, tingling and aching  In the last 24 hours, has pain interfered with the following? General activity 8 Relation with others 7 Enjoyment of life 7 What TIME of day is your pain at its worst? morning, evening, night Sleep (in general) Fair  Pain is worse with: walking, bending, sitting, inactivity and standing Pain improves with: rest, heat/ice, medication and TENS Relief from Meds: 6  Mobility walk with assistance how many minutes can you walk? 20-25 ability to climb steps?  yes do you drive?  yes needs help with transfers transfers alone  Function disabled: date disabled . I need assistance with the following:  meal prep, household duties and shopping  Neuro/Psych weakness numbness tingling spasms  Prior Studies Any changes since last visit?  no  Physicians involved in your care Any changes since last visit?  no   Family History  Problem Relation Age of Onset  . Diabetes type II Mother   . Colon  polyps Mother   . Diverticulitis Mother   . Diabetes Mother   . Heart disease Mother   . Osteoporosis Father   . Diabetes type II Brother   . Uterine cancer Unknown   . Ovarian cancer Unknown   . COPD Paternal Grandfather   . Heart attack Maternal Grandmother    Social History   Socioeconomic History  . Marital status: Married    Spouse name: Not on file  . Number of children: 2  . Years of education: Not on file  . Highest education level: Not on file  Occupational History  . Occupation: Disability    Employer: UNEMPLOYED  Social Needs  . Financial resource strain: Not on file  . Food insecurity:    Worry: Not on file    Inability: Not on file  . Transportation needs:    Medical: Not on file    Non-medical: Not on file  Tobacco Use  . Smoking status: Former Smoker    Types: Cigarettes    Last attempt to quit: 06/30/2011    Years since quitting: 6.5  . Smokeless tobacco: Never Used  Substance and Sexual Activity  . Alcohol use: No    Alcohol/week: 0.0 oz  . Drug use: No  . Sexual activity: Yes  Lifestyle  . Physical activity:    Days per week: Not on file    Minutes per session: Not on file  . Stress: Not on file  Relationships  . Social connections:    Talks on phone: Not on file  Gets together: Not on file    Attends religious service: Not on file    Active member of club or organization: Not on file    Attends meetings of clubs or organizations: Not on file    Relationship status: Not on file  Other Topics Concern  . Not on file  Social History Narrative  . Not on file   Past Surgical History:  Procedure Laterality Date  . CESAREAN SECTION    . Left elbow surgery    . PARTIAL HYSTERECTOMY    . TONSILLECTOMY     Past Medical History:  Diagnosis Date  . Anxiety   . Chronic pain    Dr. Letta Pate  . Degenerative disc disease    Spinal, small syrinx  . Depression   . Esophageal stricture    Status post dilatation  . Fibromyalgia   . GERD  (gastroesophageal reflux disease)   . Hiatal hernia   . Internal hemorrhoids without mention of complication   . Irritable bowel syndrome   . Palpitations    No documented arrhythmias  . Personal history of colonic polyps 10/04/2000   HYPERPLASTIC POLYP  . POTS (postural orthostatic tachycardia syndrome)   . Syncope    Negative tilt table test  . Syringomyelia and syringobulbia (HCC)    BP 104/68 (BP Location: Right Arm, Patient Position: Sitting, Cuff Size: Normal)   Pulse 81   Resp 14   Ht 5' 7"  (1.702 m)   Wt 133 lb (60.3 kg)   SpO2 97%   BMI 20.83 kg/m   Opioid Risk Score:   Fall Risk Score:  `1  Depression screen PHQ 2/9  Depression screen [facility] 2/9 09/20/2017 07/22/2017 05/24/2017 05/26/2016 02/24/2016 04/23/2015 03/26/2015  Decreased Interest 1 1 1 1 1 1 1   Down, Depressed, Hopeless 1 1 1 1 1 1 1   PHQ - 2 Score 2 2 2 2 2 2 2   Altered sleeping - - - - - - -  Tired, decreased energy - - - - - - -  Change in appetite - - - - - - -  Feeling bad or failure about yourself  - - - - - - -  Trouble concentrating - - - - - - -  Moving slowly or fidgety/restless - - - - - - -  Suicidal thoughts - - - - - - -  PHQ-9 Score - - - - - - -    Review of Systems  Constitutional: Positive for chills and unexpected weight change.  HENT: Negative.   Eyes: Negative.   Respiratory: Negative.   Cardiovascular: Negative.   Gastrointestinal: Negative.   Endocrine: Negative.   Musculoskeletal: Positive for arthralgias, back pain, myalgias, neck pain and neck stiffness.       Spasms   Skin: Negative.   Allergic/Immunologic: Negative.   Neurological: Positive for weakness, numbness and headaches.       Tingling   Hematological: Negative.   Psychiatric/Behavioral: Negative.        Objective:   Physical Exam  Constitutional: She is oriented to person, place, and time. She appears well-developed and well-nourished.  HENT:  Head: Normocephalic and atraumatic.  Neck: Normal range of  motion. Neck supple.  Cervical Paraspinal Tenderness: C-5-C-6  Cardiovascular: Normal rate and regular rhythm.  Pulmonary/Chest: Effort normal and breath sounds normal.  Musculoskeletal:  Normal Muscle Bulk and Muscle Testing Reveals: Upper Extremities: Full ROM and Muscle Strength 5/5 Lumbar Paraspinal Tenderness: L-3-L-5 Lower Extremities: Full ROM and Muscle Strength  5/5 Arises from Table with Ease Narrow Based Gait  Neurological: She is alert and oriented to person, place, and time.  Skin: Skin is warm and dry.  Psychiatric: She has a normal mood and affect. Her behavior is normal.  Nursing note and vitals reviewed.         Assessment & Plan:  1. Lumbar degenerative disc disease: 01/28/2018 Continue with current treatment regimen:Refilled:HYDROcodone 7.5/325 mg two tablets every 8 hours as needed #180. We will continue the opioid monitoring program, this consists of regular clinic visits, examinations, urine drug screen, pill counts as well as use of [place] Controlled Substance Reporting System.  2. Thoracic Spondylosis: Continuecurrent treatment withHEP and current medication regime. 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 Lyrica discontinued due to adverse effects. Continue with exercise and heat therapy. 01/28/2018 4. Fibromyalgia:Continue with Heat and exerciseRegimen Lyrica discontinued, Adverse Effects.01/28/2018 5.Depression and anxiety: PCP Following. 01/28/2018 6.Orthostatic hypotension: Cardiology Following. 01/28/2018 7. Polyarthralgia: Continuecurrent treatment withMeloxicam. 01/28/2018 8. Chronic Shoulder Pain: Continue current medication regime. Continue HEP as Tolerated. 01/28/2018. 9. Muscle Spasm: Continue Robaxin. 01/28/2018.  20 minutes of face to face patient care time was spent during this visit. All questions were encouraged and answered.  F/U  in 1 month

## 2018-02-11 ENCOUNTER — Encounter: Payer: Medicare Other | Attending: Physical Medicine & Rehabilitation

## 2018-02-11 ENCOUNTER — Ambulatory Visit: Payer: Medicare Other | Admitting: Physical Medicine & Rehabilitation

## 2018-02-11 ENCOUNTER — Encounter: Payer: Self-pay | Admitting: Physical Medicine & Rehabilitation

## 2018-02-11 VITALS — BP 108/63 | HR 90 | Resp 14 | Ht 67.0 in | Wt 132.0 lb

## 2018-02-11 DIAGNOSIS — I951 Orthostatic hypotension: Secondary | ICD-10-CM | POA: Insufficient documentation

## 2018-02-11 DIAGNOSIS — Z599 Problem related to housing and economic circumstances, unspecified: Secondary | ICD-10-CM | POA: Insufficient documentation

## 2018-02-11 DIAGNOSIS — M797 Fibromyalgia: Secondary | ICD-10-CM | POA: Insufficient documentation

## 2018-02-11 DIAGNOSIS — R002 Palpitations: Secondary | ICD-10-CM | POA: Insufficient documentation

## 2018-02-11 DIAGNOSIS — K589 Irritable bowel syndrome without diarrhea: Secondary | ICD-10-CM | POA: Insufficient documentation

## 2018-02-11 DIAGNOSIS — F419 Anxiety disorder, unspecified: Secondary | ICD-10-CM | POA: Diagnosis not present

## 2018-02-11 DIAGNOSIS — Z87891 Personal history of nicotine dependence: Secondary | ICD-10-CM | POA: Insufficient documentation

## 2018-02-11 DIAGNOSIS — K222 Esophageal obstruction: Secondary | ICD-10-CM | POA: Diagnosis not present

## 2018-02-11 DIAGNOSIS — R55 Syncope and collapse: Secondary | ICD-10-CM | POA: Diagnosis not present

## 2018-02-11 DIAGNOSIS — M4716 Other spondylosis with myelopathy, lumbar region: Secondary | ICD-10-CM | POA: Diagnosis not present

## 2018-02-11 DIAGNOSIS — K449 Diaphragmatic hernia without obstruction or gangrene: Secondary | ICD-10-CM | POA: Diagnosis not present

## 2018-02-11 DIAGNOSIS — K648 Other hemorrhoids: Secondary | ICD-10-CM | POA: Insufficient documentation

## 2018-02-11 DIAGNOSIS — R000 Tachycardia, unspecified: Secondary | ICD-10-CM | POA: Diagnosis not present

## 2018-02-11 DIAGNOSIS — Z86010 Personal history of colonic polyps: Secondary | ICD-10-CM | POA: Diagnosis not present

## 2018-02-11 DIAGNOSIS — M542 Cervicalgia: Secondary | ICD-10-CM | POA: Insufficient documentation

## 2018-02-11 DIAGNOSIS — R51 Headache: Secondary | ICD-10-CM | POA: Diagnosis not present

## 2018-02-11 DIAGNOSIS — K219 Gastro-esophageal reflux disease without esophagitis: Secondary | ICD-10-CM | POA: Insufficient documentation

## 2018-02-11 DIAGNOSIS — G8929 Other chronic pain: Secondary | ICD-10-CM

## 2018-02-11 DIAGNOSIS — M545 Low back pain: Secondary | ICD-10-CM | POA: Diagnosis not present

## 2018-02-11 DIAGNOSIS — K0889 Other specified disorders of teeth and supporting structures: Secondary | ICD-10-CM | POA: Insufficient documentation

## 2018-02-11 DIAGNOSIS — S76312A Strain of muscle, fascia and tendon of the posterior muscle group at thigh level, left thigh, initial encounter: Secondary | ICD-10-CM | POA: Diagnosis not present

## 2018-02-11 DIAGNOSIS — Z760 Encounter for issue of repeat prescription: Secondary | ICD-10-CM | POA: Insufficient documentation

## 2018-02-11 DIAGNOSIS — M5136 Other intervertebral disc degeneration, lumbar region: Secondary | ICD-10-CM | POA: Insufficient documentation

## 2018-02-11 DIAGNOSIS — F329 Major depressive disorder, single episode, unspecified: Secondary | ICD-10-CM | POA: Insufficient documentation

## 2018-02-11 DIAGNOSIS — M47892 Other spondylosis, cervical region: Secondary | ICD-10-CM | POA: Diagnosis not present

## 2018-02-11 MED ORDER — HYDROCODONE-ACETAMINOPHEN 7.5-325 MG PO TABS: 2.0000 | ORAL_TABLET | Freq: Three times a day (TID) | ORAL | 0 refills | Status: DC | PRN

## 2018-02-11 NOTE — Progress Notes (Signed)
Subjective:    Patient ID: [name], female    DOB: 1976/04/06, 42 y.o.   MRN: [id number]  HPI   42 year old female with history of lumbar and thoracic spondylosis.  She also has fibromyalgia syndrome.  She has been on chronic narcotic analgesics over the last several years.  Her dose has been hydrocodone 15 mg 3 times daily.  She was started on Lyrica 50 mg 3 times daily then went up to 75 mg twice a day and 50 mg once a day.  At the same time her hydrocodone was reduced to 10 mg 3 times per day the patient states that her pain scores improved but her husband told her that her mood was very irritable.  The patient was not aware of this.  After her Lyrica was discontinued nurse practitioner increase the hydrocodone back to 7.5 mg 2 tablets 3 times per day.  Takes ~2 Klonopin a week, the patient has been prescribed 90 tablets/month.  We discussed what she does with all the extra medications and she thinks it is in her medication cabinet.  She states that her husband picks up from medications for her at the pharmacy each month. We discussed the negative interaction between opioids and medications such as Klonopin. We also discussed other antianxiety medications.  Given that she only takes 2 Klonopin per week she should not have any withdrawals if she was switched. Specifically talked about hydroxyzine or buspirone.  Also talked about Cymbalta and similar medications Forgot to take Cymbalta until this week.  We discussed the purpose of the Cymbalta as helping with fibromyalgia pain as well as with her anxiety. In the past she has seen a psychiatrist Dr. Rosine Door but states that because of insurance reasons she no longer does so. Patient sees her primary care physician Dr. Gerarda Fraction  Patient also complains of a reddened area on the left forearm.  Denies any trauma to that area denies any insect bites.  She does not think she rubbed it or hit it on anything.  She denies any repetitive motion.  She does  help care for her mother who is a bilateral amputee. Pain Inventory Average Pain 7 Pain Right Now 7 My pain is sharp, burning, dull, stabbing, tingling and aching  In the last 24 hours, has pain interfered with the following? General activity 7 Relation with others 7 Enjoyment of life 8 What TIME of day is your pain at its worst? morning, evening, night Sleep (in general) Fair  Pain is worse with: walking, bending, sitting, inactivity, standing and some activites Pain improves with: rest, heat/ice, medication and TENS Relief from Meds: 6  Mobility walk without assistance  Function disabled: date disabled .  Neuro/Psych numbness tingling  Prior Studies Any changes since last visit?  no  Physicians involved in your care Any changes since last visit?  no   Family History  Problem Relation Age of Onset  . Diabetes type II Mother   . Colon polyps Mother   . Diverticulitis Mother   . Diabetes Mother   . Heart disease Mother   . Osteoporosis Father   . Diabetes type II Brother   . Uterine cancer Unknown   . Ovarian cancer Unknown   . COPD Paternal Grandfather   . Heart attack Maternal Grandmother    Social History   Socioeconomic History  . Marital status: Married    Spouse name: Not on file  . Number of children: 2  . Years of education: Not  on file  . Highest education level: Not on file  Occupational History  . Occupation: Disability    Employer: UNEMPLOYED  Social Needs  . Financial resource strain: Not on file  . Food insecurity:    Worry: Not on file    Inability: Not on file  . Transportation needs:    Medical: Not on file    Non-medical: Not on file  Tobacco Use  . Smoking status: Former Smoker    Types: Cigarettes    Last attempt to quit: 06/30/2011    Years since quitting: 6.6  . Smokeless tobacco: Never Used  Substance and Sexual Activity  . Alcohol use: No    Alcohol/week: 0.0 oz  . Drug use: No  . Sexual activity: Yes  Lifestyle  .  Physical activity:    Days per week: Not on file    Minutes per session: Not on file  . Stress: Not on file  Relationships  . Social connections:    Talks on phone: Not on file    Gets together: Not on file    Attends religious service: Not on file    Active member of club or organization: Not on file    Attends meetings of clubs or organizations: Not on file    Relationship status: Not on file  Other Topics Concern  . Not on file  Social History Narrative  . Not on file   Past Surgical History:  Procedure Laterality Date  . CESAREAN SECTION    . Left elbow surgery    . PARTIAL HYSTERECTOMY    . TONSILLECTOMY     Past Medical History:  Diagnosis Date  . Anxiety   . Chronic pain    Dr. Letta Pate  . Degenerative disc disease    Spinal, small syrinx  . Depression   . Esophageal stricture    Status post dilatation  . Fibromyalgia   . GERD (gastroesophageal reflux disease)   . Hiatal hernia   . Internal hemorrhoids without mention of complication   . Irritable bowel syndrome   . Palpitations    No documented arrhythmias  . Personal history of colonic polyps 10/04/2000   HYPERPLASTIC POLYP  . POTS (postural orthostatic tachycardia syndrome)   . Syncope    Negative tilt table test  . Syringomyelia and syringobulbia (HCC)    BP 108/63 (BP Location: Left Arm, Patient Position: Sitting, Cuff Size: Normal)   Pulse 90   Resp 14   Ht 5' 7"  (1.702 m)   Wt 132 lb (59.9 kg)   SpO2 98%   BMI 20.67 kg/m   Opioid Risk Score:   Fall Risk Score:  `1  Depression screen PHQ 2/9  Depression screen [facility] 2/9 09/20/2017 07/22/2017 05/24/2017 05/26/2016 02/24/2016 04/23/2015 03/26/2015  Decreased Interest 1 1 1 1 1 1 1   Down, Depressed, Hopeless 1 1 1 1 1 1 1   PHQ - 2 Score 2 2 2 2 2 2 2   Altered sleeping - - - - - - -  Tired, decreased energy - - - - - - -  Change in appetite - - - - - - -  Feeling bad or failure about yourself  - - - - - - -  Trouble concentrating - - - - - - -    Moving slowly or fidgety/restless - - - - - - -  Suicidal thoughts - - - - - - -  PHQ-9 Score - - - - - - -  Some recent  data might be hidden    Review of Systems  Constitutional: Negative.   HENT: Negative.   Eyes: Negative.   Respiratory: Negative.   Cardiovascular: Negative.   Gastrointestinal: Negative.   Endocrine: Negative.   Genitourinary: Negative.   Musculoskeletal: Positive for arthralgias, back pain, myalgias and neck pain.  Skin: Negative.   Allergic/Immunologic: Negative.   Neurological: Positive for numbness and headaches.       Tingling   Hematological: Negative.   Psychiatric/Behavioral: Negative.        Objective:   Physical Exam  Well-developed well-nourished female in no acute distress Mood and affect are labile and anxious. Motor strength is 5/5 bilateral deltoid, bicep, tricep, grip, hip flexion, knee extension, ankle dorsiflexor Negative straight leg raising Lumbar range of motion is 100% lection 75% extension 50% lateral bending and rotation. Gait is without evidence of toe drag or knee instability she uses no assistive device. Hip knee ankle range of motion is normal. Left wrist forearm has minimal erythema over thumb extensor tendon proximally 10 cm proximal to the wrist.  There is no pain with range of motion no bruising.  No masses palpable. Lumbar spine is no tenderness palpation There is some mild tenderness around the trapezius upper back area as well as thoracic paraspinals. No tenderness over the sternocostal area over the lateral epicondyles or medial knee.    Assessment & Plan:  #1.  Lumbar and thoracic spondylosis.  She also has a history of a small syrinx in the cervical spine as well as a small syrinx in thoracic spine.  She has no focal neurologic findings. Her primary has written for repeat MRI.  Patient has not followed up with neurosurgery   For a number of years.  I do not think the syrinx is the cause of her pain  however  Indication for chronic opioid: See above Medication and dose: Hydrocodone 50 mg 3 times daily # pills per month: 180 Last UDS date: 10/18/2017 Opioid Treatment Agreement signed (Y/N): Yes Opioid Treatment Agreement last reviewed with patient:   [facility] reviewed this encounter (include red flags): 02/11/2018  2.  Chronic pain syndrome she does have fibromyalgia syndrome and I think she does need to be an agent for this.  Would recommend Cymbalta 20 mg p.o. daily.  If she takes this for a month and still has no improvement with her widespread body pain I would increase it to 30 mg/day.  Nurse practitioner visit 1 month to further reassess. 3.  Anxiety with fixation upon her physical ailments.  We will ask neuropsychology to evaluate.  I am concerned about how many Klonopin tablets she is getting filled each month and how little she is really taking.  Will notify her primary care physician Dr. Gerarda Fraction who is prescribing this medication.  Patient states she only takes about 2 tablets/week if she responds.  Favorably to the Cymbalta she may not need any at all.  I do not think if she is taking just 2/wk that she would have any withdrawal.

## 2018-02-11 NOTE — Patient Instructions (Addendum)
Hamstring Strain Rehab Ask your health care provider which exercises are safe for you. Do exercises exactly as told by your health care provider and adjust them as directed. It is normal to feel mild stretching, pulling, tightness, or discomfort as you do these exercises, but you should stop right away if you feel sudden pain or your pain gets worse.Do not begin these exercises until told by your health care provider. Strengthening exercises These exercises build strength and endurance in your thighs. Endurance is the ability to use your muscles for a long time, even after your muscles get tired. Exercise A: Straight leg raises ( hip extensors) 1. Lie on your belly on a firm surface. 2. Tense the muscles in your buttocks to lift your left / right leg about 4 inches (10 cm). Keep your knee straight. 3. If you cannot lift your leg this high without arching your back, place a pillow under your hips. 4. Hold the position for __________ seconds. 5. Slowly lower your leg to the starting position and allow it to relax completely before you start the next repetition. Repeat __________ times. Complete this exercise __________ times a day. Exercise B: Bridge ( hip extensors) 1. Lie on your back on a firm surface with your knees bent and your feet flat on the floor. 2. Tighten your buttocks muscles and lift your bottom off the floor until your trunk is level with your thighs. ? You should feel the muscles working in your buttocks and the back of your thighs. ? Do not arch your back. 3. Hold this position for __________ seconds. 4. Slowly lower your hips to the starting position. 5. Let your buttocks muscles relax completely between repetitions. Repeat __________ times. Complete this exercise __________ times a day. If told by your health care provider, keep your bottom lifted off the floor while you slowly walk your feet away from you as far as you can control. Hold for __________ seconds, then slowly  walk your feet back toward you. Exercise C: Lateral walking with band ( hip abductors) 1. Stand in a long hallway. 2. Wrap a loop of exercise band around your legs, just above your knees. 3. Bend your knees gently and drop your hips down and back so your weight is over your heels. 4. Step to the side to move down the length of the hallway, keeping your toes pointed ahead of you and keeping tension in the band. 5. Repeat, leading with your other leg. Repeat __________ times. Complete this exercise __________ times a day. Exercise D: Single leg stand with reaching ( eccentric hamstring) 1. Stand on your left / right foot. Keep your big toe down on the floor and try to keep your arch lifted. 2. Slowly reach down toward the floor as far as you can while keeping your balance. 3. Hold this position for __________ seconds. Repeat __________ times. Complete this exercise __________ times a day. Exercise E: Prone plank ( abdominals and core) 1. Lie on your belly on the floor and prop yourself up on your elbows. Your hands should be straight out in front of you, and your elbows should be below your shoulders. Position your feet so the balls of your feet touch the ground. The ball of your foot is on the walking surface, right under your toes. 2. Tighten your abdominal muscles and lift your body off the floor. ? Do not arch your back. ? Do not hold your breath. 3. Hold this position for __________ seconds. Repeat __________  times. Complete this exercise __________ times a day. This information is not intended to replace advice given to you by your health care provider. Make sure you discuss any questions you have with your health care provider. Document Released: 07/20/2005 Document Revised: 03/26/2016 Document Reviewed: 04/18/2015 Elsevier Interactive Patient Education  2018 [place] CYMBALTA 20MG DAILY FOR ANXIETY AND FIBROMYALGIA PAIN.  EUNICE MAY INCREASE TO 30MG NEXT MONTH  IF NEEDED

## 2018-02-14 ENCOUNTER — Telehealth: Payer: Self-pay | Admitting: *Deleted

## 2018-02-14 NOTE — Telephone Encounter (Signed)
Melanie in Dr Fusco's office was returning Bruces call- no other information left.

## 2018-02-14 NOTE — Telephone Encounter (Signed)
Called Drs office in reguards to this patients medication and needing further information from her primary care provicer, according to Dr.s last note.  3.  Anxiety with fixation upon her physical ailments.  We will ask neuropsychology to evaluate.  I am concerned about how many Klonopin tablets she is getting filled each month and how little she is really taking.  Will notify her primary care physician Dr. Gerarda Fraction who is prescribing this medication.  Patient states she only takes about 2 tablets/week if she responds.  Favorably to the Cymbalta she may not need any at all.  I do not think if she is taking just 2/wk that she would have any withdrawal.

## 2018-02-15 ENCOUNTER — Ambulatory Visit: Payer: Medicare Other | Admitting: Physical Medicine & Rehabilitation

## 2018-02-15 NOTE — Telephone Encounter (Signed)
Was able to reach nursing staff at Dr. Nevin Bloodgood office, he will not be in the clinic again until next week.

## 2018-03-11 ENCOUNTER — Encounter: Payer: Medicare Other | Attending: Physical Medicine & Rehabilitation | Admitting: Registered Nurse

## 2018-03-11 ENCOUNTER — Encounter: Payer: Medicare Other | Admitting: Registered Nurse

## 2018-03-11 DIAGNOSIS — M47892 Other spondylosis, cervical region: Secondary | ICD-10-CM | POA: Insufficient documentation

## 2018-03-11 DIAGNOSIS — I951 Orthostatic hypotension: Secondary | ICD-10-CM | POA: Insufficient documentation

## 2018-03-11 DIAGNOSIS — M5136 Other intervertebral disc degeneration, lumbar region: Secondary | ICD-10-CM | POA: Insufficient documentation

## 2018-03-11 DIAGNOSIS — M542 Cervicalgia: Secondary | ICD-10-CM | POA: Insufficient documentation

## 2018-03-11 DIAGNOSIS — R51 Headache: Secondary | ICD-10-CM | POA: Insufficient documentation

## 2018-03-11 DIAGNOSIS — Z87891 Personal history of nicotine dependence: Secondary | ICD-10-CM | POA: Insufficient documentation

## 2018-03-11 DIAGNOSIS — K0889 Other specified disorders of teeth and supporting structures: Secondary | ICD-10-CM | POA: Insufficient documentation

## 2018-03-11 DIAGNOSIS — G8929 Other chronic pain: Secondary | ICD-10-CM | POA: Insufficient documentation

## 2018-03-11 DIAGNOSIS — K222 Esophageal obstruction: Secondary | ICD-10-CM | POA: Insufficient documentation

## 2018-03-11 DIAGNOSIS — R002 Palpitations: Secondary | ICD-10-CM | POA: Insufficient documentation

## 2018-03-11 DIAGNOSIS — Z599 Problem related to housing and economic circumstances, unspecified: Secondary | ICD-10-CM | POA: Insufficient documentation

## 2018-03-11 DIAGNOSIS — R000 Tachycardia, unspecified: Secondary | ICD-10-CM | POA: Insufficient documentation

## 2018-03-11 DIAGNOSIS — F419 Anxiety disorder, unspecified: Secondary | ICD-10-CM | POA: Insufficient documentation

## 2018-03-11 DIAGNOSIS — R55 Syncope and collapse: Secondary | ICD-10-CM | POA: Insufficient documentation

## 2018-03-11 DIAGNOSIS — M545 Low back pain: Secondary | ICD-10-CM | POA: Insufficient documentation

## 2018-03-11 DIAGNOSIS — K648 Other hemorrhoids: Secondary | ICD-10-CM | POA: Insufficient documentation

## 2018-03-11 DIAGNOSIS — K589 Irritable bowel syndrome without diarrhea: Secondary | ICD-10-CM | POA: Insufficient documentation

## 2018-03-11 DIAGNOSIS — Z86010 Personal history of colonic polyps: Secondary | ICD-10-CM | POA: Insufficient documentation

## 2018-03-11 DIAGNOSIS — K449 Diaphragmatic hernia without obstruction or gangrene: Secondary | ICD-10-CM | POA: Insufficient documentation

## 2018-03-11 DIAGNOSIS — M797 Fibromyalgia: Secondary | ICD-10-CM | POA: Insufficient documentation

## 2018-03-11 DIAGNOSIS — K219 Gastro-esophageal reflux disease without esophagitis: Secondary | ICD-10-CM | POA: Insufficient documentation

## 2018-03-11 DIAGNOSIS — Z760 Encounter for issue of repeat prescription: Secondary | ICD-10-CM | POA: Insufficient documentation

## 2018-03-11 DIAGNOSIS — F329 Major depressive disorder, single episode, unspecified: Secondary | ICD-10-CM | POA: Insufficient documentation

## 2018-03-15 ENCOUNTER — Other Ambulatory Visit: Payer: Self-pay

## 2018-03-15 NOTE — Telephone Encounter (Signed)
I am willing to prescribe Hydrocodone 47m po QID #120 [facility]

## 2018-03-15 NOTE — Telephone Encounter (Addendum)
Patient called today, stated that she was unable to make her last appointment with Zella Ball due to her being out of the area and taking care of her mother that day as well as being under great stress at the time.  She already has another appointment set up to see Zella Ball but it is on 04-15-2018 and she is out of her medication as of today.  Verified PMP:  Fill Date    Written       Drug                                              Qty     Days  Prescriber 02/11/2018 01/28/2018 Hydrocodone-Acetamin 7.5-325 180.00     80    Marcello Moores, E  Patient states uses [facility] and was counseled about missing appointments.  States she will make sure to make her next appointment in September

## 2018-03-15 NOTE — Telephone Encounter (Signed)
Only the provider can do the E-scribe on opioid and controlled medications, I have started the process thou.

## 2018-03-16 MED ORDER — HYDROCODONE-ACETAMINOPHEN 7.5-325 MG PO TABS: 2.0000 | ORAL_TABLET | Freq: Three times a day (TID) | ORAL | 0 refills | Status: DC | PRN

## 2018-03-16 NOTE — Telephone Encounter (Signed)
Appointment made for 03-21-18

## 2018-03-16 NOTE — Telephone Encounter (Signed)
Needs to see Danella Sensing NP in 1 wk

## 2018-03-21 ENCOUNTER — Encounter: Payer: Medicare Other | Admitting: Registered Nurse

## 2018-03-21 ENCOUNTER — Encounter: Payer: Self-pay | Admitting: Registered Nurse

## 2018-03-21 VITALS — BP 119/77 | HR 96 | Ht 67.0 in | Wt 136.0 lb

## 2018-03-21 DIAGNOSIS — Z760 Encounter for issue of repeat prescription: Secondary | ICD-10-CM | POA: Diagnosis not present

## 2018-03-21 DIAGNOSIS — K589 Irritable bowel syndrome without diarrhea: Secondary | ICD-10-CM | POA: Diagnosis not present

## 2018-03-21 DIAGNOSIS — K0889 Other specified disorders of teeth and supporting structures: Secondary | ICD-10-CM | POA: Diagnosis not present

## 2018-03-21 DIAGNOSIS — Z5181 Encounter for therapeutic drug level monitoring: Secondary | ICD-10-CM | POA: Diagnosis not present

## 2018-03-21 DIAGNOSIS — M5412 Radiculopathy, cervical region: Secondary | ICD-10-CM | POA: Diagnosis not present

## 2018-03-21 DIAGNOSIS — M545 Low back pain: Secondary | ICD-10-CM | POA: Diagnosis not present

## 2018-03-21 DIAGNOSIS — Z86010 Personal history of colonic polyps: Secondary | ICD-10-CM | POA: Diagnosis not present

## 2018-03-21 DIAGNOSIS — M542 Cervicalgia: Secondary | ICD-10-CM

## 2018-03-21 DIAGNOSIS — F329 Major depressive disorder, single episode, unspecified: Secondary | ICD-10-CM | POA: Diagnosis not present

## 2018-03-21 DIAGNOSIS — R000 Tachycardia, unspecified: Secondary | ICD-10-CM | POA: Diagnosis not present

## 2018-03-21 DIAGNOSIS — K222 Esophageal obstruction: Secondary | ICD-10-CM | POA: Diagnosis not present

## 2018-03-21 DIAGNOSIS — M797 Fibromyalgia: Secondary | ICD-10-CM | POA: Diagnosis not present

## 2018-03-21 DIAGNOSIS — M4716 Other spondylosis with myelopathy, lumbar region: Secondary | ICD-10-CM

## 2018-03-21 DIAGNOSIS — M47814 Spondylosis without myelopathy or radiculopathy, thoracic region: Secondary | ICD-10-CM

## 2018-03-21 DIAGNOSIS — G8929 Other chronic pain: Secondary | ICD-10-CM

## 2018-03-21 DIAGNOSIS — Z79899 Other long term (current) drug therapy: Secondary | ICD-10-CM

## 2018-03-21 DIAGNOSIS — Z87891 Personal history of nicotine dependence: Secondary | ICD-10-CM | POA: Diagnosis not present

## 2018-03-21 DIAGNOSIS — M2550 Pain in unspecified joint: Secondary | ICD-10-CM

## 2018-03-21 DIAGNOSIS — M5136 Other intervertebral disc degeneration, lumbar region: Secondary | ICD-10-CM | POA: Diagnosis not present

## 2018-03-21 DIAGNOSIS — M25511 Pain in right shoulder: Secondary | ICD-10-CM

## 2018-03-21 DIAGNOSIS — R002 Palpitations: Secondary | ICD-10-CM | POA: Diagnosis not present

## 2018-03-21 DIAGNOSIS — K219 Gastro-esophageal reflux disease without esophagitis: Secondary | ICD-10-CM | POA: Diagnosis not present

## 2018-03-21 DIAGNOSIS — R51 Headache: Secondary | ICD-10-CM | POA: Diagnosis not present

## 2018-03-21 DIAGNOSIS — R55 Syncope and collapse: Secondary | ICD-10-CM | POA: Diagnosis not present

## 2018-03-21 DIAGNOSIS — I951 Orthostatic hypotension: Secondary | ICD-10-CM | POA: Diagnosis not present

## 2018-03-21 DIAGNOSIS — K449 Diaphragmatic hernia without obstruction or gangrene: Secondary | ICD-10-CM | POA: Diagnosis not present

## 2018-03-21 DIAGNOSIS — G894 Chronic pain syndrome: Secondary | ICD-10-CM

## 2018-03-21 DIAGNOSIS — M47892 Other spondylosis, cervical region: Secondary | ICD-10-CM | POA: Diagnosis not present

## 2018-03-21 DIAGNOSIS — K648 Other hemorrhoids: Secondary | ICD-10-CM | POA: Diagnosis not present

## 2018-03-21 DIAGNOSIS — M25512 Pain in left shoulder: Secondary | ICD-10-CM

## 2018-03-21 DIAGNOSIS — Z599 Problem related to housing and economic circumstances, unspecified: Secondary | ICD-10-CM | POA: Diagnosis not present

## 2018-03-21 DIAGNOSIS — F419 Anxiety disorder, unspecified: Secondary | ICD-10-CM | POA: Diagnosis not present

## 2018-03-21 MED ORDER — HYDROCODONE-ACETAMINOPHEN 7.5-325 MG PO TABS: 2.0000 | ORAL_TABLET | Freq: Three times a day (TID) | ORAL | 0 refills | Status: DC | PRN

## 2018-03-21 NOTE — Progress Notes (Signed)
Subjective:    Patient ID: [name], female    DOB: November 12, 1975, 42 y.o.   MRN: [id number]  HPI: [name] is a 42 year old female who returns for follow up appointment for chronic pain and medication refill. She states her pain is located in her neck radiating into her bilateral shoulders, right elbow pain and right wrist pain. Also reports she woke up with a headache. She rates her pain 6. Her current exercise regime is walking.  Ms. Chaudhary Morphine Equivalent is 45.00 MME. She is also prescribed Clonazepam  by Collene Mares.We have discussed the black box warning of using opioids and benzodiazepines.. I highlighted the dangers of using these drugs together and discussed the adverse events including respiratory suppression, overdose, cognitive impairment and importance of compliance with current regimen. We will continue to monitor and adjust as indicated.   Last UDS was Performed on 10/18/2017, it was consistent. UDS was ordered today.   Pain Inventory Average Pain 7 Pain Right Now 6 My pain is intermittent, sharp, burning, dull, stabbing, tingling and aching  In the last 24 hours, has pain interfered with the following? General activity 8 Relation with others 8 Enjoyment of life 7 What TIME of day is your pain at its worst? night Sleep (in general) Fair  Pain is worse with: walking, bending, sitting, inactivity, standing, unsure and some activites Pain improves with: rest, heat/ice, therapy/exercise, medication and TENS Relief from Meds: 6  Mobility walk without assistance ability to climb steps?  yes  Function disabled: date disabled . I need assistance with the following:  meal prep, household duties and shopping  Neuro/Psych No problems in this area  Prior Studies Any changes since last visit?  no  Physicians involved in your care Any changes since last visit?  no   Family History  Problem Relation Age of Onset  . Diabetes type II Mother   .  Colon polyps Mother   . Diverticulitis Mother   . Diabetes Mother   . Heart disease Mother   . Osteoporosis Father   . Diabetes type II Brother   . Uterine cancer Unknown   . Ovarian cancer Unknown   . COPD Paternal Grandfather   . Heart attack Maternal Grandmother    Social History   Socioeconomic History  . Marital status: Married    Spouse name: Not on file  . Number of children: 2  . Years of education: Not on file  . Highest education level: Not on file  Occupational History  . Occupation: Disability    Employer: UNEMPLOYED  Social Needs  . Financial resource strain: Not on file  . Food insecurity:    Worry: Not on file    Inability: Not on file  . Transportation needs:    Medical: Not on file    Non-medical: Not on file  Tobacco Use  . Smoking status: Former Smoker    Types: Cigarettes    Last attempt to quit: 06/30/2011    Years since quitting: 6.7  . Smokeless tobacco: Never Used  Substance and Sexual Activity  . Alcohol use: No    Alcohol/week: 0.0 standard drinks  . Drug use: No  . Sexual activity: Yes  Lifestyle  . Physical activity:    Days per week: Not on file    Minutes per session: Not on file  . Stress: Not on file  Relationships  . Social connections:    Talks on phone: Not on file  Gets together: Not on file    Attends religious service: Not on file    Active member of club or organization: Not on file    Attends meetings of clubs or organizations: Not on file    Relationship status: Not on file  Other Topics Concern  . Not on file  Social History Narrative  . Not on file   Past Surgical History:  Procedure Laterality Date  . CESAREAN SECTION    . Left elbow surgery    . PARTIAL HYSTERECTOMY    . TONSILLECTOMY     Past Medical History:  Diagnosis Date  . Anxiety   . Chronic pain    Dr. Letta Pate  . Degenerative disc disease    Spinal, small syrinx  . Depression   . Esophageal stricture    Status post dilatation  .  Fibromyalgia   . GERD (gastroesophageal reflux disease)   . Hiatal hernia   . Internal hemorrhoids without mention of complication   . Irritable bowel syndrome   . Palpitations    No documented arrhythmias  . Personal history of colonic polyps 10/04/2000   HYPERPLASTIC POLYP  . POTS (postural orthostatic tachycardia syndrome)   . Syncope    Negative tilt table test  . Syringomyelia and syringobulbia ([place])    There were no vitals taken for this visit.  Opioid Risk Score:   Fall Risk Score:  `1  Depression screen PHQ 2/9  Depression screen [facility] 2/9 09/20/2017 07/22/2017 05/24/2017 05/26/2016 02/24/2016 04/23/2015 03/26/2015  Decreased Interest 1 1 1 1 1 1 1   Down, Depressed, Hopeless 1 1 1 1 1 1 1   PHQ - 2 Score 2 2 2 2 2 2 2   Altered sleeping - - - - - - -  Tired, decreased energy - - - - - - -  Change in appetite - - - - - - -  Feeling bad or failure about yourself  - - - - - - -  Trouble concentrating - - - - - - -  Moving slowly or fidgety/restless - - - - - - -  Suicidal thoughts - - - - - - -  PHQ-9 Score - - - - - - -  Some recent data might be hidden     Review of Systems  Constitutional: Negative.   HENT: Negative.   Eyes: Negative.   Respiratory: Negative.   Cardiovascular: Negative.   Gastrointestinal: Negative.   Endocrine: Negative.   Genitourinary: Negative.   Musculoskeletal: Positive for arthralgias and myalgias.  Skin: Negative.   Allergic/Immunologic: Negative.   Neurological: Negative.   Hematological: Negative.   Psychiatric/Behavioral: Negative.   All other systems reviewed and are negative.      Objective:   Physical Exam  Constitutional: She is oriented to person, place, and time. She appears well-developed and well-nourished.  HENT:  Head: Normocephalic and atraumatic.  Neck: Normal range of motion. Neck supple.  Cardiovascular: Normal rate and regular rhythm.  Pulmonary/Chest: Effort normal and breath sounds normal.  Musculoskeletal:    Normal Muscle Bulk and Muscle Testing Reveals: Upper Extremities: Full ROM and Muscle Strength 5/5 Bilateral AC Joint Tenderness Lower Extremities: Full ROM and Muscle Strength 5/5 Arises from Table with Ease Narrow Based Gait  Neurological: She is alert and oriented to person, place, and time.  Skin: Skin is warm and dry.  Psychiatric: She has a normal mood and affect. Her behavior is normal.  Nursing note and vitals reviewed.  Assessment & Plan:  1. Lumbar degenerative disc disease: 03/21/2018 Continue with current treatment regimen:Refilled:HYDROcodone 7.5/325 mg two tablets every 8 hours as needed #180. We will continue the opioid monitoring program, this consists of regular clinic visits, examinations, urine drug screen, pill counts as well as use of [place] Controlled Substance Reporting System.  2. Thoracic Spondylosis: Continuecurrent treatment withHEP and current medication regime. 03/21/2018 3 Cervicalgia/ Cervical Radiculitis/ Cervical spondylosis without evidence of myelopathy:Continue to Monitor.. Lyrica discontinued due to adverse effects. Continue with exercise and heat therapy. 03/21/2018 4. Fibromyalgia:Continue with Heat and exerciseRegimen Lyrica discontinued, Adverse Effects.03/21/2018 5.Depression and anxiety: PCP Following. 03/21/2018 6.Orthostatic hypotension: Cardiology Following. 03/21/2018 7. Polyarthralgia: Continuecurrent treatment withMeloxicam. 03/21/2018 8. Chronic Shoulder Pain: Continue current medication regime. Continue HEP as Tolerated. 03/21/2018. 9. Muscle Spasm: Continue Robaxin.03/21/2018.  20 minutes of face to face patient care time was spent during this visit. All questions were encouraged and answered.  F/U in 1 month

## 2018-03-28 LAB — TOXASSURE SELECT,+ANTIDEPR,UR

## 2018-03-29 ENCOUNTER — Telehealth: Payer: Self-pay | Admitting: *Deleted

## 2018-03-29 NOTE — Telephone Encounter (Signed)
Urine drug screen for this encounter is consistent for prescribed medication 

## 2018-04-15 ENCOUNTER — Encounter: Payer: Medicare Other | Admitting: Psychology

## 2018-04-15 ENCOUNTER — Encounter: Payer: Medicare Other | Admitting: Registered Nurse

## 2018-04-19 ENCOUNTER — Encounter: Payer: Medicare Other | Attending: Physical Medicine & Rehabilitation | Admitting: Psychology

## 2018-04-19 DIAGNOSIS — M47892 Other spondylosis, cervical region: Secondary | ICD-10-CM | POA: Diagnosis not present

## 2018-04-19 DIAGNOSIS — G8929 Other chronic pain: Secondary | ICD-10-CM | POA: Diagnosis not present

## 2018-04-19 DIAGNOSIS — F4312 Post-traumatic stress disorder, chronic: Secondary | ICD-10-CM

## 2018-04-19 DIAGNOSIS — F329 Major depressive disorder, single episode, unspecified: Secondary | ICD-10-CM | POA: Diagnosis not present

## 2018-04-19 DIAGNOSIS — Z599 Problem related to housing and economic circumstances, unspecified: Secondary | ICD-10-CM | POA: Diagnosis not present

## 2018-04-19 DIAGNOSIS — M542 Cervicalgia: Secondary | ICD-10-CM | POA: Insufficient documentation

## 2018-04-19 DIAGNOSIS — R002 Palpitations: Secondary | ICD-10-CM | POA: Diagnosis not present

## 2018-04-19 DIAGNOSIS — M4716 Other spondylosis with myelopathy, lumbar region: Secondary | ICD-10-CM

## 2018-04-19 DIAGNOSIS — F419 Anxiety disorder, unspecified: Secondary | ICD-10-CM | POA: Insufficient documentation

## 2018-04-19 DIAGNOSIS — M5136 Other intervertebral disc degeneration, lumbar region: Secondary | ICD-10-CM | POA: Insufficient documentation

## 2018-04-19 DIAGNOSIS — K648 Other hemorrhoids: Secondary | ICD-10-CM | POA: Diagnosis not present

## 2018-04-19 DIAGNOSIS — R51 Headache: Secondary | ICD-10-CM | POA: Insufficient documentation

## 2018-04-19 DIAGNOSIS — K219 Gastro-esophageal reflux disease without esophagitis: Secondary | ICD-10-CM | POA: Diagnosis not present

## 2018-04-19 DIAGNOSIS — I951 Orthostatic hypotension: Secondary | ICD-10-CM | POA: Insufficient documentation

## 2018-04-19 DIAGNOSIS — Z86010 Personal history of colonic polyps: Secondary | ICD-10-CM | POA: Insufficient documentation

## 2018-04-19 DIAGNOSIS — G894 Chronic pain syndrome: Secondary | ICD-10-CM | POA: Diagnosis not present

## 2018-04-19 DIAGNOSIS — F411 Generalized anxiety disorder: Secondary | ICD-10-CM

## 2018-04-19 DIAGNOSIS — Z87891 Personal history of nicotine dependence: Secondary | ICD-10-CM | POA: Diagnosis not present

## 2018-04-19 DIAGNOSIS — R55 Syncope and collapse: Secondary | ICD-10-CM | POA: Insufficient documentation

## 2018-04-19 DIAGNOSIS — K0889 Other specified disorders of teeth and supporting structures: Secondary | ICD-10-CM | POA: Diagnosis not present

## 2018-04-19 DIAGNOSIS — K449 Diaphragmatic hernia without obstruction or gangrene: Secondary | ICD-10-CM | POA: Diagnosis not present

## 2018-04-19 DIAGNOSIS — K589 Irritable bowel syndrome without diarrhea: Secondary | ICD-10-CM | POA: Diagnosis not present

## 2018-04-19 DIAGNOSIS — K222 Esophageal obstruction: Secondary | ICD-10-CM | POA: Insufficient documentation

## 2018-04-19 DIAGNOSIS — Z760 Encounter for issue of repeat prescription: Secondary | ICD-10-CM | POA: Insufficient documentation

## 2018-04-19 DIAGNOSIS — R000 Tachycardia, unspecified: Secondary | ICD-10-CM | POA: Insufficient documentation

## 2018-04-19 DIAGNOSIS — M545 Low back pain: Secondary | ICD-10-CM | POA: Diagnosis not present

## 2018-04-19 DIAGNOSIS — M797 Fibromyalgia: Secondary | ICD-10-CM | POA: Insufficient documentation

## 2018-04-25 ENCOUNTER — Other Ambulatory Visit: Payer: Self-pay

## 2018-04-25 ENCOUNTER — Encounter: Payer: Medicare Other | Admitting: Registered Nurse

## 2018-04-25 ENCOUNTER — Encounter: Payer: Self-pay | Admitting: Registered Nurse

## 2018-04-25 VITALS — BP 131/87 | HR 94 | Ht 66.5 in | Wt 141.0 lb

## 2018-04-25 DIAGNOSIS — K219 Gastro-esophageal reflux disease without esophagitis: Secondary | ICD-10-CM | POA: Diagnosis not present

## 2018-04-25 DIAGNOSIS — Z87891 Personal history of nicotine dependence: Secondary | ICD-10-CM | POA: Diagnosis not present

## 2018-04-25 DIAGNOSIS — M797 Fibromyalgia: Secondary | ICD-10-CM

## 2018-04-25 DIAGNOSIS — M542 Cervicalgia: Secondary | ICD-10-CM | POA: Diagnosis not present

## 2018-04-25 DIAGNOSIS — Z760 Encounter for issue of repeat prescription: Secondary | ICD-10-CM | POA: Diagnosis not present

## 2018-04-25 DIAGNOSIS — M47814 Spondylosis without myelopathy or radiculopathy, thoracic region: Secondary | ICD-10-CM | POA: Diagnosis not present

## 2018-04-25 DIAGNOSIS — M4716 Other spondylosis with myelopathy, lumbar region: Secondary | ICD-10-CM

## 2018-04-25 DIAGNOSIS — R002 Palpitations: Secondary | ICD-10-CM | POA: Diagnosis not present

## 2018-04-25 DIAGNOSIS — K222 Esophageal obstruction: Secondary | ICD-10-CM | POA: Diagnosis not present

## 2018-04-25 DIAGNOSIS — Z5181 Encounter for therapeutic drug level monitoring: Secondary | ICD-10-CM

## 2018-04-25 DIAGNOSIS — I951 Orthostatic hypotension: Secondary | ICD-10-CM | POA: Diagnosis not present

## 2018-04-25 DIAGNOSIS — M25521 Pain in right elbow: Secondary | ICD-10-CM

## 2018-04-25 DIAGNOSIS — M5412 Radiculopathy, cervical region: Secondary | ICD-10-CM

## 2018-04-25 DIAGNOSIS — G8929 Other chronic pain: Secondary | ICD-10-CM | POA: Diagnosis not present

## 2018-04-25 DIAGNOSIS — K648 Other hemorrhoids: Secondary | ICD-10-CM | POA: Diagnosis not present

## 2018-04-25 DIAGNOSIS — K0889 Other specified disorders of teeth and supporting structures: Secondary | ICD-10-CM | POA: Diagnosis not present

## 2018-04-25 DIAGNOSIS — K449 Diaphragmatic hernia without obstruction or gangrene: Secondary | ICD-10-CM | POA: Diagnosis not present

## 2018-04-25 DIAGNOSIS — M545 Low back pain: Secondary | ICD-10-CM | POA: Diagnosis not present

## 2018-04-25 DIAGNOSIS — M2550 Pain in unspecified joint: Secondary | ICD-10-CM

## 2018-04-25 DIAGNOSIS — Z86010 Personal history of colonic polyps: Secondary | ICD-10-CM | POA: Diagnosis not present

## 2018-04-25 DIAGNOSIS — R000 Tachycardia, unspecified: Secondary | ICD-10-CM | POA: Diagnosis not present

## 2018-04-25 DIAGNOSIS — M25531 Pain in right wrist: Secondary | ICD-10-CM

## 2018-04-25 DIAGNOSIS — G894 Chronic pain syndrome: Secondary | ICD-10-CM

## 2018-04-25 DIAGNOSIS — Z599 Problem related to housing and economic circumstances, unspecified: Secondary | ICD-10-CM | POA: Diagnosis not present

## 2018-04-25 DIAGNOSIS — M25511 Pain in right shoulder: Secondary | ICD-10-CM

## 2018-04-25 DIAGNOSIS — R55 Syncope and collapse: Secondary | ICD-10-CM | POA: Diagnosis not present

## 2018-04-25 DIAGNOSIS — M778 Other enthesopathies, not elsewhere classified: Secondary | ICD-10-CM

## 2018-04-25 DIAGNOSIS — M7581 Other shoulder lesions, right shoulder: Secondary | ICD-10-CM

## 2018-04-25 DIAGNOSIS — K589 Irritable bowel syndrome without diarrhea: Secondary | ICD-10-CM | POA: Diagnosis not present

## 2018-04-25 DIAGNOSIS — M5136 Other intervertebral disc degeneration, lumbar region: Secondary | ICD-10-CM | POA: Diagnosis not present

## 2018-04-25 DIAGNOSIS — R51 Headache: Secondary | ICD-10-CM | POA: Diagnosis not present

## 2018-04-25 DIAGNOSIS — Z79899 Other long term (current) drug therapy: Secondary | ICD-10-CM

## 2018-04-25 DIAGNOSIS — M47892 Other spondylosis, cervical region: Secondary | ICD-10-CM | POA: Diagnosis not present

## 2018-04-25 DIAGNOSIS — M25532 Pain in left wrist: Secondary | ICD-10-CM

## 2018-04-25 MED ORDER — HYDROCODONE-ACETAMINOPHEN 7.5-325 MG PO TABS: 2.0000 | ORAL_TABLET | Freq: Three times a day (TID) | ORAL | 0 refills | Status: DC | PRN

## 2018-04-25 NOTE — Progress Notes (Signed)
Subjective:    Patient ID: [name], female    DOB: 12-05-1975, 42 y.o.   MRN: [id number]  HPI: [name] is a 42 year old female who returns for follow up appointment for chronic pain and medication refill.She states her pain is located in her neck, also reports increase intensity of neck pain, right shoulder, right elbow, lower back and joint pain all over. Also reports she has a headache. She rates her pain 5. Her current exercise regime is walking.   [name] Morphine Equivalent is 45.00 MME. She is also prescribed Clonazepam by Dr. Collene Mares. We have discussed the black box warning of using opioids and benzodiazepines. I highlighted the dangers of using these drugs together and discussed the adverse events including respiratory suppression, overdose, cognitive impairment and importance of compliance with current regimen. We will continue to monitor and adjust as indicated.   Last UDS was Performed on 03/21/2018, it was consistent.    Pain Inventory Average Pain 7 Pain Right Now 5 My pain is intermittent, sharp, burning, dull, stabbing, tingling and aching  In the last 24 hours, has pain interfered with the following? General activity 8 Relation with others 8 Enjoyment of life 8 What TIME of day is your pain at its worst? morning evening night Sleep (in general) Fair  Pain is worse with: walking, bending, sitting, inactivity and standing Pain improves with: rest, heat/ice and medication Relief from Meds: 6  Mobility walk without assistance how many minutes can you walk? 20 ability to climb steps?  yes do you drive?  yes needs help with transfers  Function disabled: date disabled n/a I need assistance with the following:  household duties  Neuro/Psych weakness numbness tingling trouble walking spasms dizziness confusion depression anxiety  Prior Studies Any changes since last visit?  no  Physicians involved in your care Any changes since last  visit?  no   Family History  Problem Relation Age of Onset  . Diabetes type II Mother   . Colon polyps Mother   . Diverticulitis Mother   . Diabetes Mother   . Heart disease Mother   . Osteoporosis Father   . Diabetes type II Brother   . Uterine cancer Unknown   . Ovarian cancer Unknown   . COPD Paternal Grandfather   . Heart attack Maternal Grandmother    Social History   Socioeconomic History  . Marital status: Married    Spouse name: Not on file  . Number of children: 2  . Years of education: Not on file  . Highest education level: Not on file  Occupational History  . Occupation: Disability    Employer: UNEMPLOYED  Social Needs  . Financial resource strain: Not on file  . Food insecurity:    Worry: Not on file    Inability: Not on file  . Transportation needs:    Medical: Not on file    Non-medical: Not on file  Tobacco Use  . Smoking status: Former Smoker    Types: Cigarettes    Last attempt to quit: 06/30/2011    Years since quitting: 6.8  . Smokeless tobacco: Never Used  Substance and Sexual Activity  . Alcohol use: No    Alcohol/week: 0.0 standard drinks  . Drug use: No  . Sexual activity: Yes  Lifestyle  . Physical activity:    Days per week: Not on file    Minutes per session: Not on file  . Stress: Not on file  Relationships  .  Social connections:    Talks on phone: Not on file    Gets together: Not on file    Attends religious service: Not on file    Active member of club or organization: Not on file    Attends meetings of clubs or organizations: Not on file    Relationship status: Not on file  Other Topics Concern  . Not on file  Social History Narrative  . Not on file   Past Surgical History:  Procedure Laterality Date  . CESAREAN SECTION    . Left elbow surgery    . PARTIAL HYSTERECTOMY    . TONSILLECTOMY     Past Medical History:  Diagnosis Date  . Anxiety   . Chronic pain    Dr. Letta Pate  . Degenerative disc disease     Spinal, small syrinx  . Depression   . Esophageal stricture    Status post dilatation  . Fibromyalgia   . GERD (gastroesophageal reflux disease)   . Hiatal hernia   . Internal hemorrhoids without mention of complication   . Irritable bowel syndrome   . Palpitations    No documented arrhythmias  . Personal history of colonic polyps 10/04/2000   HYPERPLASTIC POLYP  . POTS (postural orthostatic tachycardia syndrome)   . Syncope    Negative tilt table test  . Syringomyelia and syringobulbia ([facility])    BP 131/87   Pulse 94   Ht 5' 6.5" (1.689 m)   Wt 141 lb (64 kg)   SpO2 98%   BMI 22.42 kg/m   Opioid Risk Score:   Fall Risk Score:  `1  Depression screen PHQ 2/9  Depression screen [facility] 2/9 04/25/2018 09/20/2017 07/22/2017 05/24/2017 05/26/2016 02/24/2016 04/23/2015  Decreased Interest 1 1 1 1 1 1 1   Down, Depressed, Hopeless 1 1 1 1 1 1 1   PHQ - 2 Score 2 2 2 2 2 2 2   Altered sleeping - - - - - - -  Tired, decreased energy - - - - - - -  Change in appetite - - - - - - -  Feeling bad or failure about yourself  - - - - - - -  Trouble concentrating - - - - - - -  Moving slowly or fidgety/restless - - - - - - -  Suicidal thoughts - - - - - - -  PHQ-9 Score - - - - - - -  Some recent data might be hidden    Review of Systems  Constitutional: Negative.   HENT: Negative.   Eyes: Negative.   Respiratory: Negative.   Cardiovascular: Negative.   Gastrointestinal: Negative.   Endocrine: Negative.   Genitourinary: Negative.   Musculoskeletal: Negative.   Skin: Negative.   Allergic/Immunologic: Negative.   Neurological: Negative.   Hematological: Negative.   Psychiatric/Behavioral: Negative.   All other systems reviewed and are negative.      Objective:   Physical Exam  Constitutional: She is oriented to person, place, and time. She appears well-developed and well-nourished.  HENT:  Head: Normocephalic and atraumatic.  Neck: Normal range of motion. Neck supple.  Cervical  Paraspinal Tenderness: C-5-C-6  Cardiovascular: Normal rate and regular rhythm.  Pulmonary/Chest: Effort normal and breath sounds normal.  Musculoskeletal:  Normal Muscle Bulk and Muscle Testing Reveals: Upper Extremities: Full ROM and Muscle Strength 5/5 Bilateral AC Joint Tenderness Thoracic Paraspinal Tenderness: T-7-T-9 Mainly Right Side Lumbar Paraspinal Tenderness: L-4-L-5 Lower Extremities: Full ROM and Muscle Strength 5/5 Arises from table with  ease Narrow Based gait  Neurological: She is alert and oriented to person, place, and time.  Skin: Skin is warm and dry.  Psychiatric: She has a normal mood and affect. Her behavior is normal.  Nursing note and vitals reviewed.         Assessment & Plan:  1. Lumbar degenerative disc disease: 04/25/2018 Continue with current treatment regimen:Refilled:HYDROcodone 7.5/325 mg two tablets every 8 hours as needed #180. We will continue the opioid monitoring program, this consists of regular clinic visits, examinations, urine drug screen, pill counts as well as use of [place] Controlled Substance Reporting System. 2. Thoracic Spondylosis: Continuecurrent treatment withHEP and current medication regime. 04/25/2018 3 Cervicalgia/ Cervical Radiculitis/ Cervical spondylosis without evidence of myelopathy:RX: Xray. Continue to Monitor.. Lyrica discontinued due to adverse effects. Continue with exercise and heat therapy. 04/25/2018 4. Fibromyalgia:Continue with Heat and exerciseRegimen Lyrica discontinued, Adverse Effects.04/25/2018 5.Depression and anxiety: PCP Following. 04/25/2018 6.Orthostatic hypotension: Cardiology Following. 04/25/2018 7. Polyarthralgia: Continuecurrent treatment withMeloxicam. 04/25/2018 8. Chronic Shoulder Pain: Continue current medication regime. Continue HEP as Tolerated. 04/25/2018. 9. Muscle Spasm:ContinueRobaxin.04/25/2018. 10. Bilateral Wrist Pain: RX: X-ray 11. Right Shoulder Pain: RX:  X-ray 12. Right Elbow Pain: RX: X-ray.  30  minutes of face to face patient care time was spent during this visit. All questions were encouraged and answered.  F/U in 1 month

## 2018-05-05 DIAGNOSIS — K219 Gastro-esophageal reflux disease without esophagitis: Secondary | ICD-10-CM | POA: Diagnosis not present

## 2018-05-05 DIAGNOSIS — Z23 Encounter for immunization: Secondary | ICD-10-CM | POA: Diagnosis not present

## 2018-05-05 DIAGNOSIS — R5383 Other fatigue: Secondary | ICD-10-CM | POA: Diagnosis not present

## 2018-05-05 DIAGNOSIS — M2550 Pain in unspecified joint: Secondary | ICD-10-CM | POA: Diagnosis not present

## 2018-05-05 DIAGNOSIS — Z77120 Contact with and (suspected) exposure to mold (toxic): Secondary | ICD-10-CM | POA: Diagnosis not present

## 2018-05-05 DIAGNOSIS — Z1389 Encounter for screening for other disorder: Secondary | ICD-10-CM | POA: Diagnosis not present

## 2018-05-05 DIAGNOSIS — Z6822 Body mass index (BMI) 22.0-22.9, adult: Secondary | ICD-10-CM | POA: Diagnosis not present

## 2018-05-05 DIAGNOSIS — M353 Polymyalgia rheumatica: Secondary | ICD-10-CM | POA: Diagnosis not present

## 2018-05-14 ENCOUNTER — Encounter: Payer: Self-pay | Admitting: Psychology
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
## 2018-05-19 ENCOUNTER — Telehealth: Payer: Self-pay | Admitting: Neurology

## 2018-05-19 NOTE — Telephone Encounter (Signed)
We received a new referral on pt to r/o demyelinating disease;  pt having memory loss and headaches. She has seen Dr. Brett Fairy in the past, but is requesting to see Dr. Jannifer Franklin for this. Are you both ok with this?

## 2018-05-19 NOTE — Telephone Encounter (Signed)
Okay for transfer of care.

## 2018-05-19 NOTE — Telephone Encounter (Signed)
Yes, I am - CD

## 2018-05-27 ENCOUNTER — Encounter: Payer: Medicare Other | Attending: Physical Medicine & Rehabilitation | Admitting: Registered Nurse

## 2018-05-27 ENCOUNTER — Encounter: Payer: Medicare Other | Admitting: Registered Nurse

## 2018-05-27 ENCOUNTER — Telehealth: Payer: Self-pay | Admitting: Psychology

## 2018-05-27 ENCOUNTER — Encounter: Payer: Self-pay | Admitting: Registered Nurse

## 2018-05-27 VITALS — BP 101/70 | HR 91 | Ht 67.0 in | Wt 141.0 lb

## 2018-05-27 DIAGNOSIS — F329 Major depressive disorder, single episode, unspecified: Secondary | ICD-10-CM | POA: Insufficient documentation

## 2018-05-27 DIAGNOSIS — M545 Low back pain: Secondary | ICD-10-CM | POA: Insufficient documentation

## 2018-05-27 DIAGNOSIS — Z86010 Personal history of colonic polyps: Secondary | ICD-10-CM | POA: Insufficient documentation

## 2018-05-27 DIAGNOSIS — M7581 Other shoulder lesions, right shoulder: Secondary | ICD-10-CM

## 2018-05-27 DIAGNOSIS — R51 Headache: Secondary | ICD-10-CM | POA: Insufficient documentation

## 2018-05-27 DIAGNOSIS — M47892 Other spondylosis, cervical region: Secondary | ICD-10-CM | POA: Diagnosis not present

## 2018-05-27 DIAGNOSIS — R000 Tachycardia, unspecified: Secondary | ICD-10-CM | POA: Diagnosis not present

## 2018-05-27 DIAGNOSIS — M797 Fibromyalgia: Secondary | ICD-10-CM | POA: Insufficient documentation

## 2018-05-27 DIAGNOSIS — M542 Cervicalgia: Secondary | ICD-10-CM | POA: Diagnosis not present

## 2018-05-27 DIAGNOSIS — G5793 Unspecified mononeuropathy of bilateral lower limbs: Secondary | ICD-10-CM

## 2018-05-27 DIAGNOSIS — M25531 Pain in right wrist: Secondary | ICD-10-CM

## 2018-05-27 DIAGNOSIS — M4716 Other spondylosis with myelopathy, lumbar region: Secondary | ICD-10-CM

## 2018-05-27 DIAGNOSIS — Z87891 Personal history of nicotine dependence: Secondary | ICD-10-CM | POA: Insufficient documentation

## 2018-05-27 DIAGNOSIS — K589 Irritable bowel syndrome without diarrhea: Secondary | ICD-10-CM | POA: Insufficient documentation

## 2018-05-27 DIAGNOSIS — K219 Gastro-esophageal reflux disease without esophagitis: Secondary | ICD-10-CM | POA: Insufficient documentation

## 2018-05-27 DIAGNOSIS — Z599 Problem related to housing and economic circumstances, unspecified: Secondary | ICD-10-CM | POA: Diagnosis not present

## 2018-05-27 DIAGNOSIS — K449 Diaphragmatic hernia without obstruction or gangrene: Secondary | ICD-10-CM | POA: Diagnosis not present

## 2018-05-27 DIAGNOSIS — R55 Syncope and collapse: Secondary | ICD-10-CM | POA: Insufficient documentation

## 2018-05-27 DIAGNOSIS — K648 Other hemorrhoids: Secondary | ICD-10-CM | POA: Insufficient documentation

## 2018-05-27 DIAGNOSIS — Z760 Encounter for issue of repeat prescription: Secondary | ICD-10-CM | POA: Diagnosis not present

## 2018-05-27 DIAGNOSIS — R002 Palpitations: Secondary | ICD-10-CM | POA: Diagnosis not present

## 2018-05-27 DIAGNOSIS — I951 Orthostatic hypotension: Secondary | ICD-10-CM | POA: Diagnosis not present

## 2018-05-27 DIAGNOSIS — G8929 Other chronic pain: Secondary | ICD-10-CM | POA: Insufficient documentation

## 2018-05-27 DIAGNOSIS — M25532 Pain in left wrist: Secondary | ICD-10-CM

## 2018-05-27 DIAGNOSIS — F419 Anxiety disorder, unspecified: Secondary | ICD-10-CM | POA: Insufficient documentation

## 2018-05-27 DIAGNOSIS — M2550 Pain in unspecified joint: Secondary | ICD-10-CM

## 2018-05-27 DIAGNOSIS — M47814 Spondylosis without myelopathy or radiculopathy, thoracic region: Secondary | ICD-10-CM | POA: Diagnosis not present

## 2018-05-27 DIAGNOSIS — M5136 Other intervertebral disc degeneration, lumbar region: Secondary | ICD-10-CM | POA: Insufficient documentation

## 2018-05-27 DIAGNOSIS — K0889 Other specified disorders of teeth and supporting structures: Secondary | ICD-10-CM | POA: Diagnosis not present

## 2018-05-27 DIAGNOSIS — M778 Other enthesopathies, not elsewhere classified: Secondary | ICD-10-CM

## 2018-05-27 DIAGNOSIS — K222 Esophageal obstruction: Secondary | ICD-10-CM | POA: Diagnosis not present

## 2018-05-27 MED ORDER — GABAPENTIN 100 MG PO CAPS: 100.0000 mg | ORAL_CAPSULE | Freq: Three times a day (TID) | ORAL | 3 refills | Status: DC

## 2018-05-27 NOTE — Telephone Encounter (Signed)
Patient cannot afford copay 2 x's per month is it okay to drop one of her December appts - request from ptn

## 2018-05-27 NOTE — Patient Instructions (Signed)
Start Gabapentin Tonight: One Capsule at Bedtime Only   Call Office Next week to Evaluate

## 2018-05-27 NOTE — Progress Notes (Signed)
Subjective:    Patient ID: [name], female    DOB: Nov 26, 1975, 42 y.o.   MRN: [id number]  HPI: [name] is a 42 year old female who returns for follow up appointment for chronic pain and medication refill. She states her pain is located in her bilateral wrist, neck, right shoulder,right elbow, mid-lower back and bilateral hands and bilateral feet with tingling and burning pain. We will prescribe gabapentin today, she was given instructions, she verbalizes understanding.   Ms. Show Morphine Equivalent is 45.00 MME. She is also prescribed Clonazepam  by Dr. Gerarda Fraction. We have discussed the black box warning of using opioids and benzodiazepines. I highlighted the dangers of using these drugs together and discussed the adverse events including respiratory suppression, overdose, cognitive impairment and importance of compliance with current regimen. We will continue to monitor and adjust as indicated.   Pain Inventory Average Pain 7 Pain Right Now 6 My pain is intermittent, sharp, burning, dull, stabbing, tingling and aching  In the last 24 hours, has pain interfered with the following? General activity 7 Relation with others 8 Enjoyment of life 7 What TIME of day is your pain at its worst? night Sleep (in general) Fair  Pain is worse with: walking, bending, sitting, inactivity, standing and some activites Pain improves with: rest, heat/ice and medication Relief from Meds: 6  Mobility walk without assistance ability to climb steps?  yes do you drive?  yes  Function disabled: date disabled .  Neuro/Psych numbness tremor tingling spasms  Prior Studies Any changes since last visit?  no  Physicians involved in your care Any changes since last visit?  no   Family History  Problem Relation Age of Onset  . Diabetes type II Mother   . Colon polyps Mother   . Diverticulitis Mother   . Diabetes Mother   . Heart disease Mother   . Osteoporosis Father   .  Diabetes type II Brother   . Uterine cancer Unknown   . Ovarian cancer Unknown   . COPD Paternal Grandfather   . Heart attack Maternal Grandmother    Social History   Socioeconomic History  . Marital status: Married    Spouse name: Not on file  . Number of children: 2  . Years of education: Not on file  . Highest education level: Not on file  Occupational History  . Occupation: Disability    Employer: UNEMPLOYED  Social Needs  . Financial resource strain: Not on file  . Food insecurity:    Worry: Not on file    Inability: Not on file  . Transportation needs:    Medical: Not on file    Non-medical: Not on file  Tobacco Use  . Smoking status: Former Smoker    Types: Cigarettes    Last attempt to quit: 06/30/2011    Years since quitting: 6.9  . Smokeless tobacco: Never Used  Substance and Sexual Activity  . Alcohol use: No    Alcohol/week: 0.0 standard drinks  . Drug use: No  . Sexual activity: Yes  Lifestyle  . Physical activity:    Days per week: Not on file    Minutes per session: Not on file  . Stress: Not on file  Relationships  . Social connections:    Talks on phone: Not on file    Gets together: Not on file    Attends religious service: Not on file    Active member of club or organization: Not on  file    Attends meetings of clubs or organizations: Not on file    Relationship status: Not on file  Other Topics Concern  . Not on file  Social History Narrative  . Not on file   Past Surgical History:  Procedure Laterality Date  . CESAREAN SECTION    . Left elbow surgery    . PARTIAL HYSTERECTOMY    . TONSILLECTOMY     Past Medical History:  Diagnosis Date  . Anxiety   . Chronic pain    Dr. Letta Pate  . Degenerative disc disease    Spinal, small syrinx  . Depression   . Esophageal stricture    Status post dilatation  . Fibromyalgia   . GERD (gastroesophageal reflux disease)   . Hiatal hernia   . Internal hemorrhoids without mention of  complication   . Irritable bowel syndrome   . Palpitations    No documented arrhythmias  . Personal history of colonic polyps 10/04/2000   HYPERPLASTIC POLYP  . POTS (postural orthostatic tachycardia syndrome)   . Syncope    Negative tilt table test  . 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  Opioid Risk Score:   Fall Risk Score:  `1  Depression screen PHQ 2/9  Depression screen [facility] 2/9 04/25/2018 09/20/2017 07/22/2017 05/24/2017 05/26/2016 02/24/2016 04/23/2015  Decreased Interest 1 1 1 1 1 1 1   Down, Depressed, Hopeless 1 1 1 1 1 1 1   PHQ - 2 Score 2 2 2 2 2 2 2   Altered sleeping - - - - - - -  Tired, decreased energy - - - - - - -  Change in appetite - - - - - - -  Feeling bad or failure about yourself  - - - - - - -  Trouble concentrating - - - - - - -  Moving slowly or fidgety/restless - - - - - - -  Suicidal thoughts - - - - - - -  PHQ-9 Score - - - - - - -  Some recent data might be hidden     Review of Systems  Constitutional: Positive for unexpected weight change.  HENT: Negative.   Eyes: Negative.   Respiratory: Negative.   Cardiovascular: Negative.   Gastrointestinal: Negative.   Endocrine: Negative.   Genitourinary: Negative.   Musculoskeletal: Positive for arthralgias, back pain and myalgias.  Skin: Negative.   Allergic/Immunologic: Negative.   Neurological: Positive for numbness.  Hematological: Negative.   Psychiatric/Behavioral: Negative.   All other systems reviewed and are negative.      Objective:   Physical Exam  Constitutional: She is oriented to person, place, and time. She appears well-developed and well-nourished.  HENT:  Head: Normocephalic and atraumatic.  Neck: Normal range of motion. Neck supple.  Cardiovascular: Normal rate and regular rhythm.  Pulmonary/Chest: Effort normal and breath sounds normal.  Musculoskeletal:  Normal Muscle Bulk and Muscle Testing Reveals: Upper Extremities: Full  ROM and Muscle Strength 5/5 Right Ac Joint Tenderness Lumbar Paraspinal Tenderness: L-4-L-5 Lower Extremities: Full ROM and Muscle Strength 5/5 Arises from Table with Ease Narrow Based gait  Neurological: She is alert and oriented to person, place, and time.  Skin: Skin is warm and dry.  Psychiatric: She has a normal mood and affect.  Nursing note and vitals reviewed.         Assessment & Plan:  1. Lumbar degenerative disc disease: 05/27/2018 Continue with current treatment regimen:Continue :HYDROcodone 7.5/325  mg two tablets every 8 hours as needed #180. We will continue the opioid monitoring program, this consists of regular clinic visits, examinations, urine drug screen, pill counts as well as use of [place] Controlled Substance Reporting System. 2. Thoracic Spondylosis: Continuecurrent treatment withHEP and current medication regime. 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 Continue to Monitor.. Lyrica discontinued due to adverse effects. Continue with exercise and heat therapy. 05/27/2018 4. Fibromyalgia:Continue with Heat and exerciseRegimen. RX: Gabapentin.  Lyrica discontinued, Adverse Effects.05/27/2018 5.Depression and anxiety: PCP Following. 05/27/2018 6.Orthostatic hypotension: Cardiology Following. 05/27/2018 7. Polyarthralgia: Continuecurrent treatment withMeloxicam. 05/27/2018 8. Chronic Shoulder Pain: Continue current medication regime. Continue HEP as Tolerated. 05/27/2018. 9. Muscle Spasm:ContinueRobaxin.05/27/2018. 10. Bilateral Wrist Pain:[place] on : X-ray: Results 11. Right Shoulder PainWaiting on : X-ray: Results 12. Right Elbow Pain:Waiting on : X-ray.Results  13. Neuropathic Pain: RX: Gabapentin  20  minutes of face to face patient care time was spent during this visit. All questions were encouraged and answered.  F/U in 1  month

## 2018-07-05 ENCOUNTER — Encounter: Payer: Medicare Other | Attending: Physical Medicine & Rehabilitation | Admitting: Psychology

## 2018-07-05 DIAGNOSIS — M47892 Other spondylosis, cervical region: Secondary | ICD-10-CM | POA: Insufficient documentation

## 2018-07-05 DIAGNOSIS — M2550 Pain in unspecified joint: Secondary | ICD-10-CM | POA: Diagnosis not present

## 2018-07-05 DIAGNOSIS — M542 Cervicalgia: Secondary | ICD-10-CM | POA: Diagnosis not present

## 2018-07-05 DIAGNOSIS — M545 Low back pain: Secondary | ICD-10-CM | POA: Insufficient documentation

## 2018-07-05 DIAGNOSIS — K589 Irritable bowel syndrome without diarrhea: Secondary | ICD-10-CM | POA: Insufficient documentation

## 2018-07-05 DIAGNOSIS — R000 Tachycardia, unspecified: Secondary | ICD-10-CM | POA: Insufficient documentation

## 2018-07-05 DIAGNOSIS — F419 Anxiety disorder, unspecified: Secondary | ICD-10-CM | POA: Diagnosis not present

## 2018-07-05 DIAGNOSIS — Z86010 Personal history of colonic polyps: Secondary | ICD-10-CM | POA: Insufficient documentation

## 2018-07-05 DIAGNOSIS — K0889 Other specified disorders of teeth and supporting structures: Secondary | ICD-10-CM | POA: Insufficient documentation

## 2018-07-05 DIAGNOSIS — Z760 Encounter for issue of repeat prescription: Secondary | ICD-10-CM | POA: Insufficient documentation

## 2018-07-05 DIAGNOSIS — Z599 Problem related to housing and economic circumstances, unspecified: Secondary | ICD-10-CM | POA: Insufficient documentation

## 2018-07-05 DIAGNOSIS — K449 Diaphragmatic hernia without obstruction or gangrene: Secondary | ICD-10-CM | POA: Diagnosis not present

## 2018-07-05 DIAGNOSIS — M797 Fibromyalgia: Secondary | ICD-10-CM | POA: Insufficient documentation

## 2018-07-05 DIAGNOSIS — M5136 Other intervertebral disc degeneration, lumbar region: Secondary | ICD-10-CM | POA: Diagnosis not present

## 2018-07-05 DIAGNOSIS — F411 Generalized anxiety disorder: Secondary | ICD-10-CM | POA: Diagnosis not present

## 2018-07-05 DIAGNOSIS — R55 Syncope and collapse: Secondary | ICD-10-CM | POA: Diagnosis not present

## 2018-07-05 DIAGNOSIS — K648 Other hemorrhoids: Secondary | ICD-10-CM | POA: Insufficient documentation

## 2018-07-05 DIAGNOSIS — R51 Headache: Secondary | ICD-10-CM | POA: Diagnosis not present

## 2018-07-05 DIAGNOSIS — Z87891 Personal history of nicotine dependence: Secondary | ICD-10-CM | POA: Insufficient documentation

## 2018-07-05 DIAGNOSIS — I951 Orthostatic hypotension: Secondary | ICD-10-CM | POA: Insufficient documentation

## 2018-07-05 DIAGNOSIS — G894 Chronic pain syndrome: Secondary | ICD-10-CM

## 2018-07-05 DIAGNOSIS — R002 Palpitations: Secondary | ICD-10-CM | POA: Diagnosis not present

## 2018-07-05 DIAGNOSIS — K222 Esophageal obstruction: Secondary | ICD-10-CM | POA: Diagnosis not present

## 2018-07-05 DIAGNOSIS — K219 Gastro-esophageal reflux disease without esophagitis: Secondary | ICD-10-CM | POA: Diagnosis not present

## 2018-07-05 DIAGNOSIS — F329 Major depressive disorder, single episode, unspecified: Secondary | ICD-10-CM | POA: Diagnosis not present

## 2018-07-05 DIAGNOSIS — G8929 Other chronic pain: Secondary | ICD-10-CM | POA: Diagnosis not present

## 2018-07-12 ENCOUNTER — Other Ambulatory Visit: Payer: Self-pay

## 2018-07-12 ENCOUNTER — Encounter: Payer: Self-pay | Admitting: Physical Medicine & Rehabilitation

## 2018-07-12 ENCOUNTER — Ambulatory Visit: Payer: Medicare Other | Admitting: Physical Medicine & Rehabilitation

## 2018-07-12 VITALS — BP 115/90 | HR 99 | Ht 67.0 in | Wt 142.4 lb

## 2018-07-12 DIAGNOSIS — K0889 Other specified disorders of teeth and supporting structures: Secondary | ICD-10-CM | POA: Diagnosis not present

## 2018-07-12 DIAGNOSIS — G894 Chronic pain syndrome: Secondary | ICD-10-CM

## 2018-07-12 DIAGNOSIS — Z79891 Long term (current) use of opiate analgesic: Secondary | ICD-10-CM | POA: Diagnosis not present

## 2018-07-12 DIAGNOSIS — R002 Palpitations: Secondary | ICD-10-CM | POA: Diagnosis not present

## 2018-07-12 DIAGNOSIS — M545 Low back pain: Secondary | ICD-10-CM | POA: Diagnosis not present

## 2018-07-12 DIAGNOSIS — Z86010 Personal history of colonic polyps: Secondary | ICD-10-CM | POA: Diagnosis not present

## 2018-07-12 DIAGNOSIS — G8929 Other chronic pain: Secondary | ICD-10-CM | POA: Diagnosis not present

## 2018-07-12 DIAGNOSIS — K589 Irritable bowel syndrome without diarrhea: Secondary | ICD-10-CM | POA: Diagnosis not present

## 2018-07-12 DIAGNOSIS — M797 Fibromyalgia: Secondary | ICD-10-CM | POA: Diagnosis not present

## 2018-07-12 DIAGNOSIS — Z5181 Encounter for therapeutic drug level monitoring: Secondary | ICD-10-CM

## 2018-07-12 DIAGNOSIS — R51 Headache: Secondary | ICD-10-CM | POA: Diagnosis not present

## 2018-07-12 DIAGNOSIS — Z760 Encounter for issue of repeat prescription: Secondary | ICD-10-CM | POA: Diagnosis not present

## 2018-07-12 DIAGNOSIS — R000 Tachycardia, unspecified: Secondary | ICD-10-CM | POA: Diagnosis not present

## 2018-07-12 DIAGNOSIS — K219 Gastro-esophageal reflux disease without esophagitis: Secondary | ICD-10-CM | POA: Diagnosis not present

## 2018-07-12 DIAGNOSIS — Z599 Problem related to housing and economic circumstances, unspecified: Secondary | ICD-10-CM | POA: Diagnosis not present

## 2018-07-12 DIAGNOSIS — K648 Other hemorrhoids: Secondary | ICD-10-CM | POA: Diagnosis not present

## 2018-07-12 DIAGNOSIS — M542 Cervicalgia: Secondary | ICD-10-CM | POA: Diagnosis not present

## 2018-07-12 DIAGNOSIS — Z87891 Personal history of nicotine dependence: Secondary | ICD-10-CM | POA: Diagnosis not present

## 2018-07-12 DIAGNOSIS — M5136 Other intervertebral disc degeneration, lumbar region: Secondary | ICD-10-CM | POA: Diagnosis not present

## 2018-07-12 DIAGNOSIS — K222 Esophageal obstruction: Secondary | ICD-10-CM | POA: Diagnosis not present

## 2018-07-12 DIAGNOSIS — M47892 Other spondylosis, cervical region: Secondary | ICD-10-CM | POA: Diagnosis not present

## 2018-07-12 DIAGNOSIS — I951 Orthostatic hypotension: Secondary | ICD-10-CM | POA: Diagnosis not present

## 2018-07-12 DIAGNOSIS — K449 Diaphragmatic hernia without obstruction or gangrene: Secondary | ICD-10-CM | POA: Diagnosis not present

## 2018-07-12 DIAGNOSIS — R55 Syncope and collapse: Secondary | ICD-10-CM | POA: Diagnosis not present

## 2018-07-12 MED ORDER — HYDROCODONE-ACETAMINOPHEN 7.5-325 MG PO TABS: 2.0000 | ORAL_TABLET | Freq: Three times a day (TID) | ORAL | 0 refills | Status: DC | PRN

## 2018-07-12 NOTE — Progress Notes (Signed)
Subjective:    Patient ID: [name], female    DOB: 19-Oct-1975, 42 y.o.   MRN: [id number]  HPI 42yo female with hx of fibromyalgia, thoracic and lumbar DDD  No shooting pains down the legs.  Her back pain has been bothering her more.  She had significant social stressors including the death of her mother and wedding of her son on the same weekend. Takes Clonazepam 46m , some days none and some days takes 4  Discussed negative interaction of Hydrocodone 7.5 mg 2 po TID with clonazepam.   Pain Inventory Average Pain 6 Pain Right Now 5 My pain is intermittent, sharp, burning, dull, stabbing, tingling and aching  In the last 24 hours, has pain interfered with the following? General activity 7 Relation with others 7 Enjoyment of life 7 What TIME of day is your pain at its worst? morning evening and night Sleep (in general) Poor  Pain is worse with: walking, bending, sitting, inactivity, standing and some activites Pain improves with: rest, heat/ice, medication and TENS Relief from Meds: 6  Mobility how many minutes can you walk? 20-30 ability to climb steps?  yes do you drive?  yes  Function I need assistance with the following:  meal prep, household duties and shopping  Neuro/Psych weakness numbness tingling spasms depression anxiety  Prior Studies Any changes since last visit?  no  Physicians involved in your care Any changes since last visit?  no   Family History  Problem Relation Age of Onset  . Diabetes type II Mother   . Colon polyps Mother   . Diverticulitis Mother   . Diabetes Mother   . Heart disease Mother   . Osteoporosis Father   . Diabetes type II Brother   . Uterine cancer Unknown   . Ovarian cancer Unknown   . COPD Paternal Grandfather   . Heart attack Maternal Grandmother    Social History   Socioeconomic History  . Marital status: Married    Spouse name: Not on file  . Number of children: 2  . Years of education: Not on file  .  Highest education level: Not on file  Occupational History  . Occupation: Disability    Employer: UNEMPLOYED  Social Needs  . Financial resource strain: Not on file  . Food insecurity:    Worry: Not on file    Inability: Not on file  . Transportation needs:    Medical: Not on file    Non-medical: Not on file  Tobacco Use  . Smoking status: Former Smoker    Types: Cigarettes    Last attempt to quit: 06/30/2011    Years since quitting: 7.0  . Smokeless tobacco: Never Used  Substance and Sexual Activity  . Alcohol use: No    Alcohol/week: 0.0 standard drinks  . Drug use: No  . Sexual activity: Yes  Lifestyle  . Physical activity:    Days per week: Not on file    Minutes per session: Not on file  . Stress: Not on file  Relationships  . Social connections:    Talks on phone: Not on file    Gets together: Not on file    Attends religious service: Not on file    Active member of club or organization: Not on file    Attends meetings of clubs or organizations: Not on file    Relationship status: Not on file  Other Topics Concern  . Not on file  Social History Narrative  .  Not on file   Past Surgical History:  Procedure Laterality Date  . CESAREAN SECTION    . Left elbow surgery    . PARTIAL HYSTERECTOMY    . TONSILLECTOMY     Past Medical History:  Diagnosis Date  . Anxiety   . Chronic pain    Dr. Letta Pate  . Degenerative disc disease    Spinal, small syrinx  . Depression   . Esophageal stricture    Status post dilatation  . Fibromyalgia   . GERD (gastroesophageal reflux disease)   . Hiatal hernia   . Internal hemorrhoids without mention of complication   . Irritable bowel syndrome   . Palpitations    No documented arrhythmias  . Personal history of colonic polyps 10/04/2000   HYPERPLASTIC POLYP  . POTS (postural orthostatic tachycardia syndrome)   . Syncope    Negative tilt table test  . Syringomyelia and syringobulbia ([facility])    BP 115/90   Pulse 99    Ht 5' 7"  (1.702 m)   Wt 142 lb 6.4 oz (64.6 kg)   SpO2 98%   BMI 22.30 kg/m   Opioid Risk Score:   Fall Risk Score:  `1  Depression screen PHQ 2/9  Depression screen [facility] 2/9 04/25/2018 09/20/2017 07/22/2017 05/24/2017 05/26/2016 02/24/2016 04/23/2015  Decreased Interest 1 1 1 1 1 1 1   Down, Depressed, Hopeless 1 1 1 1 1 1 1   PHQ - 2 Score 2 2 2 2 2 2 2   Altered sleeping - - - - - - -  Tired, decreased energy - - - - - - -  Change in appetite - - - - - - -  Feeling bad or failure about yourself  - - - - - - -  Trouble concentrating - - - - - - -  Moving slowly or fidgety/restless - - - - - - -  Suicidal thoughts - - - - - - -  PHQ-9 Score - - - - - - -  Some recent data might be hidden    Review of Systems  Constitutional: Negative.   HENT: Negative.   Eyes: Negative.   Respiratory: Negative.   Cardiovascular: Negative.   Gastrointestinal: Negative.   Endocrine: Negative.   Genitourinary: Negative.   Musculoskeletal:       Spasms  Skin: Negative.   Allergic/Immunologic: Negative.   Neurological: Positive for numbness.       Tingling  Hematological: Negative.   Psychiatric/Behavioral: Positive for dysphoric mood. The patient is nervous/anxious.   All other systems reviewed and are negative.      Objective:   Physical Exam  Constitutional: She is oriented to person, place, and time. She appears well-developed and well-nourished. No distress.  HENT:  Head: Normocephalic and atraumatic.  Eyes: Pupils are equal, round, and reactive to light. EOM are normal.  Neck: Normal range of motion.  Neurological: She is alert and oriented to person, place, and time.  Skin: Skin is warm and dry. She is not diaphoretic.  Psychiatric: She has a normal mood and affect.  Nursing note and vitals reviewed. Motor 5/5 in bilateral delt , bi, tri, grip, HF, KE, ADF Sensation intact to LT in BUE and BLE Tone is normal Gait without evidenc of toe drag or knee instability Negative  SLR Lumbar spine with paraspinal tenderness L4-S1     Assessment & Plan:  1.  Hx chronic low back pain with lumbar paraspinal pain but no radicular signs or symptoms.  Some increased pain with ext, last imaging study >5 yr ago, order lumbar 2V to assess progression of DDD and Lumbar spondylosis.  If spondylosis more prominent consider Lumbar Medial branch blocks Patient has controlled substance agreement Urine drug screen to be performed today Pill count accurate Continue hydrocodone 15 mg 3 times daily PMP aware website reviewed no red flags.  On chronic benzodiazepines prescribed by PCP.  I spoke to PCP after last visit discussing her benzodiazepine plus opioid use.   2.  Hx of Fibromyalgia syndrome with more widespread body pain, will increase duloxetine to 5m daily NP in 1 month

## 2018-07-12 NOTE — Patient Instructions (Signed)
Agree with gym membewrship Back Exercises If you have pain in your back, do these exercises 2-3 times each day or as told by your doctor. When the pain goes away, do the exercises once each day, but repeat the steps more times for each exercise (do more repetitions). If you do not have pain in your back, do these exercises once each day or as told by your doctor. Exercises Single Knee to Chest  Do these steps 3-5 times in a row for each leg: 1. Lie on your back on a firm bed or the floor with your legs stretched out. 2. Bring one knee to your chest. 3. Hold your knee to your chest by grabbing your knee or thigh. 4. Pull on your knee until you feel a gentle stretch in your lower back. 5. Keep doing the stretch for 10-30 seconds. 6. Slowly let go of your leg and straighten it.  Pelvic Tilt  Do these steps 5-10 times in a row: 1. Lie on your back on a firm bed or the floor with your legs stretched out. 2. Bend your knees so they point up to the ceiling. Your feet should be flat on the floor. 3. Tighten your lower belly (abdomen) muscles to press your lower back against the floor. This will make your tailbone point up to the ceiling instead of pointing down to your feet or the floor. 4. Stay in this position for 5-10 seconds while you gently tighten your muscles and breathe evenly.  Cat-Cow  Do these steps until your lower back bends more easily: 1. Get on your hands and knees on a firm surface. Keep your hands under your shoulders, and keep your knees under your hips. You may put padding under your knees. 2. Let your head hang down, and make your tailbone point down to the floor so your lower back is round like the back of a cat. 3. Stay in this position for 5 seconds. 4. Slowly lift your head and make your tailbone point up to the ceiling so your back hangs low (sags) like the back of a cow. 5. Stay in this position for 5 seconds.  Press-Ups  Do these steps 5-10 times in a row: 1. Lie  on your belly (face-down) on the floor. 2. Place your hands near your head, about shoulder-width apart. 3. While you keep your back relaxed and keep your hips on the floor, slowly straighten your arms to raise the top half of your body and lift your shoulders. Do not use your back muscles. To make yourself more comfortable, you may change where you place your hands. 4. Stay in this position for 5 seconds. 5. Slowly return to lying flat on the floor.  Bridges  Do these steps 10 times in a row: 1. Lie on your back on a firm surface. 2. Bend your knees so they point up to the ceiling. Your feet should be flat on the floor. 3. Tighten your butt muscles and lift your butt off of the floor until your waist is almost as high as your knees. If you do not feel the muscles working in your butt and the back of your thighs, slide your feet 1-2 inches farther away from your butt. 4. Stay in this position for 3-5 seconds. 5. Slowly lower your butt to the floor, and let your butt muscles relax.  If this exercise is too easy, try doing it with your arms crossed over your chest. Belly Crunches  Do these  steps 5-10 times in a row: 1. Lie on your back on a firm bed or the floor with your legs stretched out. 2. Bend your knees so they point up to the ceiling. Your feet should be flat on the floor. 3. Cross your arms over your chest. 4. Tip your chin a little bit toward your chest but do not bend your neck. 5. Tighten your belly muscles and slowly raise your chest just enough to lift your shoulder blades a tiny bit off of the floor. 6. Slowly lower your chest and your head to the floor.  Back Lifts Do these steps 5-10 times in a row: 1. Lie on your belly (face-down) with your arms at your sides, and rest your forehead on the floor. 2. Tighten the muscles in your legs and your butt. 3. Slowly lift your chest off of the floor while you keep your hips on the floor. Keep the back of your head in line with the  curve in your back. Look at the floor while you do this. 4. Stay in this position for 3-5 seconds. 5. Slowly lower your chest and your face to the floor.  Contact a doctor if:  Your back pain gets a lot worse when you do an exercise.  Your back pain does not lessen 2 hours after you exercise. If you have any of these problems, stop doing the exercises. Do not do them again unless your doctor says it is okay. Get help right away if:  You have sudden, very bad back pain. If this happens, stop doing the exercises. Do not do them again unless your doctor says it is okay. This information is not intended to replace advice given to you by your health care provider. Make sure you discuss any questions you have with your health care provider. Document Released: 08/22/2010 Document Revised: 12/26/2015 Document Reviewed: 09/13/2014 Elsevier Interactive Patient Education  [employer].

## 2018-07-13 ENCOUNTER — Encounter: Payer: Self-pay | Admitting: Psychology
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
## 2018-07-14 ENCOUNTER — Ambulatory Visit: Payer: Medicare Other | Admitting: Psychology

## 2018-07-19 ENCOUNTER — Telehealth: Payer: Self-pay | Admitting: *Deleted

## 2018-07-19 LAB — TOXASSURE SELECT,+ANTIDEPR,UR

## 2018-07-19 NOTE — Telephone Encounter (Signed)
Urine drug screen for this encounter is consistent for prescribed medication.  There is presence of ETG but in the absence of ETS it cannot be confirmed to be alcohol.

## 2018-08-08 ENCOUNTER — Ambulatory Visit: Payer: Medicare Other | Admitting: Psychology

## 2018-08-16 ENCOUNTER — Other Ambulatory Visit: Payer: Self-pay | Admitting: Physical Medicine & Rehabilitation

## 2018-08-16 ENCOUNTER — Telehealth: Payer: Self-pay | Admitting: *Deleted

## 2018-08-16 MED ORDER — HYDROCODONE-ACETAMINOPHEN 7.5-325 MG PO TABS: 2.0000 | ORAL_TABLET | Freq: Three times a day (TID) | ORAL | 0 refills | Status: DC | PRN

## 2018-08-16 NOTE — Telephone Encounter (Signed)
PDMP Reviewed: Hydrocodone e-scribe today.

## 2018-08-16 NOTE — Telephone Encounter (Signed)
[name]'s appt is not until 09/12/18 with Zella Ball.  She last saw Dr Letta Pate. Her medication is out today and will need a refill.  I checked PMP and this is accurate, filled 07/16/18.Marland Kitchen

## 2018-08-18 ENCOUNTER — Ambulatory Visit: Payer: Medicare Other | Admitting: Registered Nurse

## 2018-08-24 DIAGNOSIS — J3489 Other specified disorders of nose and nasal sinuses: Secondary | ICD-10-CM | POA: Diagnosis not present

## 2018-08-24 DIAGNOSIS — H6993 Unspecified Eustachian tube disorder, bilateral: Secondary | ICD-10-CM | POA: Diagnosis not present

## 2018-08-24 DIAGNOSIS — Z6823 Body mass index (BMI) 23.0-23.9, adult: Secondary | ICD-10-CM | POA: Diagnosis not present

## 2018-08-29 DIAGNOSIS — R0602 Shortness of breath: Secondary | ICD-10-CM | POA: Diagnosis not present

## 2018-08-29 DIAGNOSIS — I517 Cardiomegaly: Secondary | ICD-10-CM | POA: Diagnosis not present

## 2018-08-29 DIAGNOSIS — R002 Palpitations: Secondary | ICD-10-CM | POA: Diagnosis not present

## 2018-08-29 DIAGNOSIS — R9431 Abnormal electrocardiogram [ECG] [EKG]: Secondary | ICD-10-CM | POA: Diagnosis not present

## 2018-09-12 ENCOUNTER — Encounter: Payer: Self-pay | Admitting: Registered Nurse

## 2018-09-12 ENCOUNTER — Encounter: Payer: Medicare Other | Attending: Physical Medicine & Rehabilitation | Admitting: Registered Nurse

## 2018-09-12 VITALS — BP 145/93 | HR 101 | Resp 14 | Ht 67.0 in | Wt 143.0 lb

## 2018-09-12 DIAGNOSIS — R002 Palpitations: Secondary | ICD-10-CM | POA: Diagnosis not present

## 2018-09-12 DIAGNOSIS — Z87891 Personal history of nicotine dependence: Secondary | ICD-10-CM | POA: Insufficient documentation

## 2018-09-12 DIAGNOSIS — K219 Gastro-esophageal reflux disease without esophagitis: Secondary | ICD-10-CM | POA: Insufficient documentation

## 2018-09-12 DIAGNOSIS — R51 Headache: Secondary | ICD-10-CM | POA: Diagnosis not present

## 2018-09-12 DIAGNOSIS — K449 Diaphragmatic hernia without obstruction or gangrene: Secondary | ICD-10-CM | POA: Insufficient documentation

## 2018-09-12 DIAGNOSIS — Z760 Encounter for issue of repeat prescription: Secondary | ICD-10-CM | POA: Insufficient documentation

## 2018-09-12 DIAGNOSIS — Z599 Problem related to housing and economic circumstances, unspecified: Secondary | ICD-10-CM | POA: Diagnosis not present

## 2018-09-12 DIAGNOSIS — M47814 Spondylosis without myelopathy or radiculopathy, thoracic region: Secondary | ICD-10-CM | POA: Diagnosis not present

## 2018-09-12 DIAGNOSIS — F419 Anxiety disorder, unspecified: Secondary | ICD-10-CM | POA: Insufficient documentation

## 2018-09-12 DIAGNOSIS — M47892 Other spondylosis, cervical region: Secondary | ICD-10-CM | POA: Diagnosis not present

## 2018-09-12 DIAGNOSIS — Z86010 Personal history of colonic polyps: Secondary | ICD-10-CM | POA: Insufficient documentation

## 2018-09-12 DIAGNOSIS — M4716 Other spondylosis with myelopathy, lumbar region: Secondary | ICD-10-CM | POA: Diagnosis not present

## 2018-09-12 DIAGNOSIS — F329 Major depressive disorder, single episode, unspecified: Secondary | ICD-10-CM | POA: Diagnosis not present

## 2018-09-12 DIAGNOSIS — I951 Orthostatic hypotension: Secondary | ICD-10-CM | POA: Diagnosis not present

## 2018-09-12 DIAGNOSIS — K648 Other hemorrhoids: Secondary | ICD-10-CM | POA: Insufficient documentation

## 2018-09-12 DIAGNOSIS — M5136 Other intervertebral disc degeneration, lumbar region: Secondary | ICD-10-CM | POA: Diagnosis not present

## 2018-09-12 DIAGNOSIS — M545 Low back pain: Secondary | ICD-10-CM | POA: Diagnosis not present

## 2018-09-12 DIAGNOSIS — F411 Generalized anxiety disorder: Secondary | ICD-10-CM

## 2018-09-12 DIAGNOSIS — Z5181 Encounter for therapeutic drug level monitoring: Secondary | ICD-10-CM

## 2018-09-12 DIAGNOSIS — K222 Esophageal obstruction: Secondary | ICD-10-CM | POA: Diagnosis not present

## 2018-09-12 DIAGNOSIS — G8929 Other chronic pain: Secondary | ICD-10-CM | POA: Insufficient documentation

## 2018-09-12 DIAGNOSIS — M797 Fibromyalgia: Secondary | ICD-10-CM | POA: Insufficient documentation

## 2018-09-12 DIAGNOSIS — K0889 Other specified disorders of teeth and supporting structures: Secondary | ICD-10-CM | POA: Insufficient documentation

## 2018-09-12 DIAGNOSIS — M542 Cervicalgia: Secondary | ICD-10-CM | POA: Diagnosis not present

## 2018-09-12 DIAGNOSIS — K589 Irritable bowel syndrome without diarrhea: Secondary | ICD-10-CM | POA: Insufficient documentation

## 2018-09-12 DIAGNOSIS — Z79891 Long term (current) use of opiate analgesic: Secondary | ICD-10-CM

## 2018-09-12 DIAGNOSIS — M2550 Pain in unspecified joint: Secondary | ICD-10-CM

## 2018-09-12 DIAGNOSIS — R000 Tachycardia, unspecified: Secondary | ICD-10-CM | POA: Insufficient documentation

## 2018-09-12 DIAGNOSIS — G894 Chronic pain syndrome: Secondary | ICD-10-CM

## 2018-09-12 DIAGNOSIS — R0602 Shortness of breath: Secondary | ICD-10-CM | POA: Diagnosis not present

## 2018-09-12 DIAGNOSIS — R55 Syncope and collapse: Secondary | ICD-10-CM | POA: Diagnosis not present

## 2018-09-12 MED ORDER — HYDROCODONE-ACETAMINOPHEN 7.5-325 MG PO TABS: 2.0000 | ORAL_TABLET | Freq: Three times a day (TID) | ORAL | 0 refills | Status: DC | PRN

## 2018-09-12 NOTE — Progress Notes (Signed)
Subjective:    Patient ID: [name], female    DOB: July 02, 1976, 43 y.o.   MRN: [id number]  HPI: [name] is a 43 y.o. female who returns for follow up appointment for chronic pain and medication refill. She states her pain is located in her neck and lower back. She rates her pain 6. Her current exercise regime is walking and performing stretching exercises.  Ms. Mowers Morphine equivalent is 45.00  MME. She is also prescribed Clonazepam by Dr.Fusco. We have discussed the black box warning of using opioids and benzodiazepines. I highlighted the dangers of using these drugs together and discussed the adverse events including respiratory suppression, overdose, cognitive impairment and importance of compliance with current regimen. We will continue to monitor and adjust as indicated.   Last UDS was performed on 07/12/2018, it was consistent.    Pain Inventory Average Pain 7 Pain Right Now 6 My pain is constant, sharp, burning, dull, stabbing, tingling and aching  In the last 24 hours, has pain interfered with the following? General activity n/a Relation with others n/a Enjoyment of life n/a What TIME of day is your pain at its worst? evening Sleep (in general) Poor  Pain is worse with: walking, bending, sitting, inactivity, standing and some activites Pain improves with: rest, heat/ice, medication and TENS Relief from Meds: 6  Mobility walk without assistance how many minutes can you walk? 25 ability to climb steps?  yes do you drive?  yes Do you have any goals in this area?  yes  Function disabled: date disabled . I need assistance with the following:  meal prep, household duties and shopping Do you have any goals in this area?  yes  Neuro/Psych weakness numbness tremor tingling spasms confusion depression anxiety  Prior Studies Any changes since last visit?  no  Physicians involved in your care Any changes since last visit?  no   Family History    Problem Relation Age of Onset  . Diabetes type II Mother   . Colon polyps Mother   . Diverticulitis Mother   . Diabetes Mother   . Heart disease Mother   . Osteoporosis Father   . Diabetes type II Brother   . Uterine cancer Other   . Ovarian cancer Other   . COPD Paternal Grandfather   . Heart attack Maternal Grandmother    Social History   Socioeconomic History  . Marital status: Married    Spouse name: Not on file  . Number of children: 2  . Years of education: Not on file  . Highest education level: Not on file  Occupational History  . Occupation: Disability    Employer: UNEMPLOYED  Social Needs  . Financial resource strain: Not on file  . Food insecurity:    Worry: Not on file    Inability: Not on file  . Transportation needs:    Medical: Not on file    Non-medical: Not on file  Tobacco Use  . Smoking status: Former Smoker    Types: Cigarettes    Last attempt to quit: 06/30/2011    Years since quitting: 7.2  . Smokeless tobacco: Never Used  Substance and Sexual Activity  . Alcohol use: No    Alcohol/week: 0.0 standard drinks  . Drug use: No  . Sexual activity: Yes  Lifestyle  . Physical activity:    Days per week: Not on file    Minutes per session: Not on file  . Stress: Not on file  Relationships  . Social connections:    Talks on phone: Not on file    Gets together: Not on file    Attends religious service: Not on file    Active member of club or organization: Not on file    Attends meetings of clubs or organizations: Not on file    Relationship status: Not on file  Other Topics Concern  . Not on file  Social History Narrative  . Not on file   Past Surgical History:  Procedure Laterality Date  . CESAREAN SECTION    . Left elbow surgery    . PARTIAL HYSTERECTOMY    . TONSILLECTOMY     Past Medical History:  Diagnosis Date  . Anxiety   . Chronic pain    Dr. Letta Pate  . Degenerative disc disease    Spinal, small syrinx  . Depression    . Esophageal stricture    Status post dilatation  . Fibromyalgia   . GERD (gastroesophageal reflux disease)   . Hiatal hernia   . Internal hemorrhoids without mention of complication   . Irritable bowel syndrome   . Palpitations    No documented arrhythmias  . Personal history of colonic polyps 10/04/2000   HYPERPLASTIC POLYP  . POTS (postural orthostatic tachycardia syndrome)   . Syncope    Negative tilt table test  . Syringomyelia and syringobulbia (HCC)    BP (!) 145/93   Pulse (!) 101   Resp 14   Ht 5' 7"  (1.702 m)   Wt 143 lb (64.9 kg)   SpO2 98%   BMI 22.40 kg/m   Opioid Risk Score:   Fall Risk Score:  `1  Depression screen PHQ 2/9  Depression screen [facility] 2/9 04/25/2018 09/20/2017 07/22/2017 05/24/2017 05/26/2016 02/24/2016 04/23/2015  Decreased Interest 1 1 1 1 1 1 1   Down, Depressed, Hopeless 1 1 1 1 1 1 1   PHQ - 2 Score 2 2 2 2 2 2 2   Altered sleeping - - - - - - -  Tired, decreased energy - - - - - - -  Change in appetite - - - - - - -  Feeling bad or failure about yourself  - - - - - - -  Trouble concentrating - - - - - - -  Moving slowly or fidgety/restless - - - - - - -  Suicidal thoughts - - - - - - -  PHQ-9 Score - - - - - - -  Some recent data might be hidden    Review of Systems  Constitutional: Negative.   HENT: Negative.   Eyes: Negative.   Respiratory: Negative.   Cardiovascular: Negative.   Gastrointestinal: Negative.   Endocrine: Negative.   Genitourinary: Negative.   Musculoskeletal: Positive for arthralgias, back pain, myalgias, neck pain and neck stiffness.       Spasms   Skin: Negative.   Allergic/Immunologic: Negative.   Neurological: Positive for tremors, weakness, numbness and headaches.       Tingling  Psychiatric/Behavioral: Positive for confusion and dysphoric mood. The patient is nervous/anxious.   All other systems reviewed and are negative.      Objective:   Physical Exam Vitals signs and nursing note reviewed.   Constitutional:      Appearance: Normal appearance.  Neck:     Musculoskeletal: Normal range of motion and neck supple.     Comments: Cervical Paraspinal Tenderness: C-5-C-6 Cardiovascular:     Rate and Rhythm: Normal rate and regular rhythm.  Pulses: Normal pulses.     Heart sounds: Normal heart sounds.  Pulmonary:     Effort: Pulmonary effort is normal.     Breath sounds: Normal breath sounds.  Musculoskeletal:     Comments: Normal Muscle Bulk and Muscle Testing Reveals:  Upper Extremities: Full ROM and Muscle Strength 5/5 , Lumbar Paraspinal Tenderness: L-4-L-5 Lower Extremities: Full ROM and Muscle Strength 5/5 Arises from Table with ease Narrow Based  Gait   Skin:    General: Skin is warm and dry.  Neurological:     Mental Status: She is alert and oriented to person, place, and time.           Assessment & Plan:  1. Lumbar degenerative disc disease: 09/12/2018. Continue with current treatment regimen:Continue :HYDROcodone 7.5/325 mg two tablets every 8 hours as needed #180. We will continue the opioid monitoring program, this consists of regular clinic visits, examinations, urine drug screen, pill counts as well as use of [place] Controlled Substance Reporting System. 2. Thoracic Spondylosis: Continuecurrent treatment withHEP and current medication regime. 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 Lyrica discontinued due to adverse effects. Continue with exercise and heat therapy. 09/12/2018. 4. Fibromyalgia:Continue with Heat and exerciseRegimen. Continue Gabapentin.  5.Depression and anxiety: PCP Following. 09/12/2018 6.Orthostatic hypotension: Cardiology Following. 09/12/2018 7. Polyarthralgia: Continueto monitor. 09/12/2018 8. Chronic Shoulder Pain:No complaints today. Continue current medication regime. Continue HEP as Tolerated. 09/12/2018. 9.  Muscle Spasm:ContinueRobaxin.09/12/2018. 10.. Neuropathic Pain: Continue:Gabapentin  34mnutes of face to face patient care time was spent during this visit. All questions were encouraged and answered.  F/U in 1 month

## 2018-09-15 ENCOUNTER — Encounter: Payer: Medicare Other | Admitting: Psychology

## 2018-09-23 DIAGNOSIS — I10 Essential (primary) hypertension: Secondary | ICD-10-CM | POA: Diagnosis not present

## 2018-09-23 DIAGNOSIS — Z6822 Body mass index (BMI) 22.0-22.9, adult: Secondary | ICD-10-CM | POA: Diagnosis not present

## 2018-09-23 DIAGNOSIS — I341 Nonrheumatic mitral (valve) prolapse: Secondary | ICD-10-CM | POA: Diagnosis not present

## 2018-09-23 DIAGNOSIS — G894 Chronic pain syndrome: Secondary | ICD-10-CM | POA: Diagnosis not present

## 2018-09-23 DIAGNOSIS — K219 Gastro-esophageal reflux disease without esophagitis: Secondary | ICD-10-CM | POA: Diagnosis not present

## 2018-09-23 DIAGNOSIS — R002 Palpitations: Secondary | ICD-10-CM | POA: Diagnosis not present

## 2018-09-23 DIAGNOSIS — Z0001 Encounter for general adult medical examination with abnormal findings: Secondary | ICD-10-CM | POA: Diagnosis not present

## 2018-09-23 DIAGNOSIS — Z1389 Encounter for screening for other disorder: Secondary | ICD-10-CM | POA: Diagnosis not present

## 2018-09-23 DIAGNOSIS — M353 Polymyalgia rheumatica: Secondary | ICD-10-CM | POA: Diagnosis not present

## 2018-09-23 DIAGNOSIS — Z79899 Other long term (current) drug therapy: Secondary | ICD-10-CM | POA: Diagnosis not present

## 2018-09-29 ENCOUNTER — Other Ambulatory Visit (HOSPITAL_COMMUNITY): Payer: Self-pay | Admitting: Internal Medicine

## 2018-09-29 DIAGNOSIS — Z1231 Encounter for screening mammogram for malignant neoplasm of breast: Secondary | ICD-10-CM

## 2018-10-05 ENCOUNTER — Ambulatory Visit (HOSPITAL_COMMUNITY)
Admission: RE | Admit: 2018-10-05 | Discharge: 2018-10-05 | Disposition: A | Discharge status: Home / Self Care | Payer: Medicare Other | Source: Ambulatory Visit | Attending: Internal Medicine | Admitting: Internal Medicine

## 2018-10-05 ENCOUNTER — Ambulatory Visit (HOSPITAL_COMMUNITY)
Admission: RE | Admit: 2018-10-05 | Discharge: 2018-10-05 | Disposition: A | Discharge status: Home / Self Care | Payer: Medicare Other | Source: Ambulatory Visit | Attending: Registered Nurse | Admitting: Registered Nurse

## 2018-10-05 DIAGNOSIS — M25531 Pain in right wrist: Secondary | ICD-10-CM

## 2018-10-05 DIAGNOSIS — Z5181 Encounter for therapeutic drug level monitoring: Secondary | ICD-10-CM | POA: Insufficient documentation

## 2018-10-05 DIAGNOSIS — Z79891 Long term (current) use of opiate analgesic: Secondary | ICD-10-CM | POA: Insufficient documentation

## 2018-10-05 DIAGNOSIS — M25521 Pain in right elbow: Secondary | ICD-10-CM | POA: Insufficient documentation

## 2018-10-05 DIAGNOSIS — G894 Chronic pain syndrome: Secondary | ICD-10-CM

## 2018-10-05 DIAGNOSIS — Z1231 Encounter for screening mammogram for malignant neoplasm of breast: Secondary | ICD-10-CM

## 2018-10-05 DIAGNOSIS — M542 Cervicalgia: Secondary | ICD-10-CM | POA: Insufficient documentation

## 2018-10-05 DIAGNOSIS — M19021 Primary osteoarthritis, right elbow: Secondary | ICD-10-CM | POA: Diagnosis not present

## 2018-10-05 DIAGNOSIS — M25532 Pain in left wrist: Secondary | ICD-10-CM | POA: Insufficient documentation

## 2018-10-05 DIAGNOSIS — M545 Low back pain: Secondary | ICD-10-CM | POA: Diagnosis not present

## 2018-10-05 DIAGNOSIS — M25511 Pain in right shoulder: Secondary | ICD-10-CM

## 2018-10-11 ENCOUNTER — Encounter: Payer: Medicare Other | Attending: Physical Medicine & Rehabilitation | Admitting: Registered Nurse

## 2018-10-11 ENCOUNTER — Encounter: Payer: Self-pay | Admitting: Registered Nurse

## 2018-10-11 VITALS — BP 110/74 | HR 96 | Ht 67.0 in | Wt 146.0 lb

## 2018-10-11 DIAGNOSIS — K648 Other hemorrhoids: Secondary | ICD-10-CM | POA: Insufficient documentation

## 2018-10-11 DIAGNOSIS — Z86010 Personal history of colonic polyps: Secondary | ICD-10-CM | POA: Insufficient documentation

## 2018-10-11 DIAGNOSIS — R000 Tachycardia, unspecified: Secondary | ICD-10-CM | POA: Insufficient documentation

## 2018-10-11 DIAGNOSIS — G5793 Unspecified mononeuropathy of bilateral lower limbs: Secondary | ICD-10-CM

## 2018-10-11 DIAGNOSIS — G8929 Other chronic pain: Secondary | ICD-10-CM | POA: Diagnosis not present

## 2018-10-11 DIAGNOSIS — K449 Diaphragmatic hernia without obstruction or gangrene: Secondary | ICD-10-CM | POA: Insufficient documentation

## 2018-10-11 DIAGNOSIS — M5412 Radiculopathy, cervical region: Secondary | ICD-10-CM | POA: Diagnosis not present

## 2018-10-11 DIAGNOSIS — M545 Low back pain: Secondary | ICD-10-CM | POA: Diagnosis not present

## 2018-10-11 DIAGNOSIS — F419 Anxiety disorder, unspecified: Secondary | ICD-10-CM | POA: Diagnosis not present

## 2018-10-11 DIAGNOSIS — I951 Orthostatic hypotension: Secondary | ICD-10-CM | POA: Insufficient documentation

## 2018-10-11 DIAGNOSIS — K222 Esophageal obstruction: Secondary | ICD-10-CM | POA: Insufficient documentation

## 2018-10-11 DIAGNOSIS — M797 Fibromyalgia: Secondary | ICD-10-CM | POA: Diagnosis not present

## 2018-10-11 DIAGNOSIS — K0889 Other specified disorders of teeth and supporting structures: Secondary | ICD-10-CM | POA: Diagnosis not present

## 2018-10-11 DIAGNOSIS — K219 Gastro-esophageal reflux disease without esophagitis: Secondary | ICD-10-CM | POA: Insufficient documentation

## 2018-10-11 DIAGNOSIS — R002 Palpitations: Secondary | ICD-10-CM | POA: Diagnosis not present

## 2018-10-11 DIAGNOSIS — Z5181 Encounter for therapeutic drug level monitoring: Secondary | ICD-10-CM

## 2018-10-11 DIAGNOSIS — M47892 Other spondylosis, cervical region: Secondary | ICD-10-CM | POA: Diagnosis not present

## 2018-10-11 DIAGNOSIS — M5136 Other intervertebral disc degeneration, lumbar region: Secondary | ICD-10-CM | POA: Diagnosis not present

## 2018-10-11 DIAGNOSIS — R51 Headache: Secondary | ICD-10-CM | POA: Insufficient documentation

## 2018-10-11 DIAGNOSIS — F329 Major depressive disorder, single episode, unspecified: Secondary | ICD-10-CM | POA: Insufficient documentation

## 2018-10-11 DIAGNOSIS — R55 Syncope and collapse: Secondary | ICD-10-CM | POA: Insufficient documentation

## 2018-10-11 DIAGNOSIS — Z87891 Personal history of nicotine dependence: Secondary | ICD-10-CM | POA: Diagnosis not present

## 2018-10-11 DIAGNOSIS — Z79891 Long term (current) use of opiate analgesic: Secondary | ICD-10-CM

## 2018-10-11 DIAGNOSIS — M542 Cervicalgia: Secondary | ICD-10-CM

## 2018-10-11 DIAGNOSIS — M792 Neuralgia and neuritis, unspecified: Secondary | ICD-10-CM

## 2018-10-11 DIAGNOSIS — K589 Irritable bowel syndrome without diarrhea: Secondary | ICD-10-CM | POA: Insufficient documentation

## 2018-10-11 DIAGNOSIS — M4716 Other spondylosis with myelopathy, lumbar region: Secondary | ICD-10-CM | POA: Diagnosis not present

## 2018-10-11 DIAGNOSIS — Z760 Encounter for issue of repeat prescription: Secondary | ICD-10-CM | POA: Insufficient documentation

## 2018-10-11 DIAGNOSIS — M2550 Pain in unspecified joint: Secondary | ICD-10-CM

## 2018-10-11 DIAGNOSIS — Z599 Problem related to housing and economic circumstances, unspecified: Secondary | ICD-10-CM | POA: Diagnosis not present

## 2018-10-11 MED ORDER — HYDROCODONE-ACETAMINOPHEN 7.5-325 MG PO TABS: 2.0000 | ORAL_TABLET | Freq: Three times a day (TID) | ORAL | 0 refills | Status: DC | PRN

## 2018-10-11 NOTE — Patient Instructions (Signed)
Increase Gabapentin: Take one capsule in the morning and afternoon and two capsules at Bedtime.   Call or send a  My Chart Message next week for medication evaluation:   [phone]

## 2018-10-11 NOTE — Progress Notes (Signed)
Subjective:    Patient ID: [name], female    DOB: 25-Jan-1976, 43 y.o.   MRN: [id number]  HPI: [name] is a 43 y.o. female who returns for follow up appointment for chronic pain and medication refill. She states her pain is located in her neck radiating into her bilateral shoulders and lower back pain. Also reports tingling and burning in her bilateral hands and bilateral feet. She rates her pain 6. Her current exercise regime is walking and performing stretching exercises.  Ms. Bo Morphine equivalent is 45.00 MME. She is also prescribed Clonazepam  by Dr. Gerarda Fraction, reports she is using it sparingly, last fill date 08/16/2018 .We have discussed the black box warning of using opioids and benzodiazepines. I highlighted the dangers of using these drugs together and discussed the adverse events including respiratory suppression, overdose, cognitive impairment and importance of compliance with current regimen. We will continue to monitor and adjust as indicated. .   Last UDS on 07/12/2018, see note for details.   Pain Inventory Average Pain 7 Pain Right Now 6 My pain is intermittent, sharp, burning, dull, stabbing, tingling and aching  In the last 24 hours, has pain interfered with the following? General activity 7 Relation with others 7 Enjoyment of life 7 What TIME of day is your pain at its worst? night Sleep (in general) Fair  Pain is worse with: walking, bending, sitting, inactivity, standing and some activites Pain improves with: rest, heat/ice, medication and TENS Relief from Meds: 6  Mobility walk without assistance ability to climb steps?  yes do you drive?  yes  Function disabled: date disabled .  Neuro/Psych weakness numbness tingling trouble walking spasms confusion anxiety  Prior Studies Any changes since last visit?  no  Physicians involved in your care Any changes since last visit?  no   Family History  Problem Relation Age of Onset  .  Diabetes type II Mother   . Colon polyps Mother   . Diverticulitis Mother   . Diabetes Mother   . Heart disease Mother   . Osteoporosis Father   . Diabetes type II Brother   . Uterine cancer Other   . Ovarian cancer Other   . COPD Paternal Grandfather   . Heart attack Maternal Grandmother    Social History   Socioeconomic History  . Marital status: Married    Spouse name: Not on file  . Number of children: 2  . Years of education: Not on file  . Highest education level: Not on file  Occupational History  . Occupation: Disability    Employer: UNEMPLOYED  Social Needs  . Financial resource strain: Not on file  . Food insecurity:    Worry: Not on file    Inability: Not on file  . Transportation needs:    Medical: Not on file    Non-medical: Not on file  Tobacco Use  . Smoking status: Former Smoker    Types: Cigarettes    Last attempt to quit: 06/30/2011    Years since quitting: 7.2  . Smokeless tobacco: Never Used  Substance and Sexual Activity  . Alcohol use: No    Alcohol/week: 0.0 standard drinks  . Drug use: No  . Sexual activity: Yes  Lifestyle  . Physical activity:    Days per week: Not on file    Minutes per session: Not on file  . Stress: Not on file  Relationships  . Social connections:    Talks on phone: Not on  file    Gets together: Not on file    Attends religious service: Not on file    Active member of club or organization: Not on file    Attends meetings of clubs or organizations: Not on file    Relationship status: Not on file  Other Topics Concern  . Not on file  Social History Narrative  . Not on file   Past Surgical History:  Procedure Laterality Date  . CESAREAN SECTION    . Left elbow surgery    . PARTIAL HYSTERECTOMY    . TONSILLECTOMY     Past Medical History:  Diagnosis Date  . Anxiety   . Chronic pain    Dr. Letta Pate  . Degenerative disc disease    Spinal, small syrinx  . Depression   . Esophageal stricture    Status  post dilatation  . Fibromyalgia   . GERD (gastroesophageal reflux disease)   . Hiatal hernia   . Internal hemorrhoids without mention of complication   . Irritable bowel syndrome   . Palpitations    No documented arrhythmias  . Personal history of colonic polyps 10/04/2000   HYPERPLASTIC POLYP  . POTS (postural orthostatic tachycardia syndrome)   . Syncope    Negative tilt table test  . Syringomyelia and syringobulbia (HCC)    BP 110/74   Pulse 96   Ht 5' 7"  (1.702 m)   Wt 146 lb (66.2 kg)   SpO2 98%   BMI 22.87 kg/m   Opioid Risk Score:   Fall Risk Score:  `1  Depression screen PHQ 2/9  Depression screen [facility] 2/9 04/25/2018 09/20/2017 07/22/2017 05/24/2017 05/26/2016 02/24/2016 04/23/2015  Decreased Interest 1 1 1 1 1 1 1   Down, Depressed, Hopeless 1 1 1 1 1 1 1   PHQ - 2 Score 2 2 2 2 2 2 2   Altered sleeping - - - - - - -  Tired, decreased energy - - - - - - -  Change in appetite - - - - - - -  Feeling bad or failure about yourself  - - - - - - -  Trouble concentrating - - - - - - -  Moving slowly or fidgety/restless - - - - - - -  Suicidal thoughts - - - - - - -  PHQ-9 Score - - - - - - -  Some recent data might be hidden     Review of Systems  Constitutional: Positive for diaphoresis and unexpected weight change.  HENT: Negative.   Eyes: Negative.   Respiratory: Negative.   Cardiovascular: Negative.   Gastrointestinal: Negative.   Endocrine: Negative.   Genitourinary: Negative.   Musculoskeletal: Positive for arthralgias, gait problem and myalgias.  Skin: Positive for rash.  Allergic/Immunologic: Negative.   Neurological: Positive for weakness and numbness.  Hematological: Negative.   Psychiatric/Behavioral: Positive for confusion. The patient is nervous/anxious.   All other systems reviewed and are negative.      Objective:   Physical Exam Vitals signs and nursing note reviewed.  Constitutional:      Appearance: Normal appearance.  Neck:      Musculoskeletal: Normal range of motion and neck supple.  Cardiovascular:     Rate and Rhythm: Normal rate and regular rhythm.     Pulses: Normal pulses.     Heart sounds: Normal heart sounds.  Pulmonary:     Effort: Pulmonary effort is normal.     Breath sounds: Normal breath sounds.  Musculoskeletal:  Comments: Normal Muscle Bulk and Muscle Testing Reveals:  Upper Extremities: Full ROM and Muscle Strength 5/5  Bilateral AC Joint Tenderness Lumbar Paraspinal Tenderness: L-4-L-5 Lower Extremities: Full ROM and Muscle Strength 5/5 Arises from table with ease Narrow Based Gait   Skin:    General: Skin is warm and dry.  Neurological:     Mental Status: She is alert and oriented to person, place, and time.  Psychiatric:        Mood and Affect: Mood normal.        Behavior: Behavior normal.           Assessment & Plan:  1. Lumbar degenerative disc disease:10/11/2018. Continue with current treatment regimen:Continue:HYDROcodone 7.5/325 mg two tablets every 8 hours as needed #180. We will continue the opioid monitoring program, this consists of regular clinic visits, examinations, urine drug screen, pill counts as well as use of [place] Controlled Substance Reporting System.  2. Thoracic Spondylosis: Continuecurrent treatment withHEP and current medication regime. 10/11/2018. 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 Lyrica discontinued due to adverse effects. Continue with exercise and heat therapy.10/11/2018. 4. Fibromyalgia:Continue with Heat and exerciseRegimen. Continue Gabapentin. 10/11/2018 5.Depression and anxiety: PCP Following.10/11/2018 6.Orthostatic hypotension: Cardiology Following.10/11/2018 7. Polyarthralgia: Continueto monitor. 10/11/2018 8. Chronic Shoulder Pain:  Continue current medication regime. Continue HEP as Tolerated.10/11/2018. 9. Muscle  Spasm:ContinueRobaxin.10/11/2018. 10.. Neuropathic Pain: Continue:Gabapentin. 10/11/2018  67mnutes of face to face patient care time was spent during this visit. All questions were encouraged and answered.  F/U in 1 month

## 2018-10-19 LAB — TOXASSURE SELECT,+ANTIDEPR,UR

## 2018-10-27 ENCOUNTER — Telehealth: Payer: Self-pay | Admitting: *Deleted

## 2018-10-27 NOTE — Telephone Encounter (Signed)
Urine drug screen for this encounter is consistent for prescribed medication 

## 2018-11-09 ENCOUNTER — Encounter: Payer: Medicare Other | Attending: Physical Medicine & Rehabilitation | Admitting: Registered Nurse

## 2018-11-09 ENCOUNTER — Other Ambulatory Visit: Payer: Self-pay

## 2018-11-09 VITALS — BP 110/73 | HR 84 | Ht 67.0 in | Wt 146.8 lb

## 2018-11-09 DIAGNOSIS — M542 Cervicalgia: Secondary | ICD-10-CM | POA: Diagnosis not present

## 2018-11-09 DIAGNOSIS — K0889 Other specified disorders of teeth and supporting structures: Secondary | ICD-10-CM | POA: Insufficient documentation

## 2018-11-09 DIAGNOSIS — K648 Other hemorrhoids: Secondary | ICD-10-CM | POA: Insufficient documentation

## 2018-11-09 DIAGNOSIS — R51 Headache: Secondary | ICD-10-CM | POA: Insufficient documentation

## 2018-11-09 DIAGNOSIS — M47814 Spondylosis without myelopathy or radiculopathy, thoracic region: Secondary | ICD-10-CM

## 2018-11-09 DIAGNOSIS — M545 Low back pain: Secondary | ICD-10-CM | POA: Insufficient documentation

## 2018-11-09 DIAGNOSIS — M797 Fibromyalgia: Secondary | ICD-10-CM | POA: Diagnosis not present

## 2018-11-09 DIAGNOSIS — R000 Tachycardia, unspecified: Secondary | ICD-10-CM | POA: Insufficient documentation

## 2018-11-09 DIAGNOSIS — K449 Diaphragmatic hernia without obstruction or gangrene: Secondary | ICD-10-CM | POA: Insufficient documentation

## 2018-11-09 DIAGNOSIS — M4716 Other spondylosis with myelopathy, lumbar region: Secondary | ICD-10-CM

## 2018-11-09 DIAGNOSIS — I951 Orthostatic hypotension: Secondary | ICD-10-CM | POA: Insufficient documentation

## 2018-11-09 DIAGNOSIS — F411 Generalized anxiety disorder: Secondary | ICD-10-CM

## 2018-11-09 DIAGNOSIS — R002 Palpitations: Secondary | ICD-10-CM | POA: Insufficient documentation

## 2018-11-09 DIAGNOSIS — F419 Anxiety disorder, unspecified: Secondary | ICD-10-CM | POA: Insufficient documentation

## 2018-11-09 DIAGNOSIS — G5793 Unspecified mononeuropathy of bilateral lower limbs: Secondary | ICD-10-CM

## 2018-11-09 DIAGNOSIS — M5412 Radiculopathy, cervical region: Secondary | ICD-10-CM

## 2018-11-09 DIAGNOSIS — K219 Gastro-esophageal reflux disease without esophagitis: Secondary | ICD-10-CM | POA: Insufficient documentation

## 2018-11-09 DIAGNOSIS — Z599 Problem related to housing and economic circumstances, unspecified: Secondary | ICD-10-CM | POA: Insufficient documentation

## 2018-11-09 DIAGNOSIS — F329 Major depressive disorder, single episode, unspecified: Secondary | ICD-10-CM | POA: Insufficient documentation

## 2018-11-09 DIAGNOSIS — M47892 Other spondylosis, cervical region: Secondary | ICD-10-CM | POA: Insufficient documentation

## 2018-11-09 DIAGNOSIS — K589 Irritable bowel syndrome without diarrhea: Secondary | ICD-10-CM | POA: Insufficient documentation

## 2018-11-09 DIAGNOSIS — M2550 Pain in unspecified joint: Secondary | ICD-10-CM

## 2018-11-09 DIAGNOSIS — R55 Syncope and collapse: Secondary | ICD-10-CM | POA: Insufficient documentation

## 2018-11-09 DIAGNOSIS — Z79891 Long term (current) use of opiate analgesic: Secondary | ICD-10-CM

## 2018-11-09 DIAGNOSIS — G894 Chronic pain syndrome: Secondary | ICD-10-CM

## 2018-11-09 DIAGNOSIS — Z87891 Personal history of nicotine dependence: Secondary | ICD-10-CM | POA: Insufficient documentation

## 2018-11-09 DIAGNOSIS — Z86010 Personal history of colonic polyps: Secondary | ICD-10-CM | POA: Insufficient documentation

## 2018-11-09 DIAGNOSIS — K222 Esophageal obstruction: Secondary | ICD-10-CM | POA: Insufficient documentation

## 2018-11-09 DIAGNOSIS — M5136 Other intervertebral disc degeneration, lumbar region: Secondary | ICD-10-CM | POA: Insufficient documentation

## 2018-11-09 DIAGNOSIS — Z5181 Encounter for therapeutic drug level monitoring: Secondary | ICD-10-CM

## 2018-11-09 DIAGNOSIS — Z760 Encounter for issue of repeat prescription: Secondary | ICD-10-CM | POA: Insufficient documentation

## 2018-11-09 DIAGNOSIS — G8929 Other chronic pain: Secondary | ICD-10-CM | POA: Insufficient documentation

## 2018-11-09 DIAGNOSIS — M792 Neuralgia and neuritis, unspecified: Secondary | ICD-10-CM

## 2018-11-09 MED ORDER — MELOXICAM 7.5 MG PO TABS: 7.5000 mg | ORAL_TABLET | Freq: Every day | ORAL | 3 refills | Status: DC

## 2018-11-09 MED ORDER — HYDROCODONE-ACETAMINOPHEN 7.5-325 MG PO TABS: 2.0000 | ORAL_TABLET | Freq: Three times a day (TID) | ORAL | 0 refills | Status: DC | PRN

## 2018-11-09 MED ORDER — GABAPENTIN 100 MG PO CAPS: ORAL_CAPSULE | ORAL | 3 refills | Status: DC

## 2018-11-09 NOTE — Progress Notes (Signed)
Subjective:    Patient ID: [name], female    DOB: 1976-05-11, 43 y.o.   MRN: [id number]  HPI: [name] is a 43 y.o. female her appointment was changed, due to national recommendations of social distancing due to [place] 19, an audio/video telehealth visit is felt to be most appropriate for this patient at this time.  See Chart message from today for the patient's consent to telehealth from [place].  She states her pain is located in her head ( headache), neck radiating into her bilateral shoulders and mid- lower back pain.Also reports neuropathic pain in her bilateral hands and bilateral feet and joint pain. She rates her pain 6. Her current exercise regime is walking and performing stretching exercises.  Ms. Smits Morphine equivalent is 45.00  MME. She  is also prescribed Clonazepam  by Dr.Mann. We have discussed the black box warning of using opioids and benzodiazepines. I highlighted the dangers of using these drugs together and discussed the adverse events including respiratory suppression, overdose, cognitive impairment and importance of compliance with current regimen. We will continue to monitor and adjust as indicated.    Last UDS was Performed on 10/11/2018, it was consistent.   Kennon Rounds CMA asked the Health and History Questions. This provider and Kennon Rounds  verified we were  speaking with the correct person using two identifiers.  Pain Inventory Average Pain 7 Pain Right Now 6 My pain is burning and aching  In the last 24 hours, has pain interfered with the following? General activity 6 Relation with others 6 Enjoyment of life 6 What TIME of day is your pain at its worst? evening and night Sleep (in general) Poor  Pain is worse with: inactivity Pain improves with: rest and medication Relief from Meds: 6  Mobility how many minutes can you walk? 30 ability to climb steps?  yes do you drive?  yes  Function  disabled: date disabled na I need assistance with the following:  meal prep and household duties  Neuro/Psych numbness depression anxiety  Prior Studies Any changes since last visit?  no  Physicians involved in your care Primary care Collene Mares. NP   Family History  Problem Relation Age of Onset  . Diabetes type II Mother   . Colon polyps Mother   . Diverticulitis Mother   . Diabetes Mother   . Heart disease Mother   . Osteoporosis Father   . Diabetes type II Brother   . Uterine cancer Other   . Ovarian cancer Other   . COPD Paternal Grandfather   . Heart attack Maternal Grandmother    Social History   Socioeconomic History  . Marital status: Married    Spouse name: Not on file  . Number of children: 2  . Years of education: Not on file  . Highest education level: Not on file  Occupational History  . Occupation: Disability    Employer: UNEMPLOYED  Social Needs  . Financial resource strain: Not on file  . Food insecurity:    Worry: Not on file    Inability: Not on file  . Transportation needs:    Medical: Not on file    Non-medical: Not on file  Tobacco Use  . Smoking status: Former Smoker    Types: Cigarettes    Last attempt to quit: 06/30/2011    Years since quitting: 7.3  . Smokeless tobacco: Never Used  Substance and Sexual Activity  . Alcohol use: No  Alcohol/week: 0.0 standard drinks  . Drug use: No  . Sexual activity: Yes  Lifestyle  . Physical activity:    Days per week: Not on file    Minutes per session: Not on file  . Stress: Not on file  Relationships  . Social connections:    Talks on phone: Not on file    Gets together: Not on file    Attends religious service: Not on file    Active member of club or organization: Not on file    Attends meetings of clubs or organizations: Not on file    Relationship status: Not on file  Other Topics Concern  . Not on file  Social History Narrative  . Not on file   Past Surgical History:   Procedure Laterality Date  . CESAREAN SECTION    . Left elbow surgery    . PARTIAL HYSTERECTOMY    . TONSILLECTOMY     Past Medical History:  Diagnosis Date  . Anxiety   . Chronic pain    Dr. Letta Pate  . Degenerative disc disease    Spinal, small syrinx  . Depression   . Esophageal stricture    Status post dilatation  . Fibromyalgia   . GERD (gastroesophageal reflux disease)   . Hiatal hernia   . Internal hemorrhoids without mention of complication   . Irritable bowel syndrome   . Palpitations    No documented arrhythmias  . Personal history of colonic polyps 10/04/2000   HYPERPLASTIC POLYP  . POTS (postural orthostatic tachycardia syndrome)   . Syncope    Negative tilt table test  . Syringomyelia and syringobulbia (HCC)    BP 110/73 Comment: pt reported, virtual call visit  Pulse 84 Comment: pt reported, virtual call visit  Ht 5' 7"  (1.702 m)   Wt 146 lb 12.8 oz (66.6 kg)   SpO2 99% Comment: pt reported, virtual call visit  BMI 22.99 kg/m   Opioid Risk Score:   Fall Risk Score:  `1  Depression screen PHQ 2/9  Depression screen [facility] 2/9 11/09/2018 04/25/2018 09/20/2017 07/22/2017 05/24/2017 05/26/2016 02/24/2016  Decreased Interest 1 1 1 1 1 1 1   Down, Depressed, Hopeless 1 1 1 1 1 1 1   PHQ - 2 Score 2 2 2 2 2 2 2   Altered sleeping - - - - - - -  Tired, decreased energy - - - - - - -  Change in appetite - - - - - - -  Feeling bad or failure about yourself  - - - - - - -  Trouble concentrating - - - - - - -  Moving slowly or fidgety/restless - - - - - - -  Suicidal thoughts - - - - - - -  PHQ-9 Score - - - - - - -  Some recent data might be hidden    Review of Systems  Constitutional: Positive for diaphoresis.  HENT: Negative.   Respiratory: Negative.   Cardiovascular: Negative.   Gastrointestinal: Positive for nausea.  Endocrine: Negative.   Genitourinary: Negative.   Musculoskeletal: Positive for back pain and neck pain.  Allergic/Immunologic: Negative.    Neurological: Positive for headaches.  Hematological: Negative.   Psychiatric/Behavioral: Negative.   All other systems reviewed and are negative.      Objective:   Physical Exam Vitals signs and nursing note reviewed.  Musculoskeletal:     Comments: No Physical Exam: Virtual Visit  Neurological:     Mental Status: She is oriented to  person, place, and time.           Assessment & Plan:  1. Lumbar degenerative disc disease:11/09/2018. Continue with current treatment regimen:Continue:HYDROcodone 7.5/325 mg two tablets every 8 hours as needed #180. We will continue the opioid monitoring program, this consists of regular clinic visits, examinations, urine drug screen, pill counts as well as use of [place] Controlled Substance Reporting System.  2. Thoracic Spondylosis: Continuecurrent treatment withHEP and current medication regime. 11/09/2018. 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 Lyrica discontinued due to adverse effects. Continue with exercise and heat therapy.11/09/2018. 4. Fibromyalgia:Continue with Heat and exerciseRegimen.ContinueGabapentin. 11/09/2018 5.Depression and anxiety: PCP Following.11/09/2018 6.Orthostatic hypotension: Cardiology Following.11/09/2018 7. Polyarthralgia: Continueto monitor. 11/09/2018 8. Chronic Shoulder Pain: Continue current medication regime. Continue HEP as Tolerated.11/09/2018. 9. Muscle Spasm:ContinueRobaxin.11/09/2018. 10. Neuropathic Pain:Continue:Gabapentin. 11/09/2018  F/U in 1 month  [employer] Location of patient: in her Home Location of provider: Office Established patient Time spent on call: 15 minutes

## 2018-11-13 ENCOUNTER — Encounter: Payer: Self-pay | Admitting: Registered Nurse

## 2018-11-29 DIAGNOSIS — Z79899 Other long term (current) drug therapy: Secondary | ICD-10-CM | POA: Diagnosis not present

## 2018-11-29 DIAGNOSIS — E063 Autoimmune thyroiditis: Secondary | ICD-10-CM | POA: Diagnosis not present

## 2018-11-29 DIAGNOSIS — E039 Hypothyroidism, unspecified: Secondary | ICD-10-CM | POA: Diagnosis not present

## 2018-11-29 DIAGNOSIS — K219 Gastro-esophageal reflux disease without esophagitis: Secondary | ICD-10-CM | POA: Diagnosis not present

## 2018-11-29 DIAGNOSIS — Z6823 Body mass index (BMI) 23.0-23.9, adult: Secondary | ICD-10-CM | POA: Diagnosis not present

## 2018-11-29 DIAGNOSIS — M2550 Pain in unspecified joint: Secondary | ICD-10-CM | POA: Diagnosis not present

## 2018-11-29 DIAGNOSIS — I10 Essential (primary) hypertension: Secondary | ICD-10-CM | POA: Diagnosis not present

## 2018-11-30 ENCOUNTER — Other Ambulatory Visit: Payer: Self-pay

## 2018-11-30 ENCOUNTER — Encounter: Payer: Self-pay | Admitting: Registered Nurse

## 2018-11-30 ENCOUNTER — Encounter (HOSPITAL_BASED_OUTPATIENT_CLINIC_OR_DEPARTMENT_OTHER): Payer: Medicare Other | Admitting: Registered Nurse

## 2018-11-30 VITALS — BP 109/80 | HR 88 | Ht 67.0 in | Wt 146.0 lb

## 2018-11-30 DIAGNOSIS — M792 Neuralgia and neuritis, unspecified: Secondary | ICD-10-CM

## 2018-11-30 DIAGNOSIS — Z5181 Encounter for therapeutic drug level monitoring: Secondary | ICD-10-CM

## 2018-11-30 DIAGNOSIS — G894 Chronic pain syndrome: Secondary | ICD-10-CM

## 2018-11-30 DIAGNOSIS — M25521 Pain in right elbow: Secondary | ICD-10-CM | POA: Diagnosis not present

## 2018-11-30 DIAGNOSIS — M4716 Other spondylosis with myelopathy, lumbar region: Secondary | ICD-10-CM

## 2018-11-30 DIAGNOSIS — M170 Bilateral primary osteoarthritis of knee: Secondary | ICD-10-CM | POA: Diagnosis not present

## 2018-11-30 DIAGNOSIS — M797 Fibromyalgia: Secondary | ICD-10-CM

## 2018-11-30 DIAGNOSIS — Z79891 Long term (current) use of opiate analgesic: Secondary | ICD-10-CM

## 2018-11-30 DIAGNOSIS — M2550 Pain in unspecified joint: Secondary | ICD-10-CM | POA: Diagnosis not present

## 2018-11-30 MED ORDER — HYDROCODONE-ACETAMINOPHEN 7.5-325 MG PO TABS: 2.0000 | ORAL_TABLET | Freq: Three times a day (TID) | ORAL | 0 refills | Status: DC | PRN

## 2018-11-30 NOTE — Progress Notes (Signed)
Subjective:    Patient ID: [name], female    DOB: 28-Oct-1975, 43 y.o.   MRN: [id number]  HPI: [name] is a 43 y.o. female  Her appointment was changed, due to national recommendations of social distancing due to [place] 19, an audio/video telehealth visit is felt to be most appropriate for this patient at this time.  See Chart message from today for the patient's consent to telehealth from [place].     She states her pain is located in her right elbow, lower back, bilateral lower extremities ( reports soreness) and bilateral knees. She rates her  Pain 7. Her current exercise regime is walking she was encouraged to increase HEP as tolerated, she verbalizes understanding.   [name] Morphine equivalent is 45.00 MME. She is also prescribed Clonazepam by Dr. Collene Mares. We have discussed the black box warning of using opioids and benzodiazepines. I highlighted the dangers of using these drugs together and discussed the adverse events including respiratory suppression, overdose, cognitive impairment and importance of compliance with current regimen. We will continue to monitor and adjust as indicated.   Jasmine December CMA asked the Health and History Questions. This provide and Kennon Rounds verified we were speaking with the correct person using two identifiers.  Pain Inventory Average Pain 7 Pain Right Now 7 My pain is burning and aching  In the last 24 hours, has pain interfered with the following? General activity 7 Relation with others 7 Enjoyment of life 7 What TIME of day is your pain at its worst? evening and night Sleep (in general) Poor  Pain is worse with: inactivity Pain improves with: rest and medication Relief from Meds: 6  Mobility how many minutes can you walk? 30 ability to climb steps?  yes do you drive?  yes  Function disabled: date disabled na I need assistance with the following:  meal prep and household duties   Neuro/Psych numbness depression anxiety  Prior Studies Any changes since last visit?  no  Physicians involved in your care Primary care Dr. Redmond [employer]   Family History  Problem Relation Age of Onset  . Diabetes type II Mother   . Colon polyps Mother   . Diverticulitis Mother   . Diabetes Mother   . Heart disease Mother   . Osteoporosis Father   . Diabetes type II Brother   . Uterine cancer Other   . Ovarian cancer Other   . COPD Paternal Grandfather   . Heart attack Maternal Grandmother    Social History   Socioeconomic History  . Marital status: Married    Spouse name: Not on file  . Number of children: 2  . Years of education: Not on file  . Highest education level: Not on file  Occupational History  . Occupation: Disability    Employer: UNEMPLOYED  Social Needs  . Financial resource strain: Not on file  . Food insecurity:    Worry: Not on file    Inability: Not on file  . Transportation needs:    Medical: Not on file    Non-medical: Not on file  Tobacco Use  . Smoking status: Former Smoker    Types: Cigarettes    Last attempt to quit: 06/30/2011    Years since quitting: 7.4  . Smokeless tobacco: Never Used  Substance and Sexual Activity  . Alcohol use: No    Alcohol/week: 0.0 standard drinks  . Drug use: No  . Sexual activity: Yes  Lifestyle  . Physical activity:    Days per week: Not on file    Minutes per session: Not on file  . Stress: Not on file  Relationships  . Social connections:    Talks on phone: Not on file    Gets together: Not on file    Attends religious service: Not on file    Active member of club or organization: Not on file    Attends meetings of clubs or organizations: Not on file    Relationship status: Not on file  Other Topics Concern  . Not on file  Social History Narrative  . Not on file   Past Surgical History:  Procedure Laterality Date  . CESAREAN SECTION    . Left elbow surgery    . PARTIAL HYSTERECTOMY     . TONSILLECTOMY     Past Medical History:  Diagnosis Date  . Anxiety   . Chronic pain    Dr. Letta Pate  . Degenerative disc disease    Spinal, small syrinx  . Depression   . Esophageal stricture    Status post dilatation  . Fibromyalgia   . GERD (gastroesophageal reflux disease)   . Hiatal hernia   . Internal hemorrhoids without mention of complication   . Irritable bowel syndrome   . Palpitations    No documented arrhythmias  . Personal history of colonic polyps 10/04/2000   HYPERPLASTIC POLYP  . POTS (postural orthostatic tachycardia syndrome)   . Syncope    Negative tilt table test  . Syringomyelia and syringobulbia (HCC)    BP 109/80   Pulse 88   Ht 5' 7"  (1.702 m)   Wt 146 lb (66.2 kg)   SpO2 99%   BMI 22.87 kg/m   Opioid Risk Score:   Fall Risk Score:  `1  Depression screen PHQ 2/9  Depression screen [facility] 2/9 11/09/2018 04/25/2018 09/20/2017 07/22/2017 05/24/2017 05/26/2016 02/24/2016  Decreased Interest 1 1 1 1 1 1 1   Down, Depressed, Hopeless 1 1 1 1 1 1 1   PHQ - 2 Score 2 2 2 2 2 2 2   Altered sleeping - - - - - - -  Tired, decreased energy - - - - - - -  Change in appetite - - - - - - -  Feeling bad or failure about yourself  - - - - - - -  Trouble concentrating - - - - - - -  Moving slowly or fidgety/restless - - - - - - -  Suicidal thoughts - - - - - - -  PHQ-9 Score - - - - - - -  Some recent data might be hidden    Review of Systems  Constitutional: Positive for diaphoresis.  HENT: Negative.   Eyes: Negative.   Respiratory: Negative.   Cardiovascular: Negative.   Gastrointestinal: Negative.   Endocrine: Negative.   Genitourinary: Negative.   Musculoskeletal: Positive for back pain and neck pain.  Skin: Negative.   Allergic/Immunologic: Negative.   Neurological: Positive for numbness and headaches.  Hematological: Negative.   Psychiatric/Behavioral: Positive for dysphoric mood. The patient is nervous/anxious.   All other systems reviewed and  are negative.      Objective:   Physical Exam Vitals signs and nursing note reviewed.  Musculoskeletal:     Comments: No Physical Exam Perform: Virtual Visit  Neurological:     Mental Status: She is oriented to person, place, and time.           Assessment &  Plan:  1. Lumbar degenerative disc disease:11/30/2018. Continue with current treatment regimen:Continue:HYDROcodone 7.5/325 mg two tablets every 8 hours as needed #180. We will continue the opioid monitoring program, this consists of regular clinic visits, examinations, urine drug screen, pill counts as well as use of [place] Controlled Substance Reporting System.  2. Thoracic Spondylosis: Continuecurrent treatment withHEP and current medication regime. 11/30/2018. 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 Lyrica discontinued due to adverse effects. Continue with exercise and heat therapy.11/30/2018. 4. Fibromyalgia:Continue with Heat and exerciseRegimen.ContinueGabapentin.11/30/2018 5.Depression and anxiety: PCP Following.11/30/2018 6.Orthostatic hypotension: Cardiology Following.11/09/2018 7. Polyarthralgia: Continueto monitor. 11/30/2018 8. Chronic Shoulder Pain:No complaints today. Continue current medication regime. Continue HEP as Tolerated.11/30/2018. 9. Muscle Spasm:ContinueRobaxin.11/30/2018. 10. Neuropathic Pain:Continue:Gabapentin. 11/30/2018 11. Right elbow Pain: Continue current medication regimen. Continue to monitor.  F/U in 1 month 12. Bilateral Knee with OA: Continue current medication regimen. Continue to monitor.    [employer] Location of patient: in her Home Location of provider: Office Established patient Time spent on call: 20 minutes

## 2018-12-06 ENCOUNTER — Ambulatory Visit: Payer: Medicare Other | Admitting: Psychology

## 2019-01-03 ENCOUNTER — Encounter: Payer: Self-pay | Admitting: Registered Nurse

## 2019-01-03 ENCOUNTER — Other Ambulatory Visit: Payer: Self-pay

## 2019-01-03 ENCOUNTER — Encounter: Payer: Medicare Other | Attending: Physical Medicine & Rehabilitation | Admitting: Registered Nurse

## 2019-01-03 VITALS — BP 131/87 | HR 99 | Temp 98.1°F | Ht 67.0 in | Wt 147.8 lb

## 2019-01-03 DIAGNOSIS — K0889 Other specified disorders of teeth and supporting structures: Secondary | ICD-10-CM | POA: Diagnosis not present

## 2019-01-03 DIAGNOSIS — K222 Esophageal obstruction: Secondary | ICD-10-CM | POA: Diagnosis not present

## 2019-01-03 DIAGNOSIS — Z86010 Personal history of colonic polyps: Secondary | ICD-10-CM | POA: Diagnosis not present

## 2019-01-03 DIAGNOSIS — M5136 Other intervertebral disc degeneration, lumbar region: Secondary | ICD-10-CM | POA: Insufficient documentation

## 2019-01-03 DIAGNOSIS — Z760 Encounter for issue of repeat prescription: Secondary | ICD-10-CM | POA: Diagnosis not present

## 2019-01-03 DIAGNOSIS — Z87891 Personal history of nicotine dependence: Secondary | ICD-10-CM | POA: Diagnosis not present

## 2019-01-03 DIAGNOSIS — F419 Anxiety disorder, unspecified: Secondary | ICD-10-CM | POA: Diagnosis not present

## 2019-01-03 DIAGNOSIS — R55 Syncope and collapse: Secondary | ICD-10-CM | POA: Diagnosis not present

## 2019-01-03 DIAGNOSIS — K219 Gastro-esophageal reflux disease without esophagitis: Secondary | ICD-10-CM | POA: Insufficient documentation

## 2019-01-03 DIAGNOSIS — Z599 Problem related to housing and economic circumstances, unspecified: Secondary | ICD-10-CM | POA: Diagnosis not present

## 2019-01-03 DIAGNOSIS — M25512 Pain in left shoulder: Secondary | ICD-10-CM

## 2019-01-03 DIAGNOSIS — M2550 Pain in unspecified joint: Secondary | ICD-10-CM

## 2019-01-03 DIAGNOSIS — M1711 Unilateral primary osteoarthritis, right knee: Secondary | ICD-10-CM | POA: Diagnosis not present

## 2019-01-03 DIAGNOSIS — R000 Tachycardia, unspecified: Secondary | ICD-10-CM | POA: Diagnosis not present

## 2019-01-03 DIAGNOSIS — I951 Orthostatic hypotension: Secondary | ICD-10-CM | POA: Diagnosis not present

## 2019-01-03 DIAGNOSIS — M47892 Other spondylosis, cervical region: Secondary | ICD-10-CM | POA: Diagnosis not present

## 2019-01-03 DIAGNOSIS — M545 Low back pain: Secondary | ICD-10-CM | POA: Diagnosis not present

## 2019-01-03 DIAGNOSIS — R002 Palpitations: Secondary | ICD-10-CM | POA: Insufficient documentation

## 2019-01-03 DIAGNOSIS — M25511 Pain in right shoulder: Secondary | ICD-10-CM

## 2019-01-03 DIAGNOSIS — G8929 Other chronic pain: Secondary | ICD-10-CM | POA: Diagnosis not present

## 2019-01-03 DIAGNOSIS — R51 Headache: Secondary | ICD-10-CM | POA: Diagnosis not present

## 2019-01-03 DIAGNOSIS — M4716 Other spondylosis with myelopathy, lumbar region: Secondary | ICD-10-CM | POA: Diagnosis not present

## 2019-01-03 DIAGNOSIS — M542 Cervicalgia: Secondary | ICD-10-CM | POA: Insufficient documentation

## 2019-01-03 DIAGNOSIS — F329 Major depressive disorder, single episode, unspecified: Secondary | ICD-10-CM | POA: Insufficient documentation

## 2019-01-03 DIAGNOSIS — M797 Fibromyalgia: Secondary | ICD-10-CM

## 2019-01-03 DIAGNOSIS — K648 Other hemorrhoids: Secondary | ICD-10-CM | POA: Diagnosis not present

## 2019-01-03 DIAGNOSIS — Z79891 Long term (current) use of opiate analgesic: Secondary | ICD-10-CM

## 2019-01-03 DIAGNOSIS — K449 Diaphragmatic hernia without obstruction or gangrene: Secondary | ICD-10-CM | POA: Diagnosis not present

## 2019-01-03 DIAGNOSIS — Z5181 Encounter for therapeutic drug level monitoring: Secondary | ICD-10-CM

## 2019-01-03 DIAGNOSIS — K589 Irritable bowel syndrome without diarrhea: Secondary | ICD-10-CM | POA: Diagnosis not present

## 2019-01-03 DIAGNOSIS — G894 Chronic pain syndrome: Secondary | ICD-10-CM

## 2019-01-03 DIAGNOSIS — M47814 Spondylosis without myelopathy or radiculopathy, thoracic region: Secondary | ICD-10-CM

## 2019-01-03 MED ORDER — HYDROCODONE-ACETAMINOPHEN 7.5-325 MG PO TABS: 2.0000 | ORAL_TABLET | Freq: Three times a day (TID) | ORAL | 0 refills | Status: DC | PRN

## 2019-01-03 NOTE — Progress Notes (Signed)
Subjective:    Patient ID: [name], female    DOB: [birth year], 43 y.o.   MRN: [id number]  HPI: [name] is a 43 y.o. female who returns for follow up appointment for chronic pain and medication refill. She states her pain is located in her bilateral shoulders L>R, upper- lower back and right knee. Also reports generalized joint pain. She rates her pain 6. Her current exercise regime is walking and performing stretching exercises.  [name] Morphine equivalent is 45.00 MME.  She is also prescribed Clonazepam  by Dr. Collene Mares. We have discussed the black box warning of using opioids and benzodiazepines. I highlighted the dangers of using these drugs together and discussed the adverse events including respiratory suppression, overdose, cognitive impairment and importance of compliance with current regimen. We will continue to monitor and adjust as indicated.   Last UDS was Performed on 10/11/2018, it was consistent.    Pain Inventory Average Pain 6 Pain Right Now 6 My pain is intermittent, sharp, burning, dull, stabbing, tingling and aching  In the last 24 hours, has pain interfered with the following? General activity 6 Relation with others 6 Enjoyment of life 6 What TIME of day is your pain at its worst? all Sleep (in general) Poor  Pain is worse with: inactivity Pain improves with: rest and medication Relief from Meds: 6  Mobility how many minutes can you walk? 30 ability to climb steps?  yes do you drive?  yes  Function disabled: date disabled na I need assistance with the following:  meal prep and household duties  Neuro/Psych numbness depression anxiety  Prior Studies Any changes since last visit?  no  Physicians involved in your care Any changes since last visit?  no   Family History  Problem Relation Age of Onset  . Diabetes type II Mother   . Colon polyps Mother   . Diverticulitis Mother   . Diabetes Mother   . Heart disease Mother   .  Osteoporosis Father   . Diabetes type II Brother   . Uterine cancer Other   . Ovarian cancer Other   . COPD Paternal Grandfather   . Heart attack Maternal Grandmother    Social History   Socioeconomic History  . Marital status: Married    Spouse name: Not on file  . Number of children: 2  . Years of education: Not on file  . Highest education level: Not on file  Occupational History  . Occupation: Disability    Employer: UNEMPLOYED  Social Needs  . Financial resource strain: Not on file  . Food insecurity:    Worry: Not on file    Inability: Not on file  . Transportation needs:    Medical: Not on file    Non-medical: Not on file  Tobacco Use  . Smoking status: Former Smoker    Types: Cigarettes    Last attempt to quit: 06/30/2011    Years since quitting: 7.5  . Smokeless tobacco: Never Used  Substance and Sexual Activity  . Alcohol use: No    Alcohol/week: 0.0 standard drinks  . Drug use: No  . Sexual activity: Yes  Lifestyle  . Physical activity:    Days per week: Not on file    Minutes per session: Not on file  . Stress: Not on file  Relationships  . Social connections:    Talks on phone: Not on file    Gets together: Not on file    Attends religious  service: Not on file    Active member of club or organization: Not on file    Attends meetings of clubs or organizations: Not on file    Relationship status: Not on file  Other Topics Concern  . Not on file  Social History Narrative  . Not on file   Past Surgical History:  Procedure Laterality Date  . CESAREAN SECTION    . Left elbow surgery    . PARTIAL HYSTERECTOMY    . TONSILLECTOMY     Past Medical History:  Diagnosis Date  . Anxiety   . Chronic pain    Dr. Letta Pate  . Degenerative disc disease    Spinal, small syrinx  . Depression   . Esophageal stricture    Status post dilatation  . Fibromyalgia   . GERD (gastroesophageal reflux disease)   . Hiatal hernia   . Internal hemorrhoids without  mention of complication   . Irritable bowel syndrome   . Palpitations    No documented arrhythmias  . Personal history of colonic polyps 10/04/2000   HYPERPLASTIC POLYP  . POTS (postural orthostatic tachycardia syndrome)   . Syncope    Negative tilt table test  . Syringomyelia and syringobulbia ([facility])    BP 131/87   Pulse 99   Temp 98.1 F (36.7 C)   Ht 5' 7"  (1.702 m)   Wt 147 lb 12.8 oz (67 kg)   SpO2 99%   BMI 23.15 kg/m   Opioid Risk Score:   Fall Risk Score:  `1  Depression screen PHQ 2/9  Depression screen [facility] 2/9 01/03/2019 11/09/2018 04/25/2018 09/20/2017 07/22/2017 05/24/2017 05/26/2016  Decreased Interest 1 1 1 1 1 1 1   Down, Depressed, Hopeless 1 1 1 1 1 1 1   PHQ - 2 Score 2 2 2 2 2 2 2   Altered sleeping - - - - - - -  Tired, decreased energy - - - - - - -  Change in appetite - - - - - - -  Feeling bad or failure about yourself  - - - - - - -  Trouble concentrating - - - - - - -  Moving slowly or fidgety/restless - - - - - - -  Suicidal thoughts - - - - - - -  PHQ-9 Score - - - - - - -  Some recent data might be hidden   Review of Systems  Constitutional: Negative.   HENT: Negative.   Eyes: Negative.   Respiratory: Negative.   Cardiovascular: Negative.   Gastrointestinal: Negative.   Endocrine: Negative.   Genitourinary: Negative.   Musculoskeletal: Negative.   Skin: Negative.   Allergic/Immunologic: Negative.   Neurological: Positive for numbness.  Hematological: Negative.   Psychiatric/Behavioral: Positive for dysphoric mood. The patient is nervous/anxious.   All other systems reviewed and are negative.      Objective:   Physical Exam Vitals signs and nursing note reviewed.  Constitutional:      Appearance: Normal appearance.  Neck:     Musculoskeletal: Normal range of motion and neck supple.  Cardiovascular:     Rate and Rhythm: Normal rate and regular rhythm.     Pulses: Normal pulses.     Heart sounds: Normal heart sounds.  Pulmonary:      Effort: Pulmonary effort is normal.     Breath sounds: Normal breath sounds.  Musculoskeletal:     Comments: Normal Muscle Bulk and Muscle Testing Reveals:  Upper Extremities: Full ROM and Muscle Strength 5/5  Thoracic Paraspinal Tenderness: T-7-T-9 Lumbar Paraspinal Tenderness: L-4-L-5 Lower Extremities: Full ROM and Muscle Strength 5/5 Arises from chair with ease Narrow Based Gait   Skin:    General: Skin is warm and dry.  Neurological:     Mental Status: She is alert and oriented to person, place, and time.  Psychiatric:        Mood and Affect: Mood normal.        Behavior: Behavior normal.           Assessment & Plan:  1. Lumbar degenerative disc disease:01/03/2019. Continue with current treatment regimen:Continue:HYDROcodone 7.5/325 mg two tablets every 8 hours as needed #180. We will continue the opioid monitoring program, this consists of regular clinic visits, examinations, urine drug screen, pill counts as well as use of [place] Controlled Substance Reporting System.  2. Thoracic Spondylosis: Continuecurrent treatment withHEP and current medication regime. 01/03/2019. 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 Lyrica discontinued due to adverse effects. Continue with exercise and heat therapy.01/03/2019. 4. Fibromyalgia:Continue with Heat and exerciseRegimen.ContinueGabapentin.01/03/2019 5.Depression and anxiety: PCP Following.01/03/2019 6.Orthostatic hypotension: Cardiology Following.01/03/2019 7. Polyarthralgia: Continueto monitor. 01/03/2019 8. Chronic Bilateral  Shoulder Pain:Continue current medication regime. Continue HEP as Tolerated.01/03/2019. 9. Muscle Spasm:ContinueRobaxin.01/03/2019. 10. Neuropathic Pain:Continue:Gabapentin. 01/03/2019 11. Right elbow Pain: No Complaints Today.Continue current medication regimen. Continue to monitor.  12.  Right Knee with OA: Continue HEP as Tolerated. Continue current medication regimen. Continue to monitor. 01/03/2019  F/U in 1 Month

## 2019-02-02 ENCOUNTER — Encounter: Payer: Medicare Other | Attending: Physical Medicine & Rehabilitation | Admitting: Registered Nurse

## 2019-02-02 ENCOUNTER — Encounter: Payer: Self-pay | Admitting: Registered Nurse

## 2019-02-02 ENCOUNTER — Other Ambulatory Visit: Payer: Self-pay

## 2019-02-02 VITALS — BP 129/75 | HR 87 | Temp 97.8°F | Ht 67.0 in | Wt 145.0 lb

## 2019-02-02 DIAGNOSIS — I951 Orthostatic hypotension: Secondary | ICD-10-CM | POA: Insufficient documentation

## 2019-02-02 DIAGNOSIS — Z599 Problem related to housing and economic circumstances, unspecified: Secondary | ICD-10-CM | POA: Diagnosis not present

## 2019-02-02 DIAGNOSIS — M797 Fibromyalgia: Secondary | ICD-10-CM | POA: Insufficient documentation

## 2019-02-02 DIAGNOSIS — R51 Headache: Secondary | ICD-10-CM | POA: Insufficient documentation

## 2019-02-02 DIAGNOSIS — M4716 Other spondylosis with myelopathy, lumbar region: Secondary | ICD-10-CM

## 2019-02-02 DIAGNOSIS — M47892 Other spondylosis, cervical region: Secondary | ICD-10-CM | POA: Diagnosis not present

## 2019-02-02 DIAGNOSIS — K222 Esophageal obstruction: Secondary | ICD-10-CM | POA: Diagnosis not present

## 2019-02-02 DIAGNOSIS — M542 Cervicalgia: Secondary | ICD-10-CM | POA: Diagnosis not present

## 2019-02-02 DIAGNOSIS — R002 Palpitations: Secondary | ICD-10-CM | POA: Insufficient documentation

## 2019-02-02 DIAGNOSIS — Z5181 Encounter for therapeutic drug level monitoring: Secondary | ICD-10-CM

## 2019-02-02 DIAGNOSIS — Z86010 Personal history of colonic polyps: Secondary | ICD-10-CM | POA: Insufficient documentation

## 2019-02-02 DIAGNOSIS — M5136 Other intervertebral disc degeneration, lumbar region: Secondary | ICD-10-CM | POA: Diagnosis not present

## 2019-02-02 DIAGNOSIS — Z760 Encounter for issue of repeat prescription: Secondary | ICD-10-CM | POA: Diagnosis not present

## 2019-02-02 DIAGNOSIS — F419 Anxiety disorder, unspecified: Secondary | ICD-10-CM | POA: Diagnosis not present

## 2019-02-02 DIAGNOSIS — M1711 Unilateral primary osteoarthritis, right knee: Secondary | ICD-10-CM | POA: Diagnosis not present

## 2019-02-02 DIAGNOSIS — K449 Diaphragmatic hernia without obstruction or gangrene: Secondary | ICD-10-CM | POA: Insufficient documentation

## 2019-02-02 DIAGNOSIS — K0889 Other specified disorders of teeth and supporting structures: Secondary | ICD-10-CM | POA: Diagnosis not present

## 2019-02-02 DIAGNOSIS — F329 Major depressive disorder, single episode, unspecified: Secondary | ICD-10-CM | POA: Insufficient documentation

## 2019-02-02 DIAGNOSIS — R55 Syncope and collapse: Secondary | ICD-10-CM | POA: Insufficient documentation

## 2019-02-02 DIAGNOSIS — M545 Low back pain: Secondary | ICD-10-CM | POA: Insufficient documentation

## 2019-02-02 DIAGNOSIS — K219 Gastro-esophageal reflux disease without esophagitis: Secondary | ICD-10-CM | POA: Insufficient documentation

## 2019-02-02 DIAGNOSIS — Z87891 Personal history of nicotine dependence: Secondary | ICD-10-CM | POA: Insufficient documentation

## 2019-02-02 DIAGNOSIS — K589 Irritable bowel syndrome without diarrhea: Secondary | ICD-10-CM | POA: Diagnosis not present

## 2019-02-02 DIAGNOSIS — G894 Chronic pain syndrome: Secondary | ICD-10-CM | POA: Diagnosis not present

## 2019-02-02 DIAGNOSIS — Z79891 Long term (current) use of opiate analgesic: Secondary | ICD-10-CM

## 2019-02-02 DIAGNOSIS — K648 Other hemorrhoids: Secondary | ICD-10-CM | POA: Diagnosis not present

## 2019-02-02 DIAGNOSIS — M2550 Pain in unspecified joint: Secondary | ICD-10-CM

## 2019-02-02 DIAGNOSIS — G8929 Other chronic pain: Secondary | ICD-10-CM | POA: Insufficient documentation

## 2019-02-02 DIAGNOSIS — R000 Tachycardia, unspecified: Secondary | ICD-10-CM | POA: Insufficient documentation

## 2019-02-02 MED ORDER — HYDROCODONE-ACETAMINOPHEN 7.5-325 MG PO TABS: 2.0000 | ORAL_TABLET | Freq: Three times a day (TID) | ORAL | 0 refills | Status: DC | PRN

## 2019-02-02 NOTE — Progress Notes (Signed)
Subjective:    Patient ID: [name], female    DOB: 1976-07-10, 43 y.o.   MRN: [id number]  [id number] [name] is a 43 y.o. female who returns for follow up appointment for chronic pain and medication refill. She states her pain is located in her mid- lower back pain and also reports generalized joint pain. She rates her pain 6. Her current exercise regime is walking, swimming  and performing stretching exercises.  Also reports yesterday she fell outside in her yard, she slipped on some water and landed on her buttocks. She was able to pick herself up. She didn't seek medical attention. Educated on falls prevention, she verbalizes understanding.   [name] Morphine equivalent is 45.00 MME.She is also prescribed Clonazepam by Dr. Collene Mares.We have discussed the black box warning of using opioids and benzodiazepines. I highlighted the dangers of using these drugs together and discussed the adverse events including respiratory suppression, overdose, cognitive impairment and importance of compliance with current regimen. We will continue to monitor and adjust as indicated.    Last UDS was Performed on 10/11/2018, it was consistent.   Pain Inventory Average Pain 5 Pain Right Now 6 My pain is burning and dull  In the last 24 hours, has pain interfered with the following? General activity 5 Relation with others 6 Enjoyment of life 6 What TIME of day is your pain at its worst? morning,night Sleep (in general) Fair  Pain is worse with: bending and standing Pain improves with: heat/ice and medication Relief from Meds: 5  Mobility ability to climb steps?  yes do you drive?  yes  Function disabled: date disabled 2008  Neuro/Psych depression anxiety  Prior Studies x-rays n/a  Physicians involved in your care n/a   Family History  Problem Relation Age of Onset  . Diabetes type II Mother   . Colon polyps Mother   . Diverticulitis Mother   . Diabetes Mother   . Heart  disease Mother   . Osteoporosis Father   . Diabetes type II Brother   . Uterine cancer Other   . Ovarian cancer Other   . COPD Paternal Grandfather   . Heart attack Maternal Grandmother    Social History   Socioeconomic History  . Marital status: Married    Spouse name: Not on file  . Number of children: 2  . Years of education: Not on file  . Highest education level: Not on file  Occupational History  . Occupation: Disability    Employer: UNEMPLOYED  Social Needs  . Financial resource strain: Not on file  . Food insecurity    Worry: Not on file    Inability: Not on file  . Transportation needs    Medical: Not on file    Non-medical: Not on file  Tobacco Use  . Smoking status: Former Smoker    Types: Cigarettes    Quit date: 06/30/2011    Years since quitting: 7.6  . Smokeless tobacco: Never Used  Substance and Sexual Activity  . Alcohol use: No    Alcohol/week: 0.0 standard drinks  . Drug use: No  . Sexual activity: Yes  Lifestyle  . Physical activity    Days per week: Not on file    Minutes per session: Not on file  . Stress: Not on file  Relationships  . Social Herbalist on phone: Not on file    Gets together: Not on file    Attends religious service: Not on  file    Active member of club or organization: Not on file    Attends meetings of clubs or organizations: Not on file    Relationship status: Not on file  Other Topics Concern  . Not on file  Social History Narrative  . Not on file   Past Surgical History:  Procedure Laterality Date  . CESAREAN SECTION    . Left elbow surgery    . PARTIAL HYSTERECTOMY    . TONSILLECTOMY     Past Medical History:  Diagnosis Date  . Anxiety   . Chronic pain    Dr. Letta Pate  . Degenerative disc disease    Spinal, small syrinx  . Depression   . Esophageal stricture    Status post dilatation  . Fibromyalgia   . GERD (gastroesophageal reflux disease)   . Hiatal hernia   . Internal hemorrhoids  without mention of complication   . Irritable bowel syndrome   . Palpitations    No documented arrhythmias  . Personal history of colonic polyps 10/04/2000   HYPERPLASTIC POLYP  . POTS (postural orthostatic tachycardia syndrome)   . Syncope    Negative tilt table test  . Syringomyelia and syringobulbia ([place])    There were no vitals taken for this visit.  Opioid Risk Score:   Fall Risk Score:  `1  Depression screen PHQ 2/9  Depression screen [facility] 2/9 01/03/2019 11/09/2018 04/25/2018 09/20/2017 07/22/2017 05/24/2017 05/26/2016  Decreased Interest 1 1 1 1 1 1 1   Down, Depressed, Hopeless 1 1 1 1 1 1 1   PHQ - 2 Score 2 2 2 2 2 2 2   Altered sleeping - - - - - - -  Tired, decreased energy - - - - - - -  Change in appetite - - - - - - -  Feeling bad or failure about yourself  - - - - - - -  Trouble concentrating - - - - - - -  Moving slowly or fidgety/restless - - - - - - -  Suicidal thoughts - - - - - - -  PHQ-9 Score - - - - - - -  Some recent data might be hidden      Review of Systems     Objective:   Physical Exam Vitals signs and nursing note reviewed.  Constitutional:      Appearance: Normal appearance.  Neck:     Musculoskeletal: Normal range of motion and neck supple.  Cardiovascular:     Rate and Rhythm: Normal rate and regular rhythm.     Pulses: Normal pulses.     Heart sounds: Normal heart sounds.  Pulmonary:     Effort: Pulmonary effort is normal.     Breath sounds: Normal breath sounds.  Musculoskeletal:     Comments: Normal Muscle Bulk and Muscle Testing Reveals:  Upper Extremities: Full ROM and Muscle Strength 5/5  Lumbar Paraspinal Tenderness: L-3-L-5 Lower Extremities: Full ROM and Muscle Strength 5/5 Arises from Table with ease Narrow Based Gait   Skin:    General: Skin is warm and dry.  Neurological:     Mental Status: She is alert and oriented to person, place, and time.  Psychiatric:        Mood and Affect: Mood normal.        Behavior: Behavior  normal.           Assessment & Plan:  1. Lumbar degenerative disc disease:02/02/2019. Continue with current treatment regimen:Continue:HYDROcodone 7.5/325 mg two tablets every  8 hours as needed #180. We will continue the opioid monitoring program, this consists of regular clinic visits, examinations, urine drug screen, pill counts as well as use of [place] Controlled Substance Reporting System.  2. Thoracic Spondylosis: Continuecurrent treatment withHEP and current medication regime. 02/02/2019. 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 Continue with exercise and heat therapy.02/02/2019. 4. Fibromyalgia:Continue with Heat and exerciseRegimen.ContinueGabapentin.02/02/2019 5.Depression and anxiety: PCP Following.02/02/2019 6.Orthostatic hypotension: Cardiology Following.02/02/2019 7. Polyarthralgia: Continueto monitor. 02/02/2019 8. Chronic Bilateral  Shoulder Pain:No complaints today.Continue current medication regime. Continue HEP as Tolerated.02/02/2019. 9. Muscle Spasm:ContinueRobaxin.02/02/2019. 10. Neuropathic Pain:Continue:Gabapentin. 02/02/2019 11. Right elbow Pain: No Complaints Today.Continue current medication regimen. Continue to monitor.02/02/2019 12. Right Knee with OA: No complaints today. Continue HEP as Tolerated. Continue current medication regimen. Continue to monitor.02/02/2019  15 minutes of face to face patient care time was spent during this visit. All questions were encouraged and answered.  F/U in 1 month

## 2019-02-16 ENCOUNTER — Encounter: Payer: Medicare Other | Admitting: Psychology

## 2019-02-26 IMAGING — MG MM CLIP PLACEMENT
2 series · 2 of 2 positions shown · non-contrast
Comparison: Previous exam(s).

CLINICAL DATA: Post ultrasound-guided core needle biopsy of the
right breast.

EXAM:
DIAGNOSTIC RIGHT MAMMOGRAM POST ULTRASOUND BIOPSY

[R ML]
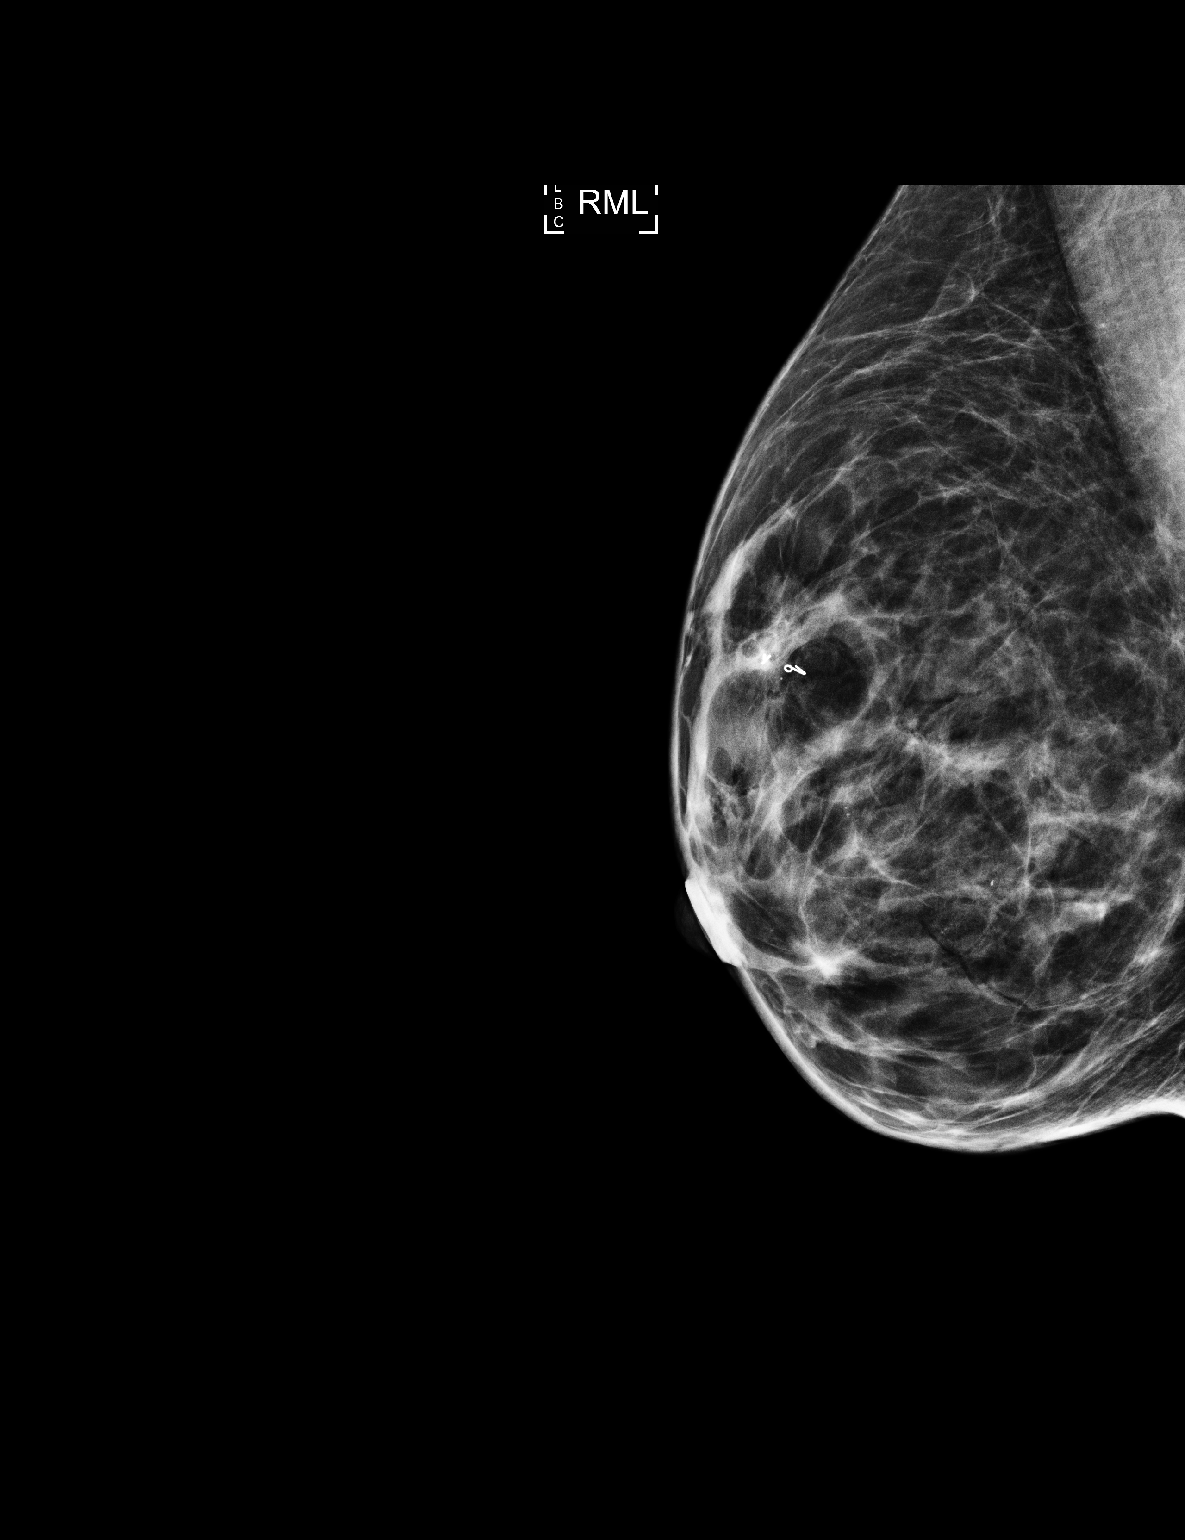

[R CC]
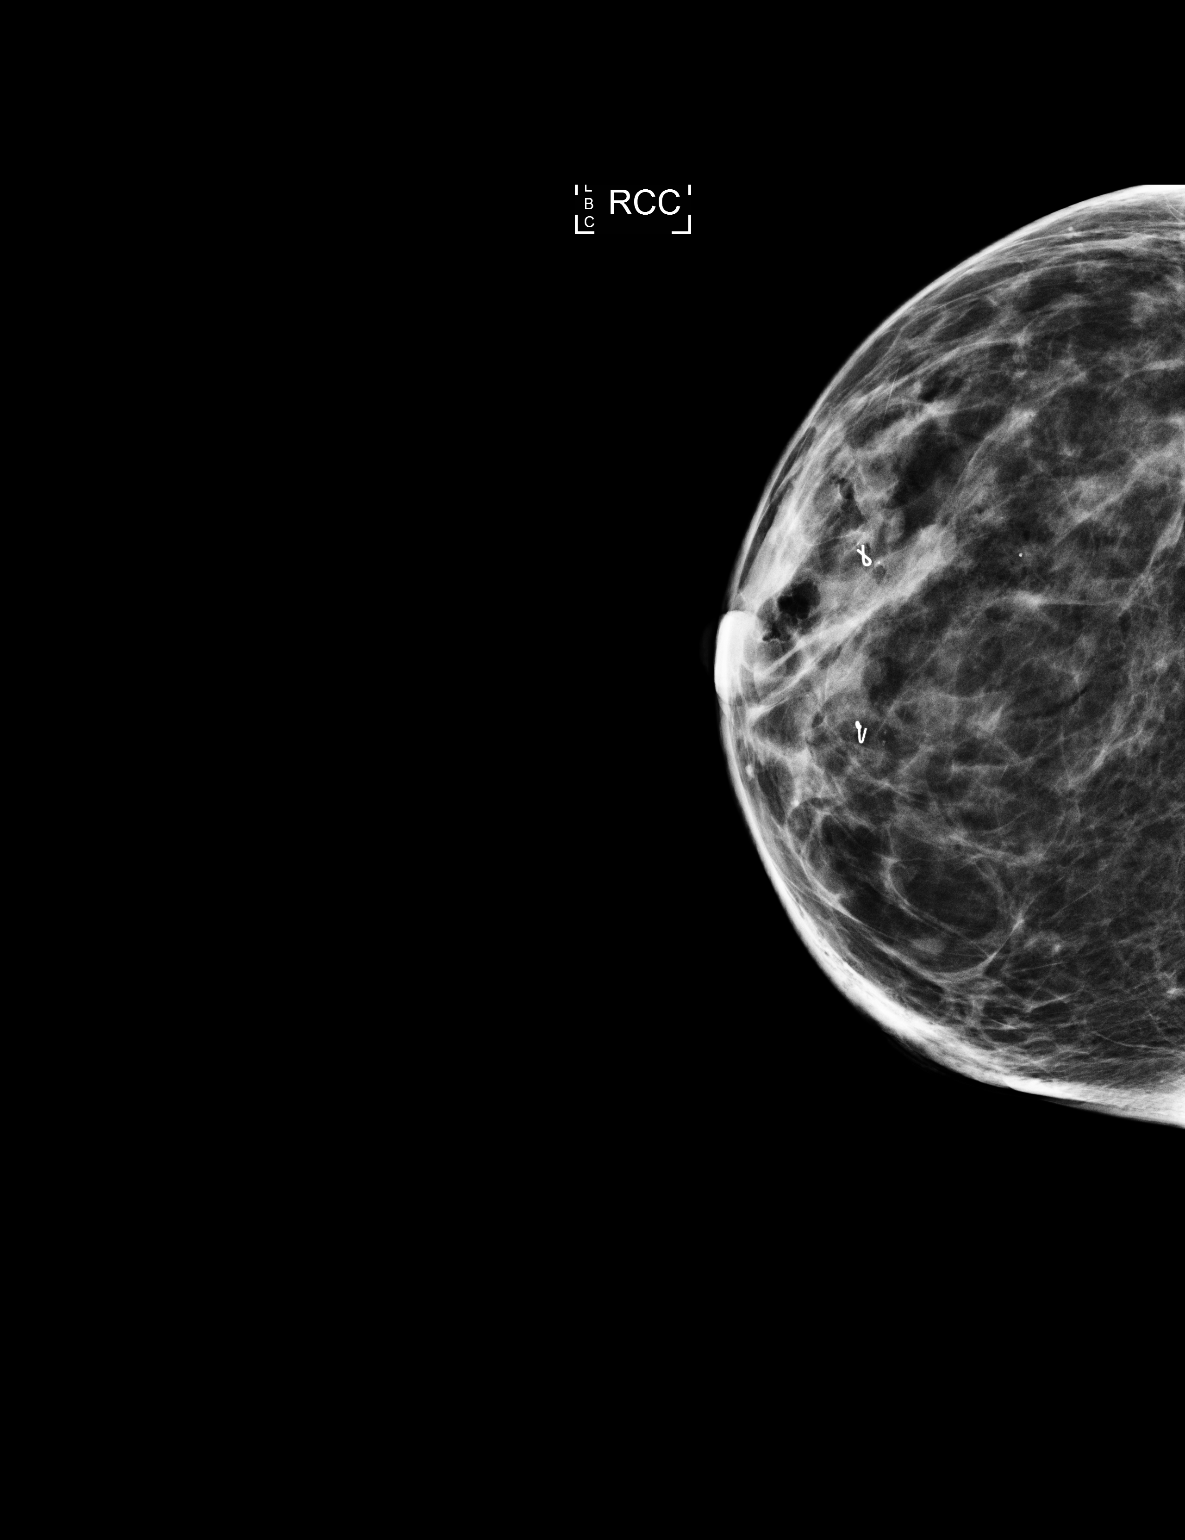

[2 of 2 positions shown; findings below may reference images not displayed]

FINDINGS: Mammographic images were obtained following ultra guided biopsy of
right breast 10 o'clock and 12 o'clock masses. Two-view mammography
demonstrates presence of ribbon shaped marker associated with the
right breast 10 o'clock mass, and wing shaped marker associated with
a right breast 12 o'clock mass. The markers are in expected
mammographic position.
IMPRESSION: Successful placement of 2 post biopsy tissue markers, post
ultrasound-guided core needle biopsy of right breast 10 and 12
o'clock masses.

Final Assessment: Post Procedure Mammograms for Marker Placement

## 2019-03-07 ENCOUNTER — Telehealth: Payer: Self-pay | Admitting: Registered Nurse

## 2019-03-07 ENCOUNTER — Encounter: Payer: Medicare Other | Admitting: Registered Nurse

## 2019-03-07 MED ORDER — HYDROCODONE-ACETAMINOPHEN 7.5-325 MG PO TABS: 2.0000 | ORAL_TABLET | Freq: Three times a day (TID) | ORAL | 0 refills | Status: DC | PRN

## 2019-03-07 NOTE — Telephone Encounter (Signed)
Pt broke down on way to her appointment today. I have rescheduled her. She will be out of medication after today.

## 2019-03-07 NOTE — Telephone Encounter (Signed)
PMP was reviewed: Hydrocodone e-scribed. Ms. [facility] appointment will be  re-scheduled, due to car problems. Judeen Hammans will call Ms. Shaheed and reschedule her appointment.

## 2019-03-10 ENCOUNTER — Ambulatory Visit: Payer: Medicare Other | Admitting: Registered Nurse

## 2019-03-31 ENCOUNTER — Encounter: Payer: Self-pay | Admitting: Registered Nurse

## 2019-03-31 ENCOUNTER — Other Ambulatory Visit: Payer: Self-pay

## 2019-03-31 ENCOUNTER — Encounter: Payer: Medicare Other | Attending: Physical Medicine & Rehabilitation | Admitting: Registered Nurse

## 2019-03-31 VITALS — BP 115/81 | HR 90 | Temp 97.6°F | Ht 67.0 in | Wt 148.4 lb

## 2019-03-31 DIAGNOSIS — M5412 Radiculopathy, cervical region: Secondary | ICD-10-CM

## 2019-03-31 DIAGNOSIS — K449 Diaphragmatic hernia without obstruction or gangrene: Secondary | ICD-10-CM | POA: Insufficient documentation

## 2019-03-31 DIAGNOSIS — M542 Cervicalgia: Secondary | ICD-10-CM | POA: Insufficient documentation

## 2019-03-31 DIAGNOSIS — Z86010 Personal history of colonic polyps: Secondary | ICD-10-CM | POA: Insufficient documentation

## 2019-03-31 DIAGNOSIS — K648 Other hemorrhoids: Secondary | ICD-10-CM | POA: Insufficient documentation

## 2019-03-31 DIAGNOSIS — Z760 Encounter for issue of repeat prescription: Secondary | ICD-10-CM | POA: Insufficient documentation

## 2019-03-31 DIAGNOSIS — Z87891 Personal history of nicotine dependence: Secondary | ICD-10-CM | POA: Insufficient documentation

## 2019-03-31 DIAGNOSIS — K222 Esophageal obstruction: Secondary | ICD-10-CM | POA: Insufficient documentation

## 2019-03-31 DIAGNOSIS — R55 Syncope and collapse: Secondary | ICD-10-CM | POA: Diagnosis not present

## 2019-03-31 DIAGNOSIS — R002 Palpitations: Secondary | ICD-10-CM | POA: Insufficient documentation

## 2019-03-31 DIAGNOSIS — Z79891 Long term (current) use of opiate analgesic: Secondary | ICD-10-CM

## 2019-03-31 DIAGNOSIS — G8929 Other chronic pain: Secondary | ICD-10-CM | POA: Diagnosis not present

## 2019-03-31 DIAGNOSIS — R000 Tachycardia, unspecified: Secondary | ICD-10-CM | POA: Diagnosis not present

## 2019-03-31 DIAGNOSIS — K219 Gastro-esophageal reflux disease without esophagitis: Secondary | ICD-10-CM | POA: Insufficient documentation

## 2019-03-31 DIAGNOSIS — M25511 Pain in right shoulder: Secondary | ICD-10-CM

## 2019-03-31 DIAGNOSIS — M5136 Other intervertebral disc degeneration, lumbar region: Secondary | ICD-10-CM | POA: Diagnosis not present

## 2019-03-31 DIAGNOSIS — F329 Major depressive disorder, single episode, unspecified: Secondary | ICD-10-CM | POA: Diagnosis not present

## 2019-03-31 DIAGNOSIS — M2550 Pain in unspecified joint: Secondary | ICD-10-CM | POA: Diagnosis not present

## 2019-03-31 DIAGNOSIS — K589 Irritable bowel syndrome without diarrhea: Secondary | ICD-10-CM | POA: Diagnosis not present

## 2019-03-31 DIAGNOSIS — I951 Orthostatic hypotension: Secondary | ICD-10-CM | POA: Diagnosis not present

## 2019-03-31 DIAGNOSIS — M797 Fibromyalgia: Secondary | ICD-10-CM | POA: Insufficient documentation

## 2019-03-31 DIAGNOSIS — M47892 Other spondylosis, cervical region: Secondary | ICD-10-CM | POA: Diagnosis not present

## 2019-03-31 DIAGNOSIS — M545 Low back pain: Secondary | ICD-10-CM | POA: Diagnosis not present

## 2019-03-31 DIAGNOSIS — Z599 Problem related to housing and economic circumstances, unspecified: Secondary | ICD-10-CM | POA: Diagnosis not present

## 2019-03-31 DIAGNOSIS — K0889 Other specified disorders of teeth and supporting structures: Secondary | ICD-10-CM | POA: Insufficient documentation

## 2019-03-31 DIAGNOSIS — M4716 Other spondylosis with myelopathy, lumbar region: Secondary | ICD-10-CM

## 2019-03-31 DIAGNOSIS — R51 Headache: Secondary | ICD-10-CM | POA: Insufficient documentation

## 2019-03-31 DIAGNOSIS — M47814 Spondylosis without myelopathy or radiculopathy, thoracic region: Secondary | ICD-10-CM | POA: Diagnosis not present

## 2019-03-31 DIAGNOSIS — G894 Chronic pain syndrome: Secondary | ICD-10-CM

## 2019-03-31 DIAGNOSIS — F419 Anxiety disorder, unspecified: Secondary | ICD-10-CM | POA: Insufficient documentation

## 2019-03-31 DIAGNOSIS — Z5181 Encounter for therapeutic drug level monitoring: Secondary | ICD-10-CM

## 2019-03-31 MED ORDER — HYDROCODONE-ACETAMINOPHEN 7.5-325 MG PO TABS: 2.0000 | ORAL_TABLET | Freq: Three times a day (TID) | ORAL | 0 refills | Status: DC | PRN

## 2019-03-31 NOTE — Progress Notes (Signed)
Subjective:    Patient ID: [name], female    DOB: 01-Jul-1976, 43 y.o.   MRN: [id number]  HPI: [name] is a 43 y.o. female who returns for follow up appointment for chronic pain and medication refill. She states her  pain is located in neck radiating into her bilateral shoulders and mid- lower back pain. She rates her  Pain 6-7. Her current exercise regime is walking and performing stretching exercises.  [name] Morphine equivalent is 45.00  MME. She is also prescribed Clonazepam  by Dr. Collene Mares. We have discussed the black box warning of using opioids and benzodiazepines. I highlighted the dangers of using these drugs together and discussed the adverse events including respiratory suppression, overdose, cognitive impairment and importance of compliance with current regimen. We will continue to monitor and adjust as indicated.   Last UDS was performed on 10/11/2018, it was consistent.   Pain Inventory Average Pain 6-7 Pain Right Now 6-7 My pain is sharp, burning, dull, stabbing, tingling and aching  In the last 24 hours, has pain interfered with the following? General activity 7 Relation with others 7 Enjoyment of life 7 What TIME of day is your pain at its worst? evening and night  Sleep (in general) Fair  Pain is worse with: walking, bending, sitting, inactivity, standing and some activites Pain improves with: rest, heat/ice, therapy/exercise, medication and TENS Relief from Meds: 5  Mobility walk without assistance how many minutes can you walk? 15-20 ability to climb steps?  yes do you drive?  yes Do you have any goals in this area?  yes  Function I need assistance with the following:  meal prep, household duties and shopping Do you have any goals in this area?  yes  Neuro/Psych weakness numbness tingling spasms  Prior Studies Any changes since last visit?  no  Physicians involved in your care Any changes since last visit?  no   Family History   Problem Relation Age of Onset  . Diabetes type II Mother   . Colon polyps Mother   . Diverticulitis Mother   . Diabetes Mother   . Heart disease Mother   . Osteoporosis Father   . Diabetes type II Brother   . Uterine cancer Other   . Ovarian cancer Other   . COPD Paternal Grandfather   . Heart attack Maternal Grandmother    Social History   Socioeconomic History  . Marital status: Married    Spouse name: Not on file  . Number of children: 2  . Years of education: Not on file  . Highest education level: Not on file  Occupational History  . Occupation: Disability    Employer: UNEMPLOYED  Social Needs  . Financial resource strain: Not on file  . Food insecurity    Worry: Not on file    Inability: Not on file  . Transportation needs    Medical: Not on file    Non-medical: Not on file  Tobacco Use  . Smoking status: Former Smoker    Types: Cigarettes    Quit date: 06/30/2011    Years since quitting: 7.7  . Smokeless tobacco: Never Used  Substance and Sexual Activity  . Alcohol use: No    Alcohol/week: 0.0 standard drinks  . Drug use: No  . Sexual activity: Yes  Lifestyle  . Physical activity    Days per week: Not on file    Minutes per session: Not on file  . Stress: Not on file  Relationships  . Social Herbalist on phone: Not on file    Gets together: Not on file    Attends religious service: Not on file    Active member of club or organization: Not on file    Attends meetings of clubs or organizations: Not on file    Relationship status: Not on file  Other Topics Concern  . Not on file  Social History Narrative  . Not on file   Past Surgical History:  Procedure Laterality Date  . CESAREAN SECTION    . Left elbow surgery    . PARTIAL HYSTERECTOMY    . TONSILLECTOMY     Past Medical History:  Diagnosis Date  . Anxiety   . Chronic pain    Dr. Letta Pate  . Degenerative disc disease    Spinal, small syrinx  . Depression   . Esophageal  stricture    Status post dilatation  . Fibromyalgia   . GERD (gastroesophageal reflux disease)   . Hiatal hernia   . Internal hemorrhoids without mention of complication   . Irritable bowel syndrome   . Palpitations    No documented arrhythmias  . Personal history of colonic polyps 10/04/2000   HYPERPLASTIC POLYP  . POTS (postural orthostatic tachycardia syndrome)   . Syncope    Negative tilt table test  . Syringomyelia and syringobulbia (HCC)    BP 115/81   Pulse 90   Temp 97.6 F (36.4 C)   Ht 5' 7"  (1.702 m)   Wt 148 lb 6.4 oz (67.3 kg)   SpO2 98%   BMI 23.24 kg/m   Opioid Risk Score:   Fall Risk Score:  `1  Depression screen PHQ 2/9  Depression screen [facility] 2/9 01/03/2019 11/09/2018 04/25/2018 09/20/2017 07/22/2017 05/24/2017 05/26/2016  Decreased Interest 1 1 1 1 1 1 1   Down, Depressed, Hopeless 1 1 1 1 1 1 1   PHQ - 2 Score 2 2 2 2 2 2 2   Altered sleeping - - - - - - -  Tired, decreased energy - - - - - - -  Change in appetite - - - - - - -  Feeling bad or failure about yourself  - - - - - - -  Trouble concentrating - - - - - - -  Moving slowly or fidgety/restless - - - - - - -  Suicidal thoughts - - - - - - -  PHQ-9 Score - - - - - - -  Some recent data might be hidden    Review of Systems  Constitutional: Negative.   HENT: Negative.   Eyes: Negative.   Respiratory: Negative.   Gastrointestinal: Negative.   Endocrine: Negative.   Genitourinary: Negative.   Musculoskeletal: Positive for back pain.  Skin: Negative.   Allergic/Immunologic: Negative.   Neurological: Positive for weakness and numbness.  Psychiatric/Behavioral: Negative.   All other systems reviewed and are negative.      Objective:   Physical Exam Vitals signs and nursing note reviewed.  Constitutional:      Appearance: Normal appearance.  Neck:     Musculoskeletal: Normal range of motion and neck supple.     Comments: Cervical Paraspinal Tenderness: C-5-C-6 Cardiovascular:     Rate and  Rhythm: Normal rate and regular rhythm.     Pulses: Normal pulses.     Heart sounds: Normal heart sounds.  Pulmonary:     Effort: Pulmonary effort is normal.     Breath sounds: Normal  breath sounds.  Musculoskeletal:     Comments: Normal Muscle Bulk and Muscle Testing Reveals:  Upper Extremities: Full ROM and Muscle Strength 5/5 Bilateral AC Joint Tenderness  Thoracic Paraspinal Tenderness: T-7-T-9  Lumbar Hypersensitivity Lower Extremities: Full ROM and Muscle Strength 5/5 Arises from Table with Ease Narrow Based Gait   Skin:    General: Skin is warm and dry.  Neurological:     Mental Status: She is alert and oriented to person, place, and time.  Psychiatric:        Mood and Affect: Mood normal.        Behavior: Behavior normal.           Assessment & Plan:  1. Lumbar degenerative disc disease:03/31/2019. Continue with current treatment regimen:Continue:HYDROcodone 7.5/325 mg two tablets every 8 hours as needed #180. We will continue the opioid monitoring program, this consists of regular clinic visits, examinations, urine drug screen, pill counts as well as use of [place] Controlled Substance Reporting System.  2. Thoracic Spondylosis: Continuecurrent treatment withHEP and current medication regime. 03/31/2019. 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 Continue with exercise and heat therapy.03/31/2019. 4. Fibromyalgia:Continue with Heat and  exerciseRegimen.ContinueGabapentin.03/31/2019 5.Depression and anxiety: PCP Following.03/31/2019 6.Orthostatic hypotension: Cardiology Following.03/31/2019 7. Polyarthralgia: Continueto monitor. 02/02/2019 8. ChronicBilateralShoulder Pain:Continue current medication regime. Continue HEP as Tolerated.03/31/2019. 9. Muscle Spasm:ContinueRobaxin.03/31/2019. 10.  Neuropathic Pain:Continue:Gabapentin. 03/31/2019 11. Right elbow Pain:No Complaints Today.Continue current medication regimen. Continue to monitor.03/31/2019 12.RightKnee with OA:No complaints today. Continue HEP as Tolerated.Continue current medication regimen. Continue to monitor.03/31/2019  15 minutes of face to face patient care time was spent during this visit. All questions were encouraged and answered.  F/U in 1 month

## 2019-04-06 DIAGNOSIS — I10 Essential (primary) hypertension: Secondary | ICD-10-CM | POA: Diagnosis not present

## 2019-04-06 DIAGNOSIS — G959 Disease of spinal cord, unspecified: Secondary | ICD-10-CM | POA: Diagnosis not present

## 2019-04-21 DIAGNOSIS — M542 Cervicalgia: Secondary | ICD-10-CM | POA: Diagnosis not present

## 2019-04-28 ENCOUNTER — Other Ambulatory Visit: Payer: Self-pay

## 2019-04-28 ENCOUNTER — Encounter: Payer: Self-pay | Admitting: Registered Nurse

## 2019-04-28 ENCOUNTER — Other Ambulatory Visit (HOSPITAL_COMMUNITY): Payer: Self-pay | Admitting: Neurosurgery

## 2019-04-28 ENCOUNTER — Other Ambulatory Visit: Payer: Self-pay | Admitting: Neurosurgery

## 2019-04-28 ENCOUNTER — Encounter: Payer: Medicare Other | Attending: Physical Medicine & Rehabilitation | Admitting: Registered Nurse

## 2019-04-28 VITALS — BP 108/82 | HR 99 | Temp 98.0°F | Resp 14 | Ht 67.0 in | Wt 146.0 lb

## 2019-04-28 DIAGNOSIS — Z86010 Personal history of colonic polyps: Secondary | ICD-10-CM | POA: Diagnosis not present

## 2019-04-28 DIAGNOSIS — F419 Anxiety disorder, unspecified: Secondary | ICD-10-CM | POA: Insufficient documentation

## 2019-04-28 DIAGNOSIS — Z599 Problem related to housing and economic circumstances, unspecified: Secondary | ICD-10-CM | POA: Insufficient documentation

## 2019-04-28 DIAGNOSIS — K449 Diaphragmatic hernia without obstruction or gangrene: Secondary | ICD-10-CM | POA: Insufficient documentation

## 2019-04-28 DIAGNOSIS — R51 Headache: Secondary | ICD-10-CM | POA: Insufficient documentation

## 2019-04-28 DIAGNOSIS — M2550 Pain in unspecified joint: Secondary | ICD-10-CM

## 2019-04-28 DIAGNOSIS — Z760 Encounter for issue of repeat prescription: Secondary | ICD-10-CM | POA: Diagnosis not present

## 2019-04-28 DIAGNOSIS — R000 Tachycardia, unspecified: Secondary | ICD-10-CM | POA: Diagnosis not present

## 2019-04-28 DIAGNOSIS — K219 Gastro-esophageal reflux disease without esophagitis: Secondary | ICD-10-CM | POA: Diagnosis not present

## 2019-04-28 DIAGNOSIS — M4716 Other spondylosis with myelopathy, lumbar region: Secondary | ICD-10-CM

## 2019-04-28 DIAGNOSIS — R002 Palpitations: Secondary | ICD-10-CM | POA: Diagnosis not present

## 2019-04-28 DIAGNOSIS — M5412 Radiculopathy, cervical region: Secondary | ICD-10-CM | POA: Diagnosis not present

## 2019-04-28 DIAGNOSIS — M542 Cervicalgia: Secondary | ICD-10-CM | POA: Diagnosis not present

## 2019-04-28 DIAGNOSIS — K648 Other hemorrhoids: Secondary | ICD-10-CM | POA: Insufficient documentation

## 2019-04-28 DIAGNOSIS — K0889 Other specified disorders of teeth and supporting structures: Secondary | ICD-10-CM | POA: Insufficient documentation

## 2019-04-28 DIAGNOSIS — I951 Orthostatic hypotension: Secondary | ICD-10-CM | POA: Diagnosis not present

## 2019-04-28 DIAGNOSIS — R55 Syncope and collapse: Secondary | ICD-10-CM | POA: Diagnosis not present

## 2019-04-28 DIAGNOSIS — M797 Fibromyalgia: Secondary | ICD-10-CM | POA: Diagnosis not present

## 2019-04-28 DIAGNOSIS — Z87891 Personal history of nicotine dependence: Secondary | ICD-10-CM | POA: Diagnosis not present

## 2019-04-28 DIAGNOSIS — M47892 Other spondylosis, cervical region: Secondary | ICD-10-CM | POA: Diagnosis not present

## 2019-04-28 DIAGNOSIS — K589 Irritable bowel syndrome without diarrhea: Secondary | ICD-10-CM | POA: Diagnosis not present

## 2019-04-28 DIAGNOSIS — F329 Major depressive disorder, single episode, unspecified: Secondary | ICD-10-CM | POA: Insufficient documentation

## 2019-04-28 DIAGNOSIS — M5416 Radiculopathy, lumbar region: Secondary | ICD-10-CM | POA: Diagnosis not present

## 2019-04-28 DIAGNOSIS — Z79891 Long term (current) use of opiate analgesic: Secondary | ICD-10-CM

## 2019-04-28 DIAGNOSIS — G8929 Other chronic pain: Secondary | ICD-10-CM | POA: Diagnosis not present

## 2019-04-28 DIAGNOSIS — M47814 Spondylosis without myelopathy or radiculopathy, thoracic region: Secondary | ICD-10-CM

## 2019-04-28 DIAGNOSIS — M170 Bilateral primary osteoarthritis of knee: Secondary | ICD-10-CM

## 2019-04-28 DIAGNOSIS — M5136 Other intervertebral disc degeneration, lumbar region: Secondary | ICD-10-CM | POA: Diagnosis not present

## 2019-04-28 DIAGNOSIS — M545 Low back pain: Secondary | ICD-10-CM | POA: Diagnosis not present

## 2019-04-28 DIAGNOSIS — K222 Esophageal obstruction: Secondary | ICD-10-CM | POA: Diagnosis not present

## 2019-04-28 DIAGNOSIS — Z5181 Encounter for therapeutic drug level monitoring: Secondary | ICD-10-CM

## 2019-04-28 DIAGNOSIS — G894 Chronic pain syndrome: Secondary | ICD-10-CM

## 2019-04-28 MED ORDER — HYDROCODONE-ACETAMINOPHEN 7.5-325 MG PO TABS: 2.0000 | ORAL_TABLET | Freq: Three times a day (TID) | ORAL | 0 refills | Status: DC | PRN

## 2019-04-28 NOTE — Progress Notes (Signed)
Subjective:    Patient ID: [name], female    DOB: 1976/06/16, 43 y.o.   MRN: [id number]  HPI: [name] is a 43 y.o. female who returns for follow up appointment for chronic pain and medication refill. She states her pain is located in her neck radiating into her bilateral shoulder, mid- lower back pain radiating into her right lower extremity occasionally. She rates her pain 6. Her current exercise regime is walking and performing stretching exercises.  [name] states she is seeing Dr. Arnoldo Morale for her Cervical Radiculitis and she is being scheduled for MRI's.    [name] Morphine equivalent is 45.00 MME.  Last UDS was Performed on 10/11/2018, it was consistent.    Pain Inventory Average Pain 7 Pain Right Now 6 My pain is intermittent, sharp, burning, dull, stabbing, tingling and aching  In the last 24 hours, has pain interfered with the following? General activity 7 Relation with others 7 Enjoyment of life 7 What TIME of day is your pain at its worst? morning, evening, night  Sleep (in general) Fair  Pain is worse with: walking, bending, sitting, inactivity, standing and some activites Pain improves with: rest, heat/ice, medication and TENS Relief from Meds: 5  Mobility walk without assistance how many minutes can you walk? 25 ability to climb steps?  yes do you drive?  yes  Function disabled: date disabled . I need assistance with the following:  meal prep, household duties and shopping  Neuro/Psych weakness numbness tremor tingling spasms dizziness confusion anxiety  Prior Studies Any changes since last visit?  no  Physicians involved in your care Any changes since last visit?  no   Family History  Problem Relation Age of Onset  . Diabetes type II Mother   . Colon polyps Mother   . Diverticulitis Mother   . Diabetes Mother   . Heart disease Mother   . Osteoporosis Father   . Diabetes type II Brother   . Uterine cancer Other   .  Ovarian cancer Other   . COPD Paternal Grandfather   . Heart attack Maternal Grandmother    Social History   Socioeconomic History  . Marital status: Married    Spouse name: Not on file  . Number of children: 2  . Years of education: Not on file  . Highest education level: Not on file  Occupational History  . Occupation: Disability    Employer: UNEMPLOYED  Social Needs  . Financial resource strain: Not on file  . Food insecurity    Worry: Not on file    Inability: Not on file  . Transportation needs    Medical: Not on file    Non-medical: Not on file  Tobacco Use  . Smoking status: Former Smoker    Types: Cigarettes    Quit date: 06/30/2011    Years since quitting: 7.8  . Smokeless tobacco: Never Used  Substance and Sexual Activity  . Alcohol use: No    Alcohol/week: 0.0 standard drinks  . Drug use: No  . Sexual activity: Yes  Lifestyle  . Physical activity    Days per week: Not on file    Minutes per session: Not on file  . Stress: Not on file  Relationships  . Social Herbalist on phone: Not on file    Gets together: Not on file    Attends religious service: Not on file    Active member of club or organization: Not on  file    Attends meetings of clubs or organizations: Not on file    Relationship status: Not on file  Other Topics Concern  . Not on file  Social History Narrative  . Not on file   Past Surgical History:  Procedure Laterality Date  . CESAREAN SECTION    . Left elbow surgery    . PARTIAL HYSTERECTOMY    . TONSILLECTOMY     Past Medical History:  Diagnosis Date  . Anxiety   . Chronic pain    Dr. Letta Pate  . Degenerative disc disease    Spinal, small syrinx  . Depression   . Esophageal stricture    Status post dilatation  . Fibromyalgia   . GERD (gastroesophageal reflux disease)   . Hiatal hernia   . Internal hemorrhoids without mention of complication   . Irritable bowel syndrome   . Palpitations    No documented  arrhythmias  . Personal history of colonic polyps 10/04/2000   HYPERPLASTIC POLYP  . POTS (postural orthostatic tachycardia syndrome)   . Syncope    Negative tilt table test  . Syringomyelia and syringobulbia (HCC)    Temp 98 F (36.7 C)   Ht 5' 7"  (1.702 m)   Wt 146 lb (66.2 kg)   BMI 22.87 kg/m   Opioid Risk Score:   Fall Risk Score:  `1  Depression screen PHQ 2/9  Depression screen [facility] 2/9 01/03/2019 11/09/2018 04/25/2018 09/20/2017 07/22/2017 05/24/2017 05/26/2016  Decreased Interest 1 1 1 1 1 1 1   Down, Depressed, Hopeless 1 1 1 1 1 1 1   PHQ - 2 Score 2 2 2 2 2 2 2   Altered sleeping - - - - - - -  Tired, decreased energy - - - - - - -  Change in appetite - - - - - - -  Feeling bad or failure about yourself  - - - - - - -  Trouble concentrating - - - - - - -  Moving slowly or fidgety/restless - - - - - - -  Suicidal thoughts - - - - - - -  PHQ-9 Score - - - - - - -  Some recent data might be hidden    Review of Systems  Constitutional: Negative.   HENT: Negative.   Eyes: Negative.   Respiratory: Negative.   Cardiovascular: Negative.   Gastrointestinal: Negative.   Endocrine: Negative.   Genitourinary: Negative.   Musculoskeletal: Positive for arthralgias, back pain, neck pain and neck stiffness.  Skin: Negative.   Allergic/Immunologic: Negative.   Neurological: Positive for tremors, weakness and numbness.       Tingling  Hematological: Negative.   Psychiatric/Behavioral: Positive for confusion. The patient is nervous/anxious.        Objective:   Physical Exam Vitals signs and nursing note reviewed.  Constitutional:      Appearance: Normal appearance.  Neck:     Musculoskeletal: Normal range of motion and neck supple.  Cardiovascular:     Rate and Rhythm: Normal rate and regular rhythm.     Pulses: Normal pulses.     Heart sounds: Normal heart sounds.  Pulmonary:     Effort: Pulmonary effort is normal.     Breath sounds: Normal breath sounds.   Musculoskeletal:     Comments: Normal Muscle Bulk and Muscle Testing Reveals:  Upper Extremities: Full ROM and Muscle Strength 5/5 Bilateral AC Joint Tenderness  Thoracic Paraspinal Tenderness: T-7T-9 Lower Extremities: Full ROM and Muscle Strength 5/5 Arises  from Table with ease Narrow Based  Gait   Skin:    General: Skin is warm and dry.  Neurological:     Mental Status: She is alert and oriented to person, place, and time.  Psychiatric:        Mood and Affect: Mood normal.        Behavior: Behavior normal.           Assessment & Plan:  1. Lumbar degenerative disc disease:04/28/2019. Continue with current treatment regimen:Continue:HYDROcodone 7.5/325 mg two tablets every 8 hours as needed #180. We will continue the opioid monitoring program, this consists of regular clinic visits, examinations, urine drug screen, pill counts as well as use of [place] Controlled Substance Reporting System.  2. Thoracic Spondylosis: Continuecurrent treatment withHEP and current medication regime. 04/28/2019. 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 Continue with exercise and heat therapy.04/28/2019. 4. Fibromyalgia:Continue with Heat and  exerciseRegimen.ContinueGabapentin.04/28/2019 5.Depression and anxiety: PCP Following.04/28/2019 6.Orthostatic hypotension: Cardiology Following.04/28/2019 7. Polyarthralgia: Continueto monitor. 04/28/2019 8. ChronicBilateralShoulder Pain:Continue current medication regime. Continue HEP as Tolerated.04/28/2019. 9. Muscle Spasm:ContinueRobaxin.04/28/2019. 10. Neuropathic Pain:Continue:Gabapentin. 04/28/2019 11. Right elbow Pain:.Continue current medication regimen. Continue to monitor.04/28/2019 12.BilateralKnee with OA R>L:No complaints today.Continue HEP as  Tolerated.Continue current medication regimen. Continue to monitor.04/28/2019  107mnutes of face to face patient care time was spent during this visit. All questions were encouraged and answered.  F/U in 1 month

## 2019-05-04 ENCOUNTER — Ambulatory Visit (HOSPITAL_COMMUNITY)
Admission: RE | Admit: 2019-05-04 | Discharge: 2019-05-04 | Disposition: A | Discharge status: Home / Self Care | Payer: Medicare Other | Source: Ambulatory Visit | Attending: Neurosurgery | Admitting: Neurosurgery

## 2019-05-04 ENCOUNTER — Other Ambulatory Visit: Payer: Self-pay

## 2019-05-04 DIAGNOSIS — M542 Cervicalgia: Secondary | ICD-10-CM

## 2019-05-23 ENCOUNTER — Other Ambulatory Visit (HOSPITAL_COMMUNITY): Payer: Self-pay | Admitting: Internal Medicine

## 2019-05-23 ENCOUNTER — Other Ambulatory Visit: Payer: Self-pay | Admitting: Internal Medicine

## 2019-05-23 DIAGNOSIS — R221 Localized swelling, mass and lump, neck: Secondary | ICD-10-CM

## 2019-05-25 DIAGNOSIS — M542 Cervicalgia: Secondary | ICD-10-CM | POA: Diagnosis not present

## 2019-05-30 ENCOUNTER — Encounter: Payer: Self-pay | Admitting: Registered Nurse

## 2019-05-30 ENCOUNTER — Encounter: Payer: Medicare Other | Attending: Physical Medicine & Rehabilitation | Admitting: Registered Nurse

## 2019-05-30 ENCOUNTER — Other Ambulatory Visit: Payer: Self-pay

## 2019-05-30 VITALS — BP 104/73 | HR 97 | Temp 97.5°F | Ht 67.0 in | Wt 146.6 lb

## 2019-05-30 DIAGNOSIS — Z5181 Encounter for therapeutic drug level monitoring: Secondary | ICD-10-CM | POA: Diagnosis not present

## 2019-05-30 DIAGNOSIS — Z87891 Personal history of nicotine dependence: Secondary | ICD-10-CM | POA: Diagnosis not present

## 2019-05-30 DIAGNOSIS — M62838 Other muscle spasm: Secondary | ICD-10-CM | POA: Diagnosis not present

## 2019-05-30 DIAGNOSIS — M2550 Pain in unspecified joint: Secondary | ICD-10-CM

## 2019-05-30 DIAGNOSIS — F329 Major depressive disorder, single episode, unspecified: Secondary | ICD-10-CM | POA: Diagnosis not present

## 2019-05-30 DIAGNOSIS — G894 Chronic pain syndrome: Secondary | ICD-10-CM | POA: Diagnosis not present

## 2019-05-30 DIAGNOSIS — M5412 Radiculopathy, cervical region: Secondary | ICD-10-CM | POA: Diagnosis not present

## 2019-05-30 DIAGNOSIS — M4716 Other spondylosis with myelopathy, lumbar region: Secondary | ICD-10-CM | POA: Diagnosis not present

## 2019-05-30 DIAGNOSIS — M5136 Other intervertebral disc degeneration, lumbar region: Secondary | ICD-10-CM | POA: Diagnosis not present

## 2019-05-30 DIAGNOSIS — Z86010 Personal history of colonic polyps: Secondary | ICD-10-CM | POA: Diagnosis not present

## 2019-05-30 DIAGNOSIS — M542 Cervicalgia: Secondary | ICD-10-CM

## 2019-05-30 DIAGNOSIS — M4722 Other spondylosis with radiculopathy, cervical region: Secondary | ICD-10-CM | POA: Diagnosis not present

## 2019-05-30 DIAGNOSIS — I951 Orthostatic hypotension: Secondary | ICD-10-CM | POA: Diagnosis not present

## 2019-05-30 DIAGNOSIS — F419 Anxiety disorder, unspecified: Secondary | ICD-10-CM | POA: Insufficient documentation

## 2019-05-30 DIAGNOSIS — M25521 Pain in right elbow: Secondary | ICD-10-CM | POA: Diagnosis not present

## 2019-05-30 DIAGNOSIS — M25561 Pain in right knee: Secondary | ICD-10-CM

## 2019-05-30 DIAGNOSIS — M797 Fibromyalgia: Secondary | ICD-10-CM | POA: Diagnosis not present

## 2019-05-30 DIAGNOSIS — Z760 Encounter for issue of repeat prescription: Secondary | ICD-10-CM | POA: Diagnosis not present

## 2019-05-30 DIAGNOSIS — M25511 Pain in right shoulder: Secondary | ICD-10-CM | POA: Insufficient documentation

## 2019-05-30 DIAGNOSIS — M47814 Spondylosis without myelopathy or radiculopathy, thoracic region: Secondary | ICD-10-CM | POA: Insufficient documentation

## 2019-05-30 DIAGNOSIS — M25512 Pain in left shoulder: Secondary | ICD-10-CM | POA: Insufficient documentation

## 2019-05-30 DIAGNOSIS — G8929 Other chronic pain: Secondary | ICD-10-CM

## 2019-05-30 DIAGNOSIS — Z79891 Long term (current) use of opiate analgesic: Secondary | ICD-10-CM

## 2019-05-30 DIAGNOSIS — K219 Gastro-esophageal reflux disease without esophagitis: Secondary | ICD-10-CM | POA: Diagnosis not present

## 2019-05-30 MED ORDER — HYDROCODONE-ACETAMINOPHEN 7.5-325 MG PO TABS: 2.0000 | ORAL_TABLET | Freq: Three times a day (TID) | ORAL | 0 refills | Status: DC | PRN

## 2019-05-30 NOTE — Progress Notes (Signed)
Subjective:    Patient ID: [name], female    DOB: October 11, 1975, 43 y.o.   MRN: [id number]  HPI: [name] is a 43 y.o. female who returns for follow up appointment for chronic pain and medication refill. She states her pain is located in her neck radiating into her right shoulder, mid-lower back and right knee. Also reports generalized joint pain. She rates her pain 6. Her current exercise regime is walking and performing stretching exercises.  Ms. Roach Morphine equivalent is 45.00  MME. She is also prescribed Clonazepam  by Dr. Gerarda Fraction .We have discussed the black box warning of using opioids and benzodiazepines. I highlighted the dangers of using these drugs together and discussed the adverse events including respiratory suppression, overdose, cognitive impairment and importance of compliance with current regimen. We will continue to monitor and adjust as indicated.    Pain Inventory Average Pain 7 Pain Right Now 6 My pain is intermittent, sharp, burning, dull, stabbing, tingling and aching  In the last 24 hours, has pain interfered with the following? General activity 6 Relation with others 7 Enjoyment of life 7 What TIME of day is your pain at its worst? morning evening night Sleep (in general) Fair  Pain is worse with: walking, bending, sitting, inactivity and standing Pain improves with: rest, heat/ice, medication and TENS Relief from Meds: 6  Mobility walk without assistance how many minutes can you walk? 25 ability to climb steps?  yes do you drive?  yes  Function disabled: date disabled . I need assistance with the following:  meal prep, household duties and shopping  Neuro/Psych weakness numbness spasms confusion depression anxiety  Prior Studies Any changes since last visit?  yes CT/MRI  Physicians involved in your care Any changes since last visit?  yes Neurosurgeon Dr. Arnoldo Morale   Family History  Problem Relation Age of Onset  . Diabetes  type II Mother   . Colon polyps Mother   . Diverticulitis Mother   . Diabetes Mother   . Heart disease Mother   . Osteoporosis Father   . Diabetes type II Brother   . Uterine cancer Other   . Ovarian cancer Other   . COPD Paternal Grandfather   . Heart attack Maternal Grandmother    Social History   Socioeconomic History  . Marital status: Married    Spouse name: Not on file  . Number of children: 2  . Years of education: Not on file  . Highest education level: Not on file  Occupational History  . Occupation: Disability    Employer: UNEMPLOYED  Social Needs  . Financial resource strain: Not on file  . Food insecurity    Worry: Not on file    Inability: Not on file  . Transportation needs    Medical: Not on file    Non-medical: Not on file  Tobacco Use  . Smoking status: Former Smoker    Types: Cigarettes    Quit date: 06/30/2011    Years since quitting: 7.9  . Smokeless tobacco: Never Used  Substance and Sexual Activity  . Alcohol use: No    Alcohol/week: 0.0 standard drinks  . Drug use: No  . Sexual activity: Yes  Lifestyle  . Physical activity    Days per week: Not on file    Minutes per session: Not on file  . Stress: Not on file  Relationships  . Social Herbalist on phone: Not on file    Gets  together: Not on file    Attends religious service: Not on file    Active member of club or organization: Not on file    Attends meetings of clubs or organizations: Not on file    Relationship status: Not on file  Other Topics Concern  . Not on file  Social History Narrative  . Not on file   Past Surgical History:  Procedure Laterality Date  . CESAREAN SECTION    . Left elbow surgery    . PARTIAL HYSTERECTOMY    . TONSILLECTOMY     Past Medical History:  Diagnosis Date  . Anxiety   . Chronic pain    Dr. Letta Pate  . Degenerative disc disease    Spinal, small syrinx  . Depression   . Esophageal stricture    Status post dilatation  .  Fibromyalgia   . GERD (gastroesophageal reflux disease)   . Hiatal hernia   . Internal hemorrhoids without mention of complication   . Irritable bowel syndrome   . Palpitations    No documented arrhythmias  . Personal history of colonic polyps 10/04/2000   HYPERPLASTIC POLYP  . POTS (postural orthostatic tachycardia syndrome)   . Syncope    Negative tilt table test  . Syringomyelia and syringobulbia (HCC)    BP 104/73   Pulse 97   Temp (!) 97.5 F (36.4 C)   Ht 5' 7"  (1.702 m)   Wt 146 lb 9.6 oz (66.5 kg)   SpO2 99%   BMI 22.96 kg/m    Opioid Risk Score:   Fall Risk Score:  `1  Depression screen PHQ 2/9  Depression screen [facility] 2/9 01/03/2019 11/09/2018 04/25/2018 09/20/2017 07/22/2017 05/24/2017 05/26/2016  Decreased Interest 1 1 1 1 1 1 1   Down, Depressed, Hopeless 1 1 1 1 1 1 1   PHQ - 2 Score 2 2 2 2 2 2 2   Altered sleeping - - - - - - -  Tired, decreased energy - - - - - - -  Change in appetite - - - - - - -  Feeling bad or failure about yourself  - - - - - - -  Trouble concentrating - - - - - - -  Moving slowly or fidgety/restless - - - - - - -  Suicidal thoughts - - - - - - -  PHQ-9 Score - - - - - - -  Some recent data might be hidden    Review of Systems  Neurological: Positive for weakness and numbness.       Tingling and spasms   Psychiatric/Behavioral: Positive for dysphoric mood. The patient is nervous/anxious.   All other systems reviewed and are negative.      Objective:   Physical Exam Vitals signs and nursing note reviewed.  Constitutional:      Appearance: Normal appearance.  Neck:     Musculoskeletal: Normal range of motion and neck supple.     Comments: Cervical Paraspinal Tenderness: C-5-C-6 Cardiovascular:     Rate and Rhythm: Normal rate and regular rhythm.     Pulses: Normal pulses.     Heart sounds: Normal heart sounds.  Pulmonary:     Effort: Pulmonary effort is normal.     Breath sounds: Normal breath sounds.  Musculoskeletal:      Comments: Normal Muscle Bulk and Muscle Testing Reveals:  Upper Extremities: Full ROM and Muscle Strength 5/5 Thoracic Paraspinal Tenderness: T-7-T-9  Lumbar Paraspinal Tenderness: L-3-L-5 Lower Extremities: Full ROM and Muscle Strength  5/5 Arises from Table with ease  Narrow Based Gait   Skin:    General: Skin is warm and dry.  Neurological:     Mental Status: She is alert and oriented to person, place, and time.           Assessment & Plan:  1. Lumbar degenerative disc disease:05/31/2019. Continue with current treatment regimen:Continue:HYDROcodone 7.5/325 mg two tablets every 8 hours as needed #180. We will continue the opioid monitoring program, this consists of regular clinic visits, examinations, urine drug screen, pill counts as well as use of [place] Controlled Substance Reporting System.  2. Thoracic Spondylosis: Continuecurrent treatment withHEP and current medication regime. 05/31/2019. 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 Continue with exercise and heat therapy.05/31/2019. 4. Fibromyalgia:Continue with Heat and exerciseRegimen.ContinueGabapentin.05/31/2019 5.Depression and anxiety: PCP Following.05/31/2019 6.Orthostatic hypotension: Cardiology Following.05/31/2019 7. Polyarthralgia: Continueto monitor. 05/31/2019 8. ChronicBilateralShoulder Pain:Continue current medication regime. Continue HEP as Tolerated.05/31/2019. 9. Muscle Spasm:ContinueRobaxin.05/31/2019. 10. Neuropathic Pain:Continue:Gabapentin. 05/31/2019 11. Right elbow Pain:.Continue current medication regimen. Continue to monitor.05/31/2019 12.BilateralKnee with OA R>L:Continue HEP as Tolerated.Continue current medication regimen. Continue to monitor.05/31/2019  25mnutes of face to face patient care time was spent  during this visit. All questions were encouraged and answered.  F/U in 1 month

## 2019-05-31 ENCOUNTER — Ambulatory Visit (HOSPITAL_COMMUNITY)
Admission: RE | Admit: 2019-05-31 | Discharge: 2019-05-31 | Disposition: A | Discharge status: Home / Self Care | Payer: Medicare Other | Source: Ambulatory Visit | Attending: Internal Medicine | Admitting: Internal Medicine

## 2019-05-31 DIAGNOSIS — R221 Localized swelling, mass and lump, neck: Secondary | ICD-10-CM | POA: Insufficient documentation

## 2019-06-02 LAB — TOXASSURE SELECT,+ANTIDEPR,UR

## 2019-06-03 DIAGNOSIS — I10 Essential (primary) hypertension: Secondary | ICD-10-CM | POA: Diagnosis not present

## 2019-06-05 ENCOUNTER — Telehealth: Payer: Self-pay | Admitting: *Deleted

## 2019-06-05 NOTE — Telephone Encounter (Signed)
Urine drug screen for this encounter is consistent for prescribed medication 

## 2019-06-27 ENCOUNTER — Other Ambulatory Visit: Payer: Self-pay

## 2019-06-27 ENCOUNTER — Encounter: Payer: Self-pay | Admitting: Registered Nurse

## 2019-06-27 ENCOUNTER — Encounter: Payer: Medicare Other | Attending: Physical Medicine & Rehabilitation | Admitting: Registered Nurse

## 2019-06-27 VITALS — Ht 67.0 in | Wt 146.0 lb

## 2019-06-27 DIAGNOSIS — Z87891 Personal history of nicotine dependence: Secondary | ICD-10-CM | POA: Diagnosis not present

## 2019-06-27 DIAGNOSIS — Z5181 Encounter for therapeutic drug level monitoring: Secondary | ICD-10-CM

## 2019-06-27 DIAGNOSIS — M25511 Pain in right shoulder: Secondary | ICD-10-CM | POA: Diagnosis not present

## 2019-06-27 DIAGNOSIS — M797 Fibromyalgia: Secondary | ICD-10-CM | POA: Insufficient documentation

## 2019-06-27 DIAGNOSIS — G5793 Unspecified mononeuropathy of bilateral lower limbs: Secondary | ICD-10-CM

## 2019-06-27 DIAGNOSIS — F329 Major depressive disorder, single episode, unspecified: Secondary | ICD-10-CM | POA: Diagnosis not present

## 2019-06-27 DIAGNOSIS — M62838 Other muscle spasm: Secondary | ICD-10-CM | POA: Insufficient documentation

## 2019-06-27 DIAGNOSIS — M25512 Pain in left shoulder: Secondary | ICD-10-CM | POA: Diagnosis not present

## 2019-06-27 DIAGNOSIS — M25521 Pain in right elbow: Secondary | ICD-10-CM | POA: Diagnosis not present

## 2019-06-27 DIAGNOSIS — M2550 Pain in unspecified joint: Secondary | ICD-10-CM

## 2019-06-27 DIAGNOSIS — G894 Chronic pain syndrome: Secondary | ICD-10-CM | POA: Insufficient documentation

## 2019-06-27 DIAGNOSIS — Z86010 Personal history of colonic polyps: Secondary | ICD-10-CM | POA: Diagnosis not present

## 2019-06-27 DIAGNOSIS — M47814 Spondylosis without myelopathy or radiculopathy, thoracic region: Secondary | ICD-10-CM

## 2019-06-27 DIAGNOSIS — M4722 Other spondylosis with radiculopathy, cervical region: Secondary | ICD-10-CM | POA: Insufficient documentation

## 2019-06-27 DIAGNOSIS — I951 Orthostatic hypotension: Secondary | ICD-10-CM | POA: Insufficient documentation

## 2019-06-27 DIAGNOSIS — M4716 Other spondylosis with myelopathy, lumbar region: Secondary | ICD-10-CM

## 2019-06-27 DIAGNOSIS — Z760 Encounter for issue of repeat prescription: Secondary | ICD-10-CM | POA: Diagnosis not present

## 2019-06-27 DIAGNOSIS — M5136 Other intervertebral disc degeneration, lumbar region: Secondary | ICD-10-CM | POA: Diagnosis not present

## 2019-06-27 DIAGNOSIS — F419 Anxiety disorder, unspecified: Secondary | ICD-10-CM | POA: Diagnosis not present

## 2019-06-27 DIAGNOSIS — M5412 Radiculopathy, cervical region: Secondary | ICD-10-CM | POA: Diagnosis not present

## 2019-06-27 DIAGNOSIS — M542 Cervicalgia: Secondary | ICD-10-CM

## 2019-06-27 DIAGNOSIS — K219 Gastro-esophageal reflux disease without esophagitis: Secondary | ICD-10-CM | POA: Diagnosis not present

## 2019-06-27 DIAGNOSIS — Z79891 Long term (current) use of opiate analgesic: Secondary | ICD-10-CM

## 2019-06-27 DIAGNOSIS — M792 Neuralgia and neuritis, unspecified: Secondary | ICD-10-CM

## 2019-06-27 MED ORDER — HYDROCODONE-ACETAMINOPHEN 7.5-325 MG PO TABS: 2.0000 | ORAL_TABLET | Freq: Three times a day (TID) | ORAL | 0 refills | Status: DC | PRN

## 2019-06-27 MED ORDER — METHOCARBAMOL 500 MG PO TABS: 500.0000 mg | ORAL_TABLET | Freq: Two times a day (BID) | ORAL | 2 refills | Status: DC | PRN

## 2019-06-27 MED ORDER — MELOXICAM 7.5 MG PO TABS: 7.5000 mg | ORAL_TABLET | Freq: Every day | ORAL | 3 refills | Status: DC

## 2019-06-27 NOTE — Progress Notes (Signed)
Subjective:    Patient ID: [name], female    DOB: March 04, 1976, 43 y.o.   MRN: [id number]  HPI: [name] is a 43 y.o. female her appointment was changed to a tele-health visit, due to exposure of COVID- 19, daughter in law.  Her appointment was changed to a virtual office visit to reduce the risk of exposure to the COVID-19 virus and to help [name] remain healthy and safe. The virtual visit will also provide continuity of care. [name] is in agreement with the above and she verbalizes understanding.   She states her pain is located in her neck radiating into her right shoulder, mid- lower back pain and bilateral feet pain with burning and tingling. Also reports bilateral bilateral hand pain. She rates her pain 9. Her current exercise regime is walking and performing stretching exercises.  [name] Morphine equivalent is 45.00  MME. She is also prescribed Clonazepam  by Dr.  Gerarda Fraction .We have discussed the black box warning of using opioids and benzodiazepines. I highlighted the dangers of using these drugs together and discussed the adverse events including respiratory suppression, overdose, cognitive impairment and importance of compliance with current regimen. We will continue to monitor and adjust as indicated.   Last UDS was Performed on 05/30/2019, it was consistent.    Kennon Rounds CMA asked the Health and History Question. This provider and Kennon Rounds verified we were speaking with the correct person using two identifiers.    Pain Inventory Average Pain 9 Pain Right Now 9 My pain is intermittent, sharp, burning, dull, stabbing and tingling  In the last 24 hours, has pain interfered with the following? General activity 6 Relation with others 7 Enjoyment of life 7 What TIME of day is your pain at its worst? morning evening night Sleep (in general) Fair  Pain is worse with: walking, bending, sitting, inactivity and standing Pain improves with: rest, heat/ice,  medication and TENS Relief from Meds: 6  Mobility walk without assistance how many minutes can you walk? 25 ability to climb steps?  yes do you drive?  yes  Function disabled: date disabled . I need assistance with the following:  meal prep, household duties and shopping  Neuro/Psych weakness numbness spasms confusion depression anxiety  Prior Studies Any changes since last visit?  yes  Physicians involved in your care Any changes since last visit?  yes   Family History  Problem Relation Age of Onset  . Diabetes type II Mother   . Colon polyps Mother   . Diverticulitis Mother   . Diabetes Mother   . Heart disease Mother   . Osteoporosis Father   . Diabetes type II Brother   . Uterine cancer Other   . Ovarian cancer Other   . COPD Paternal Grandfather   . Heart attack Maternal Grandmother    Social History   Socioeconomic History  . Marital status: Married    Spouse name: Not on file  . Number of children: 2  . Years of education: Not on file  . Highest education level: Not on file  Occupational History  . Occupation: Disability    Employer: UNEMPLOYED  Social Needs  . Financial resource strain: Not on file  . Food insecurity    Worry: Not on file    Inability: Not on file  . Transportation needs    Medical: Not on file    Non-medical: Not on file  Tobacco Use  . Smoking status: Former Smoker  Types: Cigarettes    Quit date: 06/30/2011    Years since quitting: 7.9  . Smokeless tobacco: Never Used  Substance and Sexual Activity  . Alcohol use: No    Alcohol/week: 0.0 standard drinks  . Drug use: No  . Sexual activity: Yes  Lifestyle  . Physical activity    Days per week: Not on file    Minutes per session: Not on file  . Stress: Not on file  Relationships  . Social Herbalist on phone: Not on file    Gets together: Not on file    Attends religious service: Not on file    Active member of club or organization: Not on file     Attends meetings of clubs or organizations: Not on file    Relationship status: Not on file  Other Topics Concern  . Not on file  Social History Narrative  . Not on file   Past Surgical History:  Procedure Laterality Date  . CESAREAN SECTION    . Left elbow surgery    . PARTIAL HYSTERECTOMY    . TONSILLECTOMY     Past Medical History:  Diagnosis Date  . Anxiety   . Chronic pain    Dr. Letta Pate  . Degenerative disc disease    Spinal, small syrinx  . Depression   . Esophageal stricture    Status post dilatation  . Fibromyalgia   . GERD (gastroesophageal reflux disease)   . Hiatal hernia   . Internal hemorrhoids without mention of complication   . Irritable bowel syndrome   . Palpitations    No documented arrhythmias  . Personal history of colonic polyps 10/04/2000   HYPERPLASTIC POLYP  . POTS (postural orthostatic tachycardia syndrome)   . Syncope    Negative tilt table test  . Syringomyelia and syringobulbia (HCC)    Ht 5' 7"  (1.702 m) Comment: pt reported webex  Wt 146 lb (66.2 kg) Comment: pt reported, webex  BMI 22.87 kg/m   Opioid Risk Score:   Fall Risk Score:  `1  Depression screen PHQ 2/9  Depression screen [facility] 2/9 01/03/2019 11/09/2018 04/25/2018 09/20/2017 07/22/2017 05/24/2017 05/26/2016  Decreased Interest 1 1 1 1 1 1 1   Down, Depressed, Hopeless 1 1 1 1 1 1 1   PHQ - 2 Score 2 2 2 2 2 2 2   Altered sleeping - - - - - - -  Tired, decreased energy - - - - - - -  Change in appetite - - - - - - -  Feeling bad or failure about yourself  - - - - - - -  Trouble concentrating - - - - - - -  Moving slowly or fidgety/restless - - - - - - -  Suicidal thoughts - - - - - - -  PHQ-9 Score - - - - - - -  Some recent data might be hidden    Review of Systems     Objective:   Physical Exam Vitals signs and nursing note reviewed.  Musculoskeletal:     Comments: No physical Exam: Virtual Visit           Assessment & Plan:  1. Lumbar degenerative disc  disease:06/27/2019. Continue with current treatment regimen:Continue:HYDROcodone 7.5/325 mg two tablets every 8 hours as needed #180. We will continue the opioid monitoring program, this consists of regular clinic visits, examinations, urine drug screen, pill counts as well as use of [place] Controlled Substance Reporting System.  2.  Thoracic Spondylosis: Continuecurrent treatment withHEP and current medication regime. 06/27/2019. 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 Continue with exercise and heat therapy.06/27/2019. 4. Fibromyalgia:Continue with Heat and exerciseRegimen.ContinueGabapentin.06/27/2019 5.Depression and anxiety: PCP Following.06/27/2019 6.Orthostatic hypotension: Cardiology Following.06/27/2019 7. Polyarthralgia: Continueto monitor. 06/27/2019 8. ChronicBilateralShoulder Pain:Continue current medication regime. Continue HEP as Tolerated.06/27/2019. 9. Muscle Spasm:ContinueRobaxin.06/27/2019. 10. Neuropathic Pain:Continue:Gabapentin. 06/27/2019 11. Right elbow Pain:No complaints today. Continue current medication regimen. Continue to monitor.07/01/2019 12.BilateralKnee with OAR>L:Continue HEP as Tolerated.Continue current medication regimen with Mobic. Continue to monitor.06/27/2019  73mnutes of face to face patient care time was spent during this visit. All questions were encouraged and answered.  F/U in 1 month

## 2019-08-01 ENCOUNTER — Encounter: Payer: Self-pay | Admitting: Registered Nurse

## 2019-08-01 ENCOUNTER — Other Ambulatory Visit: Payer: Self-pay

## 2019-08-01 ENCOUNTER — Encounter: Payer: Medicare Other | Attending: Physical Medicine & Rehabilitation | Admitting: Registered Nurse

## 2019-08-01 VITALS — BP 106/84 | HR 85 | Temp 97.5°F | Ht 67.0 in | Wt 150.2 lb

## 2019-08-01 DIAGNOSIS — M62838 Other muscle spasm: Secondary | ICD-10-CM | POA: Insufficient documentation

## 2019-08-01 DIAGNOSIS — M25511 Pain in right shoulder: Secondary | ICD-10-CM | POA: Insufficient documentation

## 2019-08-01 DIAGNOSIS — M4716 Other spondylosis with myelopathy, lumbar region: Secondary | ICD-10-CM

## 2019-08-01 DIAGNOSIS — M792 Neuralgia and neuritis, unspecified: Secondary | ICD-10-CM

## 2019-08-01 DIAGNOSIS — F419 Anxiety disorder, unspecified: Secondary | ICD-10-CM | POA: Insufficient documentation

## 2019-08-01 DIAGNOSIS — F329 Major depressive disorder, single episode, unspecified: Secondary | ICD-10-CM | POA: Insufficient documentation

## 2019-08-01 DIAGNOSIS — M542 Cervicalgia: Secondary | ICD-10-CM

## 2019-08-01 DIAGNOSIS — M25512 Pain in left shoulder: Secondary | ICD-10-CM | POA: Insufficient documentation

## 2019-08-01 DIAGNOSIS — M4722 Other spondylosis with radiculopathy, cervical region: Secondary | ICD-10-CM | POA: Diagnosis not present

## 2019-08-01 DIAGNOSIS — Z87891 Personal history of nicotine dependence: Secondary | ICD-10-CM | POA: Insufficient documentation

## 2019-08-01 DIAGNOSIS — Z760 Encounter for issue of repeat prescription: Secondary | ICD-10-CM | POA: Diagnosis not present

## 2019-08-01 DIAGNOSIS — Z5181 Encounter for therapeutic drug level monitoring: Secondary | ICD-10-CM

## 2019-08-01 DIAGNOSIS — M47814 Spondylosis without myelopathy or radiculopathy, thoracic region: Secondary | ICD-10-CM

## 2019-08-01 DIAGNOSIS — M5412 Radiculopathy, cervical region: Secondary | ICD-10-CM | POA: Diagnosis not present

## 2019-08-01 DIAGNOSIS — Z86010 Personal history of colonic polyps: Secondary | ICD-10-CM | POA: Diagnosis not present

## 2019-08-01 DIAGNOSIS — K219 Gastro-esophageal reflux disease without esophagitis: Secondary | ICD-10-CM | POA: Diagnosis not present

## 2019-08-01 DIAGNOSIS — M797 Fibromyalgia: Secondary | ICD-10-CM

## 2019-08-01 DIAGNOSIS — G894 Chronic pain syndrome: Secondary | ICD-10-CM | POA: Diagnosis not present

## 2019-08-01 DIAGNOSIS — I951 Orthostatic hypotension: Secondary | ICD-10-CM | POA: Diagnosis not present

## 2019-08-01 DIAGNOSIS — Z79891 Long term (current) use of opiate analgesic: Secondary | ICD-10-CM

## 2019-08-01 DIAGNOSIS — M5136 Other intervertebral disc degeneration, lumbar region: Secondary | ICD-10-CM | POA: Insufficient documentation

## 2019-08-01 DIAGNOSIS — M25521 Pain in right elbow: Secondary | ICD-10-CM | POA: Diagnosis not present

## 2019-08-01 DIAGNOSIS — M2550 Pain in unspecified joint: Secondary | ICD-10-CM

## 2019-08-01 MED ORDER — HYDROCODONE-ACETAMINOPHEN 7.5-325 MG PO TABS: 2.0000 | ORAL_TABLET | Freq: Three times a day (TID) | ORAL | 0 refills | Status: DC | PRN

## 2019-08-01 NOTE — Progress Notes (Signed)
Subjective:    Patient ID: [name], female    DOB: 08/19/1975, 43 y.o.   MRN: [id number]  HPI: [name] is a 43 y.o. female who returns for follow up appointment for chronic pain and medication refill. She states her pain is located in her neck radiating into her head ( occipital region), mid- lower back pain . Also reports bilateral hands with tingling and burning and generalized joint pain. She rates her pain 9. Her current exercise regime is walking and performing stretching exercises.  Ms. Spoto Morphine equivalent is 45.00 MME. She is also prescribed Clonazepam by Dr. Gerarda Fraction. We have discussed the black box warning of using opioids and benzodiazepines. I highlighted the dangers of using these drugs together and discussed the adverse events including respiratory suppression, overdose, cognitive impairment and importance of compliance with current regimen. We will continue to monitor and adjust as indicated.   Last UDS was Performed on 05/30/2019, it was consistent.   Pain Inventory Average Pain 9 Pain Right Now 9 My pain is intermittent, sharp, burning, dull, stabbing, tingling and aching  In the last 24 hours, has pain interfered with the following? General activity 7 Relation with others 7 Enjoyment of life 6 What TIME of day is your pain at its worst? morning evening and night Sleep (in general) Fair  Pain is worse with: walking, bending, sitting, inactivity, standing and some activites Pain improves with: rest, heat/ice, therapy/exercise, medication and TENS Relief from Meds: 7  Mobility walk without assistance ability to climb steps?  yes do you drive?  yes  Function disabled: date disabled . I need assistance with the following:  meal prep, household duties and shopping  Neuro/Psych weakness numbness tingling spasms  Prior Studies Any changes since last visit?  no  Physicians involved in your care Any changes since last visit?  no   Family  History  Problem Relation Age of Onset  . Diabetes type II Mother   . Colon polyps Mother   . Diverticulitis Mother   . Diabetes Mother   . Heart disease Mother   . Osteoporosis Father   . Diabetes type II Brother   . Uterine cancer Other   . Ovarian cancer Other   . COPD Paternal Grandfather   . Heart attack Maternal Grandmother    Social History   Socioeconomic History  . Marital status: Married    Spouse name: Not on file  . Number of children: 2  . Years of education: Not on file  . Highest education level: Not on file  Occupational History  . Occupation: Disability    Employer: UNEMPLOYED  Tobacco Use  . Smoking status: Former Smoker    Types: Cigarettes    Quit date: 06/30/2011    Years since quitting: 8.0  . Smokeless tobacco: Never Used  Substance and Sexual Activity  . Alcohol use: No    Alcohol/week: 0.0 standard drinks  . Drug use: No  . Sexual activity: Yes  Other Topics Concern  . Not on file  Social History Narrative  . Not on file   Social Determinants of Health   Financial Resource Strain:   . Difficulty of Paying Living Expenses: Not on file  Food Insecurity:   . Worried About Charity fundraiser in the Last Year: Not on file  . Ran Out of Food in the Last Year: Not on file  Transportation Needs:   . Lack of Transportation (Medical): Not on file  . Lack  of Transportation (Non-Medical): Not on file  Physical Activity:   . Days of Exercise per Week: Not on file  . Minutes of Exercise per Session: Not on file  Stress:   . Feeling of Stress : Not on file  Social Connections:   . Frequency of Communication with Friends and Family: Not on file  . Frequency of Social Gatherings with Friends and Family: Not on file  . Attends Religious Services: Not on file  . Active Member of Clubs or Organizations: Not on file  . Attends Archivist Meetings: Not on file  . Marital Status: Not on file   Past Surgical History:  Procedure Laterality  Date  . CESAREAN SECTION    . Left elbow surgery    . PARTIAL HYSTERECTOMY    . TONSILLECTOMY     Past Medical History:  Diagnosis Date  . Anxiety   . Chronic pain    Dr. Letta Pate  . Degenerative disc disease    Spinal, small syrinx  . Depression   . Esophageal stricture    Status post dilatation  . Fibromyalgia   . GERD (gastroesophageal reflux disease)   . Hiatal hernia   . Internal hemorrhoids without mention of complication   . Irritable bowel syndrome   . Palpitations    No documented arrhythmias  . Personal history of colonic polyps 10/04/2000   HYPERPLASTIC POLYP  . POTS (postural orthostatic tachycardia syndrome)   . Syncope    Negative tilt table test  . Syringomyelia and syringobulbia (HCC)    BP 106/84 (BP Location: Left Arm, Patient Position: Sitting, Cuff Size: Normal)   Pulse 85   Temp (!) 97.5 F (36.4 C)   Ht 5' 7"  (1.702 m)   Wt 150 lb 3.2 oz (68.1 kg)   SpO2 98%   BMI 23.52 kg/m   Opioid Risk Score:   Fall Risk Score:  `1  Depression screen PHQ 2/9  Depression screen [facility] 2/9 01/03/2019 11/09/2018 04/25/2018 09/20/2017 07/22/2017 05/24/2017 05/26/2016  Decreased Interest 1 1 1 1 1 1 1   Down, Depressed, Hopeless 1 1 1 1 1 1 1   PHQ - 2 Score 2 2 2 2 2 2 2   Altered sleeping - - - - - - -  Tired, decreased energy - - - - - - -  Change in appetite - - - - - - -  Feeling bad or failure about yourself  - - - - - - -  Trouble concentrating - - - - - - -  Moving slowly or fidgety/restless - - - - - - -  Suicidal thoughts - - - - - - -  PHQ-9 Score - - - - - - -  Some recent data might be hidden    Review of Systems  Constitutional: Negative.   HENT: Negative.   Eyes: Negative.   Respiratory: Negative.   Cardiovascular: Negative.   Gastrointestinal: Negative.   Endocrine: Negative.   Genitourinary: Negative.   Musculoskeletal:       Spasms  Skin: Negative.   Allergic/Immunologic: Negative.   Neurological: Positive for weakness and numbness.        Tingling  Hematological: Negative.   Psychiatric/Behavioral: Negative.   All other systems reviewed and are negative.      Objective:   Physical Exam Vitals and nursing note reviewed.  Constitutional:      Appearance: Normal appearance.  Cardiovascular:     Rate and Rhythm: Normal rate and regular rhythm.  Pulses: Normal pulses.     Heart sounds: Normal heart sounds.  Pulmonary:     Effort: Pulmonary effort is normal.     Breath sounds: Normal breath sounds.  Musculoskeletal:     Cervical back: Normal range of motion and neck supple.     Comments: Normal Muscle Bulk and Muscle Testing Reveals:  Upper Extremities: Full ROM and Muscle Strength 5/5 Thoracic Paraspinal Tenderness: T-10- T-12 Lumbar Paraspinal Tenderness: L-3- L-5 Lower Extremities: Full ROM and Muscle Strength 5/5 Arises from chair with ease Narrow Based Gait   Skin:    General: Skin is warm and dry.  Neurological:     Mental Status: She is alert and oriented to person, place, and time.  Psychiatric:        Mood and Affect: Mood normal.        Behavior: Behavior normal.           Assessment & Plan:  1. Lumbar degenerative disc disease:08/01/2019. Continue with current treatment regimen:Refilled:HYDROcodone 7.5/325 mg two tablets every 8 hours as needed #180. We will continue the opioid monitoring program, this consists of regular clinic visits, examinations, urine drug screen, pill counts as well as use of [place] Controlled Substance Reporting System.  2. Thoracic Spondylosis: Continuecurrent treatment withHEP and current medication regime.08/01/2019. 3 Cervicalgia/ Cervical Radiculitis/ Cervical spondylosis without evidence of myelopathy:ContinueGabapentin.Continue to Monitor.08/01/2019.Marland Kitchen Lyrica discontinued due to adverse effects. Continue with exercise and heat therapy.08/01/2019. 4. Fibromyalgia:Continue with Heat and  exerciseRegimen.ContinueGabapentin.08/01/2019 5.Depression and anxiety: PCP Following.08/01/2019 6.Orthostatic hypotension: Cardiology Following.08/01/2019 7. Polyarthralgia: Continueto monitor.08/01/2019 8. ChronicBilateralShoulder Pain:Continue current medication regime. Continue HEP as Tolerated.08/01/2019. 9. Muscle Spasm:ContinueRobaxin.08/01/2019. 10. Neuropathic Pain:Continue:Gabapentin.08/01/2019 11. Right elbow Pain:No complaints today. Continue current medication regimen. Continue to monitor.08/01/2019 12.BilateralKnee with OAR>L:Continue HEP as Tolerated.Continue current medication regimen with Mobic. Continue to monitor.08/01/2019  71mnutes of face to face patient care time was spent during this visit. All questions were encouraged and answered.  F/U in 1 month

## 2019-08-03 DIAGNOSIS — I10 Essential (primary) hypertension: Secondary | ICD-10-CM | POA: Diagnosis not present

## 2019-08-30 ENCOUNTER — Encounter: Payer: Self-pay | Admitting: Registered Nurse

## 2019-08-30 ENCOUNTER — Other Ambulatory Visit: Payer: Self-pay | Admitting: Registered Nurse

## 2019-08-30 ENCOUNTER — Other Ambulatory Visit: Payer: Self-pay

## 2019-08-30 ENCOUNTER — Encounter: Payer: Medicare Other | Attending: Physical Medicine & Rehabilitation | Admitting: Registered Nurse

## 2019-08-30 VITALS — BP 111/76 | HR 95 | Ht 67.0 in | Wt 146.0 lb

## 2019-08-30 DIAGNOSIS — M542 Cervicalgia: Secondary | ICD-10-CM | POA: Diagnosis not present

## 2019-08-30 DIAGNOSIS — M25521 Pain in right elbow: Secondary | ICD-10-CM | POA: Insufficient documentation

## 2019-08-30 DIAGNOSIS — M47814 Spondylosis without myelopathy or radiculopathy, thoracic region: Secondary | ICD-10-CM | POA: Insufficient documentation

## 2019-08-30 DIAGNOSIS — Z79891 Long term (current) use of opiate analgesic: Secondary | ICD-10-CM

## 2019-08-30 DIAGNOSIS — M25512 Pain in left shoulder: Secondary | ICD-10-CM | POA: Insufficient documentation

## 2019-08-30 DIAGNOSIS — M62838 Other muscle spasm: Secondary | ICD-10-CM | POA: Insufficient documentation

## 2019-08-30 DIAGNOSIS — Z5181 Encounter for therapeutic drug level monitoring: Secondary | ICD-10-CM

## 2019-08-30 DIAGNOSIS — G894 Chronic pain syndrome: Secondary | ICD-10-CM | POA: Diagnosis not present

## 2019-08-30 DIAGNOSIS — Z86010 Personal history of colonic polyps: Secondary | ICD-10-CM | POA: Insufficient documentation

## 2019-08-30 DIAGNOSIS — K219 Gastro-esophageal reflux disease without esophagitis: Secondary | ICD-10-CM | POA: Insufficient documentation

## 2019-08-30 DIAGNOSIS — M4716 Other spondylosis with myelopathy, lumbar region: Secondary | ICD-10-CM

## 2019-08-30 DIAGNOSIS — I951 Orthostatic hypotension: Secondary | ICD-10-CM | POA: Insufficient documentation

## 2019-08-30 DIAGNOSIS — Z87891 Personal history of nicotine dependence: Secondary | ICD-10-CM | POA: Insufficient documentation

## 2019-08-30 DIAGNOSIS — M2550 Pain in unspecified joint: Secondary | ICD-10-CM

## 2019-08-30 DIAGNOSIS — M797 Fibromyalgia: Secondary | ICD-10-CM

## 2019-08-30 DIAGNOSIS — Z760 Encounter for issue of repeat prescription: Secondary | ICD-10-CM | POA: Insufficient documentation

## 2019-08-30 DIAGNOSIS — G5793 Unspecified mononeuropathy of bilateral lower limbs: Secondary | ICD-10-CM

## 2019-08-30 DIAGNOSIS — M5136 Other intervertebral disc degeneration, lumbar region: Secondary | ICD-10-CM | POA: Insufficient documentation

## 2019-08-30 DIAGNOSIS — F419 Anxiety disorder, unspecified: Secondary | ICD-10-CM | POA: Insufficient documentation

## 2019-08-30 DIAGNOSIS — M4722 Other spondylosis with radiculopathy, cervical region: Secondary | ICD-10-CM | POA: Insufficient documentation

## 2019-08-30 DIAGNOSIS — F329 Major depressive disorder, single episode, unspecified: Secondary | ICD-10-CM | POA: Insufficient documentation

## 2019-08-30 DIAGNOSIS — M25511 Pain in right shoulder: Secondary | ICD-10-CM | POA: Insufficient documentation

## 2019-08-30 MED ORDER — HYDROCODONE-ACETAMINOPHEN 7.5-325 MG PO TABS: 2.0000 | ORAL_TABLET | Freq: Three times a day (TID) | ORAL | 0 refills | Status: DC | PRN

## 2019-08-30 NOTE — Progress Notes (Signed)
Subjective:    Patient ID: [name], female    DOB: [birth year], 44 y.o.   MRN: [id number]  HPI: [name] is a 44 y.o. female whose appointment was changed to virtual visit, due to family exposure to family members recently diagnosed with  [place].  Ms. Flud agrees to virtual visit and verbalizes understanding. She states her pain is located in her bilateral hands, neck,lower back and bilateral feet with tingling and burning. She rates her pain 7. Her current exercise regime is walking,performing stretching exercises and riding stationary bicycle for one minute three days a week.   Ms. Ricotta Morphine equivalent is 45.00 MME. She is also prescribed clonazepam  by Dr. Gerarda Fraction. We have discussed the black box warning of using opioids and benzodiazepines. I highlighted the dangers of using these drugs together and discussed the adverse events including respiratory suppression, overdose, cognitive impairment and importance of compliance with current regimen. We will continue to monitor and adjust as indicated.   Last UDS was Performed on 05/30/2019, it was consistent.   Jasmine December CMA asked the Health and History Questions. This provider and Willette Alma verified we were speaking with the correct person using two identifiers.   Pain Inventory Average Pain 9 Pain Right Now 7 My pain is intermittent, sharp, burning, stabbing, tingling and aching  In the last 24 hours, has pain interfered with the following? General activity 7 Relation with others 7 Enjoyment of life 7 What TIME of day is your pain at its worst? morning, evening, night Sleep (in general) Fair  Pain is worse with: walking, bending, sitting, inactivity, standing and some activites Pain improves with: rest, heat/ice, therapy/exercise, medication and TENS Relief from Meds: 7  Mobility walk without assistance how many minutes can you walk? 20-25 ability to climb steps?  yes do you drive?  yes  Function disabled:  date disabled . I need assistance with the following:  meal prep, household duties and shopping  Neuro/Psych weakness numbness tingling spasms  Prior Studies Any changes since last visit?  no  Physicians involved in your care Any changes since last visit?  yes Neurosurgeon Dr. Arnoldo Morale   Family History  Problem Relation Age of Onset  . Diabetes type II Mother   . Colon polyps Mother   . Diverticulitis Mother   . Diabetes Mother   . Heart disease Mother   . Osteoporosis Father   . Diabetes type II Brother   . Uterine cancer Other   . Ovarian cancer Other   . COPD Paternal Grandfather   . Heart attack Maternal Grandmother    Social History   Socioeconomic History  . Marital status: Married    Spouse name: Not on file  . Number of children: 2  . Years of education: Not on file  . Highest education level: Not on file  Occupational History  . Occupation: Disability    Employer: UNEMPLOYED  Tobacco Use  . Smoking status: Former Smoker    Types: Cigarettes    Quit date: 06/30/2011    Years since quitting: 8.1  . Smokeless tobacco: Never Used  Substance and Sexual Activity  . Alcohol use: No    Alcohol/week: 0.0 standard drinks  . Drug use: No  . Sexual activity: Yes  Other Topics Concern  . Not on file  Social History Narrative  . Not on file   Social Determinants of Health   Financial Resource Strain:   . Difficulty of Paying Living Expenses: Not on  file  Food Insecurity:   . Worried About Charity fundraiser in the Last Year: Not on file  . Ran Out of Food in the Last Year: Not on file  Transportation Needs:   . Lack of Transportation (Medical): Not on file  . Lack of Transportation (Non-Medical): Not on file  Physical Activity:   . Days of Exercise per Week: Not on file  . Minutes of Exercise per Session: Not on file  Stress:   . Feeling of Stress : Not on file  Social Connections:   . Frequency of Communication with Friends and Family: Not on file   . Frequency of Social Gatherings with Friends and Family: Not on file  . Attends Religious Services: Not on file  . Active Member of Clubs or Organizations: Not on file  . Attends Archivist Meetings: Not on file  . Marital Status: Not on file   Past Surgical History:  Procedure Laterality Date  . CESAREAN SECTION    . Left elbow surgery    . PARTIAL HYSTERECTOMY    . TONSILLECTOMY     Past Medical History:  Diagnosis Date  . Anxiety   . Chronic pain    Dr. Letta Pate  . Degenerative disc disease    Spinal, small syrinx  . Depression   . Esophageal stricture    Status post dilatation  . Fibromyalgia   . GERD (gastroesophageal reflux disease)   . Hiatal hernia   . Internal hemorrhoids without mention of complication   . Irritable bowel syndrome   . Palpitations    No documented arrhythmias  . Personal history of colonic polyps 10/04/2000   HYPERPLASTIC POLYP  . POTS (postural orthostatic tachycardia syndrome)   . Syncope    Negative tilt table test  . Syringomyelia and syringobulbia (HCC)    BP 111/76 Comment: pt reported, virtual visit  Pulse 95 Comment: pt reported, virtual visit  Ht 5' 7"  (1.702 m) Comment: pt reported, virtual visit  Wt 146 lb (66.2 kg) Comment: pt reported, virtual visit  SpO2 98% Comment: pt reported, virtual visit  BMI 22.87 kg/m   Opioid Risk Score:   Fall Risk Score:  `1  Depression screen PHQ 2/9  Depression screen [facility] 2/9 01/03/2019 11/09/2018 04/25/2018 09/20/2017 07/22/2017 05/24/2017 05/26/2016  Decreased Interest 1 1 1 1 1 1 1   Down, Depressed, Hopeless 1 1 1 1 1 1 1   PHQ - 2 Score 2 2 2 2 2 2 2   Altered sleeping - - - - - - -  Tired, decreased energy - - - - - - -  Change in appetite - - - - - - -  Feeling bad or failure about yourself  - - - - - - -  Trouble concentrating - - - - - - -  Moving slowly or fidgety/restless - - - - - - -  Suicidal thoughts - - - - - - -  PHQ-9 Score - - - - - - -  Some recent data might be  hidden     Review of Systems  Musculoskeletal: Positive for back pain.       Spasms  Neurological: Positive for dizziness and weakness.       Tingling       Objective:   Physical Exam Vitals and nursing note reviewed.  Musculoskeletal:     Comments: No Physical Exam: Virtual Visit           Assessment & Plan:  1.  Lumbar degenerative disc disease:08/30/2019. Continue with current treatment regimen:Refilled:HYDROcodone 7.5/325 mg two tablets every 8 hours as needed #180. We will continue the opioid monitoring program, this consists of regular clinic visits, examinations, urine drug screen, pill counts as well as use of [place] Controlled Substance Reporting System.  2. Thoracic Spondylosis: Continuecurrent treatment withHEP and current medication regime.08/30/2019. 3 Cervicalgia/ Cervical Radiculitis/ Cervical spondylosis without evidence of myelopathy:ContinueGabapentin.Continue to Monitor.08/30/2019. Lyrica discontinued due to adverse effects. Continue with exercise and heat therapy.08/30/2019 4. Fibromyalgia:Continue with Heat and exerciseRegimen.ContinueGabapentin.08/30/2019 5.Depression and anxiety: PCP Following.08/30/2019 6.Orthostatic hypotension: Cardiology Following.08/30/2019 7. Polyarthralgia: Continueto monitor.08/30/2019 8. ChronicBilateralShoulder Pain:Continue current medication regime. Continue HEP as Tolerated.08/30/2019. 9. Muscle Spasm:ContinueRobaxin.08/30/2019. 10. Neuropathic Pain:Continue:Gabapentin.08/30/2019 11. Right elbow Pain:No complaints today.Continue current medication regimen. Continue to monitor.08/30/2019 12.BilateralKnee with OAR>L:No complaints Today. Continue HEP as Tolerated.Continue current medication regimenwith Mobic. Continue to monitor.08/30/2019  F/U in 1 month  Virtual Visit Established Patient Location of Patient: In Her Home Location of Provider: Office Total Time  Spent: 10 Minutes

## 2019-08-31 ENCOUNTER — Encounter: Payer: Medicare Other | Admitting: Registered Nurse

## 2019-09-18 DIAGNOSIS — E538 Deficiency of other specified B group vitamins: Secondary | ICD-10-CM | POA: Diagnosis not present

## 2019-09-18 DIAGNOSIS — E559 Vitamin D deficiency, unspecified: Secondary | ICD-10-CM | POA: Diagnosis not present

## 2019-09-18 DIAGNOSIS — I10 Essential (primary) hypertension: Secondary | ICD-10-CM | POA: Diagnosis not present

## 2019-09-18 DIAGNOSIS — R5383 Other fatigue: Secondary | ICD-10-CM | POA: Diagnosis not present

## 2019-09-18 DIAGNOSIS — Z0000 Encounter for general adult medical examination without abnormal findings: Secondary | ICD-10-CM | POA: Diagnosis not present

## 2019-09-18 DIAGNOSIS — Z0001 Encounter for general adult medical examination with abnormal findings: Secondary | ICD-10-CM | POA: Diagnosis not present

## 2019-09-18 DIAGNOSIS — Z1389 Encounter for screening for other disorder: Secondary | ICD-10-CM | POA: Diagnosis not present

## 2019-09-18 DIAGNOSIS — G894 Chronic pain syndrome: Secondary | ICD-10-CM | POA: Diagnosis not present

## 2019-09-18 DIAGNOSIS — R7309 Other abnormal glucose: Secondary | ICD-10-CM | POA: Diagnosis not present

## 2019-09-18 DIAGNOSIS — Z6824 Body mass index (BMI) 24.0-24.9, adult: Secondary | ICD-10-CM | POA: Diagnosis not present

## 2019-09-18 DIAGNOSIS — E063 Autoimmune thyroiditis: Secondary | ICD-10-CM | POA: Diagnosis not present

## 2019-09-18 DIAGNOSIS — R109 Unspecified abdominal pain: Secondary | ICD-10-CM | POA: Diagnosis not present

## 2019-09-18 DIAGNOSIS — E782 Mixed hyperlipidemia: Secondary | ICD-10-CM | POA: Diagnosis not present

## 2019-09-28 ENCOUNTER — Encounter: Payer: Medicare Other | Attending: Physical Medicine & Rehabilitation | Admitting: Registered Nurse

## 2019-09-28 ENCOUNTER — Encounter: Payer: Self-pay | Admitting: Registered Nurse

## 2019-09-28 ENCOUNTER — Other Ambulatory Visit: Payer: Self-pay

## 2019-09-28 VITALS — BP 116/84 | HR 101 | Temp 98.0°F | Ht 67.0 in | Wt 150.0 lb

## 2019-09-28 DIAGNOSIS — Z86010 Personal history of colonic polyps: Secondary | ICD-10-CM | POA: Insufficient documentation

## 2019-09-28 DIAGNOSIS — M4722 Other spondylosis with radiculopathy, cervical region: Secondary | ICD-10-CM | POA: Diagnosis not present

## 2019-09-28 DIAGNOSIS — M4716 Other spondylosis with myelopathy, lumbar region: Secondary | ICD-10-CM

## 2019-09-28 DIAGNOSIS — M25511 Pain in right shoulder: Secondary | ICD-10-CM | POA: Diagnosis not present

## 2019-09-28 DIAGNOSIS — F329 Major depressive disorder, single episode, unspecified: Secondary | ICD-10-CM | POA: Diagnosis not present

## 2019-09-28 DIAGNOSIS — Z87891 Personal history of nicotine dependence: Secondary | ICD-10-CM | POA: Insufficient documentation

## 2019-09-28 DIAGNOSIS — Z760 Encounter for issue of repeat prescription: Secondary | ICD-10-CM | POA: Diagnosis not present

## 2019-09-28 DIAGNOSIS — M47814 Spondylosis without myelopathy or radiculopathy, thoracic region: Secondary | ICD-10-CM | POA: Insufficient documentation

## 2019-09-28 DIAGNOSIS — M2550 Pain in unspecified joint: Secondary | ICD-10-CM

## 2019-09-28 DIAGNOSIS — G8929 Other chronic pain: Secondary | ICD-10-CM

## 2019-09-28 DIAGNOSIS — M542 Cervicalgia: Secondary | ICD-10-CM

## 2019-09-28 DIAGNOSIS — M62838 Other muscle spasm: Secondary | ICD-10-CM | POA: Diagnosis not present

## 2019-09-28 DIAGNOSIS — I951 Orthostatic hypotension: Secondary | ICD-10-CM | POA: Diagnosis not present

## 2019-09-28 DIAGNOSIS — M797 Fibromyalgia: Secondary | ICD-10-CM | POA: Insufficient documentation

## 2019-09-28 DIAGNOSIS — Z5181 Encounter for therapeutic drug level monitoring: Secondary | ICD-10-CM

## 2019-09-28 DIAGNOSIS — G894 Chronic pain syndrome: Secondary | ICD-10-CM | POA: Diagnosis not present

## 2019-09-28 DIAGNOSIS — M792 Neuralgia and neuritis, unspecified: Secondary | ICD-10-CM

## 2019-09-28 DIAGNOSIS — K219 Gastro-esophageal reflux disease without esophagitis: Secondary | ICD-10-CM | POA: Diagnosis not present

## 2019-09-28 DIAGNOSIS — M25521 Pain in right elbow: Secondary | ICD-10-CM | POA: Diagnosis not present

## 2019-09-28 DIAGNOSIS — F419 Anxiety disorder, unspecified: Secondary | ICD-10-CM | POA: Insufficient documentation

## 2019-09-28 DIAGNOSIS — G5793 Unspecified mononeuropathy of bilateral lower limbs: Secondary | ICD-10-CM

## 2019-09-28 DIAGNOSIS — M5136 Other intervertebral disc degeneration, lumbar region: Secondary | ICD-10-CM | POA: Diagnosis not present

## 2019-09-28 DIAGNOSIS — M25512 Pain in left shoulder: Secondary | ICD-10-CM | POA: Insufficient documentation

## 2019-09-28 DIAGNOSIS — Z79891 Long term (current) use of opiate analgesic: Secondary | ICD-10-CM

## 2019-09-28 MED ORDER — HYDROCODONE-ACETAMINOPHEN 7.5-325 MG PO TABS: 2.0000 | ORAL_TABLET | Freq: Three times a day (TID) | ORAL | 0 refills | Status: DC | PRN

## 2019-09-28 NOTE — Progress Notes (Signed)
Subjective:    Patient ID: [name], female    DOB: 08-28-1975, 44 y.o.   MRN: [id number]  HPI: [name] is a 44 y.o. female who returns for follow up appointment for chronic pain and medication refill. She states her pain is located in her neck, bilateral shoulder pain,  lower back pain and bilateral hands and bilateral  feet with tingling and burning. Also reports increase intensity and frequency of neuropathic pain, we will increase her gabapentin, instructions was given, she verbalizes understanding. She rates her pain 7. Her current exercise regime is walking and performing stretching exercises.  [name] Morphine equivalent is 45.00  MME.  Last UDS was Performed on 05/30/2019, it was consistent.    Pain Inventory Average Pain 7 Pain Right Now 7 My pain is sharp, burning, dull, stabbing, tingling and aching  In the last 24 hours, has pain interfered with the following? General activity 0 Relation with others 0 Enjoyment of life 0 What TIME of day is your pain at its worst? all Sleep (in general) Fair  Pain is worse with: walking, bending, sitting, inactivity, standing, unsure and some activites Pain improves with: rest, heat/ice and medication Relief from Meds: 6  Mobility walk without assistance ability to climb steps?  yes do you drive?  yes  Function I need assistance with the following:  meal prep, household duties and shopping Do you have any goals in this area?  yes  Neuro/Psych weakness numbness tingling spasms  Prior Studies Any changes since last visit?  no  Physicians involved in your care Any changes since last visit?  no   Family History  Problem Relation Age of Onset  . Diabetes type II Mother   . Colon polyps Mother   . Diverticulitis Mother   . Diabetes Mother   . Heart disease Mother   . Osteoporosis Father   . Diabetes type II Brother   . Uterine cancer Other   . Ovarian cancer Other   . COPD Paternal Grandfather   .  Heart attack Maternal Grandmother    Social History   Socioeconomic History  . Marital status: Married    Spouse name: Not on file  . Number of children: 2  . Years of education: Not on file  . Highest education level: Not on file  Occupational History  . Occupation: Disability    Employer: UNEMPLOYED  Tobacco Use  . Smoking status: Former Smoker    Types: Cigarettes    Quit date: 06/30/2011    Years since quitting: 8.2  . Smokeless tobacco: Never Used  Substance and Sexual Activity  . Alcohol use: No    Alcohol/week: 0.0 standard drinks  . Drug use: No  . Sexual activity: Yes  Other Topics Concern  . Not on file  Social History Narrative  . Not on file   Social Determinants of Health   Financial Resource Strain:   . Difficulty of Paying Living Expenses: Not on file  Food Insecurity:   . Worried About Charity fundraiser in the Last Year: Not on file  . Ran Out of Food in the Last Year: Not on file  Transportation Needs:   . Lack of Transportation (Medical): Not on file  . Lack of Transportation (Non-Medical): Not on file  Physical Activity:   . Days of Exercise per Week: Not on file  . Minutes of Exercise per Session: Not on file  Stress:   . Feeling of Stress : Not  on file  Social Connections:   . Frequency of Communication with Friends and Family: Not on file  . Frequency of Social Gatherings with Friends and Family: Not on file  . Attends Religious Services: Not on file  . Active Member of Clubs or Organizations: Not on file  . Attends Archivist Meetings: Not on file  . Marital Status: Not on file   Past Surgical History:  Procedure Laterality Date  . CESAREAN SECTION    . Left elbow surgery    . PARTIAL HYSTERECTOMY    . TONSILLECTOMY     Past Medical History:  Diagnosis Date  . Anxiety   . Chronic pain    Dr. Letta Pate  . Degenerative disc disease    Spinal, small syrinx  . Depression   . Esophageal stricture    Status post  dilatation  . Fibromyalgia   . GERD (gastroesophageal reflux disease)   . Hiatal hernia   . Internal hemorrhoids without mention of complication   . Irritable bowel syndrome   . Palpitations    No documented arrhythmias  . Personal history of colonic polyps 10/04/2000   HYPERPLASTIC POLYP  . POTS (postural orthostatic tachycardia syndrome)   . Syncope    Negative tilt table test  . Syringomyelia and syringobulbia (HCC)    BP 116/84   Pulse (!) 101   Temp 98 F (36.7 C)   Ht 5' 7"  (1.702 m)   Wt 150 lb (68 kg)   SpO2 98%   BMI 23.49 kg/m   Opioid Risk Score:   Fall Risk Score:  `1  Depression screen PHQ 2/9  Depression screen [facility] 2/9 01/03/2019 11/09/2018 04/25/2018 09/20/2017 07/22/2017 05/24/2017 05/26/2016  Decreased Interest 1 1 1 1 1 1 1   Down, Depressed, Hopeless 1 1 1 1 1 1 1   PHQ - 2 Score 2 2 2 2 2 2 2   Altered sleeping - - - - - - -  Tired, decreased energy - - - - - - -  Change in appetite - - - - - - -  Feeling bad or failure about yourself  - - - - - - -  Trouble concentrating - - - - - - -  Moving slowly or fidgety/restless - - - - - - -  Suicidal thoughts - - - - - - -  PHQ-9 Score - - - - - - -  Some recent data might be hidden    Review of Systems  Unable to perform ROS: Acuity of condition  Constitutional: Negative.   HENT: Negative.   Eyes: Negative.   Respiratory: Negative.   Cardiovascular: Negative.   Gastrointestinal: Negative.   Endocrine: Negative.   Genitourinary: Negative.   Musculoskeletal: Positive for arthralgias, back pain, neck pain and neck stiffness.       Spasms   Allergic/Immunologic: Negative.   Neurological: Positive for weakness and numbness.       Tingling  Hematological: Negative.   Psychiatric/Behavioral: Negative.        Objective:   Physical Exam Vitals and nursing note reviewed.  Constitutional:      Appearance: Normal appearance.  Cardiovascular:     Rate and Rhythm: Normal rate and regular rhythm.      Pulses: Normal pulses.     Heart sounds: Normal heart sounds.  Musculoskeletal:     Cervical back: Normal range of motion and neck supple.     Comments: Normal Muscle Bulk and Muscle Testing Reveals:  Upper Extremities:  Full ROM and Muscle Strength 5/5  Lumbar Paraspinal Tenderness: L-3-L-5 Lower Extremities: Full ROM and Muscle Strength 5/5 Arises from Table with ease Narrow Based Gait   Skin:    General: Skin is warm and dry.  Neurological:     Mental Status: She is alert and oriented to person, place, and time.  Psychiatric:        Mood and Affect: Mood normal.        Behavior: Behavior normal.           Assessment & Plan:  1. Lumbar degenerative disc disease:09/28/2019. Continue with current treatment regimen:Refilled:HYDROcodone 7.5/325 mg two tablets every 8 hours as needed #180. We will continue the opioid monitoring program, this consists of regular clinic visits, examinations, urine drug screen, pill counts as well as use of [place] Controlled Substance Reporting System.  2. Thoracic Spondylosis: Continuecurrent treatment withHEP and current medication regime.09/28/2019. 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 Lyrica discontinued due to adverse effects. Continue with exercise and heat therapy.09/28/2019 4. Fibromyalgia:Continue with Heat and exerciseRegimen.ContinueGabapentin.09/28/2019 5.Depression and anxiety: PCP Following.09/28/2019 6.Orthostatic hypotension: Cardiology Following.09/28/2019 7. Polyarthralgia: Continueto monitor.09/28/2019 8. ChronicBilateralShoulder Pain:Continue current medication regime. Continue HEP as Tolerated.09/28/2019. 9. Muscle Spasm:ContinueRobaxin.09/28/2019. 10. Neuropathic Pain:increase Gabapentin instructions given. Send a My Chart message in two weeks to evaluate medication change, she verbalizes  understanding. 09/28/2019 11. Right elbow Pain:No complaints today.Continue current medication regimen. Continue to monitor.09/28/2019 12.BilateralKnee with OAR>L:No complaints Today. Continue HEP as Tolerated.Continue current medication regimenwith Mobic. Continue to monitor.09/28/2019  F/U in 1 month   15  minutes of face to face patient care time was spent during this visit. All questions were encouraged and answered.

## 2019-09-28 NOTE — Patient Instructions (Signed)
The Goal is to increase the Gabapentin to Two Capsules three times a day.   1st Week. Increase Gabapentin: Two Capsules in the Morning, one capsule in the afternoon and Two Capsules in the Bedtime.  If you tolerate the change, if the pain persists   2nd week: Increase Gabapentin to Two Capsules in the Morning, late afternoon and bedtime.

## 2019-10-02 DIAGNOSIS — Z0001 Encounter for general adult medical examination with abnormal findings: Secondary | ICD-10-CM | POA: Diagnosis not present

## 2019-10-02 DIAGNOSIS — Z6824 Body mass index (BMI) 24.0-24.9, adult: Secondary | ICD-10-CM | POA: Diagnosis not present

## 2019-10-02 DIAGNOSIS — I700 Atherosclerosis of aorta: Secondary | ICD-10-CM | POA: Diagnosis not present

## 2019-10-02 DIAGNOSIS — R109 Unspecified abdominal pain: Secondary | ICD-10-CM | POA: Diagnosis not present

## 2019-10-02 DIAGNOSIS — Z1389 Encounter for screening for other disorder: Secondary | ICD-10-CM | POA: Diagnosis not present

## 2019-10-02 DIAGNOSIS — J90 Pleural effusion, not elsewhere classified: Secondary | ICD-10-CM | POA: Diagnosis not present

## 2019-10-26 ENCOUNTER — Encounter: Payer: Medicare Other | Admitting: Registered Nurse

## 2019-10-27 ENCOUNTER — Encounter: Payer: Medicare Other | Attending: Physical Medicine & Rehabilitation | Admitting: Registered Nurse

## 2019-10-27 ENCOUNTER — Other Ambulatory Visit: Payer: Self-pay

## 2019-10-27 ENCOUNTER — Encounter: Payer: Self-pay | Admitting: Registered Nurse

## 2019-10-27 VITALS — BP 129/87 | HR 89 | Temp 97.9°F | Ht 67.0 in | Wt 153.0 lb

## 2019-10-27 DIAGNOSIS — F419 Anxiety disorder, unspecified: Secondary | ICD-10-CM | POA: Insufficient documentation

## 2019-10-27 DIAGNOSIS — Z79891 Long term (current) use of opiate analgesic: Secondary | ICD-10-CM | POA: Diagnosis not present

## 2019-10-27 DIAGNOSIS — M4722 Other spondylosis with radiculopathy, cervical region: Secondary | ICD-10-CM | POA: Insufficient documentation

## 2019-10-27 DIAGNOSIS — K219 Gastro-esophageal reflux disease without esophagitis: Secondary | ICD-10-CM | POA: Insufficient documentation

## 2019-10-27 DIAGNOSIS — M792 Neuralgia and neuritis, unspecified: Secondary | ICD-10-CM

## 2019-10-27 DIAGNOSIS — M25512 Pain in left shoulder: Secondary | ICD-10-CM | POA: Diagnosis not present

## 2019-10-27 DIAGNOSIS — M4716 Other spondylosis with myelopathy, lumbar region: Secondary | ICD-10-CM

## 2019-10-27 DIAGNOSIS — F329 Major depressive disorder, single episode, unspecified: Secondary | ICD-10-CM | POA: Insufficient documentation

## 2019-10-27 DIAGNOSIS — M62838 Other muscle spasm: Secondary | ICD-10-CM | POA: Insufficient documentation

## 2019-10-27 DIAGNOSIS — Z760 Encounter for issue of repeat prescription: Secondary | ICD-10-CM | POA: Insufficient documentation

## 2019-10-27 DIAGNOSIS — M25521 Pain in right elbow: Secondary | ICD-10-CM | POA: Diagnosis not present

## 2019-10-27 DIAGNOSIS — G894 Chronic pain syndrome: Secondary | ICD-10-CM | POA: Diagnosis not present

## 2019-10-27 DIAGNOSIS — M5136 Other intervertebral disc degeneration, lumbar region: Secondary | ICD-10-CM | POA: Diagnosis not present

## 2019-10-27 DIAGNOSIS — G5793 Unspecified mononeuropathy of bilateral lower limbs: Secondary | ICD-10-CM

## 2019-10-27 DIAGNOSIS — M47814 Spondylosis without myelopathy or radiculopathy, thoracic region: Secondary | ICD-10-CM | POA: Diagnosis not present

## 2019-10-27 DIAGNOSIS — M2550 Pain in unspecified joint: Secondary | ICD-10-CM

## 2019-10-27 DIAGNOSIS — M542 Cervicalgia: Secondary | ICD-10-CM

## 2019-10-27 DIAGNOSIS — Z87891 Personal history of nicotine dependence: Secondary | ICD-10-CM | POA: Diagnosis not present

## 2019-10-27 DIAGNOSIS — Z5181 Encounter for therapeutic drug level monitoring: Secondary | ICD-10-CM | POA: Diagnosis not present

## 2019-10-27 DIAGNOSIS — I951 Orthostatic hypotension: Secondary | ICD-10-CM | POA: Insufficient documentation

## 2019-10-27 DIAGNOSIS — M25511 Pain in right shoulder: Secondary | ICD-10-CM | POA: Insufficient documentation

## 2019-10-27 DIAGNOSIS — M797 Fibromyalgia: Secondary | ICD-10-CM | POA: Diagnosis not present

## 2019-10-27 DIAGNOSIS — Z86010 Personal history of colonic polyps: Secondary | ICD-10-CM | POA: Insufficient documentation

## 2019-10-27 MED ORDER — HYDROCODONE-ACETAMINOPHEN 7.5-325 MG PO TABS: 2.0000 | ORAL_TABLET | Freq: Three times a day (TID) | ORAL | 0 refills | Status: DC | PRN

## 2019-10-27 NOTE — Progress Notes (Signed)
Subjective:    Patient ID: [name], female    DOB: [birth year], 44 y.o.   MRN: [id number]  HPI: [name] is a 44 y.o. female who returns for follow up appointment for chronic pain and medication refill. She states her pain is located in her neck and lower back, also reports bilateral hands and bilateral feet pain with tingling and burning.  Also states she has generalized joint pain. She rates her pain 6. Her current exercise regime is walking and performing stretching exercises.  [name] Morphine equivalent is 45.00   MME.  She  is also prescribed Clonazepam by Dr. Gerarda Fraction. We have discussed the black box warning of using opioids and benzodiazepines. I highlighted the dangers of using these drugs together and discussed the adverse events including respiratory suppression, overdose, cognitive impairment and importance of compliance with current regimen. We will continue to monitor and adjust as indicated.   UDS ordered today.    Pain Inventory Average Pain 6 Pain Right Now 6 My pain is intermittent, sharp, burning, dull, stabbing, tingling and aching  In the last 24 hours, has pain interfered with the following? General activity 6 Relation with others 6 Enjoyment of life 7 What TIME of day is your pain at its worst? morning Sleep (in general) Fair  Pain is worse with: walking, bending, sitting, inactivity and standing Pain improves with: rest, medication and TENS Relief from Meds: 6  Mobility walk without assistance how many minutes can you walk? 25 ability to climb steps?  yes do you drive?  yes  Function disabled: date disabled 2008  Neuro/Psych weakness numbness tremor tingling spasms confusion depression anxiety  Prior Studies CT/MRI  Physicians involved in your care Primary care .   Family History  Problem Relation Age of Onset  . Diabetes type II Mother   . Colon polyps Mother   . Diverticulitis Mother   . Diabetes Mother   . Heart  disease Mother   . Osteoporosis Father   . Diabetes type II Brother   . Uterine cancer Other   . Ovarian cancer Other   . COPD Paternal Grandfather   . Heart attack Maternal Grandmother    Social History   Socioeconomic History  . Marital status: Married    Spouse name: Not on file  . Number of children: 2  . Years of education: Not on file  . Highest education level: Not on file  Occupational History  . Occupation: Disability    Employer: UNEMPLOYED  Tobacco Use  . Smoking status: Former Smoker    Types: Cigarettes    Quit date: 06/30/2011    Years since quitting: 8.3  . Smokeless tobacco: Never Used  Substance and Sexual Activity  . Alcohol use: No    Alcohol/week: 0.0 standard drinks  . Drug use: No  . Sexual activity: Yes  Other Topics Concern  . Not on file  Social History Narrative  . Not on file   Social Determinants of Health   Financial Resource Strain:   . Difficulty of Paying Living Expenses:   Food Insecurity:   . Worried About Charity fundraiser in the Last Year:   . Arboriculturist in the Last Year:   Transportation Needs:   . Film/video editor (Medical):   Marland Kitchen Lack of Transportation (Non-Medical):   Physical Activity:   . Days of Exercise per Week:   . Minutes of Exercise per Session:   Stress:   .  Feeling of Stress :   Social Connections:   . Frequency of Communication with Friends and Family:   . Frequency of Social Gatherings with Friends and Family:   . Attends Religious Services:   . Active Member of Clubs or Organizations:   . Attends Archivist Meetings:   Marland Kitchen Marital Status:    Past Surgical History:  Procedure Laterality Date  . CESAREAN SECTION    . Left elbow surgery    . PARTIAL HYSTERECTOMY    . TONSILLECTOMY     Past Medical History:  Diagnosis Date  . Anxiety   . Chronic pain    Dr. Letta Pate  . Degenerative disc disease    Spinal, small syrinx  . Depression   . Esophageal stricture    Status post  dilatation  . Fibromyalgia   . GERD (gastroesophageal reflux disease)   . Hiatal hernia   . Internal hemorrhoids without mention of complication   . Irritable bowel syndrome   . Palpitations    No documented arrhythmias  . Personal history of colonic polyps 10/04/2000   HYPERPLASTIC POLYP  . POTS (postural orthostatic tachycardia syndrome)   . Syncope    Negative tilt table test  . Syringomyelia and syringobulbia (HCC)    Temp 97.9 F (36.6 C)   Ht 5' 7"  (1.702 m)   Wt 153 lb (69.4 kg)   BMI 23.96 kg/m   Opioid Risk Score:   Fall Risk Score:  `1  Depression screen PHQ 2/9  Depression screen [facility] 2/9 01/03/2019 11/09/2018 04/25/2018 09/20/2017 07/22/2017 05/24/2017 05/26/2016  Decreased Interest 1 1 1 1 1 1 1   Down, Depressed, Hopeless 1 1 1 1 1 1 1   PHQ - 2 Score 2 2 2 2 2 2 2   Altered sleeping - - - - - - -  Tired, decreased energy - - - - - - -  Change in appetite - - - - - - -  Feeling bad or failure about yourself  - - - - - - -  Trouble concentrating - - - - - - -  Moving slowly or fidgety/restless - - - - - - -  Suicidal thoughts - - - - - - -  PHQ-9 Score - - - - - - -  Some recent data might be hidden     Review of Systems  All other systems reviewed and are negative.      Objective:   Physical Exam Vitals and nursing note reviewed.  Constitutional:      Appearance: Normal appearance.  Neck:     Comments: Cervical Paraspinal Tenderness: C-5-C-6 Cardiovascular:     Rate and Rhythm: Normal rate and regular rhythm.     Pulses: Normal pulses.     Heart sounds: Normal heart sounds.  Pulmonary:     Effort: Pulmonary effort is normal.     Breath sounds: Normal breath sounds.  Musculoskeletal:     Cervical back: Normal range of motion and neck supple.     Comments: Normal Muscle Bulk and Muscle Testing Reveals:  Upper Extremities: Full ROM and Muscle Strength 5/5 Lumbar Paraspinal Tenderness: L-3-L-5 Lower Extremities: Full ROM and Muscle Strength 5/5 Arises  from Table with Ease Narrow Based Gait   Skin:    General: Skin is warm and dry.  Neurological:     Mental Status: She is alert and oriented to person, place, and time.  Psychiatric:        Mood and Affect: Mood normal.  Behavior: Behavior normal.           Assessment & Plan:  1. Lumbar degenerative disc disease:10/27/2019. Continue with current treatment regimen:Refilled:HYDROcodone 7.5/325 mg two tablets every 8 hours as needed #180. We will continue the opioid monitoring program, this consists of regular clinic visits, examinations, urine drug screen, pill counts as well as use of [place] Controlled Substance Reporting System.  2. Thoracic Spondylosis: Continuecurrent treatment withHEP and current medication regime.10/27/2019. 3 Cervicalgia/ Cervical Radiculitis/ Cervical spondylosis without evidence of myelopathy:ContinueGabapentin.Continue to Monitor.10/27/2019. Lyrica discontinued due to adverse effects. Continue with exercise and heat therapy.10/27/2019 4. Fibromyalgia:Continue with Heat and exerciseRegimen.ContinueGabapentin.10/27/2019 5.Depression and anxiety: PCP Following.10/27/2019 6.Orthostatic hypotension: Cardiology Following.10/27/2019 7. Polyarthralgia: Continueto monitor.10/27/2019 8. ChronicBilateralShoulder Pain:Continue current medication regime. Continue HEP as Tolerated.10/27/2019. 9. Muscle Spasm:ContinueRobaxin.10/27/2019. 10. Neuropathic Pain:Continue Gabapentin. 10/27/2019 11. Right elbow Pain:No complaints today.Continue current medication regimen. Continue to monitor.10/27/2019 12.BilateralKnee with OAR>L:No complaints Today.Continue HEP as Tolerated.Continue current medication regimenwith Mobic. Continue to monitor.10/27/2019  F/U in 1 month  15 minutes of face to face patient care time was spent during this visit. All questions were encouraged and answered.

## 2019-11-01 LAB — DRUG TOX MONITOR 1 W/CONF, ORAL FLD
Alprazolam: NEGATIVE ng/mL (ref <0.50)
Amobarbital: NEGATIVE ng/mL (ref <10)
Amphetamine: 490 ng/mL — ABNORMAL HIGH (ref <10)
Amphetamines: POSITIVE ng/mL — AB (ref <10)
Barbiturates: POSITIVE ng/mL — AB (ref <10)
Benzodiazepines: POSITIVE ng/mL — AB (ref <0.50)
Buprenorphine: 0.22 ng/mL — ABNORMAL HIGH (ref <0.10)
Buprenorphine: POSITIVE ng/mL — AB (ref <0.10)
Butalbital: >500 ng/mL — ABNORMAL HIGH (ref <10)
Chlordiazepoxide: NEGATIVE ng/mL (ref <0.50)
Clonazepam: 2.26 ng/mL — ABNORMAL HIGH (ref <0.50)
Cocaine: NEGATIVE ng/mL (ref <5.0)
Codeine: NEGATIVE ng/mL (ref <2.5)
Cotinine: 66.6 ng/mL — ABNORMAL HIGH (ref <5.0)
Diazepam: NEGATIVE ng/mL (ref <0.50)
Dihydrocodeine: 6.7 ng/mL — ABNORMAL HIGH (ref <2.5)
Fentanyl: NEGATIVE ng/mL (ref <0.10)
Flunitrazepam: NEGATIVE ng/mL (ref <0.50)
Flurazepam: NEGATIVE ng/mL (ref <0.50)
Heroin Metabolite: NEGATIVE ng/mL (ref <1.0)
Hydrocodone: >250 ng/mL — ABNORMAL HIGH (ref <2.5)
Hydromorphone: NEGATIVE ng/mL (ref <2.5)
Lorazepam: NEGATIVE ng/mL (ref <0.50)
MARIJUANA: NEGATIVE ng/mL (ref <2.5)
MDMA: NEGATIVE ng/mL (ref <10)
Meprobamate: NEGATIVE ng/mL (ref <2.5)
Methadone: NEGATIVE ng/mL (ref <5.0)
Methamphetamine: NEGATIVE ng/mL (ref <10)
Midazolam: NEGATIVE ng/mL (ref <0.50)
Morphine: NEGATIVE ng/mL (ref <2.5)
Naloxone: NEGATIVE ng/mL (ref <0.25)
Nicotine Metabolite: POSITIVE ng/mL — AB (ref <5.0)
Norbuprenorphine: NEGATIVE ng/mL (ref <0.50)
Nordiazepam: NEGATIVE ng/mL (ref <0.50)
Norhydrocodone: 21.5 ng/mL — ABNORMAL HIGH (ref <2.5)
Noroxycodone: NEGATIVE ng/mL (ref <2.5)
Opiates: POSITIVE ng/mL — AB (ref <2.5)
Oxazepam: NEGATIVE ng/mL (ref <0.50)
Oxycodone: NEGATIVE ng/mL (ref <2.5)
Oxymorphone: NEGATIVE ng/mL (ref <2.5)
Pentobarbital: NEGATIVE ng/mL (ref <10)
Phencyclidine: NEGATIVE ng/mL (ref <10)
Phenobarbital: NEGATIVE ng/mL (ref <10)
Secobarbital: NEGATIVE ng/mL (ref <10)
Tapentadol: NEGATIVE ng/mL (ref <5.0)
Temazepam: NEGATIVE ng/mL (ref <0.50)
Tramadol: NEGATIVE ng/mL (ref <5.0)
Triazolam: NEGATIVE ng/mL (ref <0.50)
Zolpidem: NEGATIVE ng/mL (ref <5.0)

## 2019-11-01 LAB — DRUG TOX ALC METAB W/CON, ORAL FLD: Alcohol Metabolite: NEGATIVE ng/mL (ref <25)

## 2019-11-07 ENCOUNTER — Telehealth: Payer: Self-pay

## 2019-11-07 NOTE — Telephone Encounter (Signed)
UDS RESULTS CONSISTENT WITH MEDICATIONS ON FILE

## 2019-11-23 ENCOUNTER — Encounter: Payer: Medicare Other | Attending: Physical Medicine & Rehabilitation | Admitting: Registered Nurse

## 2019-11-23 ENCOUNTER — Other Ambulatory Visit: Payer: Self-pay

## 2019-11-23 ENCOUNTER — Encounter: Payer: Self-pay | Admitting: Registered Nurse

## 2019-11-23 VITALS — BP 124/77 | HR 89 | Temp 97.9°F | Ht 67.0 in | Wt 149.8 lb

## 2019-11-23 DIAGNOSIS — M792 Neuralgia and neuritis, unspecified: Secondary | ICD-10-CM

## 2019-11-23 DIAGNOSIS — M25511 Pain in right shoulder: Secondary | ICD-10-CM | POA: Insufficient documentation

## 2019-11-23 DIAGNOSIS — Z87891 Personal history of nicotine dependence: Secondary | ICD-10-CM | POA: Diagnosis not present

## 2019-11-23 DIAGNOSIS — I951 Orthostatic hypotension: Secondary | ICD-10-CM | POA: Diagnosis not present

## 2019-11-23 DIAGNOSIS — M4722 Other spondylosis with radiculopathy, cervical region: Secondary | ICD-10-CM | POA: Insufficient documentation

## 2019-11-23 DIAGNOSIS — F329 Major depressive disorder, single episode, unspecified: Secondary | ICD-10-CM | POA: Diagnosis not present

## 2019-11-23 DIAGNOSIS — M5136 Other intervertebral disc degeneration, lumbar region: Secondary | ICD-10-CM | POA: Diagnosis not present

## 2019-11-23 DIAGNOSIS — G5793 Unspecified mononeuropathy of bilateral lower limbs: Secondary | ICD-10-CM

## 2019-11-23 DIAGNOSIS — Z79891 Long term (current) use of opiate analgesic: Secondary | ICD-10-CM

## 2019-11-23 DIAGNOSIS — G894 Chronic pain syndrome: Secondary | ICD-10-CM | POA: Insufficient documentation

## 2019-11-23 DIAGNOSIS — M4716 Other spondylosis with myelopathy, lumbar region: Secondary | ICD-10-CM | POA: Diagnosis not present

## 2019-11-23 DIAGNOSIS — Z86010 Personal history of colonic polyps: Secondary | ICD-10-CM | POA: Diagnosis not present

## 2019-11-23 DIAGNOSIS — M62838 Other muscle spasm: Secondary | ICD-10-CM | POA: Insufficient documentation

## 2019-11-23 DIAGNOSIS — M542 Cervicalgia: Secondary | ICD-10-CM

## 2019-11-23 DIAGNOSIS — M25512 Pain in left shoulder: Secondary | ICD-10-CM | POA: Insufficient documentation

## 2019-11-23 DIAGNOSIS — F419 Anxiety disorder, unspecified: Secondary | ICD-10-CM | POA: Diagnosis not present

## 2019-11-23 DIAGNOSIS — Z5181 Encounter for therapeutic drug level monitoring: Secondary | ICD-10-CM | POA: Diagnosis not present

## 2019-11-23 DIAGNOSIS — Z760 Encounter for issue of repeat prescription: Secondary | ICD-10-CM | POA: Diagnosis not present

## 2019-11-23 DIAGNOSIS — M25521 Pain in right elbow: Secondary | ICD-10-CM | POA: Insufficient documentation

## 2019-11-23 DIAGNOSIS — M2550 Pain in unspecified joint: Secondary | ICD-10-CM

## 2019-11-23 DIAGNOSIS — M797 Fibromyalgia: Secondary | ICD-10-CM

## 2019-11-23 DIAGNOSIS — M47814 Spondylosis without myelopathy or radiculopathy, thoracic region: Secondary | ICD-10-CM | POA: Diagnosis not present

## 2019-11-23 DIAGNOSIS — K219 Gastro-esophageal reflux disease without esophagitis: Secondary | ICD-10-CM | POA: Insufficient documentation

## 2019-11-23 MED ORDER — HYDROCODONE-ACETAMINOPHEN 7.5-325 MG PO TABS: 2.0000 | ORAL_TABLET | Freq: Three times a day (TID) | ORAL | 0 refills | Status: DC | PRN

## 2019-11-23 NOTE — Progress Notes (Signed)
Subjective:    Patient ID: [name], female    DOB: 1976/02/27, 44 y.o.   MRN: [id number]  HPI: [name] is a 44 y.o. female who returns for follow up appointment for chronic pain and medication refill. She states her pain is located in her neck, lower back pain, bilateral hands and bilateral feet pain with tingling and numbness. She rates her pain 5. Her current exercise regime is walking and performing stretching exercises.  Ms. Bayard Morphine equivalent is 45.00 MME. She is also prescribed Clonazepam by Dr. Gerarda Fraction. We have discussed the black box warning of using opioids and benzodiazepines. I highlighted the dangers of using these drugs together and discussed the adverse events including respiratory suppression, overdose, cognitive impairment and importance of compliance with current regimen. We will continue to monitor and adjust as indicated.    Last Oral Swab was Performed on 10/27/2019, it was consistent.   Pain Inventory Average Pain 7 Pain Right Now 5 My pain is intermittent, sharp, burning, dull, stabbing, tingling and aching  In the last 24 hours, has pain interfered with the following? General activity 7 Relation with others 8 Enjoyment of life 7 What TIME of day is your pain at its worst? morning and evening Sleep (in general) Fair  Pain is worse with: walking, bending, sitting, standing and some activites Pain improves with: rest, heat/ice, medication, TENS and laying down Relief from Meds: 5  Mobility how many minutes can you walk? 25 ability to climb steps?  yes do you drive?  yes  Function disabled: date disabled . I need assistance with the following:  meal prep, household duties and shopping  Neuro/Psych weakness numbness tremor tingling spasms anxiety  Prior Studies Any changes since last visit?  no  Physicians involved in your care Any changes since last visit?  no   Family History  Problem Relation Age of Onset  . Diabetes  type II Mother   . Colon polyps Mother   . Diverticulitis Mother   . Diabetes Mother   . Heart disease Mother   . Osteoporosis Father   . Diabetes type II Brother   . Uterine cancer Other   . Ovarian cancer Other   . COPD Paternal Grandfather   . Heart attack Maternal Grandmother    Social History   Socioeconomic History  . Marital status: Married    Spouse name: Not on file  . Number of children: 2  . Years of education: Not on file  . Highest education level: Not on file  Occupational History  . Occupation: Disability    Employer: UNEMPLOYED  Tobacco Use  . Smoking status: Former Smoker    Types: Cigarettes    Quit date: 06/30/2011    Years since quitting: 8.4  . Smokeless tobacco: Never Used  Substance and Sexual Activity  . Alcohol use: No    Alcohol/week: 0.0 standard drinks  . Drug use: No  . Sexual activity: Yes  Other Topics Concern  . Not on file  Social History Narrative  . Not on file   Social Determinants of Health   Financial Resource Strain:   . Difficulty of Paying Living Expenses:   Food Insecurity:   . Worried About Charity fundraiser in the Last Year:   . Arboriculturist in the Last Year:   Transportation Needs:   . Film/video editor (Medical):   Marland Kitchen Lack of Transportation (Non-Medical):   Physical Activity:   . Days of  Exercise per Week:   . Minutes of Exercise per Session:   Stress:   . Feeling of Stress :   Social Connections:   . Frequency of Communication with Friends and Family:   . Frequency of Social Gatherings with Friends and Family:   . Attends Religious Services:   . Active Member of Clubs or Organizations:   . Attends Archivist Meetings:   Marland Kitchen Marital Status:    Past Surgical History:  Procedure Laterality Date  . CESAREAN SECTION    . Left elbow surgery    . PARTIAL HYSTERECTOMY    . TONSILLECTOMY     Past Medical History:  Diagnosis Date  . Anxiety   . Chronic pain    Dr. Letta Pate  . Degenerative  disc disease    Spinal, small syrinx  . Depression   . Esophageal stricture    Status post dilatation  . Fibromyalgia   . GERD (gastroesophageal reflux disease)   . Hiatal hernia   . Internal hemorrhoids without mention of complication   . Irritable bowel syndrome   . Palpitations    No documented arrhythmias  . Personal history of colonic polyps 10/04/2000   HYPERPLASTIC POLYP  . POTS (postural orthostatic tachycardia syndrome)   . Syncope    Negative tilt table test  . Syringomyelia and syringobulbia (HCC)    BP 124/77   Pulse 89   Temp 97.9 F (36.6 C)   Ht 5' 7"  (1.702 m)   Wt 149 lb 12.8 oz (67.9 kg)   SpO2 98%   BMI 23.46 kg/m   Opioid Risk Score:   Fall Risk Score:  `1  Depression screen PHQ 2/9  Depression screen [facility] 2/9 11/23/2019 01/03/2019 11/09/2018 04/25/2018 09/20/2017 07/22/2017 05/24/2017  Decreased Interest 1 1 1 1 1 1 1   Down, Depressed, Hopeless 1 1 1 1 1 1 1   PHQ - 2 Score 2 2 2 2 2 2 2   Altered sleeping - - - - - - -  Tired, decreased energy - - - - - - -  Change in appetite - - - - - - -  Feeling bad or failure about yourself  - - - - - - -  Trouble concentrating - - - - - - -  Moving slowly or fidgety/restless - - - - - - -  Suicidal thoughts - - - - - - -  PHQ-9 Score - - - - - - -  Some recent data might be hidden    Review of Systems  Constitutional: Positive for diaphoresis and unexpected weight change.  Eyes: Negative.   Respiratory: Positive for shortness of breath.   Cardiovascular: Positive for leg swelling.  Gastrointestinal: Negative.   Endocrine: Negative.   Genitourinary: Negative.   Musculoskeletal:       Spasms  Skin: Negative.   Allergic/Immunologic: Negative.   Neurological: Positive for tremors, weakness and numbness.       Tingling  Hematological: Negative.   Psychiatric/Behavioral: The patient is nervous/anxious.   All other systems reviewed and are negative.      Objective:   Physical Exam Vitals and nursing note  reviewed.  Constitutional:      Appearance: Normal appearance.  HENT:     Head: Normocephalic and atraumatic.  Musculoskeletal:     Comments: Normal Muscle Bulk and Muscle Testing Reveals:  Upper Extremities: Full ROM and Muscle Strength 5/5  Lumbar Paraspinal Tenderness: L-4-L-5 Lower Extremities: Full ROM and Muscle Strength 5/5 Arises from  Table with ease Narrow Based Gait   Skin:    General: Skin is warm and dry.  Neurological:     Mental Status: She is alert and oriented to person, place, and time.  Psychiatric:        Mood and Affect: Mood normal.        Behavior: Behavior normal.           Assessment & Plan:  1. Lumbar degenerative disc disease:11/23/2019. Continue with current treatment regimen:Refilled:HYDROcodone 7.5/325 mg two tablets every 8 hours as needed #180. We will continue the opioid monitoring program, this consists of regular clinic visits, examinations, urine drug screen, pill counts as well as use of [place] Controlled Substance Reporting System.  2. Thoracic Spondylosis: Continuecurrent treatment withHEP and current medication regime.11/23/2019. 3 Cervicalgia/ Cervical Radiculitis/ Cervical spondylosis without evidence of myelopathy:ContinueGabapentin.Continue to Monitor.11/23/2019. Lyrica discontinued due to adverse effects. Continue with exercise and heat therapy.11/23/2019 4. Fibromyalgia:Continue with Heat and exerciseRegimen.ContinueGabapentin.11/23/2019 5.Depression and anxiety: PCP Following.11/23/2019 6.Orthostatic hypotension: Cardiology Following.11/23/2019 7. Polyarthralgia: Continueto monitor.11/23/2019 8. ChronicBilateralShoulder Pain:Continue current medication regime. Continue HEP as Tolerated.11/23/2019. 9. Muscle Spasm:ContinueRobaxin.11/23/2019. 10. Neuropathic Pain:Continue Gabapentin. 11/23/2019 11. Right elbow Pain:No complaints today.Continue current medication regimen. Continue to  monitor.11/23/2019 12.BilateralKnee with OAR>L:No complaints Today.Continue HEP as Tolerated.Continue current medication regimenwith Mobic. Continue to monitor.11/23/2019  F/U in 1 month  15 minutes of face to face patient care time was spent during this visit. All questions were encouraged and answered.

## 2019-12-21 ENCOUNTER — Encounter: Payer: Medicare Other | Attending: Physical Medicine & Rehabilitation | Admitting: Registered Nurse

## 2019-12-21 ENCOUNTER — Other Ambulatory Visit: Payer: Self-pay

## 2019-12-21 VITALS — BP 115/78 | HR 87 | Temp 98.7°F | Ht 67.0 in | Wt 146.6 lb

## 2019-12-21 DIAGNOSIS — I951 Orthostatic hypotension: Secondary | ICD-10-CM | POA: Diagnosis not present

## 2019-12-21 DIAGNOSIS — M4722 Other spondylosis with radiculopathy, cervical region: Secondary | ICD-10-CM | POA: Insufficient documentation

## 2019-12-21 DIAGNOSIS — G894 Chronic pain syndrome: Secondary | ICD-10-CM | POA: Diagnosis not present

## 2019-12-21 DIAGNOSIS — M25521 Pain in right elbow: Secondary | ICD-10-CM | POA: Insufficient documentation

## 2019-12-21 DIAGNOSIS — F329 Major depressive disorder, single episode, unspecified: Secondary | ICD-10-CM | POA: Diagnosis not present

## 2019-12-21 DIAGNOSIS — M542 Cervicalgia: Secondary | ICD-10-CM | POA: Diagnosis not present

## 2019-12-21 DIAGNOSIS — M2550 Pain in unspecified joint: Secondary | ICD-10-CM | POA: Insufficient documentation

## 2019-12-21 DIAGNOSIS — Z86010 Personal history of colonic polyps: Secondary | ICD-10-CM | POA: Diagnosis not present

## 2019-12-21 DIAGNOSIS — F419 Anxiety disorder, unspecified: Secondary | ICD-10-CM | POA: Insufficient documentation

## 2019-12-21 DIAGNOSIS — K219 Gastro-esophageal reflux disease without esophagitis: Secondary | ICD-10-CM | POA: Insufficient documentation

## 2019-12-21 DIAGNOSIS — Z760 Encounter for issue of repeat prescription: Secondary | ICD-10-CM | POA: Diagnosis not present

## 2019-12-21 DIAGNOSIS — M5136 Other intervertebral disc degeneration, lumbar region: Secondary | ICD-10-CM | POA: Diagnosis not present

## 2019-12-21 DIAGNOSIS — M47812 Spondylosis without myelopathy or radiculopathy, cervical region: Secondary | ICD-10-CM | POA: Insufficient documentation

## 2019-12-21 DIAGNOSIS — M797 Fibromyalgia: Secondary | ICD-10-CM | POA: Diagnosis not present

## 2019-12-21 DIAGNOSIS — M62838 Other muscle spasm: Secondary | ICD-10-CM | POA: Insufficient documentation

## 2019-12-21 DIAGNOSIS — M25511 Pain in right shoulder: Secondary | ICD-10-CM | POA: Diagnosis not present

## 2019-12-21 DIAGNOSIS — M47814 Spondylosis without myelopathy or radiculopathy, thoracic region: Secondary | ICD-10-CM | POA: Diagnosis not present

## 2019-12-21 DIAGNOSIS — Z79891 Long term (current) use of opiate analgesic: Secondary | ICD-10-CM | POA: Insufficient documentation

## 2019-12-21 DIAGNOSIS — Z87891 Personal history of nicotine dependence: Secondary | ICD-10-CM | POA: Diagnosis not present

## 2019-12-21 DIAGNOSIS — M25512 Pain in left shoulder: Secondary | ICD-10-CM | POA: Diagnosis not present

## 2019-12-21 DIAGNOSIS — M4716 Other spondylosis with myelopathy, lumbar region: Secondary | ICD-10-CM

## 2019-12-21 DIAGNOSIS — Z5181 Encounter for therapeutic drug level monitoring: Secondary | ICD-10-CM | POA: Insufficient documentation

## 2019-12-21 MED ORDER — HYDROCODONE-ACETAMINOPHEN 7.5-325 MG PO TABS: 2.0000 | ORAL_TABLET | Freq: Three times a day (TID) | ORAL | 0 refills | Status: DC | PRN

## 2019-12-21 NOTE — Progress Notes (Signed)
Subjective:    Patient ID: [name], female    DOB: Dec 30, 1975, 44 y.o.   MRN: [id number]  HPI: [name] is a 44 y.o. female who returns for follow up appointment for chronic pain and medication refill. She states her pain is located in her neck and lower back pain. Also reports generalized joint pain. She rates her pain 5. Her current exercise regime is walking and performing stretching exercises.  Ms. Dokes Morphine equivalent is  45.00 MME. UDS was ordered today.   Pain Inventory Average Pain 7 Pain Right Now 5 My pain is intermittent, sharp, burning, stabbing and tingling  In the last 24 hours, has pain interfered with the following? General activity 7 Relation with others 8 Enjoyment of life 7 What TIME of day is your pain at its worst? morning and evening Sleep (in general) Fair  Pain is worse with: walking, bending, sitting, inactivity, standing and some activites Pain improves with: rest, heat/ice, medication and TENS Relief from Meds: 6  Mobility walk without assistance how many minutes can you walk? 25 ability to climb steps?  yes do you drive?  yes  Function disabled: date disabled . I need assistance with the following:  meal prep, household duties and shopping  Neuro/Psych weakness numbness tremor tingling spasms depression anxiety  Prior Studies Any changes since last visit?  no  Physicians involved in your care Any changes since last visit?  no   Family History  Problem Relation Age of Onset  . Diabetes type II Mother   . Colon polyps Mother   . Diverticulitis Mother   . Diabetes Mother   . Heart disease Mother   . Osteoporosis Father   . Diabetes type II Brother   . Uterine cancer Other   . Ovarian cancer Other   . COPD Paternal Grandfather   . Heart attack Maternal Grandmother    Social History   Socioeconomic History  . Marital status: Married    Spouse name: Not on file  . Number of children: 2  . Years of  education: Not on file  . Highest education level: Not on file  Occupational History  . Occupation: Disability    Employer: UNEMPLOYED  Tobacco Use  . Smoking status: Former Smoker    Types: Cigarettes    Quit date: 06/30/2011    Years since quitting: 8.4  . Smokeless tobacco: Never Used  Substance and Sexual Activity  . Alcohol use: No    Alcohol/week: 0.0 standard drinks  . Drug use: No  . Sexual activity: Yes  Other Topics Concern  . Not on file  Social History Narrative  . Not on file   Social Determinants of Health   Financial Resource Strain:   . Difficulty of Paying Living Expenses:   Food Insecurity:   . Worried About Charity fundraiser in the Last Year:   . Arboriculturist in the Last Year:   Transportation Needs:   . Film/video editor (Medical):   Marland Kitchen Lack of Transportation (Non-Medical):   Physical Activity:   . Days of Exercise per Week:   . Minutes of Exercise per Session:   Stress:   . Feeling of Stress :   Social Connections:   . Frequency of Communication with Friends and Family:   . Frequency of Social Gatherings with Friends and Family:   . Attends Religious Services:   . Active Member of Clubs or Organizations:   . Attends Club or  Organization Meetings:   Marland Kitchen Marital Status:    Past Surgical History:  Procedure Laterality Date  . CESAREAN SECTION    . Left elbow surgery    . PARTIAL HYSTERECTOMY    . TONSILLECTOMY     Past Medical History:  Diagnosis Date  . Anxiety   . Chronic pain    Dr. Letta Pate  . Degenerative disc disease    Spinal, small syrinx  . Depression   . Esophageal stricture    Status post dilatation  . Fibromyalgia   . GERD (gastroesophageal reflux disease)   . Hiatal hernia   . Internal hemorrhoids without mention of complication   . Irritable bowel syndrome   . Palpitations    No documented arrhythmias  . Personal history of colonic polyps 10/04/2000   HYPERPLASTIC POLYP  . POTS (postural orthostatic  tachycardia syndrome)   . Syncope    Negative tilt table test  . 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  Opioid Risk Score:   Fall Risk Score:  `1  Depression screen PHQ 2/9  Depression screen [facility] 2/9 11/23/2019 01/03/2019 11/09/2018 04/25/2018 09/20/2017 07/22/2017 05/24/2017  Decreased Interest 1 1 1 1 1 1 1   Down, Depressed, Hopeless 1 1 1 1 1 1 1   PHQ - 2 Score 2 2 2 2 2 2 2   Altered sleeping - - - - - - -  Tired, decreased energy - - - - - - -  Change in appetite - - - - - - -  Feeling bad or failure about yourself  - - - - - - -  Trouble concentrating - - - - - - -  Moving slowly or fidgety/restless - - - - - - -  Suicidal thoughts - - - - - - -  PHQ-9 Score - - - - - - -  Some recent data might be hidden   Review of Systems     Objective:   Physical Exam Vitals and nursing note reviewed.  Constitutional:      Appearance: Normal appearance.  Neck:     Comments: Cervical Paraspinal Tenderness: C-5-C-6 Cardiovascular:     Rate and Rhythm: Normal rate and regular rhythm.     Pulses: Normal pulses.     Heart sounds: Normal heart sounds.  Pulmonary:     Effort: Pulmonary effort is normal.     Breath sounds: Normal breath sounds.  Musculoskeletal:     Cervical back: Normal range of motion and neck supple.     Comments: Normal Muscle Bulk and Muscle Testing Reveals:  Upper Extremities: Full ROM and Muscle Strength 5/5  Lumbar Paraspinal Tenderness: L-3-L-5 Lower Extremities: Full ROM and Muscle Strength  5/5 Arises from Table with Ease Narrow Based Gait   Skin:    General: Skin is warm and dry.  Neurological:     Mental Status: She is alert and oriented to person, place, and time.  Psychiatric:        Mood and Affect: Mood normal.        Behavior: Behavior normal.           Assessment & Plan:  1. Lumbar degenerative disc disease:12/21/2019. Continue with current treatment regimen:Refilled:HYDROcodone 7.5/325 mg  two tablets every 8 hours as needed #180. We will continue the opioid monitoring program, this consists of regular clinic visits, examinations, urine drug screen, pill counts as well as use of [place] Controlled Substance Reporting System.  2.  Thoracic Spondylosis: Continuecurrent treatment withHEP and current medication regime.12/21/2019. 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 Lyrica discontinued due to adverse effects. Continue with exercise and heat therapy.12/21/2019 4. Fibromyalgia:Continue with Heat and exerciseRegimen.ContinueGabapentin.12/21/2019 5.Depression and anxiety: PCP Following.12/21/2019 6.Orthostatic hypotension: Cardiology Following.12/21/2019 7. Polyarthralgia: Continueto monitor.12/21/2019 8. ChronicBilateralShoulder Pain:No complaints today.Continue current medication regime. Continue HEP as Tolerated.12/21/2019. 9. Muscle Spasm:ContinueRobaxin.12/21/2019. 10. Neuropathic Pain:ContinueGabapentin.12/21/2019 11. Right elbow Pain:No complaints today.Continue current medication regimen. Continue to monitor.12/21/2019 12.BilateralKnee with OAR>L:No complaints Today.Continue HEP as Tolerated.Continue current medication regimenwith Mobic. Continue to monitor.12/21/2019  F/U in 1 month  57mnutes of face to face patient care time was spent during this visit. All questions were encouraged and answered.

## 2019-12-25 ENCOUNTER — Encounter: Payer: Self-pay | Admitting: Registered Nurse

## 2019-12-25 LAB — TOXASSURE SELECT,+ANTIDEPR,UR

## 2020-01-02 ENCOUNTER — Telehealth: Payer: Self-pay | Admitting: Registered Nurse

## 2020-01-02 NOTE — Telephone Encounter (Signed)
Placed a call to Ms. [place] regarding her UDS Results, + ETOH, she reports she was taking Nyquil. Educated on the [employer]. Her last + ETOH was in 2016. She realizes if she has another inconsistency UDS, she will be discharged. She verbalizes understanding. A letter will be mailed out regarding the above, she verbalizes understanding.

## 2020-01-02 NOTE — Telephone Encounter (Signed)
Letter sent through [place] and will be mailed through [place] as well.

## 2020-01-18 ENCOUNTER — Encounter: Payer: Medicare Other | Admitting: Registered Nurse

## 2020-01-19 ENCOUNTER — Encounter: Payer: Medicare Other | Attending: Physical Medicine & Rehabilitation | Admitting: Registered Nurse

## 2020-01-19 ENCOUNTER — Other Ambulatory Visit: Payer: Self-pay

## 2020-01-19 ENCOUNTER — Encounter: Payer: Self-pay | Admitting: Registered Nurse

## 2020-01-19 VITALS — BP 109/69 | HR 93 | Temp 97.9°F | Ht 67.0 in | Wt 144.8 lb

## 2020-01-19 DIAGNOSIS — M47812 Spondylosis without myelopathy or radiculopathy, cervical region: Secondary | ICD-10-CM | POA: Insufficient documentation

## 2020-01-19 DIAGNOSIS — M25512 Pain in left shoulder: Secondary | ICD-10-CM | POA: Diagnosis not present

## 2020-01-19 DIAGNOSIS — F329 Major depressive disorder, single episode, unspecified: Secondary | ICD-10-CM | POA: Diagnosis not present

## 2020-01-19 DIAGNOSIS — I951 Orthostatic hypotension: Secondary | ICD-10-CM | POA: Diagnosis not present

## 2020-01-19 DIAGNOSIS — M2550 Pain in unspecified joint: Secondary | ICD-10-CM

## 2020-01-19 DIAGNOSIS — M797 Fibromyalgia: Secondary | ICD-10-CM

## 2020-01-19 DIAGNOSIS — M5136 Other intervertebral disc degeneration, lumbar region: Secondary | ICD-10-CM | POA: Diagnosis not present

## 2020-01-19 DIAGNOSIS — F419 Anxiety disorder, unspecified: Secondary | ICD-10-CM | POA: Insufficient documentation

## 2020-01-19 DIAGNOSIS — M25521 Pain in right elbow: Secondary | ICD-10-CM | POA: Diagnosis not present

## 2020-01-19 DIAGNOSIS — M4722 Other spondylosis with radiculopathy, cervical region: Secondary | ICD-10-CM | POA: Insufficient documentation

## 2020-01-19 DIAGNOSIS — Z760 Encounter for issue of repeat prescription: Secondary | ICD-10-CM | POA: Insufficient documentation

## 2020-01-19 DIAGNOSIS — Z5181 Encounter for therapeutic drug level monitoring: Secondary | ICD-10-CM | POA: Diagnosis not present

## 2020-01-19 DIAGNOSIS — G894 Chronic pain syndrome: Secondary | ICD-10-CM | POA: Insufficient documentation

## 2020-01-19 DIAGNOSIS — M4716 Other spondylosis with myelopathy, lumbar region: Secondary | ICD-10-CM | POA: Diagnosis not present

## 2020-01-19 DIAGNOSIS — M47814 Spondylosis without myelopathy or radiculopathy, thoracic region: Secondary | ICD-10-CM

## 2020-01-19 DIAGNOSIS — M25511 Pain in right shoulder: Secondary | ICD-10-CM | POA: Diagnosis not present

## 2020-01-19 DIAGNOSIS — M542 Cervicalgia: Secondary | ICD-10-CM | POA: Insufficient documentation

## 2020-01-19 DIAGNOSIS — Z86010 Personal history of colonic polyps: Secondary | ICD-10-CM | POA: Insufficient documentation

## 2020-01-19 DIAGNOSIS — M62838 Other muscle spasm: Secondary | ICD-10-CM | POA: Insufficient documentation

## 2020-01-19 DIAGNOSIS — Z87891 Personal history of nicotine dependence: Secondary | ICD-10-CM | POA: Insufficient documentation

## 2020-01-19 DIAGNOSIS — K219 Gastro-esophageal reflux disease without esophagitis: Secondary | ICD-10-CM | POA: Diagnosis not present

## 2020-01-19 DIAGNOSIS — M792 Neuralgia and neuritis, unspecified: Secondary | ICD-10-CM

## 2020-01-19 DIAGNOSIS — Z79891 Long term (current) use of opiate analgesic: Secondary | ICD-10-CM | POA: Insufficient documentation

## 2020-01-19 DIAGNOSIS — G5793 Unspecified mononeuropathy of bilateral lower limbs: Secondary | ICD-10-CM

## 2020-01-19 MED ORDER — HYDROCODONE-ACETAMINOPHEN 7.5-325 MG PO TABS: 2.0000 | ORAL_TABLET | Freq: Three times a day (TID) | ORAL | 0 refills | Status: DC | PRN

## 2020-01-19 NOTE — Progress Notes (Signed)
Subjective:    Patient ID: [name], female    DOB: May 22, 1976, 44 y.o.   MRN: [id number]  HPI: [name] is a 44 y.o. female who returns for follow up appointment for chronic pain and medication refill. She states her pain is located in neck and mid- lower back pain, also reports tingling and burning into her bilateral hands and bilateral feet.  Also states she has generalized joint pain. She  rates her pain 7. Her current exercise regime is walking and performing stretching exercises.  [name] Morphine equivalent is 45.00 MME.  UDS ordered Today.   Pain Inventory Average Pain 7 Pain Right Now 6 My pain is intermittent, sharp, dull, stabbing, tingling and aching  In the last 24 hours, has pain interfered with the following? General activity 7 Relation with others 7 Enjoyment of life 7 What TIME of day is your pain at its worst? morning, daytime, evening Sleep (in general) Fair  Pain is worse with: walking, bending, sitting, inactivity, standing, unsure and some activites Pain improves with: rest, heat/ice and medication Relief from Meds: 5  Mobility walk without assistance how many minutes can you walk? 25 plus ability to climb steps?  yes do you drive?  yes Do you have any goals in this area?  yes  Function I need assistance with the following:  meal prep, household duties and shopping Do you have any goals in this area?  yes  Neuro/Psych No problems in this area  Prior Studies Any changes since last visit?  no  Physicians involved in your care Any changes since last visit?  no   Family History  Problem Relation Age of Onset  . Diabetes type II Mother   . Colon polyps Mother   . Diverticulitis Mother   . Diabetes Mother   . Heart disease Mother   . Osteoporosis Father   . Diabetes type II Brother   . Uterine cancer Other   . Ovarian cancer Other   . COPD Paternal Grandfather   . Heart attack Maternal Grandmother    Social History    Socioeconomic History  . Marital status: Married    Spouse name: Not on file  . Number of children: 2  . Years of education: Not on file  . Highest education level: Not on file  Occupational History  . Occupation: Disability    Employer: UNEMPLOYED  Tobacco Use  . Smoking status: Former Smoker    Types: Cigarettes    Quit date: 06/30/2011    Years since quitting: 8.5  . Smokeless tobacco: Never Used  Substance and Sexual Activity  . Alcohol use: No    Alcohol/week: 0.0 standard drinks  . Drug use: No  . Sexual activity: Yes  Other Topics Concern  . Not on file  Social History Narrative  . Not on file   Social Determinants of Health   Financial Resource Strain:   . Difficulty of Paying Living Expenses:   Food Insecurity:   . Worried About Charity fundraiser in the Last Year:   . Arboriculturist in the Last Year:   Transportation Needs:   . Film/video editor (Medical):   Marland Kitchen Lack of Transportation (Non-Medical):   Physical Activity:   . Days of Exercise per Week:   . Minutes of Exercise per Session:   Stress:   . Feeling of Stress :   Social Connections:   . Frequency of Communication with Friends and Family:   .  Frequency of Social Gatherings with Friends and Family:   . Attends Religious Services:   . Active Member of Clubs or Organizations:   . Attends Archivist Meetings:   Marland Kitchen Marital Status:    Past Surgical History:  Procedure Laterality Date  . CESAREAN SECTION    . Left elbow surgery    . PARTIAL HYSTERECTOMY    . TONSILLECTOMY     Past Medical History:  Diagnosis Date  . Anxiety   . Chronic pain    Dr. Letta Pate  . Degenerative disc disease    Spinal, small syrinx  . Depression   . Esophageal stricture    Status post dilatation  . Fibromyalgia   . GERD (gastroesophageal reflux disease)   . Hiatal hernia   . Internal hemorrhoids without mention of complication   . Irritable bowel syndrome   . Palpitations    No documented  arrhythmias  . Personal history of colonic polyps 10/04/2000   HYPERPLASTIC POLYP  . POTS (postural orthostatic tachycardia syndrome)   . Syncope    Negative tilt table test  . Syringomyelia and syringobulbia ([facility])    BP 109/69   Pulse 93   Temp 97.9 F (36.6 C)   Ht 5' 7"  (1.702 m)   Wt 144 lb 12.8 oz (65.7 kg)   SpO2 96%   BMI 22.68 kg/m   Opioid Risk Score:   Fall Risk Score:  `1  Depression screen PHQ 2/9  Depression screen [facility] 2/9 11/23/2019 01/03/2019 11/09/2018 04/25/2018 09/20/2017 07/22/2017 05/24/2017  Decreased Interest 1 1 1 1 1 1 1   Down, Depressed, Hopeless 1 1 1 1 1 1 1   PHQ - 2 Score 2 2 2 2 2 2 2   Altered sleeping - - - - - - -  Tired, decreased energy - - - - - - -  Change in appetite - - - - - - -  Feeling bad or failure about yourself  - - - - - - -  Trouble concentrating - - - - - - -  Moving slowly or fidgety/restless - - - - - - -  Suicidal thoughts - - - - - - -  PHQ-9 Score - - - - - - -  Some recent data might be hidden   Review of Systems  Constitutional: Negative.   HENT: Negative.   Eyes: Negative.   Respiratory: Negative.   Cardiovascular: Negative.   Gastrointestinal: Negative.   Endocrine: Negative.   Musculoskeletal: Negative.   Skin: Negative.   Allergic/Immunologic: Negative.   Neurological: Negative.   Hematological: Negative.   Psychiatric/Behavioral: Negative.        Objective:   Physical Exam Vitals and nursing note reviewed.  Constitutional:      Appearance: Normal appearance.  Neck:     Comments: Cervical Paraspinal Tenderness: C-5-C-6 Cardiovascular:     Rate and Rhythm: Normal rate and regular rhythm.     Pulses: Normal pulses.  Pulmonary:     Effort: Pulmonary effort is normal.     Breath sounds: Normal breath sounds.  Musculoskeletal:     Cervical back: Normal range of motion and neck supple.     Comments: Normal Muscle Bulk and Muscle Testing Reveals:  Upper Extremities: Full ROM and Muscle Strength  5/5 Bilateral AC Joint Tenderness  Thoracic Paraspinal Tenderness: T-7-T-9 Lumbar Paraspinal Tenderness: L-3-L-5 Lower Extremities: Full ROM and Muscle Strength 5/5 Arises from Table with Ease Narrow Based Gait   Skin:    General: Skin  is warm and dry.  Neurological:     Mental Status: She is alert and oriented to person, place, and time.  Psychiatric:        Mood and Affect: Mood normal.        Behavior: Behavior normal.           Assessment & Plan:  1. Lumbar degenerative disc disease:01/19/2020. Continue with current treatment regimen:Refilled:HYDROcodone 7.5/325 mg two tablets every 8 hours as needed #180. We will continue the opioid monitoring program, this consists of regular clinic visits, examinations, urine drug screen, pill counts as well as use of [place] Controlled Substance Reporting System.  2. Thoracic Spondylosis: Continuecurrent treatment withHEP and current medication regime.01/19/2020. 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 Lyrica discontinued due to adverse effects. Continue with exercise and heat therapy.01/19/2020 4. Fibromyalgia:Continue with Heat and exerciseRegimen.ContinueGabapentin.01/19/2020 5.Depression and anxiety: PCP Following.01/19/2020 6.Orthostatic hypotension: Cardiology Following.01/19/2020 7. Polyarthralgia: Continueto monitor.01/19/2020 8. ChronicBilateralShoulder Pain:No complaints today.Continue current medication regime. Continue HEP as Tolerated.06/18//2021. 9. Muscle Spasm:ContinueRobaxin.01/19/2020. 10. Neuropathic Pain:ContinueGabapentin.01/19/2020 11. Right elbow Pain:No complaints today.Continue current medication regimen. Continue to monitor.01/19/2020 12.BilateralKnee with OAR>L:No complaints Today.Continue HEP as Tolerated.Continue current medication regimenwith Mobic. Continue  to monitor.01/19/2020  F/U in 1 month  57mnutes of face to face patient care time was spent during this visit. All questions were encouraged and answered.

## 2020-01-24 LAB — TOXASSURE SELECT,+ANTIDEPR,UR

## 2020-01-29 ENCOUNTER — Telehealth: Payer: Self-pay | Admitting: *Deleted

## 2020-01-29 NOTE — Telephone Encounter (Signed)
Urine drug screen for this encounter is consistent for prescribed medication 

## 2020-01-31 DIAGNOSIS — G894 Chronic pain syndrome: Secondary | ICD-10-CM | POA: Diagnosis not present

## 2020-01-31 DIAGNOSIS — I10 Essential (primary) hypertension: Secondary | ICD-10-CM | POA: Diagnosis not present

## 2020-02-14 ENCOUNTER — Other Ambulatory Visit: Payer: Self-pay

## 2020-02-14 ENCOUNTER — Encounter: Payer: Medicare Other | Attending: Physical Medicine & Rehabilitation | Admitting: Registered Nurse

## 2020-02-14 ENCOUNTER — Encounter: Payer: Self-pay | Admitting: Registered Nurse

## 2020-02-14 VITALS — BP 111/76 | HR 98 | Temp 98.4°F | Ht 67.0 in | Wt 142.0 lb

## 2020-02-14 DIAGNOSIS — G894 Chronic pain syndrome: Secondary | ICD-10-CM | POA: Diagnosis not present

## 2020-02-14 DIAGNOSIS — M25512 Pain in left shoulder: Secondary | ICD-10-CM | POA: Insufficient documentation

## 2020-02-14 DIAGNOSIS — F419 Anxiety disorder, unspecified: Secondary | ICD-10-CM | POA: Insufficient documentation

## 2020-02-14 DIAGNOSIS — F329 Major depressive disorder, single episode, unspecified: Secondary | ICD-10-CM | POA: Insufficient documentation

## 2020-02-14 DIAGNOSIS — M5136 Other intervertebral disc degeneration, lumbar region: Secondary | ICD-10-CM | POA: Insufficient documentation

## 2020-02-14 DIAGNOSIS — Z86010 Personal history of colonic polyps: Secondary | ICD-10-CM | POA: Insufficient documentation

## 2020-02-14 DIAGNOSIS — M47814 Spondylosis without myelopathy or radiculopathy, thoracic region: Secondary | ICD-10-CM | POA: Diagnosis not present

## 2020-02-14 DIAGNOSIS — M25511 Pain in right shoulder: Secondary | ICD-10-CM | POA: Diagnosis not present

## 2020-02-14 DIAGNOSIS — M62838 Other muscle spasm: Secondary | ICD-10-CM | POA: Insufficient documentation

## 2020-02-14 DIAGNOSIS — I951 Orthostatic hypotension: Secondary | ICD-10-CM | POA: Insufficient documentation

## 2020-02-14 DIAGNOSIS — Z87891 Personal history of nicotine dependence: Secondary | ICD-10-CM | POA: Diagnosis not present

## 2020-02-14 DIAGNOSIS — M4722 Other spondylosis with radiculopathy, cervical region: Secondary | ICD-10-CM | POA: Insufficient documentation

## 2020-02-14 DIAGNOSIS — G5793 Unspecified mononeuropathy of bilateral lower limbs: Secondary | ICD-10-CM

## 2020-02-14 DIAGNOSIS — M4716 Other spondylosis with myelopathy, lumbar region: Secondary | ICD-10-CM

## 2020-02-14 DIAGNOSIS — K219 Gastro-esophageal reflux disease without esophagitis: Secondary | ICD-10-CM | POA: Diagnosis not present

## 2020-02-14 DIAGNOSIS — M47812 Spondylosis without myelopathy or radiculopathy, cervical region: Secondary | ICD-10-CM

## 2020-02-14 DIAGNOSIS — M542 Cervicalgia: Secondary | ICD-10-CM

## 2020-02-14 DIAGNOSIS — Z79891 Long term (current) use of opiate analgesic: Secondary | ICD-10-CM | POA: Diagnosis not present

## 2020-02-14 DIAGNOSIS — M25521 Pain in right elbow: Secondary | ICD-10-CM | POA: Diagnosis not present

## 2020-02-14 DIAGNOSIS — M2550 Pain in unspecified joint: Secondary | ICD-10-CM | POA: Diagnosis not present

## 2020-02-14 DIAGNOSIS — M797 Fibromyalgia: Secondary | ICD-10-CM | POA: Diagnosis not present

## 2020-02-14 DIAGNOSIS — Z5181 Encounter for therapeutic drug level monitoring: Secondary | ICD-10-CM | POA: Diagnosis not present

## 2020-02-14 DIAGNOSIS — Z760 Encounter for issue of repeat prescription: Secondary | ICD-10-CM | POA: Diagnosis not present

## 2020-02-14 DIAGNOSIS — M792 Neuralgia and neuritis, unspecified: Secondary | ICD-10-CM

## 2020-02-14 MED ORDER — HYDROCODONE-ACETAMINOPHEN 7.5-325 MG PO TABS: 2.0000 | ORAL_TABLET | Freq: Three times a day (TID) | ORAL | 0 refills | Status: DC | PRN

## 2020-02-14 NOTE — Progress Notes (Addendum)
Subjective:    Patient ID: [name], female    DOB: [birth year], 43 y.o.   MRN: [id number]  HPI: [name] is a 44 y.o. female who returns for follow up appointment for chronic pain and medication refill. She states her pain is located in her right hand with tingling and numbness, neck pain, lower back pain and bilateral feet pain with tingling and burning. Also reports generalized joint pain. She rates her pain 6. Her current exercise regime is walking and performing stretching exercises.  Ms. Hartinger Morphine equivalent is  45.00  MME.   Last UDS was Performed on 01/19/2020, it was consistent.    Pain Inventory Average Pain 7 Pain Right Now 6 My pain is intermittent, sharp, burning, dull, stabbing, tingling and aching  In the last 24 hours, has pain interfered with the following? General activity 7 Relation with others 7 Enjoyment of life 7 What TIME of day is your pain at its worst? morning, evening, night Sleep (in general) Fair  Pain is worse with: walking, bending, sitting, inactivity, standing and some activites Pain improves with: rest, heat/ice, medication and TENS Relief from Meds: 7  Mobility walk without assistance ability to climb steps?  yes do you drive?  yes needs help with transfers transfers alone  Function I need assistance with the following:  meal prep, household duties and shopping  Neuro/Psych weakness numbness tremor tingling spasms  Prior Studies Any changes since last visit?  no  Physicians involved in your care Any changes since last visit?  no   Family History  Problem Relation Age of Onset  . Diabetes type II Mother   . Colon polyps Mother   . Diverticulitis Mother   . Diabetes Mother   . Heart disease Mother   . Osteoporosis Father   . Diabetes type II Brother   . Uterine cancer Other   . Ovarian cancer Other   . COPD Paternal Grandfather   . Heart attack Maternal Grandmother    Social History   Socioeconomic  History  . Marital status: Married    Spouse name: Not on file  . Number of children: 2  . Years of education: Not on file  . Highest education level: Not on file  Occupational History  . Occupation: Disability    Employer: UNEMPLOYED  Tobacco Use  . Smoking status: Former Smoker    Types: Cigarettes    Quit date: 06/30/2011    Years since quitting: 8.6  . Smokeless tobacco: Never Used  Substance and Sexual Activity  . Alcohol use: No    Alcohol/week: 0.0 standard drinks  . Drug use: No  . Sexual activity: Yes  Other Topics Concern  . Not on file  Social History Narrative  . Not on file   Social Determinants of Health   Financial Resource Strain:   . Difficulty of Paying Living Expenses:   Food Insecurity:   . Worried About Charity fundraiser in the Last Year:   . Arboriculturist in the Last Year:   Transportation Needs:   . Film/video editor (Medical):   Marland Kitchen Lack of Transportation (Non-Medical):   Physical Activity:   . Days of Exercise per Week:   . Minutes of Exercise per Session:   Stress:   . Feeling of Stress :   Social Connections:   . Frequency of Communication with Friends and Family:   . Frequency of Social Gatherings with Friends and Family:   .  Attends Religious Services:   . Active Member of Clubs or Organizations:   . Attends Archivist Meetings:   Marland Kitchen Marital Status:    Past Surgical History:  Procedure Laterality Date  . CESAREAN SECTION    . Left elbow surgery    . PARTIAL HYSTERECTOMY    . TONSILLECTOMY     Past Medical History:  Diagnosis Date  . Anxiety   . Chronic pain    Dr. Letta Pate  . Degenerative disc disease    Spinal, small syrinx  . Depression   . Esophageal stricture    Status post dilatation  . Fibromyalgia   . GERD (gastroesophageal reflux disease)   . Hiatal hernia   . Internal hemorrhoids without mention of complication   . Irritable bowel syndrome   . Palpitations    No documented arrhythmias  .  Personal history of colonic polyps 10/04/2000   HYPERPLASTIC POLYP  . POTS (postural orthostatic tachycardia syndrome)   . Syncope    Negative tilt table test  . Syringomyelia and syringobulbia ([facility])    BP 111/76   Pulse 98   Temp 98.4 F (36.9 C)   Ht 5' 7"  (1.702 m)   Wt 142 lb (64.4 kg)   SpO2 98%   BMI 22.24 kg/m   Opioid Risk Score:   Fall Risk Score:  `1  Depression screen PHQ 2/9  Depression screen [facility] 2/9 11/23/2019 01/03/2019 11/09/2018 04/25/2018 09/20/2017 07/22/2017 05/24/2017  Decreased Interest 1 1 1 1 1 1 1   Down, Depressed, Hopeless 1 1 1 1 1 1 1   PHQ - 2 Score 2 2 2 2 2 2 2   Altered sleeping - - - - - - -  Tired, decreased energy - - - - - - -  Change in appetite - - - - - - -  Feeling bad or failure about yourself  - - - - - - -  Trouble concentrating - - - - - - -  Moving slowly or fidgety/restless - - - - - - -  Suicidal thoughts - - - - - - -  PHQ-9 Score - - - - - - -  Some recent data might be hidden     Review of Systems  Constitutional: Negative.   HENT: Negative.   Eyes: Negative.   Respiratory: Negative.   Cardiovascular: Negative.   Gastrointestinal: Negative.   Endocrine: Negative.   Genitourinary: Negative.   Musculoskeletal: Positive for back pain and neck pain.       Spasms  Skin: Negative.   Allergic/Immunologic: Negative.   Neurological: Positive for tremors, weakness, numbness and headaches.       Tingling   Hematological: Negative.   Psychiatric/Behavioral: Negative.   All other systems reviewed and are negative.      Objective:   Physical Exam Vitals and nursing note reviewed.  Constitutional:      Appearance: Normal appearance.  Neck:     Comments: Cervical Paraspinal Tenderness: C-5-C-6 Cardiovascular:     Rate and Rhythm: Normal rate and regular rhythm.     Pulses: Normal pulses.     Heart sounds: Normal heart sounds.  Pulmonary:     Effort: Pulmonary effort is normal.     Breath sounds: Normal breath sounds.    Musculoskeletal:     Cervical back: Normal range of motion and neck supple.     Comments: Normal Muscle Bulk and Muscle Testing Reveals:  Upper Extremities: Full ROM and Muscle Strength 5/5  Lumbar  Paraspinal Tenderness: L-3-L-5 Lower Extremities: Full ROM and Muscle Strength 5/5 Arises from Table with ease Narrow Based Gait   Skin:    General: Skin is warm and dry.  Neurological:     Mental Status: She is alert and oriented to person, place, and time.  Psychiatric:        Mood and Affect: Mood normal.        Behavior: Behavior normal.           Assessment & Plan:  1. Lumbar degenerative disc disease:02/14/2020. Continue with current treatment regimen:Refilled:HYDROcodone 7.5/325 mg two tablets every 8 hours as needed #180. We will continue the opioid monitoring program, this consists of regular clinic visits, examinations, urine drug screen, pill counts as well as use of [place] Controlled Substance Reporting System.  2. Thoracic Spondylosis: Continuecurrent treatment withHEP and current medication regime.02/14/2020. 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 Lyrica discontinued due to adverse effects. Continue with exercise and heat therapy.02/14/2020 4. Fibromyalgia:Continue with Heat and exerciseRegimen.ContinueGabapentin.02/14/2020 5.Depression and anxiety: PCP Following.02/14/2020 6.Orthostatic hypotension: Cardiology Following.02/14/2020 7. Polyarthralgia: Continueto monitor.02/14/2020 8. ChronicBilateralShoulder Pain:No complaints today.Continue current medication regime. Continue HEP as Tolerated.07/14//2021. 9. Muscle Spasm:ContinueRobaxin. Continue to monitor. 02/14/2020. 10. Neuropathic Pain:ContinueGabapentin. Continue to Monitor. 02/14/2020 11. Right elbow Pain:No complaints today.Continue current medication regimen. Continue  to monitor.02/14/2020 12.BilateralKnee with OAR>L:No complaints Today.Continue HEP as Tolerated.Continue current medication regimenwith Mobic. Continue to monitor.02/14/2020  F/U in 1 month  51mnutes of face to face patient care time was spent during this visit. All questions were encouraged and answered.

## 2020-03-14 ENCOUNTER — Other Ambulatory Visit: Payer: Self-pay

## 2020-03-14 ENCOUNTER — Encounter: Payer: Self-pay | Admitting: Registered Nurse

## 2020-03-14 ENCOUNTER — Encounter: Payer: Medicare Other | Attending: Physical Medicine & Rehabilitation | Admitting: Registered Nurse

## 2020-03-14 VITALS — BP 111/76 | HR 100 | Temp 97.8°F | Ht 67.0 in | Wt 142.8 lb

## 2020-03-14 DIAGNOSIS — M4722 Other spondylosis with radiculopathy, cervical region: Secondary | ICD-10-CM | POA: Diagnosis not present

## 2020-03-14 DIAGNOSIS — F419 Anxiety disorder, unspecified: Secondary | ICD-10-CM | POA: Insufficient documentation

## 2020-03-14 DIAGNOSIS — I951 Orthostatic hypotension: Secondary | ICD-10-CM | POA: Insufficient documentation

## 2020-03-14 DIAGNOSIS — M5136 Other intervertebral disc degeneration, lumbar region: Secondary | ICD-10-CM | POA: Insufficient documentation

## 2020-03-14 DIAGNOSIS — M47812 Spondylosis without myelopathy or radiculopathy, cervical region: Secondary | ICD-10-CM | POA: Insufficient documentation

## 2020-03-14 DIAGNOSIS — M25511 Pain in right shoulder: Secondary | ICD-10-CM | POA: Diagnosis not present

## 2020-03-14 DIAGNOSIS — Z87891 Personal history of nicotine dependence: Secondary | ICD-10-CM | POA: Insufficient documentation

## 2020-03-14 DIAGNOSIS — F329 Major depressive disorder, single episode, unspecified: Secondary | ICD-10-CM | POA: Diagnosis not present

## 2020-03-14 DIAGNOSIS — M4716 Other spondylosis with myelopathy, lumbar region: Secondary | ICD-10-CM | POA: Insufficient documentation

## 2020-03-14 DIAGNOSIS — Z86010 Personal history of colonic polyps: Secondary | ICD-10-CM | POA: Diagnosis not present

## 2020-03-14 DIAGNOSIS — M2550 Pain in unspecified joint: Secondary | ICD-10-CM | POA: Diagnosis not present

## 2020-03-14 DIAGNOSIS — Z760 Encounter for issue of repeat prescription: Secondary | ICD-10-CM | POA: Diagnosis not present

## 2020-03-14 DIAGNOSIS — M47814 Spondylosis without myelopathy or radiculopathy, thoracic region: Secondary | ICD-10-CM | POA: Insufficient documentation

## 2020-03-14 DIAGNOSIS — Z5181 Encounter for therapeutic drug level monitoring: Secondary | ICD-10-CM

## 2020-03-14 DIAGNOSIS — K219 Gastro-esophageal reflux disease without esophagitis: Secondary | ICD-10-CM | POA: Insufficient documentation

## 2020-03-14 DIAGNOSIS — M25521 Pain in right elbow: Secondary | ICD-10-CM | POA: Diagnosis not present

## 2020-03-14 DIAGNOSIS — M62838 Other muscle spasm: Secondary | ICD-10-CM | POA: Insufficient documentation

## 2020-03-14 DIAGNOSIS — Z79891 Long term (current) use of opiate analgesic: Secondary | ICD-10-CM | POA: Diagnosis not present

## 2020-03-14 DIAGNOSIS — M25512 Pain in left shoulder: Secondary | ICD-10-CM | POA: Insufficient documentation

## 2020-03-14 DIAGNOSIS — M792 Neuralgia and neuritis, unspecified: Secondary | ICD-10-CM

## 2020-03-14 DIAGNOSIS — M797 Fibromyalgia: Secondary | ICD-10-CM | POA: Diagnosis not present

## 2020-03-14 DIAGNOSIS — G5793 Unspecified mononeuropathy of bilateral lower limbs: Secondary | ICD-10-CM

## 2020-03-14 DIAGNOSIS — M542 Cervicalgia: Secondary | ICD-10-CM

## 2020-03-14 DIAGNOSIS — G894 Chronic pain syndrome: Secondary | ICD-10-CM | POA: Insufficient documentation

## 2020-03-14 DIAGNOSIS — M1711 Unilateral primary osteoarthritis, right knee: Secondary | ICD-10-CM

## 2020-03-14 MED ORDER — HYDROCODONE-ACETAMINOPHEN 7.5-325 MG PO TABS: 2.0000 | ORAL_TABLET | Freq: Three times a day (TID) | ORAL | 0 refills | Status: DC | PRN

## 2020-03-14 NOTE — Progress Notes (Signed)
Subjective:    Patient ID: [name], female    DOB: 1975-08-29, 44 y.o.   MRN: [id number]  HPI: [name] is a 44 y.o. female who returns for follow up appointment for chronic pain and medication refill. She states her pain is located in her neck, right elbow pain, mid- lower back pain and right knee pain. Also reports generalized joint pain,right hand and bilateral feet with tingling. She rates her pain 6. Her current exercise regime is walking and performing stretching exercises.  [name] Morphine equivalent is  45.00 MME.    Last UDS was Performed on 01/19/2020, it was consistent.   Pain Inventory Average Pain 7 Pain Right Now 6 My pain is intermittent, sharp, burning, dull, stabbing, tingling and aching  In the last 24 hours, has pain interfered with the following? General activity 7 Relation with others 7 Enjoyment of life 7 What TIME of day is your pain at its worst? morning  and night Sleep (in general) Fair  Pain is worse with: walking, bending, sitting, inactivity, standing and some activites Pain improves with: rest, heat/ice, medication and TENS Relief from Meds: 8  Family History  Problem Relation Age of Onset  . Diabetes type II Mother   . Colon polyps Mother   . Diverticulitis Mother   . Diabetes Mother   . Heart disease Mother   . Osteoporosis Father   . Diabetes type II Brother   . Uterine cancer Other   . Ovarian cancer Other   . COPD Paternal Grandfather   . Heart attack Maternal Grandmother    Social History   Socioeconomic History  . Marital status: Married    Spouse name: Not on file  . Number of children: 2  . Years of education: Not on file  . Highest education level: Not on file  Occupational History  . Occupation: Disability    Employer: UNEMPLOYED  Tobacco Use  . Smoking status: Former Smoker    Types: Cigarettes    Quit date: 06/30/2011    Years since quitting: 8.7  . Smokeless tobacco: Never Used  Substance and  Sexual Activity  . Alcohol use: No    Alcohol/week: 0.0 standard drinks  . Drug use: No  . Sexual activity: Yes  Other Topics Concern  . Not on file  Social History Narrative  . Not on file   Social Determinants of Health   Financial Resource Strain:   . Difficulty of Paying Living Expenses:   Food Insecurity:   . Worried About Charity fundraiser in the Last Year:   . Arboriculturist in the Last Year:   Transportation Needs:   . Film/video editor (Medical):   Marland Kitchen Lack of Transportation (Non-Medical):   Physical Activity:   . Days of Exercise per Week:   . Minutes of Exercise per Session:   Stress:   . Feeling of Stress :   Social Connections:   . Frequency of Communication with Friends and Family:   . Frequency of Social Gatherings with Friends and Family:   . Attends Religious Services:   . Active Member of Clubs or Organizations:   . Attends Archivist Meetings:   Marland Kitchen Marital Status:    Past Surgical History:  Procedure Laterality Date  . CESAREAN SECTION    . Left elbow surgery    . PARTIAL HYSTERECTOMY    . TONSILLECTOMY     Past Surgical History:  Procedure Laterality Date  .  CESAREAN SECTION    . Left elbow surgery    . PARTIAL HYSTERECTOMY    . TONSILLECTOMY     Past Medical History:  Diagnosis Date  . Anxiety   . Chronic pain    Dr. Letta Pate  . Degenerative disc disease    Spinal, small syrinx  . Depression   . Esophageal stricture    Status post dilatation  . Fibromyalgia   . GERD (gastroesophageal reflux disease)   . Hiatal hernia   . Internal hemorrhoids without mention of complication   . Irritable bowel syndrome   . Palpitations    No documented arrhythmias  . Personal history of colonic polyps 10/04/2000   HYPERPLASTIC POLYP  . POTS (postural orthostatic tachycardia syndrome)   . Syncope    Negative tilt table test  . 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  Opioid Risk Score:    Fall Risk Score:  `1  Depression screen PHQ 2/9  Depression screen [facility] 2/9 11/23/2019 01/03/2019 11/09/2018 04/25/2018 09/20/2017 07/22/2017 05/24/2017  Decreased Interest 1 1 1 1 1 1 1   Down, Depressed, Hopeless 1 1 1 1 1 1 1   PHQ - 2 Score 2 2 2 2 2 2 2   Altered sleeping - - - - - - -  Tired, decreased energy - - - - - - -  Change in appetite - - - - - - -  Feeling bad or failure about yourself  - - - - - - -  Trouble concentrating - - - - - - -  Moving slowly or fidgety/restless - - - - - - -  Suicidal thoughts - - - - - - -  PHQ-9 Score - - - - - - -  Some recent data might be hidden    Review of Systems  Constitutional: Negative.   HENT: Negative.   Eyes: Negative.   Respiratory: Negative.   Cardiovascular: Negative.   Gastrointestinal: Negative.   Endocrine: Negative.   Genitourinary: Negative.   Musculoskeletal:       Spasms  Skin: Negative.   Allergic/Immunologic: Negative.   Neurological: Negative.        Tingling  Hematological: Negative.   Psychiatric/Behavioral: The patient is nervous/anxious.   All other systems reviewed and are negative.      Objective:   Physical Exam Vitals and nursing note reviewed.  Constitutional:      Appearance: Normal appearance.  Neck:     Comments: Cervical Paraspinal Tenderness: C-5-C-6 Cardiovascular:     Rate and Rhythm: Normal rate and regular rhythm.     Pulses: Normal pulses.     Heart sounds: Normal heart sounds.  Pulmonary:     Effort: Pulmonary effort is normal.     Breath sounds: Normal breath sounds.  Musculoskeletal:     Cervical back: Normal range of motion and neck supple.     Comments: Normal Muscle Bulk and Muscle Testing Reveals:  Upper Extremities: Full ROM and Muscle Strength 5/5 Lumbar Paraspinal Tenderness: L-3-L-5 Lower Extremities: Full ROM and Muscle Strength 5/5 Arises from Table with ease Narrow Based Gait   Skin:    General: Skin is warm and dry.  Neurological:     Mental Status: She is  alert and oriented to person, place, and time.  Psychiatric:        Mood and Affect: Mood normal.        Behavior: Behavior normal.  Assessment & Plan:  1. Lumbar degenerative disc disease:03/14/2020. Continue with current treatment regimen:Refilled:HYDROcodone 7.5/325 mg two tablets every 8 hours as needed #180. We will continue the opioid monitoring program, this consists of regular clinic visits, examinations, urine drug screen, pill counts as well as use of [place] Controlled Substance Reporting System.  2. Thoracic Spondylosis: Continuecurrent treatment withHEP and current medication regime.03/14/2020. 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 Lyrica discontinued due to adverse effects. Continue with exercise and heat therapy.03/14/2020 4. Fibromyalgia:Continue with Heat and exerciseRegimen.ContinueGabapentin.03/14/2020 5.Depression and anxiety: PCP Following.03/14/2020 6.Orthostatic hypotension: Cardiology Following.03/14/2020 7. Polyarthralgia: Continueto monitor.03/14/2020 8. ChronicBilateralShoulder Pain:No complaints today.Continue current medication regime. Continue HEP as Tolerated.08/12//2021. 9. Muscle Spasm:ContinueRobaxin. Continue to monitor. 03/14/2020. 10. Neuropathic Pain:ContinueGabapentin. Continue to Monitor. 03/14/2020 11. Right elbow Pain:Continue current medication regimen. Continue to monitor.03/14/2020 12.BilateralKnee with OAR>L:.Continue HEP as Tolerated.Continue current medication regimenwith Mobic. Continue to monitor.03/14/2020  F/U in 1 month  45mnutes of face to face patient care time was spent during this visit. All questions were encouraged and answered.

## 2020-04-03 ENCOUNTER — Other Ambulatory Visit: Payer: Self-pay | Admitting: Physical Medicine & Rehabilitation

## 2020-04-15 ENCOUNTER — Other Ambulatory Visit: Payer: Medicare Other

## 2020-04-16 ENCOUNTER — Encounter: Payer: Medicare Other | Attending: Physical Medicine & Rehabilitation | Admitting: Registered Nurse

## 2020-04-16 ENCOUNTER — Encounter: Payer: Self-pay | Admitting: Registered Nurse

## 2020-04-16 ENCOUNTER — Other Ambulatory Visit: Payer: Self-pay

## 2020-04-16 VITALS — Ht 66.0 in | Wt 145.0 lb

## 2020-04-16 DIAGNOSIS — M25512 Pain in left shoulder: Secondary | ICD-10-CM

## 2020-04-16 DIAGNOSIS — M5412 Radiculopathy, cervical region: Secondary | ICD-10-CM

## 2020-04-16 DIAGNOSIS — M25511 Pain in right shoulder: Secondary | ICD-10-CM | POA: Diagnosis not present

## 2020-04-16 DIAGNOSIS — M4716 Other spondylosis with myelopathy, lumbar region: Secondary | ICD-10-CM | POA: Diagnosis not present

## 2020-04-16 DIAGNOSIS — M542 Cervicalgia: Secondary | ICD-10-CM | POA: Diagnosis not present

## 2020-04-16 DIAGNOSIS — M62838 Other muscle spasm: Secondary | ICD-10-CM | POA: Insufficient documentation

## 2020-04-16 DIAGNOSIS — F419 Anxiety disorder, unspecified: Secondary | ICD-10-CM | POA: Insufficient documentation

## 2020-04-16 DIAGNOSIS — M2550 Pain in unspecified joint: Secondary | ICD-10-CM

## 2020-04-16 DIAGNOSIS — M47814 Spondylosis without myelopathy or radiculopathy, thoracic region: Secondary | ICD-10-CM | POA: Insufficient documentation

## 2020-04-16 DIAGNOSIS — F329 Major depressive disorder, single episode, unspecified: Secondary | ICD-10-CM | POA: Insufficient documentation

## 2020-04-16 DIAGNOSIS — M47812 Spondylosis without myelopathy or radiculopathy, cervical region: Secondary | ICD-10-CM | POA: Insufficient documentation

## 2020-04-16 DIAGNOSIS — M4722 Other spondylosis with radiculopathy, cervical region: Secondary | ICD-10-CM | POA: Insufficient documentation

## 2020-04-16 DIAGNOSIS — Z5181 Encounter for therapeutic drug level monitoring: Secondary | ICD-10-CM | POA: Insufficient documentation

## 2020-04-16 DIAGNOSIS — M25521 Pain in right elbow: Secondary | ICD-10-CM | POA: Insufficient documentation

## 2020-04-16 DIAGNOSIS — Z86010 Personal history of colonic polyps: Secondary | ICD-10-CM | POA: Insufficient documentation

## 2020-04-16 DIAGNOSIS — K219 Gastro-esophageal reflux disease without esophagitis: Secondary | ICD-10-CM | POA: Insufficient documentation

## 2020-04-16 DIAGNOSIS — I951 Orthostatic hypotension: Secondary | ICD-10-CM | POA: Insufficient documentation

## 2020-04-16 DIAGNOSIS — Z87891 Personal history of nicotine dependence: Secondary | ICD-10-CM | POA: Insufficient documentation

## 2020-04-16 DIAGNOSIS — Z79891 Long term (current) use of opiate analgesic: Secondary | ICD-10-CM

## 2020-04-16 DIAGNOSIS — M5136 Other intervertebral disc degeneration, lumbar region: Secondary | ICD-10-CM | POA: Insufficient documentation

## 2020-04-16 DIAGNOSIS — G894 Chronic pain syndrome: Secondary | ICD-10-CM | POA: Insufficient documentation

## 2020-04-16 DIAGNOSIS — Z760 Encounter for issue of repeat prescription: Secondary | ICD-10-CM | POA: Insufficient documentation

## 2020-04-16 DIAGNOSIS — G8929 Other chronic pain: Secondary | ICD-10-CM

## 2020-04-16 DIAGNOSIS — M797 Fibromyalgia: Secondary | ICD-10-CM | POA: Insufficient documentation

## 2020-04-16 MED ORDER — HYDROCODONE-ACETAMINOPHEN 7.5-325 MG PO TABS: 2.0000 | ORAL_TABLET | Freq: Three times a day (TID) | ORAL | 0 refills | Status: DC | PRN

## 2020-04-16 NOTE — Progress Notes (Deleted)
Error

## 2020-04-16 NOTE — Progress Notes (Signed)
Subjective:    Patient ID: [name], female    DOB: 02/14/1976, 44 y.o.   MRN: [id number]  HPI: [name] is a 44 y.o. female whose appointment was changed to a virtual office,Ms. Alexa called office on 04/15/2020, reporting her son was positive for COVID and his girlfriend and they live with her. She states her results were negative and she's currently on quarantine. She states she has a headache today, she awaken with a headache, neck pain radiating into her bilateral shoulders, right elbow pain, lower back pain and right knee pain occasionally. She rates her pain 7. Her current exercise regime is walking and performing stretching exercises.  [name] Morphine equivalent is 45.00  MME.    Last UDS was Performed on 01/19/2020, it was consistent.   Pain Inventory Average Pain 7 Pain Right Now 7 My pain is intermittent, sharp, burning, dull, stabbing, tingling and aching  In the last 24 hours, has pain interfered with the following? General activity 7 Relation with others 7 Enjoyment of life 7 What TIME of day is your pain at its worst? morning  and night Sleep (in general) Fair  Pain is worse with: walking, bending, sitting, standing and some activites Pain improves with: rest, heat/ice and medication Relief from Meds: 5  Family History  Problem Relation Age of Onset  . Diabetes type II Mother   . Colon polyps Mother   . Diverticulitis Mother   . Diabetes Mother   . Heart disease Mother   . Osteoporosis Father   . Diabetes type II Brother   . Uterine cancer Other   . Ovarian cancer Other   . COPD Paternal Grandfather   . Heart attack Maternal Grandmother    Social History   Socioeconomic History  . Marital status: Married    Spouse name: Not on file  . Number of children: 2  . Years of education: Not on file  . Highest education level: Not on file  Occupational History  . Occupation: Disability    Employer: UNEMPLOYED  Tobacco Use  . Smoking  status: Former Smoker    Types: Cigarettes    Quit date: 06/30/2011    Years since quitting: 8.8  . Smokeless tobacco: Never Used  Substance and Sexual Activity  . Alcohol use: No    Alcohol/week: 0.0 standard drinks  . Drug use: No  . Sexual activity: Yes  Other Topics Concern  . Not on file  Social History Narrative  . Not on file   Social Determinants of Health   Financial Resource Strain:   . Difficulty of Paying Living Expenses: Not on file  Food Insecurity:   . Worried About Charity fundraiser in the Last Year: Not on file  . Ran Out of Food in the Last Year: Not on file  Transportation Needs:   . Lack of Transportation (Medical): Not on file  . Lack of Transportation (Non-Medical): Not on file  Physical Activity:   . Days of Exercise per Week: Not on file  . Minutes of Exercise per Session: Not on file  Stress:   . Feeling of Stress : Not on file  Social Connections:   . Frequency of Communication with Friends and Family: Not on file  . Frequency of Social Gatherings with Friends and Family: Not on file  . Attends Religious Services: Not on file  . Active Member of Clubs or Organizations: Not on file  . Attends Archivist Meetings: Not  on file  . Marital Status: Not on file   Past Surgical History:  Procedure Laterality Date  . CESAREAN SECTION    . Left elbow surgery    . PARTIAL HYSTERECTOMY    . TONSILLECTOMY     Past Surgical History:  Procedure Laterality Date  . CESAREAN SECTION    . Left elbow surgery    . PARTIAL HYSTERECTOMY    . TONSILLECTOMY     Past Medical History:  Diagnosis Date  . Anxiety   . Chronic pain    Dr. Letta Pate  . Degenerative disc disease    Spinal, small syrinx  . Depression   . Esophageal stricture    Status post dilatation  . Fibromyalgia   . GERD (gastroesophageal reflux disease)   . Hiatal hernia   . Internal hemorrhoids without mention of complication   . Irritable bowel syndrome   . Palpitations      No documented arrhythmias  . Personal history of colonic polyps 10/04/2000   HYPERPLASTIC POLYP  . POTS (postural orthostatic tachycardia syndrome)   . Syncope    Negative tilt table test  . Syringomyelia and syringobulbia ([facility])    Ht 5' 6"  (1.676 m)   Wt 145 lb (65.8 kg)   BMI 23.40 kg/m   Opioid Risk Score:   Fall Risk Score:  `1  Depression screen PHQ 2/9  Depression screen [facility] 2/9 03/14/2020 11/23/2019 01/03/2019 11/09/2018 04/25/2018 09/20/2017 07/22/2017  Decreased Interest 1 1 1 1 1 1 1   Down, Depressed, Hopeless 1 1 1 1 1 1 1   PHQ - 2 Score 2 2 2 2 2 2 2   Altered sleeping - - - - - - -  Tired, decreased energy - - - - - - -  Change in appetite - - - - - - -  Feeling bad or failure about yourself  - - - - - - -  Trouble concentrating - - - - - - -  Moving slowly or fidgety/restless - - - - - - -  Suicidal thoughts - - - - - - -  PHQ-9 Score - - - - - - -  Some recent data might be hidden    Review of Systems  Musculoskeletal: Positive for back pain and neck pain.       Elbow Wrist Knee pain  Neurological: Positive for tremors, weakness, numbness and headaches.       Tingling  Psychiatric/Behavioral: The patient is nervous/anxious.   All other systems reviewed and are negative.      Objective:   Physical Exam Vitals and nursing note reviewed.  Musculoskeletal:     Comments: No Physical Exam Performed: Virtual Visit           Assessment & Plan:  1. Lumbar degenerative disc disease:04/16/2020. Continue with current treatment regimen:Refilled:HYDROcodone 7.5/325 mg two tablets every 8 hours as needed #180. We will continue the opioid monitoring program, this consists of regular clinic visits, examinations, urine drug screen, pill counts as well as use of [place] Controlled Substance Reporting System.  2. Thoracic Spondylosis: Continuecurrent treatment withHEP and current medication regime.04/16/2020. 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 Lyrica discontinued due to adverse effects. Continue with exercise and heat therapy.04/16/2020 4. Fibromyalgia:Continue with Heat and exerciseRegimen.ContinueGabapentin.04/16/2020 5.Depression and anxiety: PCP Following.04/16/2020 6.Orthostatic hypotension: Cardiology Following.04/16/2020 7. Polyarthralgia: Continueto monitor.04/16/2020 8. ChronicBilateralShoulder Pain:Continue current medication regime. Continue HEP as Tolerated.09/14//2021. 9. Muscle Spasm:ContinueRobaxin.Continue to monitor.04/16/2020. 10. Neuropathic Pain:ContinueGabapentin.Continue to Monitor.04/16/2020  11. Right elbow Pain:Continue current medication regimen. Continue to monitor.04/16/2020 12.BilateralKnee with OAR>L: Continue HEP as Tolerated.Continue current medication regimenwith Mobic. Continue to monitor.04/16/2020  F/U in 1 month  Virtual Visit: MY-Chart Video Established Patient Location of Patient: In Her Home Location of Provider: In the Office Total Time Spent: 20 Minutes.

## 2020-05-02 DIAGNOSIS — G894 Chronic pain syndrome: Secondary | ICD-10-CM | POA: Diagnosis not present

## 2020-05-02 DIAGNOSIS — I10 Essential (primary) hypertension: Secondary | ICD-10-CM | POA: Diagnosis not present

## 2020-05-15 ENCOUNTER — Encounter: Payer: Medicare Other | Attending: Physical Medicine & Rehabilitation | Admitting: Registered Nurse

## 2020-05-15 ENCOUNTER — Other Ambulatory Visit: Payer: Self-pay

## 2020-05-15 ENCOUNTER — Encounter: Payer: Self-pay | Admitting: Registered Nurse

## 2020-05-15 VITALS — BP 133/86 | HR 101 | Temp 98.2°F | Ht 66.0 in | Wt 146.0 lb

## 2020-05-15 DIAGNOSIS — M47812 Spondylosis without myelopathy or radiculopathy, cervical region: Secondary | ICD-10-CM | POA: Diagnosis not present

## 2020-05-15 DIAGNOSIS — G8929 Other chronic pain: Secondary | ICD-10-CM | POA: Diagnosis not present

## 2020-05-15 DIAGNOSIS — M25512 Pain in left shoulder: Secondary | ICD-10-CM | POA: Diagnosis not present

## 2020-05-15 DIAGNOSIS — M4716 Other spondylosis with myelopathy, lumbar region: Secondary | ICD-10-CM

## 2020-05-15 DIAGNOSIS — M25511 Pain in right shoulder: Secondary | ICD-10-CM | POA: Insufficient documentation

## 2020-05-15 DIAGNOSIS — M542 Cervicalgia: Secondary | ICD-10-CM | POA: Diagnosis not present

## 2020-05-15 DIAGNOSIS — M797 Fibromyalgia: Secondary | ICD-10-CM | POA: Insufficient documentation

## 2020-05-15 DIAGNOSIS — Z79891 Long term (current) use of opiate analgesic: Secondary | ICD-10-CM | POA: Diagnosis not present

## 2020-05-15 DIAGNOSIS — M2550 Pain in unspecified joint: Secondary | ICD-10-CM | POA: Insufficient documentation

## 2020-05-15 DIAGNOSIS — M25521 Pain in right elbow: Secondary | ICD-10-CM | POA: Diagnosis not present

## 2020-05-15 DIAGNOSIS — M5412 Radiculopathy, cervical region: Secondary | ICD-10-CM | POA: Insufficient documentation

## 2020-05-15 DIAGNOSIS — Z5181 Encounter for therapeutic drug level monitoring: Secondary | ICD-10-CM | POA: Diagnosis not present

## 2020-05-15 DIAGNOSIS — G894 Chronic pain syndrome: Secondary | ICD-10-CM | POA: Insufficient documentation

## 2020-05-15 MED ORDER — HYDROCODONE-ACETAMINOPHEN 7.5-325 MG PO TABS: 2.0000 | ORAL_TABLET | Freq: Three times a day (TID) | ORAL | 0 refills | Status: DC | PRN

## 2020-05-15 NOTE — Progress Notes (Signed)
Subjective:    Patient ID: [name], female    DOB: Oct 29, 1975, 44 y.o.   MRN: [id number]  HPI: [name] is a 44 y.o. female who returns for follow up appointment for chronic pain and medication refill. She states her  pain is located in her neck radiating into her bilateral arms and bilateral  hands with tingling and numbness and mid- lower back pain. He rates his pain 7. His current exercise regime is walking and performing stretching exercises.  [name] Morphine equivalent is 45.00 MME.    Last UDS was Performed on 01/19/2020, it was consistent.    Pain Inventory Average Pain 6 Pain Right Now 7 My pain is intermittent, sharp, burning, dull, stabbing, tingling and aching  In the last 24 hours, has pain interfered with the following? General activity 7 Relation with others 7 Enjoyment of life 7 What TIME of day is your pain at its worst? morning , evening and night Sleep (in general) Fair  Pain is worse with: walking, bending, sitting, inactivity, standing, unsure and some activites Pain improves with: rest, heat/ice and medication Relief from Meds: 6  Family History  Problem Relation Age of Onset  . Diabetes type II Mother   . Colon polyps Mother   . Diverticulitis Mother   . Diabetes Mother   . Heart disease Mother   . Osteoporosis Father   . Diabetes type II Brother   . Uterine cancer Other   . Ovarian cancer Other   . COPD Paternal Grandfather   . Heart attack Maternal Grandmother    Social History   Socioeconomic History  . Marital status: Married    Spouse name: Not on file  . Number of children: 2  . Years of education: Not on file  . Highest education level: Not on file  Occupational History  . Occupation: Disability    Employer: UNEMPLOYED  Tobacco Use  . Smoking status: Former Smoker    Types: Cigarettes    Quit date: 06/30/2011    Years since quitting: 8.8  . Smokeless tobacco: Never Used  Substance and Sexual Activity  .  Alcohol use: No    Alcohol/week: 0.0 standard drinks  . Drug use: No  . Sexual activity: Yes  Other Topics Concern  . Not on file  Social History Narrative  . Not on file   Social Determinants of Health   Financial Resource Strain:   . Difficulty of Paying Living Expenses: Not on file  Food Insecurity:   . Worried About Charity fundraiser in the Last Year: Not on file  . Ran Out of Food in the Last Year: Not on file  Transportation Needs:   . Lack of Transportation (Medical): Not on file  . Lack of Transportation (Non-Medical): Not on file  Physical Activity:   . Days of Exercise per Week: Not on file  . Minutes of Exercise per Session: Not on file  Stress:   . Feeling of Stress : Not on file  Social Connections:   . Frequency of Communication with Friends and Family: Not on file  . Frequency of Social Gatherings with Friends and Family: Not on file  . Attends Religious Services: Not on file  . Active Member of Clubs or Organizations: Not on file  . Attends Archivist Meetings: Not on file  . Marital Status: Not on file   Past Surgical History:  Procedure Laterality Date  . CESAREAN SECTION    .  Left elbow surgery    . PARTIAL HYSTERECTOMY    . TONSILLECTOMY     Past Surgical History:  Procedure Laterality Date  . CESAREAN SECTION    . Left elbow surgery    . PARTIAL HYSTERECTOMY    . TONSILLECTOMY     Past Medical History:  Diagnosis Date  . Anxiety   . Chronic pain    Dr. Letta Pate  . Degenerative disc disease    Spinal, small syrinx  . Depression   . Esophageal stricture    Status post dilatation  . Fibromyalgia   . GERD (gastroesophageal reflux disease)   . Hiatal hernia   . Internal hemorrhoids without mention of complication   . Irritable bowel syndrome   . Palpitations    No documented arrhythmias  . Personal history of colonic polyps 10/04/2000   HYPERPLASTIC POLYP  . POTS (postural orthostatic tachycardia syndrome)   . Syncope     Negative tilt table test  . 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  Opioid Risk Score:   Fall Risk Score:  `1  Depression screen PHQ 2/9  Depression screen [facility] 2/9 03/14/2020 11/23/2019 01/03/2019 11/09/2018 04/25/2018 09/20/2017 07/22/2017  Decreased Interest 1 1 1 1 1 1 1   Down, Depressed, Hopeless 1 1 1 1 1 1 1   PHQ - 2 Score 2 2 2 2 2 2 2   Altered sleeping - - - - - - -  Tired, decreased energy - - - - - - -  Change in appetite - - - - - - -  Feeling bad or failure about yourself  - - - - - - -  Trouble concentrating - - - - - - -  Moving slowly or fidgety/restless - - - - - - -  Suicidal thoughts - - - - - - -  PHQ-9 Score - - - - - - -  Some recent data might be hidden   Review of Systems  HENT: Negative.   Eyes: Negative.   Respiratory: Negative.   Cardiovascular: Negative.   Gastrointestinal: Negative.   Endocrine: Negative.   Genitourinary: Negative.   Musculoskeletal: Positive for neck pain.       Right shoulder Right arm  Right knee pain  Skin: Negative.   Allergic/Immunologic: Negative.   Neurological: Positive for headaches.  Hematological: Negative.   Psychiatric/Behavioral: Negative.   All other systems reviewed and are negative.      Objective:   Physical Exam Vitals and nursing note reviewed.  Constitutional:      Appearance: Normal appearance.  Neck:     Comments: Cervical Paraspinal Tenderness: C-5- C-6 Cardiovascular:     Rate and Rhythm: Normal rate and regular rhythm.     Pulses: Normal pulses.     Heart sounds: Normal heart sounds.  Pulmonary:     Effort: Pulmonary effort is normal.     Breath sounds: Normal breath sounds.  Musculoskeletal:     Cervical back: Normal range of motion and neck supple.     Comments: Normal Muscle Bulk and Muscle Testing Reveals:  Upper Extremities: Full ROM and Muscle Strength 5/5 Thoracic Paraspinal Tenderness: T-7-T-9 Lumbar Hypersensitivity Lower Extremities:  Full ROM and Muscle Strength 5/5 Arises from Table with ease  Narrow Based Gait   Skin:    General: Skin is warm and dry.  Neurological:     Mental Status: She is alert and oriented to person, place, and time.  Psychiatric:  Mood and Affect: Mood normal.        Behavior: Behavior normal.           Assessment & Plan:  1. Lumbar degenerative disc disease:05/15/2020. Continue with current treatment regimen:Refilled:HYDROcodone 7.5/325 mg two tablets every 8 hours as needed #180. We will continue the opioid monitoring program, this consists of regular clinic visits, examinations, urine drug screen, pill counts as well as use of [place] Controlled Substance Reporting system. A 12 month History has been reviewed on the [place] Controlled Substance Reporting System on 05/15/2020  2. Thoracic Spondylosis: Continuecurrent treatment withHEP and current medication regime.05/15/2020. 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 Lyrica discontinued due to adverse effects. Continue with exercise and heat therapy.05/15/2020 4. Fibromyalgia:Continue with Heat and exerciseRegimen.ContinueGabapentin.05/15/2020 5.Depression and anxiety: PCP Following.05/15/2020 6.Orthostatic hypotension: Cardiology Following.05/15/2020 7. Polyarthralgia: Continueto monitor.05/15/2020 8. ChronicBilateralShoulder Pain:No complaints today.Continue current medication regime. Continue HEP as Tolerated.10/13//2021. 9. Muscle Spasm:ContinueRobaxin.Continue to monitor.05/15/2020. 10. Neuropathic Pain:ContinueGabapentin.Continue to Monitor.05/15/2020 11. Right elbow Pain:No complaints today.Continue current medication regimen. Continue to monitor.05/15/2020 12.BilateralKnee with OAR>L: No complaints today. Continue HEP as Tolerated.Continue current medication  regimenwith Mobic. Continue to monitor.05/15/2020  F/U in 1 month

## 2020-05-23 DIAGNOSIS — R5383 Other fatigue: Secondary | ICD-10-CM | POA: Diagnosis not present

## 2020-05-23 DIAGNOSIS — K219 Gastro-esophageal reflux disease without esophagitis: Secondary | ICD-10-CM | POA: Diagnosis not present

## 2020-05-23 DIAGNOSIS — Z79891 Long term (current) use of opiate analgesic: Secondary | ICD-10-CM | POA: Diagnosis not present

## 2020-05-23 DIAGNOSIS — E063 Autoimmune thyroiditis: Secondary | ICD-10-CM | POA: Diagnosis not present

## 2020-05-23 DIAGNOSIS — H5319 Other subjective visual disturbances: Secondary | ICD-10-CM | POA: Diagnosis not present

## 2020-05-23 DIAGNOSIS — Z79899 Other long term (current) drug therapy: Secondary | ICD-10-CM | POA: Diagnosis not present

## 2020-05-23 DIAGNOSIS — E559 Vitamin D deficiency, unspecified: Secondary | ICD-10-CM | POA: Diagnosis not present

## 2020-05-23 DIAGNOSIS — Z6823 Body mass index (BMI) 23.0-23.9, adult: Secondary | ICD-10-CM | POA: Diagnosis not present

## 2020-06-01 DIAGNOSIS — I10 Essential (primary) hypertension: Secondary | ICD-10-CM | POA: Diagnosis not present

## 2020-06-17 ENCOUNTER — Encounter: Payer: Medicare Other | Attending: Physical Medicine & Rehabilitation | Admitting: Registered Nurse

## 2020-06-17 ENCOUNTER — Other Ambulatory Visit: Payer: Self-pay

## 2020-06-17 ENCOUNTER — Encounter: Payer: Self-pay | Admitting: Registered Nurse

## 2020-06-17 VITALS — BP 113/77 | HR 75 | Temp 98.0°F | Ht 66.0 in | Wt 147.6 lb

## 2020-06-17 DIAGNOSIS — Z79891 Long term (current) use of opiate analgesic: Secondary | ICD-10-CM

## 2020-06-17 DIAGNOSIS — G894 Chronic pain syndrome: Secondary | ICD-10-CM | POA: Diagnosis not present

## 2020-06-17 DIAGNOSIS — M25512 Pain in left shoulder: Secondary | ICD-10-CM

## 2020-06-17 DIAGNOSIS — M4716 Other spondylosis with myelopathy, lumbar region: Secondary | ICD-10-CM | POA: Diagnosis not present

## 2020-06-17 DIAGNOSIS — M542 Cervicalgia: Secondary | ICD-10-CM | POA: Diagnosis not present

## 2020-06-17 DIAGNOSIS — Z5181 Encounter for therapeutic drug level monitoring: Secondary | ICD-10-CM | POA: Diagnosis not present

## 2020-06-17 DIAGNOSIS — M47812 Spondylosis without myelopathy or radiculopathy, cervical region: Secondary | ICD-10-CM | POA: Diagnosis not present

## 2020-06-17 DIAGNOSIS — M25572 Pain in left ankle and joints of left foot: Secondary | ICD-10-CM | POA: Diagnosis not present

## 2020-06-17 DIAGNOSIS — M25511 Pain in right shoulder: Secondary | ICD-10-CM

## 2020-06-17 DIAGNOSIS — M797 Fibromyalgia: Secondary | ICD-10-CM | POA: Diagnosis not present

## 2020-06-17 DIAGNOSIS — G8929 Other chronic pain: Secondary | ICD-10-CM

## 2020-06-17 DIAGNOSIS — M2550 Pain in unspecified joint: Secondary | ICD-10-CM

## 2020-06-17 MED ORDER — HYDROCODONE-ACETAMINOPHEN 7.5-325 MG PO TABS: 2.0000 | ORAL_TABLET | Freq: Three times a day (TID) | ORAL | 0 refills | Status: DC | PRN

## 2020-06-17 NOTE — Progress Notes (Addendum)
Subjective:    Patient ID: [name], female    DOB: 1975/12/11, 44 y.o.   MRN: [id number]  HPI: [name] is a 44 y.o. female who returns for follow up appointment for chronic pain and medication refill. She states her pain is located in her neck, bilateral shoulders, lower back pain, she decribes her pain as deep pain. Also states she has left ankle pain for the last few days, she denies falling. We will continue to monitor left ankle pain, if no relief [name] will call office and X-ray will be ordered, she verbalizes understanding. Also reports generalized joint pain. She rates her pain 7. Her current exercise regime is walking and performing stretching exercises.  [name] equivalent is 40.50  MME. She  is also prescribed Clonazepam by Dr. Gerarda Fraction. We have discussed the black box warning of using opioids and benzodiazepines. I highlighted the dangers of using these drugs together and discussed the adverse events including respiratory suppression, overdose, cognitive impairment and importance of compliance with current regimen. We will continue to monitor and adjust as indicated.    UDS ordered today.    Pain Inventory Average Pain 7 Pain Right Now 7 My pain is intermittent, sharp, burning, dull, stabbing, tingling and aching  In the last 24 hours, has pain interfered with the following? General activity 7 Relation with others 7 Enjoyment of life 7 What TIME of day is your pain at its worst? night Sleep (in general) Fair  Pain is worse with: walking, bending, sitting, inactivity, standing and some activites Pain improves with: rest, heat/ice and medication Relief from Meds: 5  Family History  Problem Relation Age of Onset  . Diabetes type II Mother   . Colon polyps Mother   . Diverticulitis Mother   . Diabetes Mother   . Heart disease Mother   . Osteoporosis Father   . Diabetes type II Brother   . Uterine cancer Other   . Ovarian cancer Other   .  COPD Paternal Grandfather   . Heart attack Maternal Grandmother    Social History   Socioeconomic History  . Marital status: Married    Spouse name: Not on file  . Number of children: 2  . Years of education: Not on file  . Highest education level: Not on file  Occupational History  . Occupation: Disability    Employer: UNEMPLOYED  Tobacco Use  . Smoking status: Former Smoker    Types: Cigarettes    Quit date: 06/30/2011    Years since quitting: 8.9  . Smokeless tobacco: Never Used  Vaping Use  . Vaping Use: Former  . Substances: Nicotine, Flavoring  Substance and Sexual Activity  . Alcohol use: No    Alcohol/week: 0.0 standard drinks  . Drug use: No  . Sexual activity: Yes  Other Topics Concern  . Not on file  Social History Narrative  . Not on file   Social Determinants of Health   Financial Resource Strain:   . Difficulty of Paying Living Expenses: Not on file  Food Insecurity:   . Worried About Charity fundraiser in the Last Year: Not on file  . Ran Out of Food in the Last Year: Not on file  Transportation Needs:   . Lack of Transportation (Medical): Not on file  . Lack of Transportation (Non-Medical): Not on file  Physical Activity:   . Days of Exercise per Week: Not on file  . Minutes of Exercise per Session:  Not on file  Stress:   . Feeling of Stress : Not on file  Social Connections:   . Frequency of Communication with Friends and Family: Not on file  . Frequency of Social Gatherings with Friends and Family: Not on file  . Attends Religious Services: Not on file  . Active Member of Clubs or Organizations: Not on file  . Attends Archivist Meetings: Not on file  . Marital Status: Not on file   Past Surgical History:  Procedure Laterality Date  . CESAREAN SECTION    . Left elbow surgery    . PARTIAL HYSTERECTOMY    . TONSILLECTOMY     Past Surgical History:  Procedure Laterality Date  . CESAREAN SECTION    . Left elbow surgery    .  PARTIAL HYSTERECTOMY    . TONSILLECTOMY     Past Medical History:  Diagnosis Date  . Anxiety   . Chronic pain    Dr. Letta Pate  . Degenerative disc disease    Spinal, small syrinx  . Depression   . Esophageal stricture    Status post dilatation  . Fibromyalgia   . GERD (gastroesophageal reflux disease)   . Hiatal hernia   . Internal hemorrhoids without mention of complication   . Irritable bowel syndrome   . Palpitations    No documented arrhythmias  . Personal history of colonic polyps 10/04/2000   HYPERPLASTIC POLYP  . POTS (postural orthostatic tachycardia syndrome)   . Syncope    Negative tilt table test  . Syringomyelia and syringobulbia (HCC)    BP 113/77   Pulse 75   Temp 98 F (36.7 C)   Ht 5' 6"  (1.676 m)   Wt 147 lb 9.6 oz (67 kg)   SpO2 99%   BMI 23.82 kg/m   Opioid Risk Score:   Fall Risk Score:  `1  Depression screen PHQ 2/9  Depression screen [facility] 2/9 05/15/2020 03/14/2020 11/23/2019 01/03/2019 11/09/2018 04/25/2018 09/20/2017  Decreased Interest 0 1 1 1 1 1 1   Down, Depressed, Hopeless 0 1 1 1 1 1 1   PHQ - 2 Score 0 2 2 2 2 2 2   Altered sleeping - - - - - - -  Tired, decreased energy - - - - - - -  Change in appetite - - - - - - -  Feeling bad or failure about yourself  - - - - - - -  Trouble concentrating - - - - - - -  Moving slowly or fidgety/restless - - - - - - -  Suicidal thoughts - - - - - - -  PHQ-9 Score - - - - - - -  Some recent data might be hidden     Review of Systems     Objective:   Physical Exam Vitals and nursing note reviewed.  Constitutional:      Appearance: Normal appearance.  Cardiovascular:     Rate and Rhythm: Normal rate and regular rhythm.     Pulses: Normal pulses.     Heart sounds: Normal heart sounds.  Pulmonary:     Effort: Pulmonary effort is normal.     Breath sounds: Normal breath sounds.  Musculoskeletal:     Cervical back: Normal range of motion and neck supple.     Comments: Normal Muscle Bulk and  Muscle Testing Reveals:  Upper Extremities: Full  ROM and Muscle Strength 5/5  Lower Extremities: Full ROM and Muscle Strength 5/5 Arises from Table with  ease Narrow Based  Gait   Skin:    General: Skin is warm and dry.  Neurological:     Mental Status: She is oriented to person, place, and time.  Psychiatric:        Mood and Affect: Mood normal.        Behavior: Behavior normal.           Assessment & Plan:  1. Lumbar degenerative disc disease:06/17/2020. Continue with current treatment regimen:Refilled:HYDROcodone 7.5/325 mg two tablets every 8 hours as needed #180. We will continue the opioid monitoring program, this consists of regular clinic visits, examinations, urine drug screen, pill counts as well as use of [place] Controlled Substance Reporting system. A 12 month History has been reviewed on the [place] Controlled Substance Reporting System on 06/17/2020  2. Thoracic Spondylosis: Continuecurrent treatment withHEP and current medication regime.06/17/2020. 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 Lyrica discontinued due to adverse effects. Continue with exercise and heat therapy.06/17/2020 4. Fibromyalgia:Continue with Heat and exerciseRegimen.ContinueGabapentin.06/17/2020 5.Depression and anxiety: PCP Following.06/17/2020 6.Orthostatic hypotension: Cardiology Following.06/17/2020 7. Polyarthralgia: Continueto monitor.06/17/2020 8. ChronicBilateralShoulder Pain:No complaints today.Continue current medication regime. Continue HEP as Tolerated.11/15//2021. 9. Muscle Spasm:ContinueRobaxin.Continue to monitor.06/17/2020. 10. Neuropathic Pain:ContinueGabapentin.Continue to Monitor.06/17/2020 11. Right elbow Pain:No complaints today.Continue current medication regimen. Continue to monitor.06/17/2020 12.BilateralKnee with  OAR>L: No complaints today. Continue HEP as Tolerated.Continue current medication regimenwith Mobic. Continue to monitor.06/17/2020 13. Left ankle Pain: Denies Falling. Will continue to Monitor. Ms. Cheaney will call office if pain persists and X-ray will be ordered, she verbalizes understanding.  Continue to monitor.  F/U in 1 month  20  minutes of face to face patient care time was spent during this visit. All questions were encouraged and answered.

## 2020-06-23 LAB — TOXASSURE SELECT,+ANTIDEPR,UR

## 2020-07-02 DIAGNOSIS — I10 Essential (primary) hypertension: Secondary | ICD-10-CM | POA: Diagnosis not present

## 2020-07-05 DIAGNOSIS — J0181 Other acute recurrent sinusitis: Secondary | ICD-10-CM | POA: Diagnosis not present

## 2020-07-05 DIAGNOSIS — K219 Gastro-esophageal reflux disease without esophagitis: Secondary | ICD-10-CM | POA: Diagnosis not present

## 2020-07-05 DIAGNOSIS — E063 Autoimmune thyroiditis: Secondary | ICD-10-CM | POA: Diagnosis not present

## 2020-07-08 ENCOUNTER — Telehealth: Payer: Self-pay | Admitting: *Deleted

## 2020-07-08 NOTE — Telephone Encounter (Signed)
Urine drug screen for this encounter is consistent for prescribed medication 

## 2020-07-12 ENCOUNTER — Telehealth: Payer: Self-pay | Admitting: Registered Nurse

## 2020-07-12 NOTE — Telephone Encounter (Signed)
This provider spoke with [place]. Ms. [facility] appointment will be changed to My-Chart Video, since her husband is on quarantine.

## 2020-07-12 NOTE — Telephone Encounter (Signed)
Received phone call from [facility] . Per patient she had COVID 54 and she is better now but her husband got also COVID one week after her . Please advise me if we can change to my chart visit her visit on Monday 07/15/2020 . Thank you so much!

## 2020-07-15 ENCOUNTER — Encounter: Payer: Medicare Other | Attending: Physical Medicine & Rehabilitation | Admitting: Registered Nurse

## 2020-07-15 ENCOUNTER — Other Ambulatory Visit: Payer: Self-pay

## 2020-07-15 ENCOUNTER — Encounter: Payer: Self-pay | Admitting: Registered Nurse

## 2020-07-15 VITALS — BP 119/64 | Ht 66.0 in | Wt 145.0 lb

## 2020-07-15 DIAGNOSIS — Z79891 Long term (current) use of opiate analgesic: Secondary | ICD-10-CM

## 2020-07-15 DIAGNOSIS — Z5181 Encounter for therapeutic drug level monitoring: Secondary | ICD-10-CM

## 2020-07-15 DIAGNOSIS — M25512 Pain in left shoulder: Secondary | ICD-10-CM

## 2020-07-15 DIAGNOSIS — M4716 Other spondylosis with myelopathy, lumbar region: Secondary | ICD-10-CM

## 2020-07-15 DIAGNOSIS — M25511 Pain in right shoulder: Secondary | ICD-10-CM | POA: Diagnosis not present

## 2020-07-15 DIAGNOSIS — M542 Cervicalgia: Secondary | ICD-10-CM

## 2020-07-15 DIAGNOSIS — M797 Fibromyalgia: Secondary | ICD-10-CM

## 2020-07-15 DIAGNOSIS — M47812 Spondylosis without myelopathy or radiculopathy, cervical region: Secondary | ICD-10-CM

## 2020-07-15 DIAGNOSIS — G8929 Other chronic pain: Secondary | ICD-10-CM

## 2020-07-15 DIAGNOSIS — M2550 Pain in unspecified joint: Secondary | ICD-10-CM

## 2020-07-15 DIAGNOSIS — G894 Chronic pain syndrome: Secondary | ICD-10-CM

## 2020-07-15 MED ORDER — HYDROCODONE-ACETAMINOPHEN 7.5-325 MG PO TABS: 2.0000 | ORAL_TABLET | Freq: Three times a day (TID) | ORAL | 0 refills | Status: DC | PRN

## 2020-07-15 NOTE — Progress Notes (Signed)
Subjective:    Patient ID: [name], female    DOB: 1976-04-08, 45 y.o.   MRN: [id number]  HPI: [name] is a 44 y.o. female whose appointment was changed to My-Chart Video Visit. [name] reports, she, her husband, sons, daughter- in law , grand- baby was diagnosed with COVID. Also other family member, her husband is still on quarantine her appointment was changed to My-Chart Video visit. She agrees with My-Chart Video visit and verbalizes understanding. She states her pain is located in her neck, right shoulder, right elbow and lower back pain. She  rates her pain 7. Her current exercise regime is walking in her home.  [name] Morphine equivalent is 45.00 MME. She  is also prescribed Clonazepam by Dr. Gerarda Fraction. We have discussed the black box warning of using opioids and benzodiazepines. I highlighted the dangers of using these drugs together and discussed the adverse events including respiratory suppression, overdose, cognitive impairment and importance of compliance with current regimen. We will continue to monitor and adjust as indicated.    Last UDS 06/17/2020, it was consistent.    Pain Inventory Average Pain 6 Pain Right Now 7 My pain is intermittent, sharp, burning, dull, stabbing, tingling and aching  In the last 24 hours, has pain interfered with the following? General activity 8 Relation with others 7 Enjoyment of life 7 What TIME of day is your pain at its worst? morning , evening and night Sleep (in general) Fair  Pain is worse with: walking, bending, sitting, inactivity, standing and some activites Pain improves with: rest, heat/ice and medication Relief from Meds: 6  Family History  Problem Relation Age of Onset  . Diabetes type II Mother   . Colon polyps Mother   . Diverticulitis Mother   . Diabetes Mother   . Heart disease Mother   . Osteoporosis Father   . Diabetes type II Brother   . Uterine cancer Other   . Ovarian cancer Other   . COPD  Paternal Grandfather   . Heart attack Maternal Grandmother    Social History   Socioeconomic History  . Marital status: Married    Spouse name: Not on file  . Number of children: 2  . Years of education: Not on file  . Highest education level: Not on file  Occupational History  . Occupation: Disability    Employer: UNEMPLOYED  Tobacco Use  . Smoking status: Former Smoker    Types: Cigarettes    Quit date: 06/30/2011    Years since quitting: 9.0  . Smokeless tobacco: Never Used  Vaping Use  . Vaping Use: Former  . Substances: Nicotine, Flavoring  Substance and Sexual Activity  . Alcohol use: No    Alcohol/week: 0.0 standard drinks  . Drug use: No  . Sexual activity: Yes  Other Topics Concern  . Not on file  Social History Narrative  . Not on file   Social Determinants of Health   Financial Resource Strain: Not on file  Food Insecurity: Not on file  Transportation Needs: Not on file  Physical Activity: Not on file  Stress: Not on file  Social Connections: Not on file   Past Surgical History:  Procedure Laterality Date  . CESAREAN SECTION    . Left elbow surgery    . PARTIAL HYSTERECTOMY    . TONSILLECTOMY     Past Surgical History:  Procedure Laterality Date  . CESAREAN SECTION    . Left elbow surgery    .  PARTIAL HYSTERECTOMY    . TONSILLECTOMY     Past Medical History:  Diagnosis Date  . Anxiety   . Chronic pain    Dr. Letta Pate  . Degenerative disc disease    Spinal, small syrinx  . Depression   . Esophageal stricture    Status post dilatation  . Fibromyalgia   . GERD (gastroesophageal reflux disease)   . Hiatal hernia   . Internal hemorrhoids without mention of complication   . Irritable bowel syndrome   . Palpitations    No documented arrhythmias  . Personal history of colonic polyps 10/04/2000   HYPERPLASTIC POLYP  . POTS (postural orthostatic tachycardia syndrome)   . Syncope    Negative tilt table test  . Syringomyelia and  syringobulbia (HCC)    BP 119/64 Comment: per pt - video visit  Ht 5' 6"  (1.676 m)   Wt 145 lb (65.8 kg) Comment: per pt - video visit  BMI 23.40 kg/m   Opioid Risk Score:   Fall Risk Score:  `1  Depression screen PHQ 2/9  Depression screen [facility] 2/9 05/15/2020 03/14/2020 11/23/2019 01/03/2019 11/09/2018 04/25/2018 09/20/2017  Decreased Interest 0 1 1 1 1 1 1   Down, Depressed, Hopeless 0 1 1 1 1 1 1   PHQ - 2 Score 0 2 2 2 2 2 2   Altered sleeping - - - - - - -  Tired, decreased energy - - - - - - -  Change in appetite - - - - - - -  Feeling bad or failure about yourself  - - - - - - -  Trouble concentrating - - - - - - -  Moving slowly or fidgety/restless - - - - - - -  Suicidal thoughts - - - - - - -  PHQ-9 Score - - - - - - -  Some recent data might be hidden   Review of Systems  HENT: Positive for congestion.   Musculoskeletal: Positive for arthralgias, gait problem and neck pain. Negative for back pain.  All other systems reviewed and are negative.      Objective:   Physical Exam Vitals and nursing note reviewed.  Musculoskeletal:     Comments: No Physical Exam Performed: My-Chart Video Visit           Assessment & Plan:  1. Lumbar degenerative disc disease:07/15/2020. Continue with current treatment regimen:Refilled:HYDROcodone 7.5/325 mg two tablets every 8 hours as needed #180. We will continue the opioid monitoring program, this consists of regular clinic visits, examinations, urine drug screen, pill counts as well as use of [place] Controlled Substance Reporting system. A 12 month History has been reviewed on the [place] Controlled Substance Reporting Systemon 07/15/2020 2. Thoracic Spondylosis: Continuecurrent treatment withHEP and current medication regime.07/15/2020. 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 Lyrica discontinued due to  adverse effects. Continue with exercise and heat therapy.07/15/2020 4. Fibromyalgia:Continue with Heat and exerciseRegimen.ContinueGabapentin.07/15/2020 5.Depression and anxiety: PCP Following.07/15/2020 6.Orthostatic hypotension: Cardiology Following.07/15/2020 7. Polyarthralgia: Continueto monitor.07/15/2020 8. ChronicBilateralShoulder Pain:No complaints today.Continue current medication regime. Continue HEP as Tolerated.12/13//2021. 9. Muscle Spasm:ContinueRobaxin.Continue to monitor.07/15/2020. 10. Neuropathic Pain:ContinueGabapentin.Continue to Monitor.07/15/2020 11. Right elbow Pain:Continue current medication regimen. Continue to monitor.07/15/2020 12.BilateralKnee with OAR>L: No complaints today.Continue HEP as Tolerated.Continue current medication regimenwith Mobic. Continue to monitor.07/15/2020  F/U in 1 month  My Chart Video Visit Established Patient Location of Patient in her Home Location of Provider: In the Office

## 2020-07-24 DIAGNOSIS — U071 COVID-19: Secondary | ICD-10-CM | POA: Diagnosis not present

## 2020-08-02 DIAGNOSIS — I10 Essential (primary) hypertension: Secondary | ICD-10-CM | POA: Diagnosis not present

## 2020-08-16 ENCOUNTER — Other Ambulatory Visit: Payer: Self-pay

## 2020-08-16 ENCOUNTER — Encounter: Payer: Self-pay | Admitting: Registered Nurse

## 2020-08-16 ENCOUNTER — Encounter: Payer: Medicare Other | Attending: Physical Medicine & Rehabilitation | Admitting: Registered Nurse

## 2020-08-16 VITALS — BP 112/73 | HR 96 | Ht 67.0 in | Wt 146.0 lb

## 2020-08-16 DIAGNOSIS — Z5181 Encounter for therapeutic drug level monitoring: Secondary | ICD-10-CM

## 2020-08-16 DIAGNOSIS — Z79891 Long term (current) use of opiate analgesic: Secondary | ICD-10-CM | POA: Diagnosis not present

## 2020-08-16 DIAGNOSIS — M25512 Pain in left shoulder: Secondary | ICD-10-CM | POA: Diagnosis not present

## 2020-08-16 DIAGNOSIS — M797 Fibromyalgia: Secondary | ICD-10-CM | POA: Diagnosis not present

## 2020-08-16 DIAGNOSIS — M792 Neuralgia and neuritis, unspecified: Secondary | ICD-10-CM

## 2020-08-16 DIAGNOSIS — M2550 Pain in unspecified joint: Secondary | ICD-10-CM | POA: Diagnosis not present

## 2020-08-16 DIAGNOSIS — G5793 Unspecified mononeuropathy of bilateral lower limbs: Secondary | ICD-10-CM

## 2020-08-16 DIAGNOSIS — M25511 Pain in right shoulder: Secondary | ICD-10-CM | POA: Insufficient documentation

## 2020-08-16 DIAGNOSIS — M4716 Other spondylosis with myelopathy, lumbar region: Secondary | ICD-10-CM | POA: Insufficient documentation

## 2020-08-16 DIAGNOSIS — G894 Chronic pain syndrome: Secondary | ICD-10-CM | POA: Diagnosis not present

## 2020-08-16 DIAGNOSIS — M47812 Spondylosis without myelopathy or radiculopathy, cervical region: Secondary | ICD-10-CM | POA: Insufficient documentation

## 2020-08-16 DIAGNOSIS — M542 Cervicalgia: Secondary | ICD-10-CM | POA: Diagnosis not present

## 2020-08-16 DIAGNOSIS — G8929 Other chronic pain: Secondary | ICD-10-CM

## 2020-08-16 MED ORDER — HYDROCODONE-ACETAMINOPHEN 7.5-325 MG PO TABS: 2.0000 | ORAL_TABLET | Freq: Three times a day (TID) | ORAL | 0 refills | Status: DC | PRN

## 2020-08-16 MED ORDER — GABAPENTIN 100 MG PO CAPS: ORAL_CAPSULE | ORAL | 3 refills | Status: DC

## 2020-08-16 NOTE — Progress Notes (Signed)
Subjective:    Patient ID: [name], female    DOB: 1976-07-29, 45 y.o.   MRN: [id number]  HPI: [name] is a 45 y.o. female whose appointment was changed to a My-Cart Video visit, she called office with complaints of sore throat and coughing. She agrees with My-Chart Video Visit and verbalizes understanding. She was encouraged to call her PCP regarding the above, she verbalizes understanding. She states her pain is located in her neck, bilateral shoulder pain, and lower back pain. Also reports tingling and burning in her right hand and bilateral feet with tingling and burning. She rates her pain 7. Her current exercise regime is walking and performing stretching exercises.  [name] Morphine equivalent is 42.00  MME.  She is also prescribed Clonazepam by Dr. Gerarda Fraction. We have discussed the black box warning of using opioids and benzodiazepines. I highlighted the dangers of using these drugs together and discussed the adverse events including respiratory suppression, overdose, cognitive impairment and importance of compliance with current regimen. We will continue to monitor and adjust as indicated.   Last UDS was Performed on 06/17/2020, it was consistent.   [name] CMA asked The Health and History Questions. This provider and [name] verified we were speaking with the correct person using two identifiers.    Pain Inventory Average Pain 7 Pain Right Now 7 My pain is intermittent, constant, sharp, burning, dull, stabbing, tingling and aching  In the last 24 hours, has pain interfered with the following? General activity 10 Relation with others 10 Enjoyment of life 10 What TIME of day is your pain at its worst? morning  and night Sleep (in general) Fair  Pain is worse with: walking, bending, sitting, inactivity, standing and some activites Pain improves with: heat/ice and medication Relief from Meds: 6  Family History  Problem Relation Age of Onset  .  Diabetes type II Mother   . Colon polyps Mother   . Diverticulitis Mother   . Diabetes Mother   . Heart disease Mother   . Osteoporosis Father   . Diabetes type II Brother   . Uterine cancer Other   . Ovarian cancer Other   . COPD Paternal Grandfather   . Heart attack Maternal Grandmother    Social History   Socioeconomic History  . Marital status: Married    Spouse name: Not on file  . Number of children: 2  . Years of education: Not on file  . Highest education level: Not on file  Occupational History  . Occupation: Disability    Employer: UNEMPLOYED  Tobacco Use  . Smoking status: Former Smoker    Types: Cigarettes    Quit date: 06/30/2011    Years since quitting: 9.1  . Smokeless tobacco: Never Used  Vaping Use  . Vaping Use: Former  . Substances: Nicotine, Flavoring  Substance and Sexual Activity  . Alcohol use: No    Alcohol/week: 0.0 standard drinks  . Drug use: No  . Sexual activity: Yes  Other Topics Concern  . Not on file  Social History Narrative  . Not on file   Social Determinants of Health   Financial Resource Strain: Not on file  Food Insecurity: Not on file  Transportation Needs: Not on file  Physical Activity: Not on file  Stress: Not on file  Social Connections: Not on file   Past Surgical History:  Procedure Laterality Date  . CESAREAN SECTION    . Left elbow surgery    .  PARTIAL HYSTERECTOMY    . TONSILLECTOMY     Past Surgical History:  Procedure Laterality Date  . CESAREAN SECTION    . Left elbow surgery    . PARTIAL HYSTERECTOMY    . TONSILLECTOMY     Past Medical History:  Diagnosis Date  . Anxiety   . Chronic pain    Dr. Letta Pate  . Degenerative disc disease    Spinal, small syrinx  . Depression   . Esophageal stricture    Status post dilatation  . Fibromyalgia   . GERD (gastroesophageal reflux disease)   . Hiatal hernia   . Internal hemorrhoids without mention of complication   . Irritable bowel syndrome   .  Palpitations    No documented arrhythmias  . Personal history of colonic polyps 10/04/2000   HYPERPLASTIC POLYP  . POTS (postural orthostatic tachycardia syndrome)   . Syncope    Negative tilt table test  . Syringomyelia and syringobulbia (HCC)    BP 112/73 Comment: 112/73  Pulse (!) 117   Ht 5' 7"  (1.702 m)   Wt 146 lb (66.2 kg)   BMI 22.87 kg/m   Opioid Risk Score:   Fall Risk Score:  `1  Depression screen PHQ 2/9  Depression screen [name] 2/9 07/15/2020 05/15/2020 03/14/2020 11/23/2019 01/03/2019 11/09/2018 04/25/2018  Decreased Interest 1 0 1 1 1 1 1   Down, Depressed, Hopeless 1 0 1 1 1 1 1   PHQ - 2 Score 2 0 2 2 2 2 2   Altered sleeping - - - - - - -  Tired, decreased energy - - - - - - -  Change in appetite - - - - - - -  Feeling bad or failure about yourself  - - - - - - -  Trouble concentrating - - - - - - -  Moving slowly or fidgety/restless - - - - - - -  Suicidal thoughts - - - - - - -  PHQ-9 Score - - - - - - -  Some recent data might be hidden    Review of Systems  HENT: Negative.   Eyes: Negative.   Respiratory: Negative.   Cardiovascular: Negative.   Gastrointestinal: Negative.   Endocrine: Negative.   Genitourinary: Negative.   Musculoskeletal: Positive for arthralgias, back pain and neck pain.  Skin: Negative.   Allergic/Immunologic: Negative.   Neurological: Positive for weakness and numbness.       Tingling  Hematological: Negative.   Psychiatric/Behavioral: Negative.        Objective:   Physical Exam Vitals and nursing note reviewed.  Musculoskeletal:     Comments: No Physical Exam Performed: My Chart Video Visit           Assessment & Plan:  1. Lumbar degenerative disc disease:08/16/2020. Continue with current treatment regimen:Refilled:HYDROcodone 7.5/325 mg two tablets every 8 hours as needed #180. We will continue the opioid monitoring program, this consists of regular clinic visits, examinations, urine drug screen, pill counts as  well as use of [place] Controlled Substance Reporting system. A 12 month History has been reviewed on the [place] Controlled Substance Reporting Systemon 07/15/2020 2. Thoracic Spondylosis: Continuecurrent treatment withHEP and current medication regime.08/16/2020. 3 Cervicalgia/ Cervical Radiculitis/ Cervical spondylosis without evidence of myelopathy:ContinueGabapentin.Continue to Monitor.01/114/2022. Lyrica discontinued due to adverse effects. Continue with exercise and heat therapy.08/16/2020 4. Fibromyalgia:Continue with Heat and exerciseRegimen.ContinueGabapentin.08/16/2020 5.Depression and anxiety: PCP Following.08/16/2020 6.Orthostatic hypotension: Cardiology Following.08/16/2020 7. Polyarthralgia: Continueto monitor.08/16/2020 8. ChronicBilateralShoulder Pain:.Continue current medication regime. Continue HEP  as Tolerated.01/14//2022. 9. Muscle Spasm:ContinueRobaxin.Continue to monitor.08/16/2020. 10. Neuropathic Pain:ContinueGabapentin.Continue to Monitor.08/16/2020 11. Right elbow Pain:No complaints today. Continue current medication regimen. Continue to monitor.08/16/2020 12.BilateralKnee with OAR>L: No complaints today.Continue HEP as Tolerated.Continue current medication regimenwith Mobic. Continue to monitor.08/16/2020  F/U in 1 month  My Chart Video Visit Established Patient Location of Patient in her Home Location of Provider: In the Office

## 2020-08-31 DIAGNOSIS — I10 Essential (primary) hypertension: Secondary | ICD-10-CM | POA: Diagnosis not present

## 2020-08-31 DIAGNOSIS — G894 Chronic pain syndrome: Secondary | ICD-10-CM | POA: Diagnosis not present

## 2020-09-09 ENCOUNTER — Encounter: Payer: Medicare Other | Attending: Physical Medicine & Rehabilitation | Admitting: Registered Nurse

## 2020-09-09 ENCOUNTER — Other Ambulatory Visit: Payer: Self-pay

## 2020-09-09 ENCOUNTER — Encounter: Payer: Self-pay | Admitting: Registered Nurse

## 2020-09-09 VITALS — BP 116/76 | HR 90 | Temp 98.1°F | Ht 67.0 in | Wt 153.0 lb

## 2020-09-09 DIAGNOSIS — G8929 Other chronic pain: Secondary | ICD-10-CM | POA: Insufficient documentation

## 2020-09-09 DIAGNOSIS — G894 Chronic pain syndrome: Secondary | ICD-10-CM | POA: Insufficient documentation

## 2020-09-09 DIAGNOSIS — M25512 Pain in left shoulder: Secondary | ICD-10-CM

## 2020-09-09 DIAGNOSIS — M542 Cervicalgia: Secondary | ICD-10-CM | POA: Diagnosis not present

## 2020-09-09 DIAGNOSIS — M47812 Spondylosis without myelopathy or radiculopathy, cervical region: Secondary | ICD-10-CM | POA: Diagnosis not present

## 2020-09-09 DIAGNOSIS — M792 Neuralgia and neuritis, unspecified: Secondary | ICD-10-CM | POA: Insufficient documentation

## 2020-09-09 DIAGNOSIS — M797 Fibromyalgia: Secondary | ICD-10-CM | POA: Diagnosis not present

## 2020-09-09 DIAGNOSIS — Z5181 Encounter for therapeutic drug level monitoring: Secondary | ICD-10-CM | POA: Diagnosis not present

## 2020-09-09 DIAGNOSIS — M4716 Other spondylosis with myelopathy, lumbar region: Secondary | ICD-10-CM | POA: Insufficient documentation

## 2020-09-09 DIAGNOSIS — M25561 Pain in right knee: Secondary | ICD-10-CM

## 2020-09-09 DIAGNOSIS — G5793 Unspecified mononeuropathy of bilateral lower limbs: Secondary | ICD-10-CM | POA: Diagnosis not present

## 2020-09-09 DIAGNOSIS — Z79891 Long term (current) use of opiate analgesic: Secondary | ICD-10-CM | POA: Diagnosis not present

## 2020-09-09 DIAGNOSIS — M25511 Pain in right shoulder: Secondary | ICD-10-CM | POA: Diagnosis not present

## 2020-09-09 DIAGNOSIS — M47814 Spondylosis without myelopathy or radiculopathy, thoracic region: Secondary | ICD-10-CM | POA: Insufficient documentation

## 2020-09-09 DIAGNOSIS — M5412 Radiculopathy, cervical region: Secondary | ICD-10-CM

## 2020-09-09 DIAGNOSIS — M2550 Pain in unspecified joint: Secondary | ICD-10-CM | POA: Diagnosis not present

## 2020-09-09 DIAGNOSIS — M25521 Pain in right elbow: Secondary | ICD-10-CM | POA: Diagnosis not present

## 2020-09-09 MED ORDER — HYDROCODONE-ACETAMINOPHEN 7.5-325 MG PO TABS: 2.0000 | ORAL_TABLET | Freq: Three times a day (TID) | ORAL | 0 refills | Status: DC | PRN

## 2020-09-09 NOTE — Progress Notes (Signed)
Subjective:    Patient ID: [name], female    DOB: 1976/01/12, 45 y.o.   MRN: [id number]  HPI: [name] is a 45 y.o. female who returns for follow up appointment for chronic pain and medication refill. She states her pain is located in her neck radiating into her bilateral shoulders,right elbow pain and  mid- lower back pain.Also reports bilateral hand and bilateral feet pain with tingling and numbness and generalized joint pain. She rates her pain 7. Her current exercise regime is walking and performing stretching exercises.  [name] Morphine equivalent is 45.00  MME. She  is also prescribed Clonazepam by Dr.Fusco .We have discussed the black box warning of using opioids and benzodiazepines. I highlighted the dangers of using these drugs together and discussed the adverse events including respiratory suppression, overdose, cognitive impairment and importance of compliance with current regimen. We will continue to monitor and adjust as indicated.     Last UDS was Performed on 06/17/2020, it was consistent.   Pain Inventory Average Pain 7 Pain Right Now 7 My pain is intermittent, constant, sharp, burning, dull, stabbing, tingling and aching  In the last 24 hours, has pain interfered with the following? General activity 10 Relation with others 10 Enjoyment of life 10 What TIME of day is your pain at its worst? morning  and night Sleep (in general) Fair  Pain is worse with: walking, bending, sitting, inactivity, standing, unsure and some activites Pain improves with: heat/ice and medication Relief from Meds: 6  Family History  Problem Relation Age of Onset  . Diabetes type II Mother   . Colon polyps Mother   . Diverticulitis Mother   . Diabetes Mother   . Heart disease Mother   . Osteoporosis Father   . Diabetes type II Brother   . Uterine cancer Other   . Ovarian cancer Other   . COPD Paternal Grandfather   . Heart attack Maternal Grandmother    Social  History   Socioeconomic History  . Marital status: Married    Spouse name: Not on file  . Number of children: 2  . Years of education: Not on file  . Highest education level: Not on file  Occupational History  . Occupation: Disability    Employer: UNEMPLOYED  Tobacco Use  . Smoking status: Former Smoker    Types: Cigarettes    Quit date: 06/30/2011    Years since quitting: 9.2  . Smokeless tobacco: Never Used  Vaping Use  . Vaping Use: Former  . Substances: Nicotine, Flavoring  Substance and Sexual Activity  . Alcohol use: No    Alcohol/week: 0.0 standard drinks  . Drug use: No  . Sexual activity: Yes  Other Topics Concern  . Not on file  Social History Narrative  . Not on file   Social Determinants of Health   Financial Resource Strain: Not on file  Food Insecurity: Not on file  Transportation Needs: Not on file  Physical Activity: Not on file  Stress: Not on file  Social Connections: Not on file   Past Surgical History:  Procedure Laterality Date  . CESAREAN SECTION    . Left elbow surgery    . PARTIAL HYSTERECTOMY    . TONSILLECTOMY     Past Surgical History:  Procedure Laterality Date  . CESAREAN SECTION    . Left elbow surgery    . PARTIAL HYSTERECTOMY    . TONSILLECTOMY     Past Medical History:  Diagnosis  Date  . Anxiety   . Chronic pain    Dr. Letta Pate  . Degenerative disc disease    Spinal, small syrinx  . Depression   . Esophageal stricture    Status post dilatation  . Fibromyalgia   . GERD (gastroesophageal reflux disease)   . Hiatal hernia   . Internal hemorrhoids without mention of complication   . Irritable bowel syndrome   . Palpitations    No documented arrhythmias  . Personal history of colonic polyps 10/04/2000   HYPERPLASTIC POLYP  . POTS (postural orthostatic tachycardia syndrome)   . Syncope    Negative tilt table test  . Syringomyelia and syringobulbia (HCC)    BP 116/76   Pulse 90   Temp 98.1 F (36.7 C)   Ht 5' 7"   (1.702 m)   Wt 153 lb (69.4 kg)   SpO2 97%   BMI 23.96 kg/m   Opioid Risk Score:   Fall Risk Score:  `1  Depression screen PHQ 2/9  Depression screen [facility] 2/9 07/15/2020 05/15/2020 03/14/2020 11/23/2019 01/03/2019 11/09/2018 04/25/2018  Decreased Interest 1 0 1 1 1 1 1   Down, Depressed, Hopeless 1 0 1 1 1 1 1   PHQ - 2 Score 2 0 2 2 2 2 2   Altered sleeping - - - - - - -  Tired, decreased energy - - - - - - -  Change in appetite - - - - - - -  Feeling bad or failure about yourself  - - - - - - -  Trouble concentrating - - - - - - -  Moving slowly or fidgety/restless - - - - - - -  Suicidal thoughts - - - - - - -  PHQ-9 Score - - - - - - -  Some recent data might be hidden    Review of Systems  Musculoskeletal: Positive for arthralgias, back pain and neck pain.  Neurological: Positive for weakness and numbness.       Tingling  All other systems reviewed and are negative.      Objective:   Physical Exam Vitals and nursing note reviewed.  Constitutional:      Appearance: Normal appearance.  Neck:     Comments: Cervical Paraspinal Tenderness: C-5-C-6 Cardiovascular:     Rate and Rhythm: Normal rate and regular rhythm.     Pulses: Normal pulses.     Heart sounds: Normal heart sounds.  Pulmonary:     Effort: Pulmonary effort is normal.     Breath sounds: Normal breath sounds.  Musculoskeletal:     Cervical back: Normal range of motion and neck supple.     Comments: Normal Muscle Bulk and Muscle Testing Reveals:  Upper Extremities: Full ROM and Muscle Strength 5/5 Bilateral AC Joint Tenderness  Thoracic Paraspinal Tenderness: T-7-T-9 Lumbar Paraspinal Tenderness: L-3-L-5 Lower Extremities: Full ROM and Muscle Strength 5/5 Arises from chair with ease Narrow Based Gait   Skin:    General: Skin is warm and dry.  Neurological:     Mental Status: She is alert and oriented to person, place, and time.  Psychiatric:        Mood and Affect: Mood normal.        Behavior: Behavior  normal.           Assessment & Plan:  1. Lumbar degenerative disc disease:09/09/2020. Continue with current treatment regimen:Refilled:HYDROcodone 7.5/325 mg two tablets every 8 hours as needed #180. We will continue the opioid monitoring program, this consists of  regular clinic visits, examinations, urine drug screen, pill counts as well as use of [place] Controlled Substance Reporting system. A 12 month History has been reviewed on the [place] Controlled Substance Reporting Systemon 09/09/2020 2. Thoracic Spondylosis: Continuecurrent treatment withHEP and current medication regime.09/09/2020. 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 Lyrica discontinued due to adverse effects. Continue with exercise and heat therapy.09/09/2020 4. Fibromyalgia:Continue with Heat and exerciseRegimen.ContinueGabapentin.09/09/2020 5.Depression and anxiety: PCP Following.09/09/2020 6.Orthostatic hypotension: Cardiology Following.09/09/2020 7. Polyarthralgia: Continueto monitor.09/09/2020 8. ChronicBilateralShoulder Pain:.Continue current medication regime. Continue HEP as Tolerated.02/07//2022. 9. Muscle Spasm:ContinueRobaxin.Continue to monitor.09/09/2020. 10. Neuropathic Pain:ContinueGabapentin.Continue to Monitor.09/09/2020 11. Right elbow Pain:Continue current medication regimen. Continue to monitor.09/09/2020 12.Right Knee with OA: Continue HEP as Tolerated.Continue current medication regimenwith Mobic. Continue to monitor.09/09/2020  F/U in 1 month

## 2020-09-26 DIAGNOSIS — E559 Vitamin D deficiency, unspecified: Secondary | ICD-10-CM | POA: Diagnosis not present

## 2020-09-26 DIAGNOSIS — Z1389 Encounter for screening for other disorder: Secondary | ICD-10-CM | POA: Diagnosis not present

## 2020-09-26 DIAGNOSIS — E782 Mixed hyperlipidemia: Secondary | ICD-10-CM | POA: Diagnosis not present

## 2020-09-26 DIAGNOSIS — Z0001 Encounter for general adult medical examination with abnormal findings: Secondary | ICD-10-CM | POA: Diagnosis not present

## 2020-09-26 DIAGNOSIS — Z6824 Body mass index (BMI) 24.0-24.9, adult: Secondary | ICD-10-CM | POA: Diagnosis not present

## 2020-09-26 DIAGNOSIS — Z0000 Encounter for general adult medical examination without abnormal findings: Secondary | ICD-10-CM | POA: Diagnosis not present

## 2020-09-26 DIAGNOSIS — E039 Hypothyroidism, unspecified: Secondary | ICD-10-CM | POA: Diagnosis not present

## 2020-09-26 DIAGNOSIS — G894 Chronic pain syndrome: Secondary | ICD-10-CM | POA: Diagnosis not present

## 2020-09-26 DIAGNOSIS — E538 Deficiency of other specified B group vitamins: Secondary | ICD-10-CM | POA: Diagnosis not present

## 2020-09-26 DIAGNOSIS — M797 Fibromyalgia: Secondary | ICD-10-CM | POA: Diagnosis not present

## 2020-09-26 DIAGNOSIS — I10 Essential (primary) hypertension: Secondary | ICD-10-CM | POA: Diagnosis not present

## 2020-09-26 DIAGNOSIS — I341 Nonrheumatic mitral (valve) prolapse: Secondary | ICD-10-CM | POA: Diagnosis not present

## 2020-09-30 DIAGNOSIS — I10 Essential (primary) hypertension: Secondary | ICD-10-CM | POA: Diagnosis not present

## 2020-10-01 ENCOUNTER — Encounter: Payer: Self-pay | Admitting: Registered Nurse

## 2020-10-01 ENCOUNTER — Other Ambulatory Visit: Payer: Self-pay

## 2020-10-01 ENCOUNTER — Encounter: Payer: Medicare Other | Attending: Physical Medicine & Rehabilitation | Admitting: Registered Nurse

## 2020-10-01 VITALS — BP 118/77 | HR 104 | Temp 98.7°F | Ht 67.0 in | Wt 152.0 lb

## 2020-10-01 DIAGNOSIS — M5412 Radiculopathy, cervical region: Secondary | ICD-10-CM | POA: Diagnosis not present

## 2020-10-01 DIAGNOSIS — M25511 Pain in right shoulder: Secondary | ICD-10-CM | POA: Diagnosis not present

## 2020-10-01 DIAGNOSIS — M47812 Spondylosis without myelopathy or radiculopathy, cervical region: Secondary | ICD-10-CM | POA: Diagnosis not present

## 2020-10-01 DIAGNOSIS — Z5181 Encounter for therapeutic drug level monitoring: Secondary | ICD-10-CM | POA: Diagnosis not present

## 2020-10-01 DIAGNOSIS — M2550 Pain in unspecified joint: Secondary | ICD-10-CM | POA: Diagnosis not present

## 2020-10-01 DIAGNOSIS — Z79891 Long term (current) use of opiate analgesic: Secondary | ICD-10-CM

## 2020-10-01 DIAGNOSIS — M797 Fibromyalgia: Secondary | ICD-10-CM

## 2020-10-01 DIAGNOSIS — M542 Cervicalgia: Secondary | ICD-10-CM | POA: Diagnosis not present

## 2020-10-01 DIAGNOSIS — G8929 Other chronic pain: Secondary | ICD-10-CM

## 2020-10-01 DIAGNOSIS — M4716 Other spondylosis with myelopathy, lumbar region: Secondary | ICD-10-CM

## 2020-10-01 DIAGNOSIS — M25521 Pain in right elbow: Secondary | ICD-10-CM

## 2020-10-01 DIAGNOSIS — G894 Chronic pain syndrome: Secondary | ICD-10-CM

## 2020-10-01 DIAGNOSIS — M25512 Pain in left shoulder: Secondary | ICD-10-CM | POA: Insufficient documentation

## 2020-10-01 MED ORDER — HYDROCODONE-ACETAMINOPHEN 7.5-325 MG PO TABS: 2.0000 | ORAL_TABLET | Freq: Three times a day (TID) | ORAL | 0 refills | Status: DC | PRN

## 2020-10-01 NOTE — Progress Notes (Signed)
Subjective:    Patient ID: [name], female    DOB: Nov 06, 1975, 45 y.o.   MRN: [id number]  HPI: [name] is a 45 y.o. female who returns for follow up appointment for chronic pain and medication refill. She states her pain is located in her neck radiating into her bilateral shoulders, right elbow pain and lower back pain. Also reports generalized joint pain. She rates her pain 5. Her current exercise regime is walking and performing stretching exercises.  [name] Morphine equivalent is 45.00 MME. She is also prescribed Clonazepam by Dr. Gerarda Fraction. We have discussed the black box warning of using opioids and benzodiazepines. I highlighted the dangers of using these drugs together and discussed the adverse events including respiratory suppression, overdose, cognitive impairment and importance of compliance with current regimen. We will continue to monitor and adjust as indicated.     Last UDs was Performed on 06/17/2020, it was consistent.    Pain Inventory Average Pain 7 Pain Right Now 5 My pain is intermittent, sharp, burning, dull, stabbing, tingling and aching  In the last 24 hours, has pain interfered with the following? General activity 7 Relation with others 7 Enjoyment of life 7 What TIME of day is your pain at its worst? morning , daytime, evening, night and varies Sleep (in general) Fair  Pain is worse with: walking, bending, sitting, inactivity, standing and some activites Pain improves with: rest, heat/ice and medication Relief from Meds: 5  Family History  Problem Relation Age of Onset  . Diabetes type II Mother   . Colon polyps Mother   . Diverticulitis Mother   . Diabetes Mother   . Heart disease Mother   . Osteoporosis Father   . Diabetes type II Brother   . Uterine cancer Other   . Ovarian cancer Other   . COPD Paternal Grandfather   . Heart attack Maternal Grandmother    Social History   Socioeconomic History  . Marital status: Married     Spouse name: Not on file  . Number of children: 2  . Years of education: Not on file  . Highest education level: Not on file  Occupational History  . Occupation: Disability    Employer: UNEMPLOYED  Tobacco Use  . Smoking status: Former Smoker    Types: Cigarettes    Quit date: 06/30/2011    Years since quitting: 9.2  . Smokeless tobacco: Never Used  Vaping Use  . Vaping Use: Former  . Substances: Nicotine, Flavoring  Substance and Sexual Activity  . Alcohol use: No    Alcohol/week: 0.0 standard drinks  . Drug use: No  . Sexual activity: Yes  Other Topics Concern  . Not on file  Social History Narrative  . Not on file   Social Determinants of Health   Financial Resource Strain: Not on file  Food Insecurity: Not on file  Transportation Needs: Not on file  Physical Activity: Not on file  Stress: Not on file  Social Connections: Not on file   Past Surgical History:  Procedure Laterality Date  . CESAREAN SECTION    . Left elbow surgery    . PARTIAL HYSTERECTOMY    . TONSILLECTOMY     Past Surgical History:  Procedure Laterality Date  . CESAREAN SECTION    . Left elbow surgery    . PARTIAL HYSTERECTOMY    . TONSILLECTOMY     Past Medical History:  Diagnosis Date  . Anxiety   . Chronic pain  Dr. Letta Pate  . Degenerative disc disease    Spinal, small syrinx  . Depression   . Esophageal stricture    Status post dilatation  . Fibromyalgia   . GERD (gastroesophageal reflux disease)   . Hiatal hernia   . Internal hemorrhoids without mention of complication   . Irritable bowel syndrome   . Palpitations    No documented arrhythmias  . Personal history of colonic polyps 10/04/2000   HYPERPLASTIC POLYP  . POTS (postural orthostatic tachycardia syndrome)   . Syncope    Negative tilt table test  . Syringomyelia and syringobulbia (HCC)    BP 118/77   Pulse (!) 104   Temp 98.7 F (37.1 C)   Ht 5' 7"  (1.702 m)   Wt 152 lb (68.9 kg)   SpO2 98%   BMI 23.81  kg/m   Opioid Risk Score:   Fall Risk Score:  `1  Depression screen PHQ 2/9  Depression screen [facility] 2/9 07/15/2020 05/15/2020 03/14/2020 11/23/2019 01/03/2019 11/09/2018 04/25/2018  Decreased Interest 1 0 1 1 1 1 1   Down, Depressed, Hopeless 1 0 1 1 1 1 1   PHQ - 2 Score 2 0 2 2 2 2 2   Altered sleeping - - - - - - -  Tired, decreased energy - - - - - - -  Change in appetite - - - - - - -  Feeling bad or failure about yourself  - - - - - - -  Trouble concentrating - - - - - - -  Moving slowly or fidgety/restless - - - - - - -  Suicidal thoughts - - - - - - -  PHQ-9 Score - - - - - - -  Some recent data might be hidden    Review of Systems  Constitutional: Negative.   HENT: Negative.   Respiratory: Negative.   Cardiovascular: Negative.   Gastrointestinal: Negative.   Endocrine: Negative.   Genitourinary: Negative.   Musculoskeletal: Positive for arthralgias, back pain, myalgias, neck pain and neck stiffness.  Skin: Negative.   Allergic/Immunologic: Negative.   Neurological: Positive for headaches.  Psychiatric/Behavioral: Positive for dysphoric mood.  All other systems reviewed and are negative.      Objective:   Physical Exam Vitals and nursing note reviewed.  Constitutional:      Appearance: Normal appearance.  Neck:     Comments: Cervical Paraspinal Tenderness: C-5-C-6 Cardiovascular:     Rate and Rhythm: Normal rate and regular rhythm.     Pulses: Normal pulses.     Heart sounds: Normal heart sounds.  Pulmonary:     Effort: Pulmonary effort is normal.     Breath sounds: Normal breath sounds.  Musculoskeletal:     Cervical back: Normal range of motion and neck supple.     Comments: Normal Muscle Bulk and Muscle Testing Reveals:  Upper Extremities: Full ROM and Muscle Strength 5/5 Bilateral AC Joint Tenderness Lumbar Paraspinal Tenderness: L-3-L-5 Lower Extremities: Full ROM and Muscle Strength 5/5 Arises from Table with ease Narrow Based  Gait   Skin:     General: Skin is warm and dry.  Neurological:     Mental Status: She is alert and oriented to person, place, and time.  Psychiatric:        Mood and Affect: Mood normal.        Behavior: Behavior normal.           Assessment & Plan:  1. Lumbar degenerative disc disease:10/01/2020. Continue with current treatment  regimen:Refilled:HYDROcodone 7.5/325 mg two tablets every 8 hours as needed #180. We will continue the opioid monitoring program, this consists of regular clinic visits, examinations, urine drug screen, pill counts as well as use of [place] Controlled Substance Reporting system. A 12 month History has been reviewed on the [place] Controlled Substance Reporting Systemon 10/01/2020 2. Thoracic Spondylosis: Continuecurrent treatment withHEP and current medication regime.10/01/2020. 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 Lyrica discontinued due to adverse effects. Continue with exercise and heat therapy.10/01/2020 4. Fibromyalgia:Continue with Heat and exerciseRegimen.ContinueGabapentin.10/01/2020 5.Depression and anxiety: PCP Following.10/01/2020 6.Orthostatic hypotension: Cardiology Following.10/01/2020 7. Polyarthralgia: Continueto monitor.10/01/2020 8. ChronicBilateralShoulder Pain:.Continue current medication regime. Continue HEP as Tolerated.03/01//2022. 9. Muscle Spasm:No complaints today. Continue to monitor.10/01/2020. 10. Neuropathic Pain:ContinueGabapentin.Continue to Monitor.10/01/2020 11. Right elbow Pain:Continue current medication regimen. Continue to monitor.10/01/2020 12.Right Knee with OA: No complaints today.Continue HEP as Tolerated.Continue current medication regimen. Continue to monitor.10/01/2020  F/U in 1 month

## 2020-10-25 ENCOUNTER — Ambulatory Visit: Payer: Medicare Other | Admitting: Psychology

## 2020-10-30 DIAGNOSIS — I10 Essential (primary) hypertension: Secondary | ICD-10-CM | POA: Diagnosis not present

## 2020-10-30 DIAGNOSIS — K219 Gastro-esophageal reflux disease without esophagitis: Secondary | ICD-10-CM | POA: Diagnosis not present

## 2020-11-06 ENCOUNTER — Encounter: Payer: Self-pay | Admitting: Registered Nurse

## 2020-11-06 ENCOUNTER — Other Ambulatory Visit: Payer: Self-pay

## 2020-11-06 ENCOUNTER — Encounter: Payer: Medicare Other | Attending: Physical Medicine & Rehabilitation | Admitting: Registered Nurse

## 2020-11-06 VITALS — BP 111/75 | HR 94 | Temp 97.9°F | Ht 67.0 in | Wt 150.0 lb

## 2020-11-06 DIAGNOSIS — G8929 Other chronic pain: Secondary | ICD-10-CM

## 2020-11-06 DIAGNOSIS — M797 Fibromyalgia: Secondary | ICD-10-CM | POA: Diagnosis not present

## 2020-11-06 DIAGNOSIS — Z79899 Other long term (current) drug therapy: Secondary | ICD-10-CM

## 2020-11-06 DIAGNOSIS — M4716 Other spondylosis with myelopathy, lumbar region: Secondary | ICD-10-CM

## 2020-11-06 DIAGNOSIS — M5412 Radiculopathy, cervical region: Secondary | ICD-10-CM

## 2020-11-06 DIAGNOSIS — M47812 Spondylosis without myelopathy or radiculopathy, cervical region: Secondary | ICD-10-CM | POA: Diagnosis not present

## 2020-11-06 DIAGNOSIS — M5416 Radiculopathy, lumbar region: Secondary | ICD-10-CM

## 2020-11-06 DIAGNOSIS — G894 Chronic pain syndrome: Secondary | ICD-10-CM | POA: Diagnosis not present

## 2020-11-06 DIAGNOSIS — Z5181 Encounter for therapeutic drug level monitoring: Secondary | ICD-10-CM | POA: Diagnosis not present

## 2020-11-06 DIAGNOSIS — M25511 Pain in right shoulder: Secondary | ICD-10-CM | POA: Diagnosis not present

## 2020-11-06 DIAGNOSIS — M25512 Pain in left shoulder: Secondary | ICD-10-CM | POA: Diagnosis not present

## 2020-11-06 DIAGNOSIS — M2550 Pain in unspecified joint: Secondary | ICD-10-CM

## 2020-11-06 DIAGNOSIS — M542 Cervicalgia: Secondary | ICD-10-CM

## 2020-11-06 MED ORDER — HYDROCODONE-ACETAMINOPHEN 7.5-325 MG PO TABS: 2.0000 | ORAL_TABLET | Freq: Three times a day (TID) | ORAL | 0 refills | Status: DC | PRN

## 2020-11-06 NOTE — Progress Notes (Signed)
Subjective:    Patient ID: [name], female    DOB: [birth year], 45 y.o.   MRN: [id number]  HPI: [name] is a 45 y.o. female who returns for follow up appointment for chronic pain and medication refill. She states her pain is located in her neck radiating into her bilateral shoulders, lower back pain radiating into her bilateral lower extremities. Also reports generalized joint pain. She rates her pain 6. Her current exercise regime is walking and performing stretching exercises.  [name] is 45.00  MME.    UDS ordered today.    Pain Inventory Average Pain 6 Pain Right Now 6 My pain is intermittent, sharp, burning, dull, stabbing, tingling and aching  In the last 24 hours, has pain interfered with the following? General activity 7 Relation with others 7 Enjoyment of life 7 What TIME of day is your pain at its worst? morning  and night Sleep (in general) Fair  Pain is worse with: walking, bending, sitting, inactivity, standing and some activites Pain improves with: rest, heat/ice and medication Relief from Meds: 6  Family History  Problem Relation Age of Onset  . Diabetes type II Mother   . Colon polyps Mother   . Diverticulitis Mother   . Diabetes Mother   . Heart disease Mother   . Osteoporosis Father   . Diabetes type II Brother   . Uterine cancer Other   . Ovarian cancer Other   . COPD Paternal Grandfather   . Heart attack Maternal Grandmother    Social History   Socioeconomic History  . Marital status: Married    Spouse name: Not on file  . Number of children: 2  . Years of education: Not on file  . Highest education level: Not on file  Occupational History  . Occupation: Disability    Employer: UNEMPLOYED  Tobacco Use  . Smoking status: Former Smoker    Types: Cigarettes    Quit date: 06/30/2011    Years since quitting: 9.3  . Smokeless tobacco: Never Used  Vaping Use  . Vaping Use: Former  . Substances: Nicotine,  Flavoring  Substance and Sexual Activity  . Alcohol use: No    Alcohol/week: 0.0 standard drinks  . Drug use: No  . Sexual activity: Yes  Other Topics Concern  . Not on file  Social History Narrative  . Not on file   Social Determinants of Health   Financial Resource Strain: Not on file  Food Insecurity: Not on file  Transportation Needs: Not on file  Physical Activity: Not on file  Stress: Not on file  Social Connections: Not on file   Past Surgical History:  Procedure Laterality Date  . CESAREAN SECTION    . Left elbow surgery    . PARTIAL HYSTERECTOMY    . TONSILLECTOMY     Past Surgical History:  Procedure Laterality Date  . CESAREAN SECTION    . Left elbow surgery    . PARTIAL HYSTERECTOMY    . TONSILLECTOMY     Past Medical History:  Diagnosis Date  . Anxiety   . Chronic pain    Dr. Letta Pate  . Degenerative disc disease    Spinal, small syrinx  . Depression   . Esophageal stricture    Status post dilatation  . Fibromyalgia   . GERD (gastroesophageal reflux disease)   . Hiatal hernia   . Internal hemorrhoids without mention of complication   . Irritable bowel syndrome   .  Palpitations    No documented arrhythmias  . Personal history of colonic polyps 10/04/2000   HYPERPLASTIC POLYP  . POTS (postural orthostatic tachycardia syndrome)   . Syncope    Negative tilt table test  . Syringomyelia and syringobulbia (HCC)    BP 111/75   Pulse 94   Temp 97.9 F (36.6 C)   Ht 5' 7"  (1.702 m)   Wt 150 lb (68 kg)   SpO2 99%   BMI 23.49 kg/m   Opioid Risk Score:   Fall Risk Score:  `1  Depression screen PHQ 2/9  Depression screen [facility] 2/9 11/06/2020 07/15/2020 05/15/2020 03/14/2020 11/23/2019 01/03/2019 11/09/2018  Decreased Interest 0 1 0 1 1 1 1   Down, Depressed, Hopeless 0 1 0 1 1 1 1   PHQ - 2 Score 0 2 0 2 2 2 2   Altered sleeping - - - - - - -  Tired, decreased energy - - - - - - -  Change in appetite - - - - - - -  Feeling bad or failure about yourself   - - - - - - -  Trouble concentrating - - - - - - -  Moving slowly or fidgety/restless - - - - - - -  Suicidal thoughts - - - - - - -  PHQ-9 Score - - - - - - -  Some recent data might be hidden   Review of Systems  Musculoskeletal: Positive for back pain and neck pain.       Right arm pain, pain in both feet & both hands  Neurological: Positive for headaches.  All other systems reviewed and are negative.      Objective:   Physical Exam Vitals and nursing note reviewed.  Constitutional:      Appearance: Normal appearance.  Neck:     Comments: Cervical Paraspinal Tenderness: C-5-C-6 Cardiovascular:     Rate and Rhythm: Normal rate and regular rhythm.     Pulses: Normal pulses.     Heart sounds: Normal heart sounds.  Pulmonary:     Effort: Pulmonary effort is normal.     Breath sounds: Normal breath sounds.  Musculoskeletal:     Cervical back: Normal range of motion.     Comments: Normal Muscle Bulk and Muscle Testing Reveals:  Upper Extremities: Full ROM and Muscle Strength 5/5  Bilateral AC Joint Tenderness Thoracic Paraspinal Tenderness: T-1-T-3  Lumbar Paraspinal Tenderness: L-3-L-5 Lower Extremities: Full ROM and Muscle Strength 5/5 Arises from chair with ease  Narrow Based Gait   Skin:    General: Skin is warm and dry.  Neurological:     Mental Status: She is alert and oriented to person, place, and time.  Psychiatric:        Mood and Affect: Mood normal.        Behavior: Behavior normal.           Assessment & Plan:  1. Lumbar degenerative disc disease:11/06/2020. Continue with current treatment regimen:Refilled:HYDROcodone 7.5/325 mg two tablets every 8 hours as needed #180. We will continue the opioid monitoring program, this consists of regular clinic visits, examinations, urine drug screen, pill counts as well as use of [place] Controlled Substance Reporting system. A 12 month History has been reviewed on the [place] Controlled  Substance Reporting Systemon04/01/2021 2. Thoracic Spondylosis: Continuecurrent treatment withHEP and current medication regime.11/06/2020. 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 Lyrica discontinued due to adverse effects. Continue with exercise and heat  therapy.11/06/2020 4. Fibromyalgia:Continue with Heat and exerciseRegimen.ContinueGabapentin.11/06/2020 5.Depression and anxiety: PCP Following.11/06/2020 6.Orthostatic hypotension: Cardiology Following.11/06/2020 7. Polyarthralgia: Continueto monitor.11/06/2020 8. ChronicBilateralShoulder Pain:.Continue current medication regime. Continue HEP as Tolerated.04/06//2022. 9. Muscle Spasm:No complaints today. Continue to monitor.11/06/2020. 10. Neuropathic Pain:ContinueGabapentin.Continue to Monitor.11/06/2020 11. Right elbow Pain:No complaints today. Continue current medication regimen. Continue to monitor.11/06/2020 12.RightKnee with OA:No complaints today.Continue HEP as Tolerated.Continue current medication regimen. Continue to monitor.11/06/2020  F/U in 1 month

## 2020-11-12 LAB — TOXASSURE SELECT,+ANTIDEPR,UR

## 2020-11-13 ENCOUNTER — Telehealth: Payer: Self-pay | Admitting: *Deleted

## 2020-11-13 NOTE — Telephone Encounter (Signed)
Urine drug screen for this encounter is consistent for prescribed medication 

## 2020-12-09 ENCOUNTER — Encounter: Payer: Medicare Other | Attending: Physical Medicine & Rehabilitation | Admitting: Registered Nurse

## 2020-12-09 ENCOUNTER — Other Ambulatory Visit: Payer: Self-pay

## 2020-12-09 ENCOUNTER — Encounter: Payer: Self-pay | Admitting: Registered Nurse

## 2020-12-09 VITALS — BP 114/82 | HR 83 | Temp 97.9°F | Ht 67.0 in | Wt 153.0 lb

## 2020-12-09 DIAGNOSIS — Z5181 Encounter for therapeutic drug level monitoring: Secondary | ICD-10-CM | POA: Insufficient documentation

## 2020-12-09 DIAGNOSIS — Z79899 Other long term (current) drug therapy: Secondary | ICD-10-CM | POA: Diagnosis not present

## 2020-12-09 DIAGNOSIS — M4716 Other spondylosis with myelopathy, lumbar region: Secondary | ICD-10-CM | POA: Diagnosis not present

## 2020-12-09 DIAGNOSIS — M797 Fibromyalgia: Secondary | ICD-10-CM | POA: Diagnosis not present

## 2020-12-09 DIAGNOSIS — G894 Chronic pain syndrome: Secondary | ICD-10-CM

## 2020-12-09 DIAGNOSIS — M5412 Radiculopathy, cervical region: Secondary | ICD-10-CM | POA: Diagnosis not present

## 2020-12-09 DIAGNOSIS — M5416 Radiculopathy, lumbar region: Secondary | ICD-10-CM | POA: Diagnosis not present

## 2020-12-09 DIAGNOSIS — M542 Cervicalgia: Secondary | ICD-10-CM | POA: Diagnosis not present

## 2020-12-09 DIAGNOSIS — M47812 Spondylosis without myelopathy or radiculopathy, cervical region: Secondary | ICD-10-CM | POA: Insufficient documentation

## 2020-12-09 DIAGNOSIS — M2550 Pain in unspecified joint: Secondary | ICD-10-CM

## 2020-12-09 MED ORDER — HYDROCODONE-ACETAMINOPHEN 7.5-325 MG PO TABS: 2.0000 | ORAL_TABLET | Freq: Three times a day (TID) | ORAL | 0 refills | Status: DC | PRN

## 2020-12-09 NOTE — Progress Notes (Signed)
Subjective:    Patient ID: [name], female    DOB: 1975-11-02, 45 y.o.   MRN: [id number]  HPI: [name] is a 45 y.o. female who returns for follow up appointment for chronic pain and medication refill. She states her pain is located in her neck radiating into her right shoulder and right elbow occasionally. She also reports lower back pain, right hip pain and generalized pain. She  rates her pain 5. Her current exercise regime is walking and performing stretching exercises.  Ms. Selmer Morphine equivalent is 45.00 MME. She is also prescribed clonazepam by Dr. Gerarda Fraction. We have discussed the black box warning of using opioids and benzodiazepines. I highlighted the dangers of using these drugs together and discussed the adverse events including respiratory suppression, overdose, cognitive impairment and importance of compliance with current regimen. We will continue to monitor and adjust as indicated.    Last UDS was performed on 11/06/2020, it was consistent.      Pain Inventory Average Pain 7 Pain Right Now 5 My pain is intermittent, constant, burning, dull, stabbing and tingling  In the last 24 hours, has pain interfered with the following? General activity 7 Relation with others 7 Enjoyment of life 7 What TIME of day is your pain at its worst? morning , evening and night Sleep (in general) Fair  Pain is worse with: walking, bending, sitting, inactivity, standing and some activites Pain improves with: rest, heat/ice and medication Relief from Meds: 6  Family History  Problem Relation Age of Onset  . Diabetes type II Mother   . Colon polyps Mother   . Diverticulitis Mother   . Diabetes Mother   . Heart disease Mother   . Osteoporosis Father   . Diabetes type II Brother   . Uterine cancer Other   . Ovarian cancer Other   . COPD Paternal Grandfather   . Heart attack Maternal Grandmother    Social History   Socioeconomic History  . Marital status: Married     Spouse name: Not on file  . Number of children: 2  . Years of education: Not on file  . Highest education level: Not on file  Occupational History  . Occupation: Disability    Employer: UNEMPLOYED  Tobacco Use  . Smoking status: Former Smoker    Types: Cigarettes    Quit date: 06/30/2011    Years since quitting: 9.4  . Smokeless tobacco: Never Used  Vaping Use  . Vaping Use: Former  . Substances: Nicotine, Flavoring  Substance and Sexual Activity  . Alcohol use: No    Alcohol/week: 0.0 standard drinks  . Drug use: No  . Sexual activity: Yes  Other Topics Concern  . Not on file  Social History Narrative  . Not on file   Social Determinants of Health   Financial Resource Strain: Not on file  Food Insecurity: Not on file  Transportation Needs: Not on file  Physical Activity: Not on file  Stress: Not on file  Social Connections: Not on file   Past Surgical History:  Procedure Laterality Date  . CESAREAN SECTION    . Left elbow surgery    . PARTIAL HYSTERECTOMY    . TONSILLECTOMY     Past Surgical History:  Procedure Laterality Date  . CESAREAN SECTION    . Left elbow surgery    . PARTIAL HYSTERECTOMY    . TONSILLECTOMY     Past Medical History:  Diagnosis Date  . Anxiety   .  Chronic pain    Dr. Letta Pate  . Degenerative disc disease    Spinal, small syrinx  . Depression   . Esophageal stricture    Status post dilatation  . Fibromyalgia   . GERD (gastroesophageal reflux disease)   . Hiatal hernia   . Internal hemorrhoids without mention of complication   . Irritable bowel syndrome   . Palpitations    No documented arrhythmias  . Personal history of colonic polyps 10/04/2000   HYPERPLASTIC POLYP  . POTS (postural orthostatic tachycardia syndrome)   . Syncope    Negative tilt table test  . Syringomyelia and syringobulbia (HCC)    BP 114/82   Pulse 83   Temp 97.9 F (36.6 C)   Ht 5' 7"  (1.702 m)   Wt 153 lb (69.4 kg)   SpO2 99%   BMI 23.96 kg/m    Opioid Risk Score:   Fall Risk Score:  `1  Depression screen PHQ 2/9  Depression screen [facility] 2/9 11/06/2020 07/15/2020 05/15/2020 03/14/2020 11/23/2019 01/03/2019 11/09/2018  Decreased Interest 0 1 0 1 1 1 1   Down, Depressed, Hopeless 0 1 0 1 1 1 1   PHQ - 2 Score 0 2 0 2 2 2 2   Altered sleeping - - - - - - -  Tired, decreased energy - - - - - - -  Change in appetite - - - - - - -  Feeling bad or failure about yourself  - - - - - - -  Trouble concentrating - - - - - - -  Moving slowly or fidgety/restless - - - - - - -  Suicidal thoughts - - - - - - -  PHQ-9 Score - - - - - - -  Some recent data might be hidden   Review of Systems  Musculoskeletal: Positive for back pain, gait problem and neck pain.       Pain in both shoulders Pain in right hip & right arm Pain in both hands & feet  Neurological: Positive for headaches.  All other systems reviewed and are negative.      Objective:   Physical Exam Vitals and nursing note reviewed.  Constitutional:      Appearance: Normal appearance.  Cardiovascular:     Rate and Rhythm: Normal rate and regular rhythm.     Pulses: Normal pulses.     Heart sounds: Normal heart sounds.  Pulmonary:     Effort: Pulmonary effort is normal.     Breath sounds: Normal breath sounds.  Musculoskeletal:     Cervical back: Normal range of motion and neck supple.     Comments: Normal Muscle Bulk and Muscle Testing Reveals:  Upper Extremities: Full ROM and Muscle Strength 5/5 Bilateral AC Joint Tenderness Lumbar Paraspinal Tenderness: L-4-L-5 Lower Extremities: Full ROM and Muscle Strength 5/5 Arises from Table with ease Narrow Based  Gait   Skin:    General: Skin is warm and dry.  Neurological:     Mental Status: She is alert and oriented to person, place, and time.  Psychiatric:        Mood and Affect: Mood normal.        Behavior: Behavior normal.           Assessment & Plan:  1. Lumbar degenerative disc disease:12/09/2020. Continue  with current treatment regimen:Refilled:HYDROcodone 7.5/325 mg two tablets every 8 hours as needed #180. We will continue the opioid monitoring program, this consists of regular clinic visits, examinations, urine drug screen,  pill counts as well as use of [place] Controlled Substance Reporting system. A 12 month History has been reviewed on the [place] Controlled Substance Reporting Systemon05/04/2021 2. Thoracic Spondylosis: Continuecurrent treatment withHEP and current medication regime.12/09/2020. 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 Lyrica discontinued due to adverse effects. Continue with exercise and heat therapy.12/09/2020 4. Fibromyalgia:Continue with Heat and exerciseRegimen.ContinueGabapentin.12/09/2020 5.Depression and anxiety: PCP Following.12/09/2020 6.Orthostatic hypotension: Cardiology Following.12/09/2020 7. Polyarthralgia: Continueto monitor.12/09/2020 8. ChronicBilateralShoulder Pain:.No complaints today.Continue current medication regime. Continue HEP as Tolerated.05/09//2022. 9. Muscle Spasm:No complaints today.Continue to monitor.12/09/2020. 10. Neuropathic Pain:ContinueGabapentin.Continue to Monitor.12/09/2020 11. Right elbow Pain:Continue current medication regimen. Continue to monitor.12/09/2020 12.RightKnee with OA:No complaints today.Continue HEP as Tolerated.Continue current medication regimen.Continue to monitor.12/09/2020  F/U in 1 month

## 2020-12-23 ENCOUNTER — Other Ambulatory Visit (HOSPITAL_COMMUNITY): Payer: Self-pay | Admitting: Internal Medicine

## 2020-12-23 DIAGNOSIS — H6503 Acute serous otitis media, bilateral: Secondary | ICD-10-CM | POA: Diagnosis not present

## 2020-12-23 DIAGNOSIS — Z6824 Body mass index (BMI) 24.0-24.9, adult: Secondary | ICD-10-CM | POA: Diagnosis not present

## 2020-12-23 DIAGNOSIS — Z1231 Encounter for screening mammogram for malignant neoplasm of breast: Secondary | ICD-10-CM

## 2020-12-23 DIAGNOSIS — I10 Essential (primary) hypertension: Secondary | ICD-10-CM | POA: Diagnosis not present

## 2020-12-23 DIAGNOSIS — E063 Autoimmune thyroiditis: Secondary | ICD-10-CM | POA: Diagnosis not present

## 2021-01-09 ENCOUNTER — Other Ambulatory Visit: Payer: Self-pay

## 2021-01-09 ENCOUNTER — Encounter: Payer: Self-pay | Admitting: Registered Nurse

## 2021-01-09 ENCOUNTER — Encounter: Payer: Medicare Other | Attending: Physical Medicine & Rehabilitation | Admitting: Registered Nurse

## 2021-01-09 VITALS — BP 105/74 | HR 89 | Temp 98.0°F | Ht 67.0 in | Wt 150.0 lb

## 2021-01-09 DIAGNOSIS — M542 Cervicalgia: Secondary | ICD-10-CM | POA: Diagnosis not present

## 2021-01-09 DIAGNOSIS — Z5181 Encounter for therapeutic drug level monitoring: Secondary | ICD-10-CM | POA: Diagnosis not present

## 2021-01-09 DIAGNOSIS — M2550 Pain in unspecified joint: Secondary | ICD-10-CM

## 2021-01-09 DIAGNOSIS — G894 Chronic pain syndrome: Secondary | ICD-10-CM | POA: Diagnosis not present

## 2021-01-09 DIAGNOSIS — M797 Fibromyalgia: Secondary | ICD-10-CM | POA: Insufficient documentation

## 2021-01-09 DIAGNOSIS — Z79899 Other long term (current) drug therapy: Secondary | ICD-10-CM | POA: Diagnosis not present

## 2021-01-09 DIAGNOSIS — M4716 Other spondylosis with myelopathy, lumbar region: Secondary | ICD-10-CM | POA: Diagnosis not present

## 2021-01-09 MED ORDER — HYDROCODONE-ACETAMINOPHEN 7.5-325 MG PO TABS: 2.0000 | ORAL_TABLET | Freq: Three times a day (TID) | ORAL | 0 refills | Status: DC | PRN

## 2021-01-09 MED ORDER — GABAPENTIN 100 MG PO CAPS: ORAL_CAPSULE | ORAL | 3 refills | Status: DC

## 2021-01-09 NOTE — Progress Notes (Signed)
Subjective:    Patient ID: [name], female    DOB: 19-Sep-1975, 45 y.o.   MRN: [id number]  HPI: [name] is a 45 y.o. female who returns for follow up appointment for chronic pain and medication refill. She states her  pain is located in her neck, lower back pain and right hip pain. Also reports generalized joint pain. She rates her pain 5. Her current exercise regime is walking and performing stretching exercises.    Ms. Worrall Morphine equivalent is 45.00 MME. She is also prescribed Clonazepam by Dr. Gerarda Fraction. We have discussed the black box warning of using opioids and benzodiazepines. I highlighted the dangers of using these drugs together and discussed the adverse events including respiratory suppression, overdose, cognitive impairment and importance of compliance with current regimen. We will continue to monitor and adjust as indicated.     Last UDS was Performed on 11/06/2020, it was consistent.    Pain Inventory Average Pain 6 Pain Right Now 5 My pain is intermittent, sharp, burning, dull, stabbing, tingling, and aching  In the last 24 hours, has pain interfered with the following? General activity 7 Relation with others 7 Enjoyment of life 7 What TIME of day is your pain at its worst? morning  and night Sleep (in general) Fair  Pain is worse with: walking, bending, sitting, inactivity, standing, and unsure Pain improves with: rest, heat/ice, and medication Relief from Meds: 6  Family History  Problem Relation Age of Onset   Diabetes type II Mother    Colon polyps Mother    Diverticulitis Mother    Diabetes Mother    Heart disease Mother    Osteoporosis Father    Diabetes type II Brother    Uterine cancer Other    Ovarian cancer Other    COPD Paternal Grandfather    Heart attack Maternal Grandmother    Social History   Socioeconomic History   Marital status: Married    Spouse name: Not on file   Number of children: 2   Years of education: Not on  file   Highest education level: Not on file  Occupational History   Occupation: Disability    Employer: UNEMPLOYED  Tobacco Use   Smoking status: Former    Pack years: 0.00    Types: Cigarettes    Quit date: 06/30/2011    Years since quitting: 9.5   Smokeless tobacco: Never  Vaping Use   Vaping Use: Former   Substances: Nicotine, Flavoring  Substance and Sexual Activity   Alcohol use: No    Alcohol/week: 0.0 standard drinks   Drug use: No   Sexual activity: Yes  Other Topics Concern   Not on file  Social History Narrative   Not on file   Social Determinants of Health   Financial Resource Strain: Not on file  Food Insecurity: Not on file  Transportation Needs: Not on file  Physical Activity: Not on file  Stress: Not on file  Social Connections: Not on file   Past Surgical History:  Procedure Laterality Date   CESAREAN SECTION     Left elbow surgery     PARTIAL HYSTERECTOMY     TONSILLECTOMY     Past Surgical History:  Procedure Laterality Date   CESAREAN SECTION     Left elbow surgery     PARTIAL HYSTERECTOMY     TONSILLECTOMY     Past Medical History:  Diagnosis Date   Anxiety    Chronic pain  Dr. Letta Pate   Degenerative disc disease    Spinal, small syrinx   Depression    Esophageal stricture    Status post dilatation   Fibromyalgia    GERD (gastroesophageal reflux disease)    Hiatal hernia    Internal hemorrhoids without mention of complication    Irritable bowel syndrome    Palpitations    No documented arrhythmias   Personal history of colonic polyps 10/04/2000   HYPERPLASTIC POLYP   POTS (postural orthostatic tachycardia syndrome)    Syncope    Negative tilt table test   Syringomyelia and syringobulbia (HCC)    BP 105/74   Pulse 89   Temp 98 F (36.7 C)   Ht 5' 7"  (1.702 m)   Wt 150 lb (68 kg)   SpO2 98%   BMI 23.49 kg/m   Opioid Risk Score:   Fall Risk Score:  `1  Depression screen PHQ 2/9  Depression screen [facility] 2/9  12/09/2020 11/06/2020 07/15/2020 05/15/2020 03/14/2020 11/23/2019 01/03/2019  Decreased Interest 0 0 1 0 1 1 1   Down, Depressed, Hopeless 0 0 1 0 1 1 1   PHQ - 2 Score 0 0 2 0 2 2 2   Altered sleeping - - - - - - -  Tired, decreased energy - - - - - - -  Change in appetite - - - - - - -  Feeling bad or failure about yourself  - - - - - - -  Trouble concentrating - - - - - - -  Moving slowly or fidgety/restless - - - - - - -  Suicidal thoughts - - - - - - -  PHQ-9 Score - - - - - - -  Some recent data might be hidden    Review of Systems  Musculoskeletal:  Positive for arthralgias, back pain, gait problem and neck pain.       Pain in right elbow, feet, hands, shoulders  Neurological:  Positive for headaches.  All other systems reviewed and are negative.     Objective:   Physical Exam Vitals and nursing note reviewed.  Constitutional:      Appearance: Normal appearance.  Neck:     Comments: Cervical Paraspinal Tenderness: C-5-C-6 Cardiovascular:     Rate and Rhythm: Normal rate and regular rhythm.     Pulses: Normal pulses.     Heart sounds: Normal heart sounds.  Pulmonary:     Effort: Pulmonary effort is normal.     Breath sounds: Normal breath sounds.  Musculoskeletal:     Cervical back: Normal range of motion and neck supple.     Comments: Normal Muscle Bulk and Muscle Testing Reveals:  Upper Extremities: Full ROM and Muscle Strength 5/5 Lumbar Paraspinal Tenderness: L-3-L-5 Lower Extremities: Full ROM and Muscle Strength 5/5 Arises from Table with Ease Narrow Based Gait     Skin:    General: Skin is warm and dry.  Neurological:     Mental Status: She is alert and oriented to person, place, and time.  Psychiatric:        Mood and Affect: Mood normal.        Behavior: Behavior normal.         Assessment & Plan:  1. Lumbar degenerative disc disease: 01/09/2021. Continue with current treatment regimen: Refilled : HYDROcodone 7.5/325 mg two tablets every 8 hours as needed  #180. We will continue the opioid monitoring program, this consists of regular clinic visits, examinations, urine drug screen, pill counts as  well as use of [place] Controlled Substance Reporting system. A 12 month History has been reviewed on the [place] Controlled Substance Reporting System on 01/09/2021  2. Thoracic Spondylosis: Continue current treatment with HEP and  current medication regime. 01/09/2021. 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 Continue to Monitor. 01/09/2021.  Lyrica discontinued due to adverse effects. Continue with exercise and heat therapy. 01/09/2021 4. Fibromyalgia:Continue with Heat and  exercise Regimen. Continue Gabapentin. 01/09/2021  5.Depression and anxiety: PCP Following. 01/09/2021 6. Orthostatic hypotension: Cardiology Following. 01/09/2021 7. Polyarthralgia: Continue to monitor. 05609/2022 8. Chronic Bilateral  Shoulder Pain:.No complaints today.Continue current medication regime. Continue HEP as Tolerated. 06/09//2022. 9. Muscle Spasm:No complaints today.  Continue to monitor.  01/09/2021. 10. Neuropathic Pain: Continue Gabapentin. Continue to Monitor.  01/09/2021  11. Right elbow Pain: Continue current medication regimen. Continue to monitor. 01/09/2021 12. Right  Knee with OA: No complaints today.Continue HEP as Tolerated. Continue current medication regimen. Continue to monitor. 01/09/2021   F/U in 1 month

## 2021-02-06 ENCOUNTER — Encounter: Payer: Medicare Other | Attending: Physical Medicine & Rehabilitation | Admitting: Registered Nurse

## 2021-02-06 ENCOUNTER — Other Ambulatory Visit: Payer: Self-pay

## 2021-02-06 ENCOUNTER — Encounter: Payer: Self-pay | Admitting: Registered Nurse

## 2021-02-06 VITALS — BP 117/81 | HR 98 | Temp 98.8°F | Ht 67.0 in | Wt 150.4 lb

## 2021-02-06 DIAGNOSIS — M25512 Pain in left shoulder: Secondary | ICD-10-CM | POA: Insufficient documentation

## 2021-02-06 DIAGNOSIS — M4716 Other spondylosis with myelopathy, lumbar region: Secondary | ICD-10-CM | POA: Insufficient documentation

## 2021-02-06 DIAGNOSIS — G8929 Other chronic pain: Secondary | ICD-10-CM | POA: Insufficient documentation

## 2021-02-06 DIAGNOSIS — M542 Cervicalgia: Secondary | ICD-10-CM | POA: Diagnosis not present

## 2021-02-06 DIAGNOSIS — M2550 Pain in unspecified joint: Secondary | ICD-10-CM | POA: Diagnosis not present

## 2021-02-06 DIAGNOSIS — Z79899 Other long term (current) drug therapy: Secondary | ICD-10-CM | POA: Diagnosis not present

## 2021-02-06 DIAGNOSIS — G894 Chronic pain syndrome: Secondary | ICD-10-CM | POA: Diagnosis not present

## 2021-02-06 DIAGNOSIS — M5412 Radiculopathy, cervical region: Secondary | ICD-10-CM | POA: Diagnosis not present

## 2021-02-06 DIAGNOSIS — Z5181 Encounter for therapeutic drug level monitoring: Secondary | ICD-10-CM | POA: Diagnosis not present

## 2021-02-06 DIAGNOSIS — M797 Fibromyalgia: Secondary | ICD-10-CM | POA: Diagnosis not present

## 2021-02-06 DIAGNOSIS — M47812 Spondylosis without myelopathy or radiculopathy, cervical region: Secondary | ICD-10-CM | POA: Diagnosis not present

## 2021-02-06 DIAGNOSIS — M25511 Pain in right shoulder: Secondary | ICD-10-CM | POA: Diagnosis not present

## 2021-02-06 MED ORDER — HYDROCODONE-ACETAMINOPHEN 7.5-325 MG PO TABS: 2.0000 | ORAL_TABLET | Freq: Three times a day (TID) | ORAL | 0 refills | Status: DC | PRN

## 2021-02-06 NOTE — Progress Notes (Signed)
Subjective:    Patient ID: [name], female    DOB: 1975/08/10, 45 y.o.   MRN: [id number]  HPI: [name] is a 45 y.o. female who returns for follow up appointment for chronic pain and medication refill. She states her pain is located in her neck radiating into her bilateral shoulders, lower ack pain and generalized joint pain.She  rates her pain 5. Her current exercise regime is walking and performing stretching exercises.  Ms. Lutz Morphine equivalent is 45.00 MME.  She is also prescribed Clonazepam by Dr. Gerarda Fraction .We have discussed the black box warning of using opioids and benzodiazepines. I highlighted the dangers of using these drugs together and discussed the adverse events including respiratory suppression, overdose, cognitive impairment and importance of compliance with current regimen. We will continue to monitor and adjust as indicated.      Last UDS was Performed on 11/06/2020, it was consistent  Pain Inventory Average Pain 7 Pain Right Now 5 My pain is intermittent, sharp, burning, dull, stabbing, tingling, and aching  In the last 24 hours, has pain interfered with the following? General activity 7 Relation with others 7 Enjoyment of life 7 What TIME of day is your pain at its worst? morning  and night Sleep (in general) Fair  Pain is worse with: walking, bending, sitting, inactivity, standing, and some activites Pain improves with: rest, heat/ice, and medication Relief from Meds: 6  Family History  Problem Relation Age of Onset   Diabetes type II Mother    Colon polyps Mother    Diverticulitis Mother    Diabetes Mother    Heart disease Mother    Osteoporosis Father    Diabetes type II Brother    Uterine cancer Other    Ovarian cancer Other    COPD Paternal Grandfather    Heart attack Maternal Grandmother    Social History   Socioeconomic History   Marital status: Married    Spouse name: Not on file   Number of children: 2   Years of  education: Not on file   Highest education level: Not on file  Occupational History   Occupation: Disability    Employer: UNEMPLOYED  Tobacco Use   Smoking status: Former    Pack years: 0.00    Types: Cigarettes    Quit date: 06/30/2011    Years since quitting: 9.6   Smokeless tobacco: Never  Vaping Use   Vaping Use: Former   Substances: Nicotine, Flavoring  Substance and Sexual Activity   Alcohol use: No    Alcohol/week: 0.0 standard drinks   Drug use: No   Sexual activity: Yes  Other Topics Concern   Not on file  Social History Narrative   Not on file   Social Determinants of Health   Financial Resource Strain: Not on file  Food Insecurity: Not on file  Transportation Needs: Not on file  Physical Activity: Not on file  Stress: Not on file  Social Connections: Not on file   Past Surgical History:  Procedure Laterality Date   CESAREAN SECTION     Left elbow surgery     PARTIAL HYSTERECTOMY     TONSILLECTOMY     Past Surgical History:  Procedure Laterality Date   CESAREAN SECTION     Left elbow surgery     PARTIAL HYSTERECTOMY     TONSILLECTOMY     Past Medical History:  Diagnosis Date   Anxiety    Chronic pain    Dr.  Kirsteins   Degenerative disc disease    Spinal, small syrinx   Depression    Esophageal stricture    Status post dilatation   Fibromyalgia    GERD (gastroesophageal reflux disease)    Hiatal hernia    Internal hemorrhoids without mention of complication    Irritable bowel syndrome    Palpitations    No documented arrhythmias   Personal history of colonic polyps 10/04/2000   HYPERPLASTIC POLYP   POTS (postural orthostatic tachycardia syndrome)    Syncope    Negative tilt table test   Syringomyelia and syringobulbia (HCC)    There were no vitals taken for this visit.  Opioid Risk Score:   Fall Risk Score:  `1  Depression screen PHQ 2/9  Depression screen [facility] 2/9 02/06/2021 01/09/2021 12/09/2020 11/06/2020 07/15/2020 05/15/2020 03/14/2020   Decreased Interest 1 1 0 0 1 0 1  Down, Depressed, Hopeless 1 1 0 0 1 0 1  PHQ - 2 Score 2 2 0 0 2 0 2  Altered sleeping - - - - - - -  Tired, decreased energy - - - - - - -  Change in appetite - - - - - - -  Feeling bad or failure about yourself  - - - - - - -  Trouble concentrating - - - - - - -  Moving slowly or fidgety/restless - - - - - - -  Suicidal thoughts - - - - - - -  PHQ-9 Score - - - - - - -  Some recent data might be hidden     Review of Systems  Constitutional: Negative.   HENT: Negative.    Eyes: Negative.   Respiratory: Negative.    Cardiovascular: Negative.   Gastrointestinal: Negative.   Endocrine: Negative.   Genitourinary: Negative.   Musculoskeletal:  Positive for arthralgias, back pain and myalgias.  Skin: Negative.   Allergic/Immunologic: Negative.   Neurological: Negative.   Hematological: Negative.   Psychiatric/Behavioral:  Positive for dysphoric mood.   All other systems reviewed and are negative.     Objective:   Physical Exam Vitals and nursing note reviewed.  Constitutional:      Appearance: Normal appearance.  Cardiovascular:     Rate and Rhythm: Normal rate and regular rhythm.     Pulses: Normal pulses.     Heart sounds: Normal heart sounds.  Pulmonary:     Effort: Pulmonary effort is normal.     Breath sounds: Normal breath sounds.  Musculoskeletal:     Cervical back: Normal range of motion and neck supple.     Comments: Normal Muscle Bulk and Muscle Testing Reveals:  Upper Extremities: Full ROM and Muscle Strength 5/5 Bilateral AC Joint Tenderness  Lumbar Paraspinal Tenderness: L-1-L-3 Lower Extremities: Full ROM and Muscle Strength 5/5 Arises from Table with Ease Narrow Based  Gait     Skin:    General: Skin is warm and dry.  Neurological:     Mental Status: She is alert and oriented to person, place, and time.  Psychiatric:        Mood and Affect: Mood normal.        Behavior: Behavior normal.         Assessment  & Plan:  1. Lumbar degenerative disc disease: 02/06/2021. Continue with current treatment regimen: Refilled : HYDROcodone 7.5/325 mg two tablets every 8 hours as needed #180. We will continue the opioid monitoring program, this consists of regular clinic visits, examinations, urine drug screen,  pill counts as well as use of [place] Controlled Substance Reporting system. A 12 month History has been reviewed on the [place] Controlled Substance Reporting System on 02/06/2021  2. Thoracic Spondylosis: Continue current treatment with HEP and  current medication regime. 02/06/2021. 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 Continue to Monitor. 02/06/2021.  Lyrica discontinued due to adverse effects. Continue with exercise and heat therapy. 02/06/2021 4. Fibromyalgia:Continue with Heat and  exercise Regimen. Continue Gabapentin. 02/06/2021  5.Depression and anxiety: PCP Following. 02/06/2021 6. Orthostatic hypotension: Cardiology Following. 02/06/2021 7. Polyarthralgia: Continue to monitor. 02/06/2021 8. Chronic Bilateral  Shoulder Pain:.No complaints today.Continue current medication regime. Continue HEP as Tolerated. 07/07//2022. 9. Muscle Spasm:No complaints today.  Continue to monitor.  02/06/2021. 10. Neuropathic Pain: Continue Gabapentin. Continue to Monitor.  02/06/2021  11. Right elbow Pain: Continue current medication regimen. Continue to monitor. 02/06/2021 12. Right  Knee with OA: No complaints today.Continue HEP as Tolerated. Continue current medication regimen. Continue to monitor. 02/06/2021   F/U in 1 month

## 2021-03-06 ENCOUNTER — Encounter: Payer: Medicare Other | Attending: Physical Medicine & Rehabilitation | Admitting: Registered Nurse

## 2021-03-06 ENCOUNTER — Other Ambulatory Visit: Payer: Self-pay

## 2021-03-06 VITALS — BP 117/78 | HR 89 | Temp 98.3°F | Ht 67.0 in | Wt 154.6 lb

## 2021-03-06 DIAGNOSIS — M5412 Radiculopathy, cervical region: Secondary | ICD-10-CM

## 2021-03-06 DIAGNOSIS — M2550 Pain in unspecified joint: Secondary | ICD-10-CM | POA: Diagnosis not present

## 2021-03-06 DIAGNOSIS — M25511 Pain in right shoulder: Secondary | ICD-10-CM

## 2021-03-06 DIAGNOSIS — M542 Cervicalgia: Secondary | ICD-10-CM | POA: Insufficient documentation

## 2021-03-06 DIAGNOSIS — Z5181 Encounter for therapeutic drug level monitoring: Secondary | ICD-10-CM

## 2021-03-06 DIAGNOSIS — Z79891 Long term (current) use of opiate analgesic: Secondary | ICD-10-CM | POA: Insufficient documentation

## 2021-03-06 DIAGNOSIS — G894 Chronic pain syndrome: Secondary | ICD-10-CM | POA: Diagnosis not present

## 2021-03-06 DIAGNOSIS — M47812 Spondylosis without myelopathy or radiculopathy, cervical region: Secondary | ICD-10-CM

## 2021-03-06 DIAGNOSIS — M797 Fibromyalgia: Secondary | ICD-10-CM

## 2021-03-06 DIAGNOSIS — M25512 Pain in left shoulder: Secondary | ICD-10-CM | POA: Diagnosis not present

## 2021-03-06 DIAGNOSIS — M47814 Spondylosis without myelopathy or radiculopathy, thoracic region: Secondary | ICD-10-CM | POA: Diagnosis not present

## 2021-03-06 DIAGNOSIS — M545 Low back pain, unspecified: Secondary | ICD-10-CM | POA: Diagnosis not present

## 2021-03-06 DIAGNOSIS — M4716 Other spondylosis with myelopathy, lumbar region: Secondary | ICD-10-CM | POA: Insufficient documentation

## 2021-03-06 DIAGNOSIS — G8929 Other chronic pain: Secondary | ICD-10-CM | POA: Diagnosis not present

## 2021-03-06 MED ORDER — HYDROCODONE-ACETAMINOPHEN 7.5-325 MG PO TABS: 2.0000 | ORAL_TABLET | Freq: Three times a day (TID) | ORAL | 0 refills | Status: DC | PRN

## 2021-03-06 NOTE — Progress Notes (Signed)
Subjective:    Patient ID: [name], female    DOB: Dec 25, 1975, 45 y.o.   MRN: [id number]  HPI: [name] is a 45 y.o. female who returns for follow up appointment for chronic pain and medication refill. She states her pain is located in her neck radiating into her bilateral shoulders and mid- lower back pain.She  rates her pain 5. Her current exercise regime is walking and performing stretching exercises.  Mr. Klos Morphine equivalent is 45.00  MME.   UDS  ordered today.     Pain Inventory Average Pain 6 Pain Right Now 5 My pain is intermittent, sharp, burning, dull, stabbing, tingling, and aching  In the last 24 hours, has pain interfered with the following? General activity 5 Relation with others 5 Enjoyment of life 5 What TIME of day is your pain at its worst? varies Sleep (in general) Fair  Pain is worse with: walking, bending, sitting, inactivity, standing, and some activites Pain improves with: rest, heat/ice, and medication Relief from Meds: 6  Family History  Problem Relation Age of Onset   Diabetes type II Mother    Colon polyps Mother    Diverticulitis Mother    Diabetes Mother    Heart disease Mother    Osteoporosis Father    Diabetes type II Brother    Uterine cancer Other    Ovarian cancer Other    COPD Paternal Grandfather    Heart attack Maternal Grandmother    Social History   Socioeconomic History   Marital status: Married    Spouse name: Not on file   Number of children: 2   Years of education: Not on file   Highest education level: Not on file  Occupational History   Occupation: Disability    Employer: UNEMPLOYED  Tobacco Use   Smoking status: Former    Types: Cigarettes    Quit date: 06/30/2011    Years since quitting: 9.6   Smokeless tobacco: Never  Vaping Use   Vaping Use: Former   Substances: Nicotine, Flavoring  Substance and Sexual Activity   Alcohol use: No    Alcohol/week: 0.0 standard drinks   Drug use: No    Sexual activity: Yes  Other Topics Concern   Not on file  Social History Narrative   Not on file   Social Determinants of Health   Financial Resource Strain: Not on file  Food Insecurity: Not on file  Transportation Needs: Not on file  Physical Activity: Not on file  Stress: Not on file  Social Connections: Not on file   Past Surgical History:  Procedure Laterality Date   CESAREAN SECTION     Left elbow surgery     PARTIAL HYSTERECTOMY     TONSILLECTOMY     Past Surgical History:  Procedure Laterality Date   CESAREAN SECTION     Left elbow surgery     PARTIAL HYSTERECTOMY     TONSILLECTOMY     Past Medical History:  Diagnosis Date   Anxiety    Chronic pain    Dr. Letta Pate   Degenerative disc disease    Spinal, small syrinx   Depression    Esophageal stricture    Status post dilatation   Fibromyalgia    GERD (gastroesophageal reflux disease)    Hiatal hernia    Internal hemorrhoids without mention of complication    Irritable bowel syndrome    Palpitations    No documented arrhythmias   Personal history of colonic  polyps 10/04/2000   HYPERPLASTIC POLYP   POTS (postural orthostatic tachycardia syndrome)    Syncope    Negative tilt table test   Syringomyelia and syringobulbia (HCC)    BP 117/78 (BP Location: Right Arm, Patient Position: Sitting, Cuff Size: Normal)   Pulse 89   Temp 98.3 F (36.8 C) (Oral)   Ht 5' 7"  (1.702 m)   Wt 154 lb 9.6 oz (70.1 kg)   SpO2 99%   BMI 24.21 kg/m   Opioid Risk Score:   Fall Risk Score:  `1  Depression screen PHQ 2/9  Depression screen [facility] 2/9 02/06/2021 01/09/2021 12/09/2020 11/06/2020 07/15/2020 05/15/2020 03/14/2020  Decreased Interest 1 1 0 0 1 0 1  Down, Depressed, Hopeless 1 1 0 0 1 0 1  PHQ - 2 Score 2 2 0 0 2 0 2  Altered sleeping - - - - - - -  Tired, decreased energy - - - - - - -  Change in appetite - - - - - - -  Feeling bad or failure about yourself  - - - - - - -  Trouble concentrating - - - - - - -   Moving slowly or fidgety/restless - - - - - - -  Suicidal thoughts - - - - - - -  PHQ-9 Score - - - - - - -  Some recent data might be hidden     Review of Systems  Musculoskeletal:  Positive for back pain.       Facial pain Shoulder pain Right hip pain Bilateral hand pain Bilateral feet pain    All other systems reviewed and are negative.     Objective:   Physical Exam Vitals and nursing note reviewed.  Constitutional:      Appearance: Normal appearance.  Neck:     Comments: Cervical Paraspinal Tenderness: C-5-C-6  Cardiovascular:     Rate and Rhythm: Normal rate and regular rhythm.     Pulses: Normal pulses.     Heart sounds: Normal heart sounds.  Pulmonary:     Effort: Pulmonary effort is normal.     Breath sounds: Normal breath sounds.  Musculoskeletal:     Cervical back: Normal range of motion and neck supple.     Comments: Normal Muscle Bulk and Muscle Testing Reveals:  Upper Extremities: Full ROM and Muscle Strength 5/5 Bilateral AC Joint Tenderness  Thoracic Paraspinal Tenderness: T-7-T-9 Lumbar Paraspinal Tenderness: L-3-L-5 Lower Extremities: Full ROM and Muscle Strength 5/5 Arises from Table with ease Narrow Based  Gait     Skin:    General: Skin is warm and dry.  Neurological:     General: No focal deficit present.     Mental Status: She is alert and oriented to person, place, and time.  Psychiatric:        Mood and Affect: Mood normal.        Behavior: Behavior normal.         Assessment & Plan:  1. Lumbar degenerative disc disease: 03/06/2021. Continue with current treatment regimen: Refilled : HYDROcodone 7.5/325 mg two tablets every 8 hours as needed #180. We will continue the opioid monitoring program, this consists of regular clinic visits, examinations, urine drug screen, pill counts as well as use of [place] Controlled Substance Reporting system. A 12 month History has been reviewed on the [place] Controlled Substance  Reporting System on 03/06/2021  2. Thoracic Spondylosis: Continue current treatment with HEP and  current medication regime. 03/06/2021. 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 Continue to Monitor. 03/06/2021.  Lyrica discontinued due to adverse effects. Continue with exercise and heat therapy. 03/06/2021 4. Fibromyalgia:Continue with Heat and  exercise Regimen. Continue Gabapentin. 03/06/2021  5.Depression and anxiety: PCP Following. 03/06/2021 6. Orthostatic hypotension: Cardiology Following. 03/06/2021 7. Polyarthralgia: Continue to monitor. 03/06/2021 8. Chronic Bilateral  Shoulder Pain:Continue current medication regime. Continue HEP as Tolerated. 08/04//2022. 9. Muscle Spasm:No complaints today.  Continue to monitor.  03/06/2021. 10. Neuropathic Pain: Continue Gabapentin. Continue to Monitor.  03/06/2021  11. Right elbow Pain:No complaints today. Continue current medication regimen. Continue to monitor. 02/06/2021 12. Right  Knee with OA: No complaints today.Continue HEP as Tolerated. Continue current medication regimen. Continue to monitor. 03/06/2021   F/U in 1 month

## 2021-03-07 ENCOUNTER — Encounter: Payer: Self-pay | Admitting: Registered Nurse

## 2021-03-12 LAB — TOXASSURE SELECT,+ANTIDEPR,UR

## 2021-03-14 ENCOUNTER — Telehealth: Payer: Self-pay | Admitting: *Deleted

## 2021-03-14 NOTE — Telephone Encounter (Signed)
Urine drug screen for this encounter is consistent for prescribed medication 

## 2021-03-31 ENCOUNTER — Encounter: Payer: Self-pay | Admitting: Registered Nurse

## 2021-03-31 ENCOUNTER — Encounter: Payer: Medicare Other | Admitting: Registered Nurse

## 2021-03-31 ENCOUNTER — Other Ambulatory Visit: Payer: Self-pay

## 2021-03-31 VITALS — BP 120/73 | HR 98 | Temp 99.0°F | Ht 67.0 in | Wt 151.0 lb

## 2021-03-31 DIAGNOSIS — Z5181 Encounter for therapeutic drug level monitoring: Secondary | ICD-10-CM

## 2021-03-31 DIAGNOSIS — M542 Cervicalgia: Secondary | ICD-10-CM | POA: Diagnosis not present

## 2021-03-31 DIAGNOSIS — M2550 Pain in unspecified joint: Secondary | ICD-10-CM

## 2021-03-31 DIAGNOSIS — M47812 Spondylosis without myelopathy or radiculopathy, cervical region: Secondary | ICD-10-CM

## 2021-03-31 DIAGNOSIS — M47814 Spondylosis without myelopathy or radiculopathy, thoracic region: Secondary | ICD-10-CM

## 2021-03-31 DIAGNOSIS — G8929 Other chronic pain: Secondary | ICD-10-CM

## 2021-03-31 DIAGNOSIS — M25512 Pain in left shoulder: Secondary | ICD-10-CM

## 2021-03-31 DIAGNOSIS — G894 Chronic pain syndrome: Secondary | ICD-10-CM

## 2021-03-31 DIAGNOSIS — M4716 Other spondylosis with myelopathy, lumbar region: Secondary | ICD-10-CM

## 2021-03-31 DIAGNOSIS — M5412 Radiculopathy, cervical region: Secondary | ICD-10-CM | POA: Diagnosis not present

## 2021-03-31 DIAGNOSIS — M797 Fibromyalgia: Secondary | ICD-10-CM | POA: Diagnosis not present

## 2021-03-31 DIAGNOSIS — M25511 Pain in right shoulder: Secondary | ICD-10-CM

## 2021-03-31 DIAGNOSIS — Z79891 Long term (current) use of opiate analgesic: Secondary | ICD-10-CM

## 2021-03-31 DIAGNOSIS — M545 Low back pain, unspecified: Secondary | ICD-10-CM | POA: Diagnosis not present

## 2021-03-31 MED ORDER — HYDROCODONE-ACETAMINOPHEN 7.5-325 MG PO TABS: 2.0000 | ORAL_TABLET | Freq: Three times a day (TID) | ORAL | 0 refills | Status: DC | PRN

## 2021-03-31 NOTE — Patient Instructions (Signed)
My- Chart:   [phone]

## 2021-03-31 NOTE — Progress Notes (Signed)
Subjective:    Patient ID: [name], female    DOB: 1976/02/27, 45 y.o.   MRN: [id number]  HPI: [name] is a 45 y.o. female who returns for follow up appointment for chronic pain and medication refill. She states her pain is located in her neck radiating into her bilateral shoulders, mid-lower back pain and generalized joint pain.She.rates her pain 6. Her current exercise regime is walking and performing stretching exercises.  [name] expressed interest receiving Botox for her Migraines, she states she had Botox in the past. This will be discussed with Dr Letta Pate, she verbalizes understanding.   [name] equivalent is 45.00 MME.   Last UDS was Performed on 03/06/2021, it was consistent.      Pain Inventory Average Pain 6 Pain Right Now 6 My pain is intermittent, sharp, burning, dull, stabbing, tingling, and aching  In the last 24 hours, has pain interfered with the following? General activity 7 Relation with others 7 Enjoyment of life 7 What TIME of day is your pain at its worst? morning  and night Sleep (in general) Fair  Pain is worse with: walking, bending, sitting, inactivity, standing, and some activites Pain improves with: rest, heat/ice, and medication Relief from Meds: 6  Family History  Problem Relation Age of Onset   Diabetes type II Mother    Colon polyps Mother    Diverticulitis Mother    Diabetes Mother    Heart disease Mother    Osteoporosis Father    Diabetes type II Brother    Uterine cancer Other    Ovarian cancer Other    COPD Paternal Grandfather    Heart attack Maternal Grandmother    Social History   Socioeconomic History   Marital status: Married    Spouse name: Not on file   Number of children: 2   Years of education: Not on file   Highest education level: Not on file  Occupational History   Occupation: Disability    Employer: UNEMPLOYED  Tobacco Use   Smoking status: Former    Types: Cigarettes    Quit  date: 06/30/2011    Years since quitting: 9.7   Smokeless tobacco: Never  Vaping Use   Vaping Use: Former   Substances: Nicotine, Flavoring  Substance and Sexual Activity   Alcohol use: No    Alcohol/week: 0.0 standard drinks   Drug use: No   Sexual activity: Yes  Other Topics Concern   Not on file  Social History Narrative   Not on file   Social Determinants of Health   Financial Resource Strain: Not on file  Food Insecurity: Not on file  Transportation Needs: Not on file  Physical Activity: Not on file  Stress: Not on file  Social Connections: Not on file   Past Surgical History:  Procedure Laterality Date   CESAREAN SECTION     Left elbow surgery     PARTIAL HYSTERECTOMY     TONSILLECTOMY     Past Surgical History:  Procedure Laterality Date   CESAREAN SECTION     Left elbow surgery     PARTIAL HYSTERECTOMY     TONSILLECTOMY     Past Medical History:  Diagnosis Date   Anxiety    Chronic pain    Dr. Letta Pate   Degenerative disc disease    Spinal, small syrinx   Depression    Esophageal stricture    Status post dilatation   Fibromyalgia    GERD (gastroesophageal reflux  disease)    Hiatal hernia    Internal hemorrhoids without mention of complication    Irritable bowel syndrome    Palpitations    No documented arrhythmias   Personal history of colonic polyps 10/04/2000   HYPERPLASTIC POLYP   POTS (postural orthostatic tachycardia syndrome)    Syncope    Negative tilt table test   Syringomyelia and syringobulbia (HCC)    BP 120/73   Pulse 98   Temp 99 F (37.2 C)   Ht 5' 7"  (1.702 m)   Wt 151 lb (68.5 kg)   SpO2 97%   BMI 23.65 kg/m   Opioid Risk Score:   Fall Risk Score:  `1  Depression screen PHQ 2/9  Depression screen [facility] 2/9 02/06/2021 01/09/2021 12/09/2020 11/06/2020 07/15/2020 05/15/2020 03/14/2020  Decreased Interest 1 1 0 0 1 0 1  Down, Depressed, Hopeless 1 1 0 0 1 0 1  PHQ - 2 Score 2 2 0 0 2 0 2  Altered sleeping - - - - - - -  Tired,  decreased energy - - - - - - -  Change in appetite - - - - - - -  Feeling bad or failure about yourself  - - - - - - -  Trouble concentrating - - - - - - -  Moving slowly or fidgety/restless - - - - - - -  Suicidal thoughts - - - - - - -  PHQ-9 Score - - - - - - -  Some recent data might be hidden    Review of Systems  Constitutional: Negative.   HENT: Negative.    Eyes: Negative.   Respiratory: Negative.    Cardiovascular: Negative.   Gastrointestinal: Negative.   Endocrine: Negative.   Genitourinary: Negative.   Musculoskeletal:  Positive for back pain and neck pain.  Skin: Negative.   Allergic/Immunologic: Negative.   Neurological:  Positive for headaches.  Hematological: Negative.   Psychiatric/Behavioral: Negative.    All other systems reviewed and are negative.     Objective:   Physical Exam Vitals and nursing note reviewed.  Constitutional:      Appearance: Normal appearance.  Neck:     Comments: Cervical Paraspinal Tenderness: C-5-C-6 Cardiovascular:     Rate and Rhythm: Normal rate and regular rhythm.     Pulses: Normal pulses.     Heart sounds: Normal heart sounds.  Pulmonary:     Effort: Pulmonary effort is normal.     Breath sounds: Normal breath sounds.  Musculoskeletal:     Cervical back: Normal range of motion and neck supple.     Comments: Normal Muscle Bulk and Muscle Testing Reveals:  Upper Extremities: Full ROM and Muscle Strength 5/5 Bilateral AC Joint Tenderness Thoracic Paraspinal Tenderness: T-7-T-9 Lumbar Paraspinal Tenderness: L-3-L-5 Lower Extremities: Full ROM and Muscle Strength 5/5 Arises from Table with ease Narrow Based  Gait     Skin:    General: Skin is warm and dry.  Neurological:     Mental Status: She is alert and oriented to person, place, and time.  Psychiatric:        Mood and Affect: Mood normal.        Behavior: Behavior normal.          Assessment & Plan:  1. Lumbar degenerative disc disease:  03/31/2021. Continue with current treatment regimen: Refilled : HYDROcodone 7.5/325 mg two tablets every 8 hours as needed #180. We will continue the opioid monitoring program, this consists of regular  clinic visits, examinations, urine drug screen, pill counts as well as use of [place] Controlled Substance Reporting system. A 12 month History has been reviewed on the [place] Controlled Substance Reporting System on 03/31/2021  2. Thoracic Spondylosis: Continue current treatment with HEP and  current medication regime. 03/31/2021. 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 Continue to Monitor. 03/31/2021.  Lyrica discontinued due to adverse effects. Continue with exercise and heat therapy. 03/31/2021 4. Fibromyalgia:Continue with Heat and  exercise Regimen. Continue Gabapentin. 03/31/2021  5.Depression and anxiety: PCP Following. 03/31/2021 6. Orthostatic hypotension: Cardiology Following. 03/31/2021 7. Polyarthralgia: Continue to monitor. 03/31/2021 8. Chronic Bilateral  Shoulder Pain:Continue current medication regime. Continue HEP as Tolerated. 08/29//2022. 9. Muscle Spasm:No complaints today.  Continue to monitor.  03/31/2021. 10. Neuropathic Pain: Continue Gabapentin. Continue to Monitor.  03/31/2021  11. Right elbow Pain:No complaints today. Continue current medication regimen. Continue to monitor. 03/31/2021 12. Right  Knee with OA: No complaints today.Continue HEP as Tolerated. Continue current medication regimen. Continue to monitor. 03/31/2021   F/U in 1 month

## 2021-04-30 ENCOUNTER — Encounter: Payer: Medicare Other | Attending: Physical Medicine & Rehabilitation | Admitting: Registered Nurse

## 2021-04-30 ENCOUNTER — Other Ambulatory Visit: Payer: Self-pay

## 2021-04-30 ENCOUNTER — Encounter: Payer: Self-pay | Admitting: Registered Nurse

## 2021-04-30 VITALS — Ht 67.0 in | Wt 154.0 lb

## 2021-04-30 DIAGNOSIS — G5793 Unspecified mononeuropathy of bilateral lower limbs: Secondary | ICD-10-CM | POA: Diagnosis not present

## 2021-04-30 DIAGNOSIS — M542 Cervicalgia: Secondary | ICD-10-CM | POA: Diagnosis not present

## 2021-04-30 DIAGNOSIS — M792 Neuralgia and neuritis, unspecified: Secondary | ICD-10-CM | POA: Diagnosis not present

## 2021-04-30 DIAGNOSIS — G8929 Other chronic pain: Secondary | ICD-10-CM

## 2021-04-30 DIAGNOSIS — M47814 Spondylosis without myelopathy or radiculopathy, thoracic region: Secondary | ICD-10-CM | POA: Diagnosis not present

## 2021-04-30 DIAGNOSIS — M4716 Other spondylosis with myelopathy, lumbar region: Secondary | ICD-10-CM | POA: Diagnosis not present

## 2021-04-30 DIAGNOSIS — M545 Low back pain, unspecified: Secondary | ICD-10-CM | POA: Diagnosis not present

## 2021-04-30 DIAGNOSIS — M797 Fibromyalgia: Secondary | ICD-10-CM | POA: Diagnosis not present

## 2021-04-30 DIAGNOSIS — M2550 Pain in unspecified joint: Secondary | ICD-10-CM | POA: Insufficient documentation

## 2021-04-30 MED ORDER — HYDROCODONE-ACETAMINOPHEN 7.5-325 MG PO TABS: 2.0000 | ORAL_TABLET | Freq: Three times a day (TID) | ORAL | 0 refills | Status: DC | PRN

## 2021-04-30 NOTE — Progress Notes (Signed)
Subjective:    Patient ID: [name], female    DOB: 05/24/1976, 45 y.o.   MRN: [id number]  HPI: [name] is a 45 y.o. female who is scheduled for My-Chart Video visit, she is caring for her mother- in law who recently had surgery. [name], agrees with My-Chart Video visit and verbalizes understanding. She  states her pain is located in her neck, mid- lower back pain and generalized joint pain . [name] reports she has burning in her bilateral hands and bilateral feet, she also reports she has a constant headache daily, in the past she was prescribed Topamax and currently on amitriptyline and  Fiorcet with no relief. She would like to speak with Dr Letta Pate regarding Botox, she was instructed to keep a Headache Journal and bring it to her appointment, she verbalizes understanding. She rates her pain 6. Her current exercise regime is walking and performing stretching exercises.  [name] Morphine equivalent is 45.00 MME.   Last UDS was Performed on 03/06/2021, it was consistent.    Pain Inventory Average Pain 6 Pain Right Now 6 My pain is intermittent, constant, sharp, burning, dull, stabbing, tingling, and aching  In the last 24 hours, has pain interfered with the following? General activity 8 Relation with others 8 Enjoyment of life 8 What TIME of day is your pain at its worst? morning , evening, and night Sleep (in general)  fair to poor  Pain is worse with: walking, bending, sitting, inactivity, standing, and some activites Pain improves with: rest, heat/ice, and medication Relief from Meds: 7  Family History  Problem Relation Age of Onset   Diabetes type II Mother    Colon polyps Mother    Diverticulitis Mother    Diabetes Mother    Heart disease Mother    Osteoporosis Father    Diabetes type II Brother    Uterine cancer Other    Ovarian cancer Other    COPD Paternal Grandfather    Heart attack Maternal Grandmother    Social History   Socioeconomic  History   Marital status: Married    Spouse name: Not on file   Number of children: 2   Years of education: Not on file   Highest education level: Not on file  Occupational History   Occupation: Disability    Employer: UNEMPLOYED  Tobacco Use   Smoking status: Former    Types: Cigarettes    Quit date: 06/30/2011    Years since quitting: 9.8   Smokeless tobacco: Never  Vaping Use   Vaping Use: Some days   Substances: Flavoring  Substance and Sexual Activity   Alcohol use: No    Alcohol/week: 0.0 standard drinks   Drug use: No   Sexual activity: Yes  Other Topics Concern   Not on file  Social History Narrative   Not on file   Social Determinants of Health   Financial Resource Strain: Not on file  Food Insecurity: Not on file  Transportation Needs: Not on file  Physical Activity: Not on file  Stress: Not on file  Social Connections: Not on file   Past Surgical History:  Procedure Laterality Date   CESAREAN SECTION     Left elbow surgery     PARTIAL HYSTERECTOMY     TONSILLECTOMY     Past Surgical History:  Procedure Laterality Date   CESAREAN SECTION     Left elbow surgery     PARTIAL HYSTERECTOMY     TONSILLECTOMY  Past Medical History:  Diagnosis Date   Anxiety    Chronic pain    Dr. Letta Pate   Degenerative disc disease    Spinal, small syrinx   Depression    Esophageal stricture    Status post dilatation   Fibromyalgia    GERD (gastroesophageal reflux disease)    Hiatal hernia    Internal hemorrhoids without mention of complication    Irritable bowel syndrome    Palpitations    No documented arrhythmias   Personal history of colonic polyps 10/04/2000   HYPERPLASTIC POLYP   POTS (postural orthostatic tachycardia syndrome)    Syncope    Negative tilt table test   Syringomyelia and syringobulbia (HCC)    Ht 5' 7"  (1.702 m)   Wt 154 lb (69.9 kg)   BMI 24.12 kg/m   Opioid Risk Score:   Fall Risk Score:  `1  Depression screen PHQ  2/9  Depression screen [facility] 2/9 04/30/2021 02/06/2021 01/09/2021 12/09/2020 11/06/2020 07/15/2020 05/15/2020  Decreased Interest 1 1 1  0 0 1 0  Down, Depressed, Hopeless 1 1 1  0 0 1 0  PHQ - 2 Score 2 2 2  0 0 2 0  Altered sleeping - - - - - - -  Tired, decreased energy - - - - - - -  Change in appetite - - - - - - -  Feeling bad or failure about yourself  - - - - - - -  Trouble concentrating - - - - - - -  Moving slowly or fidgety/restless - - - - - - -  Suicidal thoughts - - - - - - -  PHQ-9 Score - - - - - - -  Some recent data might be hidden    Review of Systems  Cardiovascular:  Positive for chest pain.  Musculoskeletal:  Positive for back pain and neck pain.  Neurological:  Positive for headaches.      Objective:   Physical Exam Vitals and nursing note reviewed.  Musculoskeletal:     Comments: No Physical Exam Performed: My Chart Visit         Assessment & Plan:  1. Lumbar degenerative disc disease: 04/30/2021. Continue with current treatment regimen: Refilled : HYDROcodone 7.5/325 mg two tablets every 8 hours as needed #180. We will continue the opioid monitoring program, this consists of regular clinic visits, examinations, urine drug screen, pill counts as well as use of [place] Controlled Substance Reporting system. A 12 month History has been reviewed on the [place] Controlled Substance Reporting System on 04/30/2021  2. Thoracic Spondylosis: Continue current treatment with HEP and  current medication regime. 04/30/2021. 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 Continue to Monitor. 04/30/2021.  Lyrica discontinued due to adverse effects. Continue with exercise and heat therapy. 04/30/2021 4. Fibromyalgia:Continue with Heat and  exercise Regimen. Continue Gabapentin. 04/30/2021  5.Depression and anxiety: PCP Following. 04/30/2021 6. Orthostatic hypotension: Cardiology Following.  04/30/2021 7. Polyarthralgia: Continue to monitor. 04/30/2021 8. Chronic Bilateral  Shoulder Pain:No complaints today. Continue current medication regime. Continue HEP as Tolerated. 09/28//2022. 9. Muscle Spasm:No complaints today.  Continue to monitor.  04/30/2021. 10. Neuropathic Pain: Continue Gabapentin. Continue to Monitor.  04/30/2021  11. Right elbow Pain:No complaints today. Continue current medication regimen. Continue to monitor. 04/30/2021 12. Right  Knee with OA: No complaints today.Continue HEP as Tolerated. Continue current medication regimen. Continue to monitor. 04/30/2021   F/U in 1 month  My-Chart Video Visit Established  Patient Location of Patient: Home Location of Provider: In the Office

## 2021-05-05 ENCOUNTER — Ambulatory Visit: Payer: Medicare Other | Admitting: Registered Nurse

## 2021-05-29 ENCOUNTER — Encounter: Payer: Self-pay | Admitting: Physical Medicine & Rehabilitation

## 2021-05-29 ENCOUNTER — Encounter: Payer: Medicare Other | Attending: Physical Medicine & Rehabilitation | Admitting: Physical Medicine & Rehabilitation

## 2021-05-29 ENCOUNTER — Other Ambulatory Visit: Payer: Self-pay

## 2021-05-29 VITALS — BP 120/88 | HR 101 | Ht 67.0 in | Wt 156.6 lb

## 2021-05-29 DIAGNOSIS — G90A Postural orthostatic tachycardia syndrome (POTS): Secondary | ICD-10-CM | POA: Diagnosis not present

## 2021-05-29 DIAGNOSIS — R519 Headache, unspecified: Secondary | ICD-10-CM | POA: Insufficient documentation

## 2021-05-29 DIAGNOSIS — G8929 Other chronic pain: Secondary | ICD-10-CM | POA: Diagnosis not present

## 2021-05-29 MED ORDER — HYDROCODONE-ACETAMINOPHEN 7.5-325 MG PO TABS: 2.0000 | ORAL_TABLET | Freq: Three times a day (TID) | ORAL | 0 refills | Status: DC | PRN

## 2021-05-29 NOTE — Progress Notes (Signed)
Subjective:    Patient ID: [name], female    DOB: [birth year], 45 y.o.   MRN: [id number]  HPI  Headache 45 yo female with hx of POTS who was referred to evaluate pt for Botox for migraines.  Pt states that prior to diagnosis of POTS , pt was fainting frequently with hx of head trauma but never required stitches or hospitalization  HA is vertex and occipital area, exacerbated by movement .  Has occ radiation to behind the eyes. 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 Chronic issue, has seen Neuro in past for this MRI of Brain 2014 was normal Pt had more intermittent problems in terms of HA in past but has become frequent, now has daily HA pain lasting at least 6hrs per day  No new weakness or numbness in limbs or face Has chronic memory loss complaints, seen by Neuro in the past  Tried topirimate BID without relief Amitriptyline and Fioricet  helps HA , prescribed by Dr Gerarda Fraction On Chronic Hydrocodone for chronic widespread pain Pain Inventory Average Pain 6 Pain Right Now 5 My pain is intermittent, sharp, burning, dull, stabbing, tingling, and aching  In the last 24 hours, has pain interfered with the following? General activity 7 Relation with others 7 Enjoyment of life 7 What TIME of day is your pain at its worst? evening and night Sleep (in general) Fair  Pain is worse with: walking, bending, sitting, inactivity, standing, and some activites Pain improves with: rest, heat/ice, and medication Relief from Meds: 6  Family History  Problem Relation Age of Onset   Diabetes type II Mother    Colon polyps Mother    Diverticulitis Mother    Diabetes Mother    Heart disease Mother    Osteoporosis Father    Diabetes type II Brother    Uterine cancer Other    Ovarian cancer Other    COPD Paternal Grandfather    Heart attack Maternal Grandmother    Social History   Socioeconomic  History   Marital status: Married    Spouse name: Not on file   Number of children: 2   Years of education: Not on file   Highest education level: Not on file  Occupational History   Occupation: Disability    Employer: UNEMPLOYED  Tobacco Use   Smoking status: Former    Types: Cigarettes    Quit date: 06/30/2011    Years since quitting: 9.9   Smokeless tobacco: Never  Vaping Use   Vaping Use: Some days   Substances: Flavoring  Substance and Sexual Activity   Alcohol use: No    Alcohol/week: 0.0 standard drinks   Drug use: No   Sexual activity: Yes  Other Topics Concern   Not on file  Social History Narrative   Not on file   Social Determinants of Health   Financial Resource Strain: Not on file  Food Insecurity: Not on file  Transportation Needs: Not on file  Physical Activity: Not on file  Stress: Not on file  Social Connections: Not on file   Past Surgical History:  Procedure Laterality Date   CESAREAN SECTION     Left elbow surgery     PARTIAL HYSTERECTOMY     TONSILLECTOMY     Past Surgical History:  Procedure Laterality Date   CESAREAN SECTION     Left elbow surgery  PARTIAL HYSTERECTOMY     TONSILLECTOMY     Past Medical History:  Diagnosis Date   Anxiety    Chronic pain    Dr. Letta Pate   Degenerative disc disease    Spinal, small syrinx   Depression    Esophageal stricture    Status post dilatation   Fibromyalgia    GERD (gastroesophageal reflux disease)    Hiatal hernia    Internal hemorrhoids without mention of complication    Irritable bowel syndrome    Palpitations    No documented arrhythmias   Personal history of colonic polyps 10/04/2000   HYPERPLASTIC POLYP   POTS (postural orthostatic tachycardia syndrome)    Syncope    Negative tilt table test   Syringomyelia and syringobulbia (HCC)    Ht 5' 7"  (1.702 m)   Wt 156 lb 9.6 oz (71 kg)   BMI 24.53 kg/m   Opioid Risk Score:   Fall Risk Score:  `1  Depression screen PHQ  2/9  Depression screen [facility] 2/9 05/29/2021 04/30/2021 02/06/2021 01/09/2021 12/09/2020 11/06/2020 07/15/2020  Decreased Interest 2 1 1 1  0 0 1  Down, Depressed, Hopeless 2 1 1 1  0 0 1  PHQ - 2 Score 4 2 2 2  0 0 2  Altered sleeping - - - - - - -  Tired, decreased energy - - - - - - -  Change in appetite - - - - - - -  Feeling bad or failure about yourself  - - - - - - -  Trouble concentrating - - - - - - -  Moving slowly or fidgety/restless - - - - - - -  Suicidal thoughts - - - - - - -  PHQ-9 Score - - - - - - -  Some recent data might be hidden     Review of Systems  Constitutional: Negative.   HENT: Negative.    Eyes: Negative.   Respiratory: Negative.    Cardiovascular: Negative.   Gastrointestinal: Negative.   Endocrine: Negative.   Genitourinary: Negative.   Musculoskeletal:  Positive for back pain.  Skin: Negative.   Allergic/Immunologic: Negative.   Neurological:  Positive for headaches.  Hematological: Negative.   Psychiatric/Behavioral:  Positive for dysphoric mood.       Objective:   Physical Exam Vitals and nursing note reviewed.  Constitutional:      Appearance: She is normal weight.  HENT:     Head: Normocephalic and atraumatic.  Eyes:     Extraocular Movements: Extraocular movements intact.     Conjunctiva/sclera: Conjunctivae normal.     Pupils: Pupils are equal, round, and reactive to light.  Cardiovascular:     Rate and Rhythm: Normal rate and regular rhythm.     Heart sounds: Normal heart sounds. No murmur heard. Pulmonary:     Effort: Pulmonary effort is normal. No respiratory distress.     Breath sounds: Normal breath sounds.  Abdominal:     General: Abdomen is flat. Bowel sounds are normal. There is no distension.     Palpations: Abdomen is soft.  Musculoskeletal:        General: No swelling or tenderness.     Comments: No pain with upper limb or lower limb range of motion with the exception of hip extension on the right side causing some anterior  quadricep pain. Cervical spine range of motion is normal but sidebending does cause some mild pain. There is no tenderness palpation along the temporalis muscle or frontalis muscle.  There is mild tenderness palpation at the base of the skull  Skin:    General: Skin is warm and dry.  Neurological:     Mental Status: She is alert and oriented to person, place, and time.     Cranial Nerves: No dysarthria or facial asymmetry.     Motor: No tremor or abnormal muscle tone.     Coordination: Coordination normal.     Gait: Gait is intact.     Deep Tendon Reflexes:     Reflex Scores:      Tricep reflexes are 2+ on the right side and 2+ on the left side.      Bicep reflexes are 2+ on the right side and 2+ on the left side.      Brachioradialis reflexes are 2+ on the right side and 2+ on the left side.      Patellar reflexes are 2+ on the right side and 2+ on the left side.      Achilles reflexes are 2+ on the right side and 2+ on the left side. Psychiatric:        Mood and Affect: Mood normal.        Behavior: Behavior normal.   Motor strength is 5/5 bilateral deltoid, bicep, tricep, grip, hip flexor, knee extensor, ankle dorsiflexion       Assessment & Plan:  1.  Chronic headaches she has been treated for this in the past she has been tried on topiramate which was not helpful for her Her primary care physician prescribes amitriptyline which is helpful for her and does not seem to cause problems with orthostatic hypotension. She also is prescribed Fioricet which she takes on an as-needed basis. It is unclear whether her headaches are chronic migraine.  She gives a history of frequent falls and minor head trauma on several occasions prior to her diagnosis of postural orthostatic tachycardia syndrome and certainly posttraumatic headaches may occur.  In addition she is on frequent pain medications so rebound headaches also possibility. I have referred her back to Dr. Brett Fairy from neurology to  reassess.  It is not clear whether Botox would be helpful as a prophylactic agent unless she clearly has migraine headaches.  Her headache frequency is daily and duration is at least 6 hours/day

## 2021-05-29 NOTE — Patient Instructions (Signed)
Hip Exercises Ask your health care provider which exercises are safe for you. Do exercises exactly as told by your health care provider and adjust them as directed. It is normal to feel mild stretching, pulling, tightness, or discomfort as you do these exercises. Stop right away if you feel sudden pain or your pain gets worse. Do not begin these exercises until told by your health care provider. Stretching and range-of-motion exercises These exercises warm up your muscles and joints and improve the movement and flexibility of your hip. These exercises also help to relieve pain, numbness, and tingling. You may be asked to limit your range of motion if you had a hip replacement. Talk to your health care provider about these restrictions. Hamstrings, supine  Lie on your back (supine position). Loop a belt or towel over the ball of your left / right foot. The ball of your foot is on the walking surface, right under your toes. Straighten your left / right knee and slowly pull on the belt or towel to raise your leg until you feel a gentle stretch behind your knee (hamstring). Do not let your knee bend while you do this. Keep your other leg flat on the floor. Hold this position for __________ seconds. Slowly return your leg to the starting position. Repeat __________ times. Complete this exercise __________ times a day. Hip rotation  Lie on your back on a firm surface. With your left / right hand, gently pull your left / right knee toward the shoulder that is on the same side of the body. Stop when your knee is pointing toward the ceiling. Hold your left / right ankle with your other hand. Keeping your knee steady, gently pull your left / right ankle toward your other shoulder until you feel a stretch in your buttocks. Keep your hips and shoulders firmly planted while you do this stretch. Hold this position for __________ seconds. Repeat __________ times. Complete this exercise __________ times a  day. Seated stretch This exercise is sometimes called hamstrings and adductors stretch. Sit on the floor with your legs stretched wide. Keep your knees straight during this exercise. Keeping your head and back in a straight line, bend at your waist to reach for your left foot (position A). You should feel a stretch in your right inner thigh (adductors). Hold this position for __________ seconds. Then slowly return to the upright position. Keeping your head and back in a straight line, bend at your waist to reach forward (position B). You should feel a stretch behind both of your thighs and knees (hamstrings). Hold this position for __________ seconds. Then slowly return to the upright position. Keeping your head and back in a straight line, bend at your waist to reach for your right foot (position C). You should feel a stretch in your left inner thigh (adductors). Hold this position for __________ seconds. Then slowly return to the upright position. Repeat __________ times. Complete this exercise __________ times a day. Lunge This exercise stretches the muscles of the hip (hip flexors). Place your left / right knee on the floor and bend your other knee so that is directly over your ankle. You should be half-kneeling. Keep good posture with your head over your shoulders. Tighten your buttocks to point your tailbone downward. This will prevent your back from arching too much. You should feel a gentle stretch in the front of your left / right thigh and hip. If you do not feel a stretch, slide your other foot  forward slightly and then slowly lunge forward with your chest up until your knee once again lines up over your ankle. Make sure your tailbone continues to point downward. Hold this position for __________ seconds. Slowly return to the starting position. Repeat __________ times. Complete this exercise __________ times a day. Strengthening exercises These exercises build strength and endurance  in your hip. Endurance is the ability to use your muscles for a long time, even after they get tired. Bridge This exercise strengthens the muscles of your hip (hip extensors). Lie on your back on a firm surface with your knees bent and your feet flat on the floor. Tighten your buttocks muscles and lift your bottom off the floor until the trunk of your body and your hips are level with your thighs. Do not arch your back. You should feel the muscles working in your buttocks and the back of your thighs. If you do not feel these muscles, slide your feet 1-2 inches (2.5-5 cm) farther away from your buttocks. Hold this position for __________ seconds. Slowly lower your hips to the starting position. Let your muscles relax completely between repetitions. Repeat __________ times. Complete this exercise __________ times a day. Straight leg raises, side-lying This exercise strengthens the muscles that move the hip joint away from the center of the body (hip abductors). Lie on your side with your left / right leg in the top position. Lie so your head, shoulder, hip, and knee line up. You may bend your bottom knee slightly to help you balance. Roll your hips slightly forward, so your hips are stacked directly over each other and your left / right knee is facing forward. Leading with your heel, lift your top leg 4-6 inches (10-15 cm). You should feel the muscles in your top hip lifting. Do not let your foot drift forward. Do not let your knee roll toward the ceiling. Hold this position for __________ seconds. Slowly return to the starting position. Let your muscles relax completely between repetitions. Repeat __________ times. Complete this exercise __________ times a day. Straight leg raises, side-lying This exercise strengthens the muscles that move the hip joint toward the center of the body (hip adductors). Lie on your side with your left / right leg in the bottom position. Lie so your head, shoulder,  hip, and knee line up. You may place your upper foot in front to help you balance. Roll your hips slightly forward, so your hips are stacked directly over each other and your left / right knee is facing forward. Tense the muscles in your inner thigh and lift your bottom leg 4-6 inches (10-15 cm). Hold this position for __________ seconds. Slowly return to the starting position. Let your muscles relax completely between repetitions. Repeat __________ times. Complete this exercise __________ times a day. Straight leg raises, supine This exercise strengthens the muscles in the front of your thigh (quadriceps). Lie on your back (supine position) with your left / right leg extended and your other knee bent. Tense the muscles in the front of your left / right thigh. You should see your kneecap slide up or see increased dimpling just above your knee. Keep these muscles tight as you raise your leg 4-6 inches (10-15 cm) off the floor. Do not let your knee bend. Hold this position for __________ seconds. Keep these muscles tense as you lower your leg. Relax the muscles slowly and completely between repetitions. Repeat __________ times. Complete this exercise __________ times a day. Hip abductors, standing This  exercise strengthens the muscles that move the leg and hip joint away from the center of the body (hip abductors). Tie one end of a rubber exercise band or tubing to a secure surface, such as a chair, table, or pole. Loop the other end of the band or tubing around your left / right ankle. Keeping your ankle with the band or tubing directly opposite the secured end, step away until there is tension in the tubing or band. Hold on to a chair, table, or pole as needed for balance. Lift your left / right leg out to your side. While you do this: Keep your back upright. Keep your shoulders over your hips. Keep your toes pointing forward. Make sure to use your hip muscles to slowly lift your leg. Do not  tip your body or forcefully lift your leg. Hold this position for __________ seconds. Slowly return to the starting position. Repeat __________ times. Complete this exercise __________ times a day. Squats This exercise strengthens the muscles in the front of your thigh (quadriceps). Stand in a door frame so your feet and knees are in line with the frame. You may place your hands on the frame for balance. Slowly bend your knees and lower your hips like you are going to sit in a chair. Keep your lower legs in a straight-up-and-down position. Do not let your hips go lower than your knees. Do not bend your knees lower than told by your health care provider. If your hip pain increases, do not bend as low. Hold this position for ___________ seconds. Slowly push with your legs to return to standing. Do not use your hands to pull yourself to standing. Repeat __________ times. Complete this exercise __________ times a day. This information is not intended to replace advice given to you by your health care provider. Make sure you discuss any questions you have with your health care provider. Document Revised: 02/23/2019 Document Reviewed: 05/31/2018 Elsevier Patient Education  2022 [employer].

## 2021-06-05 DIAGNOSIS — E063 Autoimmune thyroiditis: Secondary | ICD-10-CM | POA: Diagnosis not present

## 2021-06-05 DIAGNOSIS — Z6824 Body mass index (BMI) 24.0-24.9, adult: Secondary | ICD-10-CM | POA: Diagnosis not present

## 2021-06-05 DIAGNOSIS — B029 Zoster without complications: Secondary | ICD-10-CM | POA: Diagnosis not present

## 2021-06-19 ENCOUNTER — Encounter: Payer: Medicare Other | Admitting: Registered Nurse

## 2021-06-23 ENCOUNTER — Encounter: Payer: Medicare Other | Attending: Physical Medicine & Rehabilitation | Admitting: Registered Nurse

## 2021-06-23 ENCOUNTER — Encounter: Payer: Self-pay | Admitting: Registered Nurse

## 2021-06-23 ENCOUNTER — Other Ambulatory Visit: Payer: Self-pay

## 2021-06-23 VITALS — BP 135/86 | HR 91 | Ht 67.0 in | Wt 156.0 lb

## 2021-06-23 DIAGNOSIS — R519 Headache, unspecified: Secondary | ICD-10-CM | POA: Insufficient documentation

## 2021-06-23 DIAGNOSIS — M792 Neuralgia and neuritis, unspecified: Secondary | ICD-10-CM | POA: Diagnosis not present

## 2021-06-23 DIAGNOSIS — M2550 Pain in unspecified joint: Secondary | ICD-10-CM | POA: Diagnosis not present

## 2021-06-23 DIAGNOSIS — G8929 Other chronic pain: Secondary | ICD-10-CM | POA: Insufficient documentation

## 2021-06-23 DIAGNOSIS — M797 Fibromyalgia: Secondary | ICD-10-CM | POA: Diagnosis not present

## 2021-06-23 DIAGNOSIS — M4716 Other spondylosis with myelopathy, lumbar region: Secondary | ICD-10-CM | POA: Insufficient documentation

## 2021-06-23 DIAGNOSIS — M546 Pain in thoracic spine: Secondary | ICD-10-CM | POA: Diagnosis not present

## 2021-06-23 DIAGNOSIS — M545 Low back pain, unspecified: Secondary | ICD-10-CM | POA: Diagnosis not present

## 2021-06-23 DIAGNOSIS — M542 Cervicalgia: Secondary | ICD-10-CM | POA: Insufficient documentation

## 2021-06-23 MED ORDER — HYDROCODONE-ACETAMINOPHEN 7.5-325 MG PO TABS: 2.0000 | ORAL_TABLET | Freq: Three times a day (TID) | ORAL | 0 refills | Status: DC | PRN

## 2021-06-23 NOTE — Progress Notes (Signed)
Subjective:    Patient ID: [name], female    DOB: [birth year], 45 y.o.   MRN: [id number]  HPI: [name] is a 45 y.o. female who returns for follow up appointment for chronic pain and medication refill. She states her pain is located in her  head, she awaken with a headache this morning, neck, mid- lower back and right knee pain. She also reports bilateral hand pain with tingling and generalized joint pain.. She rates her pain 7. Her current exercise regime is walking and performing stretching exercises.  [name] equivalent is 45.00 MME.       Last UDS was Performed on 03/2021 it was consistent.     Pain Inventory Average Pain 6 Pain Right Now 7 My pain is intermittent, sharp, burning, dull, stabbing, tingling, and aching  In the last 24 hours, has pain interfered with the following? General activity 7 Relation with others 7 Enjoyment of life 7 What TIME of day is your pain at its worst? morning , evening, and night Sleep (in general) Fair  Pain is worse with: walking, bending, sitting, inactivity, standing, and unsure Pain improves with: rest, heat/ice, and medication Relief from Meds: 6  Family History  Problem Relation Age of Onset   Diabetes type II Mother    Colon polyps Mother    Diverticulitis Mother    Diabetes Mother    Heart disease Mother    Osteoporosis Father    Diabetes type II Brother    Uterine cancer Other    Ovarian cancer Other    COPD Paternal Grandfather    Heart attack Maternal Grandmother    Social History   Socioeconomic History   Marital status: Married    Spouse name: Not on file   Number of children: 2   Years of education: Not on file   Highest education level: Not on file  Occupational History   Occupation: Disability    Employer: UNEMPLOYED  Tobacco Use   Smoking status: Former    Types: Cigarettes    Quit date: 06/30/2011    Years since quitting: 9.9   Smokeless tobacco: Never  Vaping Use   Vaping  Use: Some days   Substances: Flavoring  Substance and Sexual Activity   Alcohol use: No    Alcohol/week: 0.0 standard drinks   Drug use: No   Sexual activity: Yes  Other Topics Concern   Not on file  Social History Narrative   Not on file   Social Determinants of Health   Financial Resource Strain: Not on file  Food Insecurity: Not on file  Transportation Needs: Not on file  Physical Activity: Not on file  Stress: Not on file  Social Connections: Not on file   Past Surgical History:  Procedure Laterality Date   CESAREAN SECTION     Left elbow surgery     PARTIAL HYSTERECTOMY     TONSILLECTOMY     Past Surgical History:  Procedure Laterality Date   CESAREAN SECTION     Left elbow surgery     PARTIAL HYSTERECTOMY     TONSILLECTOMY     Past Medical History:  Diagnosis Date   Anxiety    Chronic pain    Dr. Letta Pate   Degenerative disc disease    Spinal, small syrinx   Depression    Esophageal stricture    Status post dilatation   Fibromyalgia    GERD (gastroesophageal reflux disease)    Hiatal hernia  Internal hemorrhoids without mention of complication    Irritable bowel syndrome    Palpitations    No documented arrhythmias   Personal history of colonic polyps 10/04/2000   HYPERPLASTIC POLYP   POTS (postural orthostatic tachycardia syndrome)    Syncope    Negative tilt table test   Syringomyelia and syringobulbia (HCC)    Ht 5' 7"  (1.702 m)   Wt 156 lb (70.8 kg)   BMI 24.43 kg/m   Opioid Risk Score:   Fall Risk Score:  `1  Depression screen PHQ 2/9  Depression screen [facility] 2/9 06/23/2021 05/29/2021 04/30/2021 02/06/2021 01/09/2021 12/09/2020 11/06/2020  Decreased Interest 3 2 1 1 1  0 0  Down, Depressed, Hopeless 3 2 1 1 1  0 0  PHQ - 2 Score 6 4 2 2 2  0 0  Altered sleeping 1 - - - - - -  Tired, decreased energy 3 - - - - - -  Change in appetite 3 - - - - - -  Feeling bad or failure about yourself  1 - - - - - -  Trouble concentrating 3 - - - - - -   Moving slowly or fidgety/restless 1 - - - - - -  Suicidal thoughts 0 - - - - - -  PHQ-9 Score 18 - - - - - -  Some recent data might be hidden     Review of Systems  Constitutional: Negative.   HENT: Negative.    Eyes: Negative.   Respiratory: Negative.    Cardiovascular: Negative.   Gastrointestinal: Negative.   Endocrine: Negative.   Genitourinary: Negative.   Musculoskeletal:  Positive for back pain and neck pain.  Skin: Negative.   Allergic/Immunologic: Negative.   Neurological:  Positive for numbness and headaches.  Hematological: Negative.   Psychiatric/Behavioral:  Positive for dysphoric mood. The patient is nervous/anxious.       Objective:   Physical Exam Vitals and nursing note reviewed.  Constitutional:      Appearance: Normal appearance.  Cardiovascular:     Rate and Rhythm: Normal rate and regular rhythm.     Pulses: Normal pulses.     Heart sounds: Normal heart sounds.  Pulmonary:     Effort: Pulmonary effort is normal.     Breath sounds: Normal breath sounds.  Musculoskeletal:     Cervical back: Normal range of motion and neck supple.     Comments: Normal Muscle Bulk and Muscle Testing Reveals:  Upper Extremities: Full ROM and Muscle Strength 5/5 Bilateral AC Joint Tenderness Lower Extremities: Full ROM and Muscle Strength 5/5 Arises from Table with ease Narrow Based  Gait     Skin:    General: Skin is warm and dry.  Neurological:     Mental Status: She is alert and oriented to person, place, and time.  Psychiatric:        Mood and Affect: Mood normal.        Behavior: Behavior normal.         Assessment & Plan:  1. Lumbar degenerative disc disease: 06/23/2021. Continue with current treatment regimen: Refilled : HYDROcodone 7.5/325 mg two tablets every 8 hours as needed #180. We will continue the opioid monitoring program, this consists of regular clinic visits, examinations, urine drug screen, pill counts as well as use of [place]  Controlled Substance Reporting system. A 12 month History has been reviewed on the [place] Controlled Substance Reporting System on 06/23/2021  2. Thoracic Spondylosis: Continue current treatment with  HEP and  current medication regime. 06/23/2021. 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 Continue to Monitor. 06/23/2021.  Lyrica discontinued due to adverse effects. Continue with exercise and heat therapy. 06/23/2021 4. Fibromyalgia:Continue with Heat and  exercise Regimen. Continue Gabapentin. 06/23/2021  5.Depression and anxiety: PCP Following. 06/23/2021 6. Orthostatic hypotension: Cardiology Following. 06/24/2021 7. Polyarthralgia: Continue to monitor. 06/23/2021 8. Chronic Bilateral  Shoulder Pain: Continue current medication regime. Continue HEP as Tolerated. 11/21//2022. 9. Muscle Spasm:No complaints today.  Continue to monitor.  06/23/2021. 10. Neuropathic Pain: Continue Gabapentin. Continue to Monitor.  06/23/2021  11. Right elbow Pain: Continue current medication regimen. Continue to monitor. 06/23/2021 12. Right  Knee with OA: No complaints today.Continue HEP as Tolerated. Continue current medication regimen. Continue to monitor. 06/23/2021   F/U in 1 month

## 2021-06-25 DIAGNOSIS — E063 Autoimmune thyroiditis: Secondary | ICD-10-CM | POA: Diagnosis not present

## 2021-06-25 DIAGNOSIS — I10 Essential (primary) hypertension: Secondary | ICD-10-CM | POA: Diagnosis not present

## 2021-06-25 DIAGNOSIS — L719 Rosacea, unspecified: Secondary | ICD-10-CM | POA: Diagnosis not present

## 2021-07-01 IMAGING — MR MR CERVICAL SPINE W/O CM
4 of 5 series · 21 of 48 positions shown · non-contrast
Comparison: Cervical spine MRI 09/25/2009, and reports of earlier
MRIs.

CLINICAL DATA: 43-year-old female with chronic neck pain radiating
down both arms. History of syrinx.

EXAM:
MRI CERVICAL SPINE WITHOUT CONTRAST
TECHNIQUE: Multiplanar, multisequence MR imaging of the cervical spine was
performed. No intravenous contrast was administered.

[Series 3: T2 · sagittal · 3.0mm · 0.56mm/px · 6 of 15 slices shown (1 of 2)]
[im 1/15]
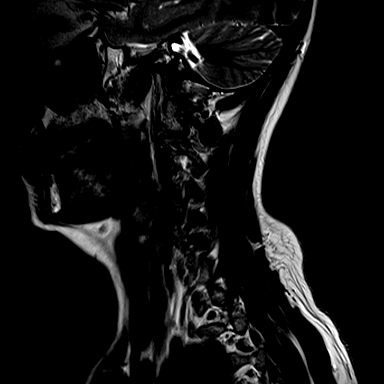
[im 3/15]
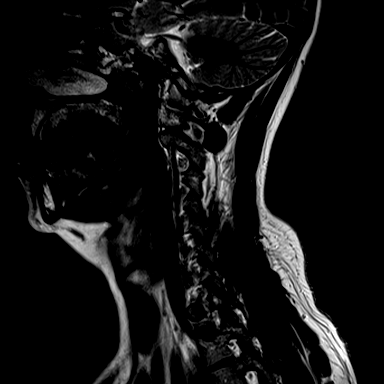
[im 6/15]
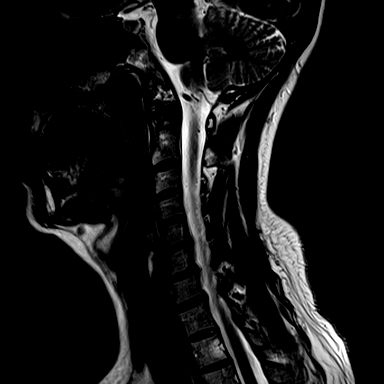
[im 9/15]
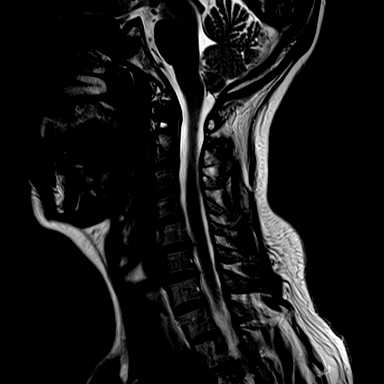
[im 12/15]
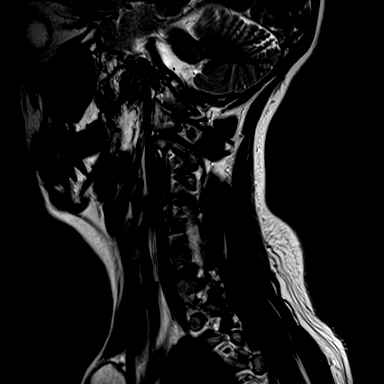
[im 15/15]
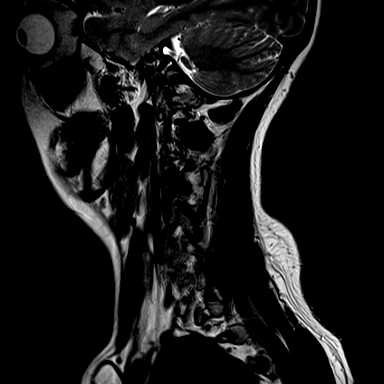

[Series 4: FLAIR · sagittal · 3.0mm · 0.67mm/px · 3 of 15 slices shown]
[im 3/15]
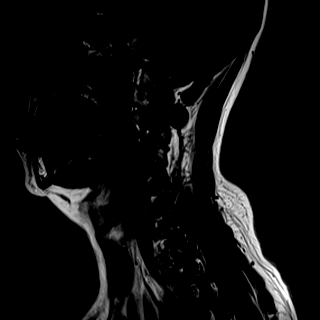
[im 8/15]
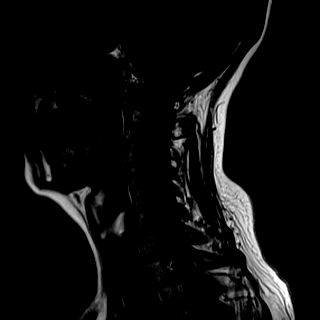
[im 12/15]
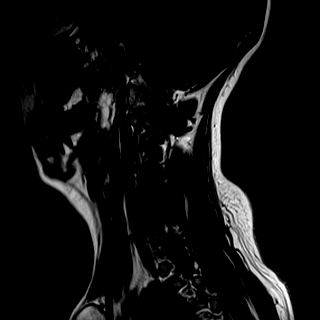

[Series 5: STIR · sagittal · 3.0mm · 0.34mm/px · 4 of 15 slices shown]
[im 1/15]
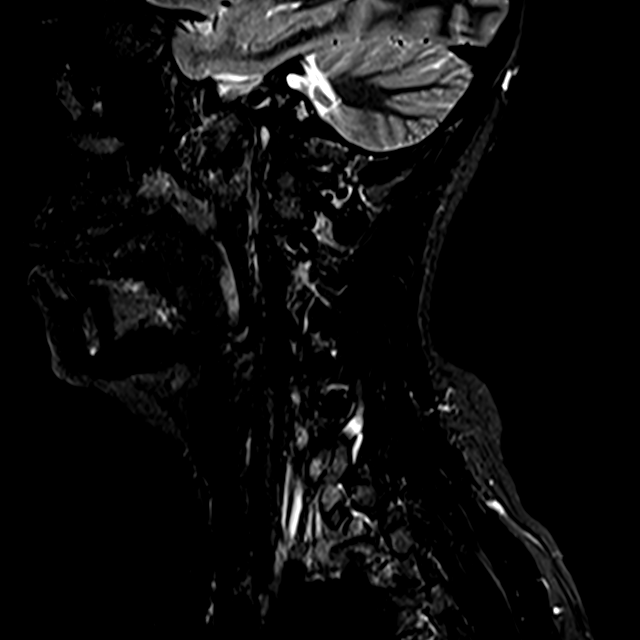
[im 3/15]
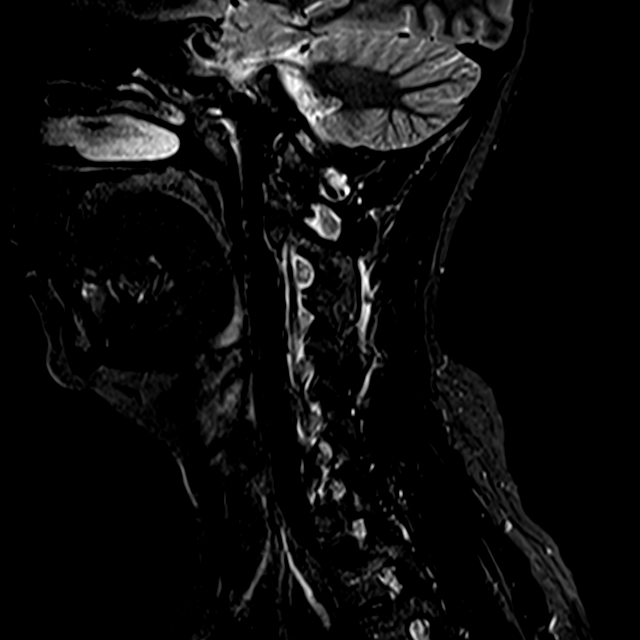
[im 8/15]
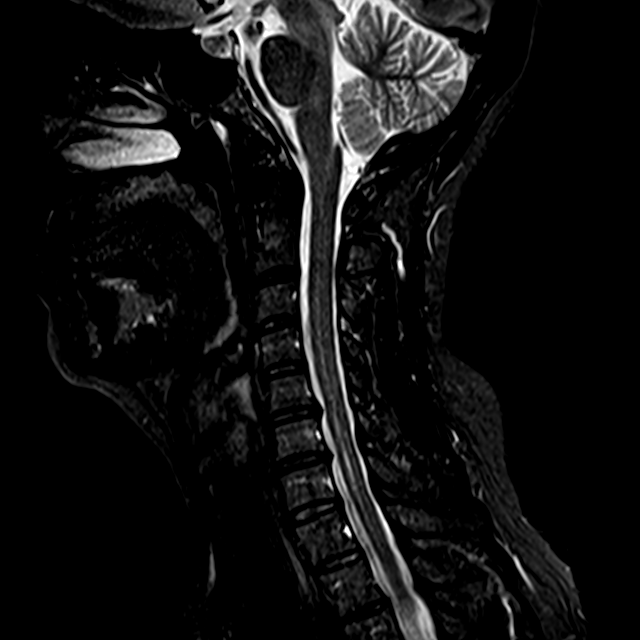
[im 12/15]
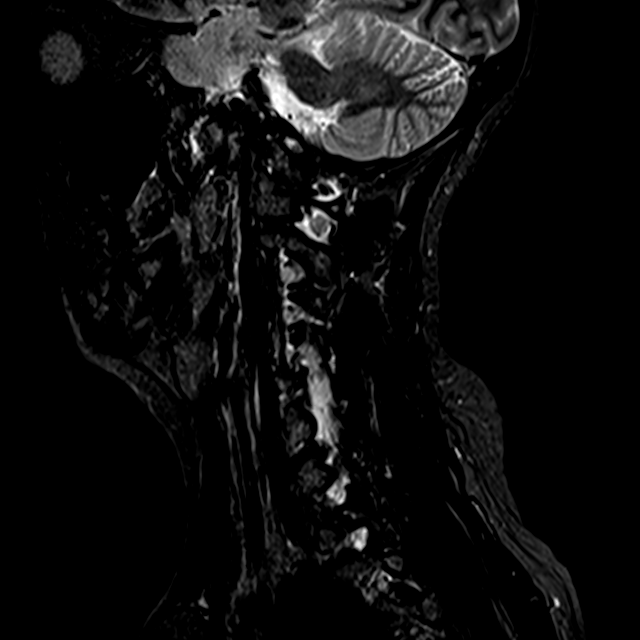

[Series 6: T2 · axial · 3.0mm · 0.23mm/px · z∈[-60,+39]mm · 8 of 32 slices shown (2 of 2)]
[im 1/32]
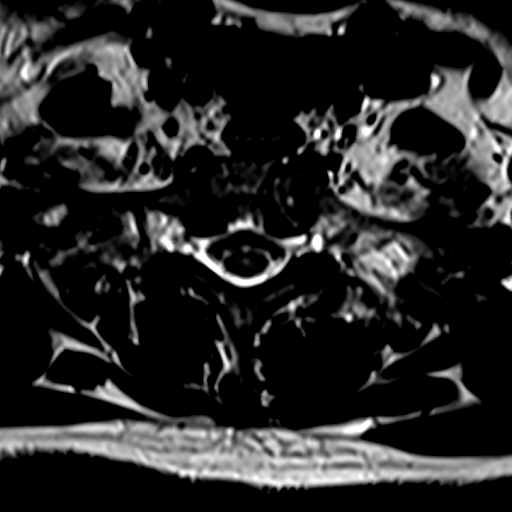
[im 5/32]
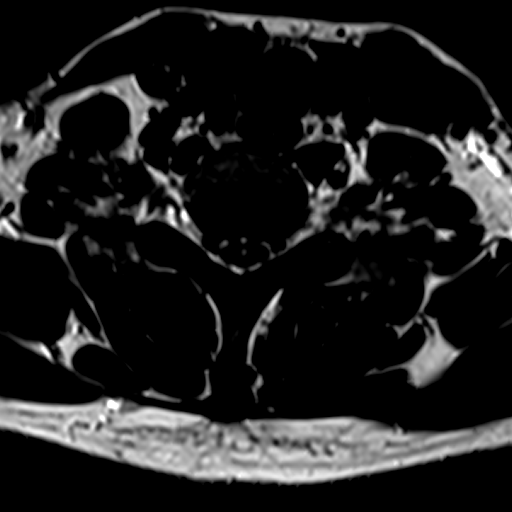
[im 10/32]
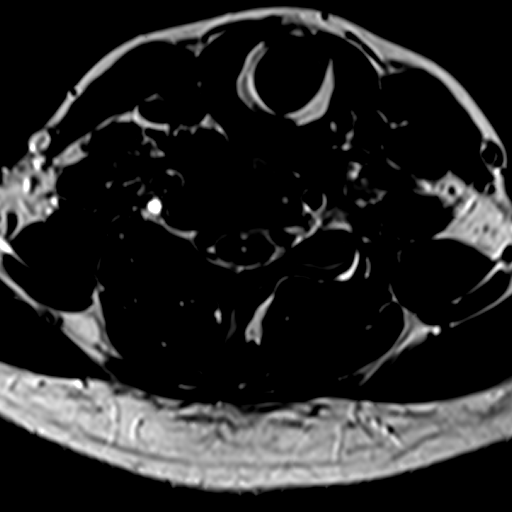
[im 15/32]
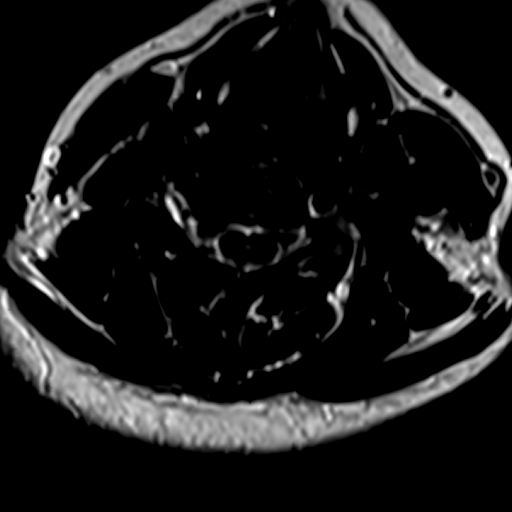
[im 17/32]
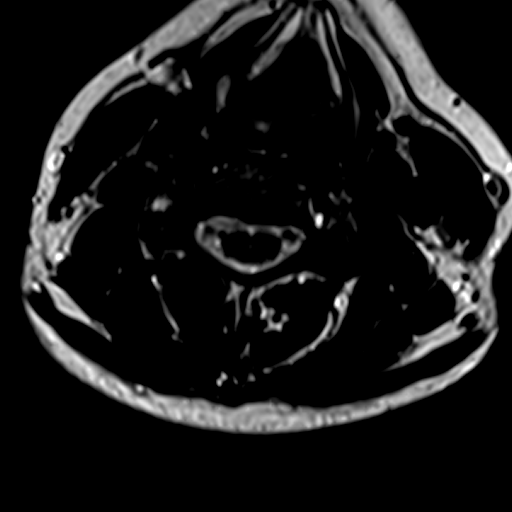
[im 22/32]
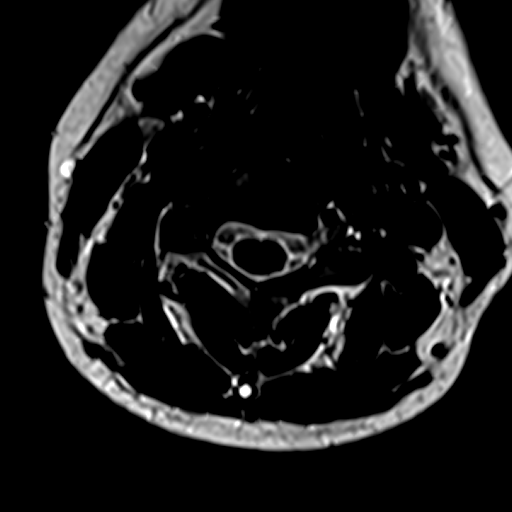
[im 27/32]
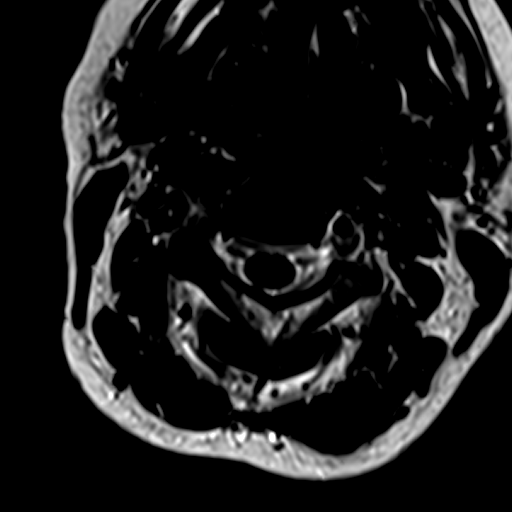
[im 32/32]
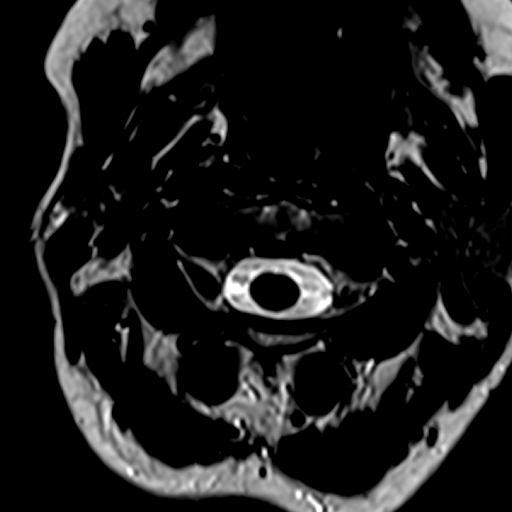

[21 of 48 positions shown; findings below may reference images not displayed]

FINDINGS: Alignment: Mild straightening of cervical lordosis compared to 2199.
No spondylolisthesis.

Vertebrae: No marrow edema or evidence of acute osseous abnormality.
Visualized bone marrow signal is within normal limits.

Cord: Chronic small up to 1 millimeter area of central spinal cord
increased T2 signal is most pronounced at C6-C7 and stable allowing
for differences in technique since 2199. As before this is not
evident on sagittal T1 imaging. Otherwise normal visible spinal cord
signal and morphology.

Posterior Fossa, vertebral arteries, paraspinal tissues:
Cervicomedullary junction is within normal limits. Negative visible
brain parenchyma. Preserved major vascular flow voids in the neck,
the left vertebral artery appears mildly dominant. Negative visible
neck soft tissues and lung apices.

Disc levels:

C2-C3:  Negative.

C3-C4:  Negative.

C4-C5:  Negative.

C5-C6: Chronic disc bulging with mild endplate spurring. No spinal
stenosis. Borderline to mild left C6 foraminal stenosis.

C6-C7: Chronic right eccentric disc bulging with mild endplate
spurring. Broad-based right foraminal component of disc seen on
series 3, image 4 and series 6, image 23 appears increased since
has substantially increased.

C7-T1: Foraminal endplate spurring and mild to moderate facet and
ligament flavum hypertrophy. Increased mild bilateral C8 foraminal
stenosis.

No upper thoracic stenosis. T2-T3 facet hypertrophy is noted.
IMPRESSION: 1. Rightward disc degeneration at C6-C7 has progressed since 2199
now with moderate right side foraminal stenosis. Query Right C7
radiculitis.
2. No cervical spinal stenosis. Stable disc bulging at C5-C6 with up
to mild left foraminal stenosis. Increased mild bilateral foraminal
stenosis at C7-T1.
3. Stable since 2199 small chronic cervical spinal cord syrinx
versus prominence of the central spinal canal (normal variant).

## 2021-07-21 ENCOUNTER — Other Ambulatory Visit: Payer: Self-pay

## 2021-07-21 ENCOUNTER — Other Ambulatory Visit: Payer: Self-pay | Admitting: Registered Nurse

## 2021-07-21 ENCOUNTER — Encounter: Payer: Medicare Other | Attending: Physical Medicine & Rehabilitation | Admitting: Registered Nurse

## 2021-07-21 ENCOUNTER — Encounter: Payer: Self-pay | Admitting: Registered Nurse

## 2021-07-21 VITALS — BP 135/86 | HR 91 | Ht 67.0 in | Wt 156.0 lb

## 2021-07-21 DIAGNOSIS — R519 Headache, unspecified: Secondary | ICD-10-CM | POA: Diagnosis not present

## 2021-07-21 DIAGNOSIS — G894 Chronic pain syndrome: Secondary | ICD-10-CM | POA: Diagnosis not present

## 2021-07-21 DIAGNOSIS — M546 Pain in thoracic spine: Secondary | ICD-10-CM | POA: Diagnosis not present

## 2021-07-21 DIAGNOSIS — G8929 Other chronic pain: Secondary | ICD-10-CM

## 2021-07-21 DIAGNOSIS — Z5181 Encounter for therapeutic drug level monitoring: Secondary | ICD-10-CM

## 2021-07-21 DIAGNOSIS — M47814 Spondylosis without myelopathy or radiculopathy, thoracic region: Secondary | ICD-10-CM | POA: Diagnosis not present

## 2021-07-21 DIAGNOSIS — M792 Neuralgia and neuritis, unspecified: Secondary | ICD-10-CM | POA: Diagnosis not present

## 2021-07-21 DIAGNOSIS — M545 Low back pain, unspecified: Secondary | ICD-10-CM | POA: Diagnosis not present

## 2021-07-21 DIAGNOSIS — M542 Cervicalgia: Secondary | ICD-10-CM | POA: Insufficient documentation

## 2021-07-21 DIAGNOSIS — M4716 Other spondylosis with myelopathy, lumbar region: Secondary | ICD-10-CM | POA: Diagnosis not present

## 2021-07-21 DIAGNOSIS — Z79899 Other long term (current) drug therapy: Secondary | ICD-10-CM | POA: Diagnosis not present

## 2021-07-21 DIAGNOSIS — M797 Fibromyalgia: Secondary | ICD-10-CM

## 2021-07-21 DIAGNOSIS — M2550 Pain in unspecified joint: Secondary | ICD-10-CM | POA: Diagnosis not present

## 2021-07-21 MED ORDER — GABAPENTIN 100 MG PO CAPS: ORAL_CAPSULE | ORAL | 3 refills | Status: DC

## 2021-07-21 NOTE — Progress Notes (Signed)
Subjective:    Patient ID: [name], female    DOB: Sep 19, 1975, 45 y.o.   MRN: [id number]  HPI: [name] is a 45 y.o. female whose appointment was changed to My-Chart Video Visit, [name] reports she tested positive for COVID-19 last week and two weeks ago she was diagnosed with the Flu. She agrees with My-Chart Video Visit. She states her pain is located in her  neck, mid- lower back pain and reports bilateral hands with tingling and numbness.He rates his pain 6. His current exercise regime is walking and performing stretching exercises.  [name] Morphine equivalent is 45.00 MME.  She is also prescribed Clonazepam last refill waso on 04/22/022 .We have discussed the black box warning of using opioids and benzodiazepines. I highlighted the dangers of using these drugs together and discussed the adverse events including respiratory suppression, overdose, cognitive impairment and importance of compliance with current regimen. We will continue to monitor and adjust as indicated.   Last UDS was Performed on 03/06/2021, it was consistent.       Pain Inventory Average Pain 7 Pain Right Now 6 My pain is intermittent, sharp, burning, dull, stabbing, tingling, and aching  In the last 24 hours, has pain interfered with the following? General activity 8 Relation with others 8 Enjoyment of life 8 What TIME of day is your pain at its worst? morning  and night Sleep (in general) Fair  Pain is worse with: walking, bending, sitting, inactivity, standing, and some activites Pain improves with: heat/ice, therapy/exercise, and medication Relief from Meds: 6  Family History  Problem Relation Age of Onset   Diabetes type II Mother    Colon polyps Mother    Diverticulitis Mother    Diabetes Mother    Heart disease Mother    Osteoporosis Father    Diabetes type II Brother    Uterine cancer Other    Ovarian cancer Other    COPD Paternal Grandfather    Heart attack Maternal  Grandmother    Social History   Socioeconomic History   Marital status: Married    Spouse name: Not on file   Number of children: 2   Years of education: Not on file   Highest education level: Not on file  Occupational History   Occupation: Disability    Employer: UNEMPLOYED  Tobacco Use   Smoking status: Former    Types: Cigarettes    Quit date: 06/30/2011    Years since quitting: 10.0   Smokeless tobacco: Never  Vaping Use   Vaping Use: Some days   Substances: Flavoring  Substance and Sexual Activity   Alcohol use: No    Alcohol/week: 0.0 standard drinks   Drug use: No   Sexual activity: Yes  Other Topics Concern   Not on file  Social History Narrative   Not on file   Social Determinants of Health   Financial Resource Strain: Not on file  Food Insecurity: Not on file  Transportation Needs: Not on file  Physical Activity: Not on file  Stress: Not on file  Social Connections: Not on file   Past Surgical History:  Procedure Laterality Date   CESAREAN SECTION     Left elbow surgery     PARTIAL HYSTERECTOMY     TONSILLECTOMY     Past Surgical History:  Procedure Laterality Date   CESAREAN SECTION     Left elbow surgery     PARTIAL HYSTERECTOMY     TONSILLECTOMY  Past Medical History:  Diagnosis Date   Anxiety    Chronic pain    Dr. Letta Pate   Degenerative disc disease    Spinal, small syrinx   Depression    Esophageal stricture    Status post dilatation   Fibromyalgia    GERD (gastroesophageal reflux disease)    Hiatal hernia    Internal hemorrhoids without mention of complication    Irritable bowel syndrome    Palpitations    No documented arrhythmias   Personal history of colonic polyps 10/04/2000   HYPERPLASTIC POLYP   POTS (postural orthostatic tachycardia syndrome)    Syncope    Negative tilt table test   Syringomyelia and syringobulbia (HCC)    There were no vitals taken for this visit.  Opioid Risk Score:   Fall Risk Score:   `1  Depression screen PHQ 2/9  Depression screen [facility] 2/9 07/21/2021 06/23/2021 05/29/2021 04/30/2021 02/06/2021 01/09/2021 12/09/2020  Decreased Interest 0 3 2 1 1 1  0  Down, Depressed, Hopeless 0 3 2 1 1 1  0  PHQ - 2 Score 0 6 4 2 2 2  0  Altered sleeping - 1 - - - - -  Tired, decreased energy - 3 - - - - -  Change in appetite - 3 - - - - -  Feeling bad or failure about yourself  - 1 - - - - -  Trouble concentrating - 3 - - - - -  Moving slowly or fidgety/restless - 1 - - - - -  Suicidal thoughts - 0 - - - - -  PHQ-9 Score - 18 - - - - -  Some recent data might be hidden     Review of Systems  Constitutional: Negative.   HENT: Negative.    Eyes: Negative.   Respiratory: Negative.    Cardiovascular: Negative.   Gastrointestinal: Negative.   Endocrine: Negative.   Genitourinary: Negative.   Musculoskeletal:  Positive for back pain.  Skin: Negative.   Allergic/Immunologic: Negative.   Neurological:  Positive for dizziness, weakness and numbness.  Hematological: Negative.   Psychiatric/Behavioral:  Positive for sleep disturbance.       Objective:   Physical Exam Vitals and nursing note reviewed.  Musculoskeletal:     Comments: No Physical Exam Performed: My-Chart Video Visit         Assessment & Plan:  1. Lumbar degenerative disc disease: 07/21/2021. Continue with current treatment regimen: Refilled : HYDROcodone 7.5/325 mg two tablets every 8 hours as needed #180. We will continue the opioid monitoring program, this consists of regular clinic visits, examinations, urine drug screen, pill counts as well as use of [place] Controlled Substance Reporting system. A 12 month History has been reviewed on the [place] Controlled Substance Reporting System on 07/21/2021  2. Thoracic Spondylosis: Continue current treatment with HEP and  current medication regime. 07/21/2021. 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 Continue to Monitor. 07/21/2021.  Lyrica discontinued due to adverse effects. Continue with exercise and heat therapy. 07/21/2021 4. Fibromyalgia:Continue with Heat and  exercise Regimen. Continue Gabapentin. 07/21/2021  5.Depression and anxiety: PCP Following. 07/21/2021 6. Orthostatic hypotension: Cardiology Following. 07/21/2021 7. Polyarthralgia: Continue to monitor. 12/192022 8. Chronic Bilateral  Shoulder Pain: Continue current medication regime. Continue HEP as Tolerated. 12/19//2022. 9. Muscle Spasm:No complaints today.  Continue to monitor.  07/21/2021. 10. Neuropathic Pain: Continue Gabapentin. Continue to Monitor.  07/21/2021  11. Right elbow Pain: Continue current medication regimen. Continue  to monitor. 07/21/2021 12. Right  Knee with OA: No complaints today.Continue HEP as Tolerated. Continue current medication regimen. Continue to monitor. 07/21/2021   F/U in 1 month  My- Chart Video Visit Established Patient Location of Patient: In Her Home Location of Provider: In he Office

## 2021-07-25 ENCOUNTER — Other Ambulatory Visit: Payer: Self-pay | Admitting: Registered Nurse

## 2021-07-29 ENCOUNTER — Telehealth: Payer: Self-pay | Admitting: Registered Nurse

## 2021-07-29 MED ORDER — HYDROCODONE-ACETAMINOPHEN 7.5-325 MG PO TABS: 2.0000 | ORAL_TABLET | Freq: Three times a day (TID) | ORAL | 0 refills | Status: DC | PRN

## 2021-07-29 NOTE — Telephone Encounter (Signed)
PMP was Reviewed.  Hydrocodone e-scribed today.  [name] is aware via My-Chart message.

## 2021-08-25 ENCOUNTER — Other Ambulatory Visit: Payer: Self-pay

## 2021-08-25 ENCOUNTER — Encounter: Payer: Self-pay | Admitting: Registered Nurse

## 2021-08-25 ENCOUNTER — Encounter: Payer: Medicare Other | Attending: Physical Medicine & Rehabilitation | Admitting: Registered Nurse

## 2021-08-25 VITALS — BP 130/83 | HR 93 | Temp 98.4°F | Ht 67.0 in | Wt 156.0 lb

## 2021-08-25 DIAGNOSIS — M546 Pain in thoracic spine: Secondary | ICD-10-CM | POA: Diagnosis not present

## 2021-08-25 DIAGNOSIS — M4716 Other spondylosis with myelopathy, lumbar region: Secondary | ICD-10-CM | POA: Insufficient documentation

## 2021-08-25 DIAGNOSIS — Z79899 Other long term (current) drug therapy: Secondary | ICD-10-CM | POA: Diagnosis not present

## 2021-08-25 DIAGNOSIS — M2550 Pain in unspecified joint: Secondary | ICD-10-CM | POA: Diagnosis not present

## 2021-08-25 DIAGNOSIS — M47814 Spondylosis without myelopathy or radiculopathy, thoracic region: Secondary | ICD-10-CM | POA: Diagnosis not present

## 2021-08-25 DIAGNOSIS — Z5181 Encounter for therapeutic drug level monitoring: Secondary | ICD-10-CM | POA: Insufficient documentation

## 2021-08-25 DIAGNOSIS — M545 Low back pain, unspecified: Secondary | ICD-10-CM | POA: Insufficient documentation

## 2021-08-25 DIAGNOSIS — M797 Fibromyalgia: Secondary | ICD-10-CM | POA: Diagnosis not present

## 2021-08-25 DIAGNOSIS — M792 Neuralgia and neuritis, unspecified: Secondary | ICD-10-CM | POA: Diagnosis not present

## 2021-08-25 DIAGNOSIS — M542 Cervicalgia: Secondary | ICD-10-CM | POA: Diagnosis not present

## 2021-08-25 DIAGNOSIS — G894 Chronic pain syndrome: Secondary | ICD-10-CM | POA: Diagnosis not present

## 2021-08-25 DIAGNOSIS — G8929 Other chronic pain: Secondary | ICD-10-CM | POA: Insufficient documentation

## 2021-08-25 MED ORDER — HYDROCODONE-ACETAMINOPHEN 7.5-325 MG PO TABS: 2.0000 | ORAL_TABLET | Freq: Three times a day (TID) | ORAL | 0 refills | Status: DC | PRN

## 2021-08-25 NOTE — Progress Notes (Signed)
Subjective:    Patient ID: [name], female    DOB: [birth year], 46 y.o.   MRN: [id number]  HQR:[id number] S [name] is a 46 y.o. female who returns for follow up appointment for chronic pain and medication refill. She states her pain is located in her neck, mid-lower back pain ( she reports her pain is deep pain). Also reports bilateral hand pain with tingling and burning and reports generalized joint pain. She rates her pain 5. Her current exercise regime is walking and performing stretching exercises.  [name] Morphine equivalent is 45.00 MME.   UDS was Performed today.    Pain Inventory Average Pain 6 Pain Right Now 5 My pain is intermittent, sharp, burning, dull, stabbing, tingling, and aching  In the last 24 hours, has pain interfered with the following? General activity 6 Relation with others 6 Enjoyment of life 6 What TIME of day is your pain at its worst? morning  and night Sleep (in general) Fair  Pain is worse with: walking, bending, sitting, inactivity, standing, and unsure Pain improves with: rest, heat/ice, and medication Relief from Meds: 6  Family History  Problem Relation Age of Onset   Diabetes type II Mother    Colon polyps Mother    Diverticulitis Mother    Diabetes Mother    Heart disease Mother    Osteoporosis Father    Diabetes type II Brother    Uterine cancer Other    Ovarian cancer Other    COPD Paternal Grandfather    Heart attack Maternal Grandmother    Social History   Socioeconomic History   Marital status: Married    Spouse name: Not on file   Number of children: 2   Years of education: Not on file   Highest education level: Not on file  Occupational History   Occupation: Disability    Employer: UNEMPLOYED  Tobacco Use   Smoking status: Former    Types: Cigarettes    Quit date: 06/30/2011    Years since quitting: 10.1   Smokeless tobacco: Never  Vaping Use   Vaping Use: Some days   Substances: Flavoring  Substance and  Sexual Activity   Alcohol use: No    Alcohol/week: 0.0 standard drinks   Drug use: No   Sexual activity: Yes  Other Topics Concern   Not on file  Social History Narrative   Not on file   Social Determinants of Health   Financial Resource Strain: Not on file  Food Insecurity: Not on file  Transportation Needs: Not on file  Physical Activity: Not on file  Stress: Not on file  Social Connections: Not on file   Past Surgical History:  Procedure Laterality Date   CESAREAN SECTION     Left elbow surgery     PARTIAL HYSTERECTOMY     TONSILLECTOMY     Past Surgical History:  Procedure Laterality Date   CESAREAN SECTION     Left elbow surgery     PARTIAL HYSTERECTOMY     TONSILLECTOMY     Past Medical History:  Diagnosis Date   Anxiety    Chronic pain    Dr. Letta Pate   Degenerative disc disease    Spinal, small syrinx   Depression    Esophageal stricture    Status post dilatation   Fibromyalgia    GERD (gastroesophageal reflux disease)    Hiatal hernia    Internal hemorrhoids without mention of complication    Irritable bowel syndrome  Palpitations    No documented arrhythmias   Personal history of colonic polyps 10/04/2000   HYPERPLASTIC POLYP   POTS (postural orthostatic tachycardia syndrome)    Syncope    Negative tilt table test   Syringomyelia and syringobulbia (HCC)    BP 130/83    Pulse 93    Temp 98.4 F (36.9 C)    Ht 5' 7"  (1.702 m)    Wt 156 lb (70.8 kg)    SpO2 96%    BMI 24.43 kg/m   Opioid Risk Score:   Fall Risk Score:  `1  Depression screen PHQ 2/9  Depression screen [facility] 2/9 08/25/2021 07/21/2021 06/23/2021 05/29/2021 04/30/2021 02/06/2021 01/09/2021  Decreased Interest 0 0 3 2 1 1 1   Down, Depressed, Hopeless 0 0 3 2 1 1 1   PHQ - 2 Score 0 0 6 4 2 2 2   Altered sleeping - - 1 - - - -  Tired, decreased energy - - 3 - - - -  Change in appetite - - 3 - - - -  Feeling bad or failure about yourself  - - 1 - - - -  Trouble concentrating - - 3 - -  - -  Moving slowly or fidgety/restless - - 1 - - - -  Suicidal thoughts - - 0 - - - -  PHQ-9 Score - - 18 - - - -  Some recent data might be hidden     Review of Systems  Constitutional: Negative.   HENT: Negative.    Eyes: Negative.   Respiratory: Negative.    Cardiovascular: Negative.   Gastrointestinal: Negative.   Endocrine: Negative.   Genitourinary: Negative.   Musculoskeletal: Negative.   Skin: Negative.   Allergic/Immunologic: Negative.   Neurological: Negative.   Hematological: Negative.   Psychiatric/Behavioral: Negative.        Objective:   Physical Exam Vitals and nursing note reviewed.  Constitutional:      Appearance: Normal appearance.  Neck:     Comments: Cervical Paraspinal Tenderness: C-5-C-6 Cardiovascular:     Rate and Rhythm: Normal rate and regular rhythm.     Pulses: Normal pulses.     Heart sounds: Normal heart sounds.  Musculoskeletal:     Cervical back: Normal range of motion and neck supple.     Comments: Normal Muscle Bulk and Muscle Testing Reveals:  Upper Extremities: Full ROM and Muscle Strength 5/5 Thoracic Paraspinal Tenderness: T-7-T-9  Lower Extremities: Full ROM and Muscle Strength 5/5 Arises from Table with Ease Narrow Based  Gait     Skin:    General: Skin is warm and dry.  Neurological:     Mental Status: She is alert and oriented to person, place, and time.  Psychiatric:        Mood and Affect: Mood normal.        Behavior: Behavior normal.         Assessment & Plan:  1. Lumbar degenerative disc disease: 08/25/2021. Continue with current treatment regimen: Refilled : HYDROcodone 7.5/325 mg two tablets every 8 hours as needed #180. We will continue the opioid monitoring program, this consists of regular clinic visits, examinations, urine drug screen, pill counts as well as use of [place] Controlled Substance Reporting system. A 12 month History has been reviewed on the [place] Controlled Substance  Reporting System on 08/25/2021  2. Thoracic Spondylosis: Continue current treatment with HEP and  current medication regime. 08/25/2021. 3 Cervicalgia/ Cervical Radiculitis/ Cervical spondylosis without evidence of  myelopathy: Continue Gabapentin. Continue to Monitor. 08/25/2021.  Lyrica discontinued due to adverse effects. Continue with exercise and heat therapy. 08/25/2021 4. Fibromyalgia:Continue with Heat and  exercise Regimen. Continue Gabapentin. 08/25/2021  5.Depression and anxiety: PCP Following. 08/25/2021 6. Orthostatic hypotension: Cardiology Following. 08/25/2021 7. Polyarthralgia: Continue to monitor. 01/232023 8. Chronic Bilateral  Shoulder Pain: Continue current medication regime. Continue HEP as Tolerated. 01/23//2023. 9. Muscle Spasm:No complaints today.  Continue to monitor.  08/25/2021. 10. Neuropathic Pain: Continue Gabapentin. Continue to Monitor.  08/25/2021  11. Right elbow Pain: Continue current medication regimen. Continue to monitor. 08/25/2021 12. Right  Knee with OA: No complaints today.Continue HEP as Tolerated. Continue current medication regimen. Continue to monitor. 08/25/2021   F/U in 1 month

## 2021-08-27 ENCOUNTER — Ambulatory Visit: Payer: Medicare Other | Admitting: Neurology

## 2021-08-27 ENCOUNTER — Telehealth: Payer: Self-pay | Admitting: Neurology

## 2021-08-27 ENCOUNTER — Other Ambulatory Visit: Payer: Self-pay

## 2021-08-27 ENCOUNTER — Encounter: Payer: Self-pay | Admitting: Neurology

## 2021-08-27 VITALS — BP 118/80 | HR 92 | Ht 67.0 in | Wt 159.0 lb

## 2021-08-27 DIAGNOSIS — U099 Post covid-19 condition, unspecified: Secondary | ICD-10-CM | POA: Diagnosis not present

## 2021-08-27 DIAGNOSIS — R41840 Attention and concentration deficit: Secondary | ICD-10-CM

## 2021-08-27 DIAGNOSIS — H53143 Visual discomfort, bilateral: Secondary | ICD-10-CM

## 2021-08-27 DIAGNOSIS — F450 Somatization disorder: Secondary | ICD-10-CM | POA: Insufficient documentation

## 2021-08-27 DIAGNOSIS — G90A Postural orthostatic tachycardia syndrome (POTS): Secondary | ICD-10-CM

## 2021-08-27 DIAGNOSIS — G43109 Migraine with aura, not intractable, without status migrainosus: Secondary | ICD-10-CM

## 2021-08-27 MED ORDER — EMGALITY 120 MG/ML ~~LOC~~ SOAJ: 120.0000 mg | SUBCUTANEOUS | 5 refills | Status: DC

## 2021-08-27 MED ORDER — GALCANEZUMAB-GNLM 120 MG/ML ~~LOC~~ SOAJ
120.0000 mg | Freq: Once | SUBCUTANEOUS | Status: AC
  Administered 2021-08-27: 15:00:00 120 mg via SUBCUTANEOUS

## 2021-08-27 MED ORDER — UBRELVY 100 MG PO TABS: 100.0000 mg | ORAL_TABLET | ORAL | 5 refills | Status: DC | PRN

## 2021-08-27 MED ORDER — FREMANEZUMAB-VFRM 225 MG/1.5ML ~~LOC~~ SOSY: PREFILLED_SYRINGE | SUBCUTANEOUS | Status: DC

## 2021-08-27 NOTE — Addendum Note (Signed)
Addended by: Georgiann Cocker on: 08/27/2021 03:31 PM   Modules accepted: Orders

## 2021-08-27 NOTE — Patient Instructions (Signed)
Ubrogepant tablets What is this medication? UBROGEPANT (ue BROE je pant) is used to treat migraine headaches with or without aura. An aura is a strange feeling or visual disturbance that warns you of an attack. It is not used to prevent migraines. This medicine may be used for other purposes; ask your health care provider or pharmacist if you have questions. COMMON BRAND NAME(S): Roselyn Meier What should I tell my care team before I take this medication? They need to know if you have any of these conditions: kidney disease liver disease an unusual or allergic reaction to ubrogepant, other medicines, foods, dyes, or preservatives pregnant or trying to get pregnant breast-feeding How should I use this medication? Take this medicine by mouth with a glass of water. Follow the directions on the prescription label. You can take it with or without food. If it upsets your stomach, take it with food. Take your medicine at regular intervals. Do not take it more often than directed. Do not stop taking except on your doctor's advice. Talk to your pediatrician about the use of this medicine in children. Special care may be needed. Overdosage: If you think you have taken too much of this medicine contact a poison control center or emergency room at once. NOTE: This medicine is only for you. Do not share this medicine with others. What if I miss a dose? This does not apply. This medicine is not for regular use. What may interact with this medication? Do not take this medicine with any of the following medicines: ceritinib certain antibiotics like chloramphenicol, clarithromycin, telithromycin certain antivirals for HIV like atazanavir, cobicistat, darunavir, delavirdine, fosamprenavir, indinavir, ritonavir certain medicines for fungal infections like itraconazole, ketoconazole, posaconazole, voriconazole conivaptan grapefruit idelalisib mifepristone nefazodone ribociclib This medicine may also interact  with the following medications: carvedilol certain medicines for seizures like phenobarbital, phenytoin ciprofloxacin cyclosporine eltrombopag fluconazole fluvoxamine quinidine rifampin [facility]. John's wort verapamil This list may not describe all possible interactions. Give your health care provider a list of all the medicines, herbs, non-prescription drugs, or dietary supplements you use. Also tell them if you smoke, drink alcohol, or use illegal drugs. Some items may interact with your medicine. What should I watch for while using this medication? Visit your health care professional for regular checks on your progress. Tell your health care professional if your symptoms do not start to get better or if they get worse. Your mouth may get dry. Chewing sugarless gum or sucking hard candy and drinking plenty of water may help. Contact your health care professional if the problem does not go away or is severe. What side effects may I notice from receiving this medication? Side effects that you should report to your doctor or health care professional as soon as possible: allergic reactions like skin rash, itching or hives; swelling of the face, lips, or tongue Side effects that usually do not require medical attention (report these to your doctor or health care professional if they continue or are bothersome): drowsiness dry mouth nausea tiredness This list may not describe all possible side effects. Call your doctor for medical advice about side effects. You may report side effects to FDA at 1-800-FDA-1088. Where should I keep my medication? Keep out of the reach of children. Store at room temperature between 15 and 30 degrees C (59 and 86 degrees F). Throw away any unused medicine after the expiration date. NOTE: This sheet is a summary. It may not cover all possible information. If you have questions about this  medicine, talk to your doctor, pharmacist, or health care provider.  2022  [employer]vier/Gold Standard (2018-10-06 00:00:00)

## 2021-08-27 NOTE — Progress Notes (Addendum)
SLEEP MEDICINE CLINIC    Provider:  Larey Seat, MD  Primary Care Physician:  [facility] [address], [place] [address]     Referring Provider: Charlett Blake, Md [place],  [place] [address]          Chief Complaint according to patient   Patient presents with:     New Patient (Initial Visit)           HISTORY OF PRESENT ILLNESS:  [name] is a 46 y.o.  Caucasian female patient seen here for the third time upon referral by dr Letta Pate office, 08-27-2021,  on 08/27/2021  for a headache evaluation with a lot of interesting symptoms and signs that are unusual.  The patient had been seen in this office for a different problem in the past , was evaluated for sleep and had no organic abnormalities on her sleep study in 2014. The patient was referred to psychiatry ,not to follow up in sleep clinic, she was suspected to have depression manifesting as somatization disorder.     08-27-2021: Rm 10, w husband. Internal referral for chronic HA.  Was not scheduled with headache specialist. Onset with second COVID infection. Pt reports a HA all day every day. Could be related to her BP or due to bulging disc in neck. L ear  very sensitive to noise . Pressure headaches, moving and light makes it worse. Touching the front of her neck causes sensitivity in her ears, almost like hearing a pulse. Has noticed blood in her ears.  Has had Numbness in her L foot around Dec 18th.  Does have some PVCs and PACs , not arrhthymias, and seeing cardiologist.     [name]  has a past medical history of Anxiety, Chronic pain, Degenerative disc disease, Depression, Esophageal stricture, Fibromyalgia, GERD (gastroesophageal reflux disease), Hiatal hernia, Internal hemorrhoids without mention of complication, Irritable bowel syndrome, Palpitations, Personal history of colonic polyps (10/04/2000), POTS (postural orthostatic tachycardia syndrome),  Syncope, and Syringomyelia and syringobulbia ([place]).. The patient has twice contracted COVID 1 time in December 2021 and another time at the end of 2022. She was unvaccinated- paxlovix.  A 2-monthold grand daughter was also very sick with a viral illness in December and [facility]  This was the high time for admissions with RSV, she had"  flu ". A 7 day old grandson will now also be watched by his grandparents  who remain unvaccinated...  She report that she can hear extremely loud when she touch the outside of he ear. No ear pain.  Hyperacusis?  Feeling as if someone is choking her , can't stand her neck to be touched. The front of her neck feel sensitive. Her headache feel as if she has a delayed relapse to movement of the head, the  patient describes the top of her head to be affected by this pressure sensation, both eyes feel as if there is pressure behind 7, she also reports that this radiates to the nape of the neck.  Daily headaches, 6 hours or more. Nausea- photophobia.     She has a chronic pain syndrome,  She has memory worsening since COVID times 2.  She is not followed by neurology- had  POTS, Dr KCaryl Comes2013  ,  Later 2020:  PVCs an PACs, ZIO patch.   Amitrypline helped to sleep. Did it cause cardiac rhythm changes. ? [employer] [facility]  [facility] at [facility], PCP    Review of Systems: Out of a complete 14 system review, the patient complains of only the following symptoms, and all other reviewed systems are negative.:    Migraine headaches, chronic daily and waxing and waning.   Wakes up with HA, goes to bed with headaches.   DEPRESSION _ ENDORSED 10 out of 15 points on GD Score.   Social History   Socioeconomic History   Marital status: Married    Spouse name: Not on file   Number of children: 2   Years of education: Not on file   Highest education level: Not on file  Occupational History   Occupation: Disability    Employer: UNEMPLOYED   Tobacco Use   Smoking status: Former    Types: Cigarettes    Quit date: 06/30/2011    Years since quitting: 10.1   Smokeless tobacco: Never  Vaping Use   Vaping Use: Some days   Substances: Flavoring  Substance and Sexual Activity   Alcohol use: No    Alcohol/week: 0.0 standard drinks   Drug use: No   Sexual activity: Yes  Other Topics Concern   Not on file  Social History Narrative   Not on file   Social Determinants of Health   Financial Resource Strain: Not on file  Food Insecurity: Not on file  Transportation Needs: Not on file  Physical Activity: Not on file  Stress: Not on file  Social Connections: Not on file    Family History  Problem Relation Age of Onset   Diabetes type II Mother    Colon polyps Mother    Diverticulitis Mother    Diabetes Mother    Heart disease Mother    Osteoporosis Father    Diabetes type II Brother    Uterine cancer Other    Ovarian cancer Other    COPD Paternal Grandfather    Heart attack Maternal Grandmother     Past Medical History:  Diagnosis Date   Anxiety    Chronic pain    Dr. Letta Pate   Degenerative disc disease    Spinal, small syrinx   Depression    Esophageal stricture    Status post dilatation   Fibromyalgia    GERD (gastroesophageal reflux disease)    Hiatal hernia    Internal hemorrhoids     Irritable bowel syndrome    Palpitations    No documented arrhythmias, PVCs and PACs.    Personal history of colonic polyps 10/04/2000   HYPERPLASTIC POLYP   POTS (postural orthostatic tachycardia syndrome)    Syncope    Negative tilt table test   Somatization disorder      Past Surgical History:  Procedure Laterality Date   CESAREAN SECTION     Left elbow surgery     PARTIAL HYSTERECTOMY     TONSILLECTOMY       Current Outpatient Medications on File Prior to Visit  Medication Sig Dispense Refill   amitriptyline (ELAVIL) 50 MG tablet Take 50 mg by mouth at bedtime.     amphetamine-dextroamphetamine  (ADDERALL XR) 20 MG 24 hr capsule Take 20 mg by mouth 2 (two) times daily.     butalbital-acetaminophen-caffeine (FIORICET, ESGIC) 50-325-40 MG tablet TAKE 1 TABLET BY MOUTH DAILY AS NEEDED FOR HEADACHE  2   clonazePAM (KLONOPIN) 1 MG tablet Take 1 mg by mouth 3 (three) times daily.     doxycycline (VIBRAMYCIN) 100 MG capsule Take 100 mg by mouth 2 (two) times daily.  DULoxetine (CYMBALTA) 30 MG capsule Take 30 mg by mouth daily.     fluticasone (FLONASE) 50 MCG/ACT nasal spray SMARTSIG:1 Spray(s) Both Nares Twice Daily PRN     gabapentin (NEURONTIN) 100 MG capsule TAKE ONE CAPSULE BY MOUTH EVERY MORNING AND AFTERNOON, AND TAKE TWO CAPSULES AT BEDTIME 120 capsule 3   HYDROcodone-acetaminophen (NORCO) 7.5-325 MG tablet Take 2 tablets by mouth every 8 (eight) hours as needed for moderate pain. 180 tablet 0   methocarbamol (ROBAXIN) 1000 MG/10ML injection Inject into the muscle.     methocarbamol (ROBAXIN) 500 MG tablet Take 500 mg by mouth every 6 (six) hours as needed for muscle spasms.     traZODone (DESYREL) 50 MG tablet Take 50 mg by mouth at bedtime.     No current facility-administered medications on file prior to visit.    Allergies  Allergen Reactions   Codeine Other (See Comments)    Chest tightness    Physical exam:  Today's Vitals   08/27/21 1356  BP: 118/80  Pulse: 92  SpO2: 96%  Weight: 159 lb (72.1 kg)  Height: 5' 7"  (1.702 m)   Body mass index is 24.9 kg/m.   Wt Readings from Last 3 Encounters:  08/27/21 159 lb (72.1 kg)  08/25/21 156 lb (70.8 kg)  07/21/21 156 lb (70.8 kg)     Ht Readings from Last 3 Encounters:  08/27/21 5' 7"  (1.702 m)  08/25/21 5' 7"  (1.702 m)  07/21/21 5' 7"  (1.702 m)      General: The patient is awake, alert and appears not in acute distress. The patient is well groomed. Head: Normocephalic, atraumatic. Neck is supple. Mallampati 2,  neck circumference:14 inches . Nasal airflow patent.  Retrognathia is not seen.  Dental status:   Cardiovascular:  Regular rate and cardiac rhythm by pulse,  without distended neck veins. Respiratory: Lungs are clear to auscultation.  Skin:  Without evidence of ankle edema, or rash. Trunk: The patient's posture is erect.   Neurologic exam : The patient is awake and alert, oriented to place and time.   Memory subjective described as impaired   Attention span & concentration ability appears normal.  Speech is fluent,  without dysarthria, dysphonia or aphasia.  Mood and affect are depressed.    Cranial nerves: no loss of smell or taste reported  Pupils are equal and briskly reactive to light. Funduscopic exam deferred. .  Extraocular movements in vertical and horizontal planes were intact and without nystagmus. No Diplopia. Visual fields by finger perimetry are intact. Hearing was intact to soft voice and finger rubbing.    Facial sensation intact to fine touch.  Facial motor strength is symmetric and tongue and uvula move midline.  Neck ROM : rotation, tilt and flexion extension were normal for age and shoulder shrug was symmetrical.    Motor exam:  Symmetric bulk, tone and ROM.   Normal tone without cog-wheeling, symmetric grip strength .   Sensory:  numb spot left foot.    Coordination: Rapid alternating movements in the fingers/hands were of normal speed.  The Finger-to-nose maneuver was intact without evidence of ataxia, dysmetria or tremor.   Gait and station: Patient could rise unassisted from a seated position, walked without assistive device.  Stance is of normal width/ base Toe and heel walk were deferred.  Deep tendon reflexes: in the upper and lower extremities are symmetric and intact.  Babinski response was deferred.       After spending a total time of  50  minutes face to face and additional time for physical and neurologic examination, review of laboratory studies,  personal review of imaging studies, reports and results of other testing and review of referral  information / records as far as provided in visit, I have established the following assessments:  1) the patient presents with a chronic headache that has migrainous features being associated with nausea and photophobia.  Photophobia seems to persist even when the headache intensity gets lower.  The headache is described as waxing and waning but she seems never to be completely free of pain.  She may go to bed with headaches she may wake up with headaches.  There is also a component of retro-orbital pressure and of tension arising from the neck and occiput.  2) GAD, DEPRESSION. PLEASE REFER FOR PSYCHOLOGICAL EVALUATION AND NEUROPSYCHOLOGY>   3) the patient has contracted COVID twice each time in December and the year is 2021 and 22.  Her second infection also left her quite sick and she recalls that she had more memory difficulties more headaches and more heart rate irregularities since.    My Plan is to proceed with:  1) I suggest to start a known vasoconstricting and not heart rate affecting medication the patient had in the past failed topiramate, amitriptyline has not been sufficient to help with headache control and in addition does affect heart rate and can affect affect heart rhythm.  On beta-blocker would not be used in a patient who has POTS controlled at this time-  there is normal heart rate.  She can't tolerate Triptans, it made her feel creepy crawly with the throat closing up-   A triptan should not be used because of its vasoconstricting effect.   But we are  left with is a trial of Ajovy-Emgality which would be a preventive medication, would be  injected every 30 days.  It comes in its own pen is very easy to use and it is infrequent sleep used as a 30-day interval.  There is also a oral form - Roselyn Meier that we could use and I will start with Roselyn Meier as I know that it may take a longer process to get the injectable authorized by her health insurance.   Charlett Blake, Md [place],  [place] [address]   Please refer to headache specialist in the future, We have 3 botox authorized MDs here ( I am specialized in sleep disorders and EEG )    I ordered MRI brain and her ubrelvy and emgality first dose observation with my RN here today.    In short, RUSSIA SCHEIDERER is presenting with chronic migraine, intractable.  I plan to follow up through our NP within 3-4 months.     CC: I will share my notes with Dr Read Drivers. .  Electronically signed by: Larey Seat, MD 08/27/2021 2:20 PM  [facility] Board certified by The [employer] of Sleep Medicine and Diplomate of the [employer] of Sleep Medicine. Board certified In Neurology through the [place], Fellow of the [employer] of Neurology. Medical Director of [facility].

## 2021-08-27 NOTE — Telephone Encounter (Signed)
[employer] medicare order sent to [facility], [facility] they will reach out to the patient to schedule.

## 2021-08-28 ENCOUNTER — Telehealth: Payer: Self-pay | Admitting: Neurology

## 2021-08-28 NOTE — Telephone Encounter (Signed)
Roselyn Meier approved "Request Reference Number: [id number]. UBRELVY TAB 100MG is approved through 08/02/2022. Your patient may now fill this prescription and it will be covered."  Emgality approved Request Reference Number: [id number]. EMGALITY INJ 120MG/ML is approved through 08/02/2022. Your patient may now fill this prescription and it will be covered.

## 2021-08-28 NOTE — Telephone Encounter (Signed)
PA completed on CMM/optum for Emgality KEY:[id number] Will await determination  Completed PA for Ubrelvy on CMM/optum KEY: [id number] Will await determination

## 2021-08-30 LAB — TOXASSURE SELECT,+ANTIDEPR,UR

## 2021-09-01 DIAGNOSIS — Z1211 Encounter for screening for malignant neoplasm of colon: Secondary | ICD-10-CM | POA: Diagnosis not present

## 2021-09-01 DIAGNOSIS — Z1212 Encounter for screening for malignant neoplasm of rectum: Secondary | ICD-10-CM | POA: Diagnosis not present

## 2021-09-02 ENCOUNTER — Telehealth: Payer: Self-pay | Admitting: *Deleted

## 2021-09-02 NOTE — Telephone Encounter (Signed)
Urine drug screen for this encounter is consistent for prescribed medication 

## 2021-09-08 ENCOUNTER — Ambulatory Visit
Admission: RE | Admit: 2021-09-08 | Discharge: 2021-09-08 | Disposition: A | Discharge status: Home / Self Care | Payer: Medicare Other | Source: Ambulatory Visit | Attending: Neurology | Admitting: Neurology

## 2021-09-08 ENCOUNTER — Other Ambulatory Visit: Payer: Self-pay

## 2021-09-08 DIAGNOSIS — H53143 Visual discomfort, bilateral: Secondary | ICD-10-CM

## 2021-09-08 DIAGNOSIS — R41840 Attention and concentration deficit: Secondary | ICD-10-CM

## 2021-09-08 DIAGNOSIS — U099 Post covid-19 condition, unspecified: Secondary | ICD-10-CM

## 2021-09-08 DIAGNOSIS — G43109 Migraine with aura, not intractable, without status migrainosus: Secondary | ICD-10-CM

## 2021-09-08 DIAGNOSIS — F450 Somatization disorder: Secondary | ICD-10-CM

## 2021-09-08 DIAGNOSIS — G90A Postural orthostatic tachycardia syndrome (POTS): Secondary | ICD-10-CM

## 2021-09-08 MED ORDER — GADOBENATE DIMEGLUMINE 529 MG/ML IV SOLN
14.0000 mL | Freq: Once | INTRAVENOUS | Status: AC | PRN
  Administered 2021-09-08: 14 mL via INTRAVENOUS

## 2021-09-09 ENCOUNTER — Encounter: Payer: Self-pay | Admitting: Neurology

## 2021-09-09 ENCOUNTER — Telehealth: Payer: Self-pay | Admitting: Neurology

## 2021-09-09 NOTE — Telephone Encounter (Signed)
Called the pt to review the MRI results. There was no answer. LVM advising of the normal finding and instructing the pt to call with any questions may have. I will send a mychart message as well.

## 2021-09-11 LAB — COLOGUARD: COLOGUARD: NEGATIVE

## 2021-09-18 ENCOUNTER — Encounter: Payer: Self-pay | Admitting: Neurology

## 2021-09-18 NOTE — Progress Notes (Signed)
Normal MRI brain - no fluid collection, edema, stroke or tumor. No MS.   Cc Dr Gerarda Fraction.

## 2021-09-24 ENCOUNTER — Encounter: Payer: Medicare Other | Attending: Physical Medicine & Rehabilitation | Admitting: Registered Nurse

## 2021-09-24 DIAGNOSIS — G8929 Other chronic pain: Secondary | ICD-10-CM | POA: Insufficient documentation

## 2021-09-24 DIAGNOSIS — M47814 Spondylosis without myelopathy or radiculopathy, thoracic region: Secondary | ICD-10-CM | POA: Insufficient documentation

## 2021-09-24 DIAGNOSIS — M792 Neuralgia and neuritis, unspecified: Secondary | ICD-10-CM | POA: Insufficient documentation

## 2021-09-24 DIAGNOSIS — Z79899 Other long term (current) drug therapy: Secondary | ICD-10-CM | POA: Insufficient documentation

## 2021-09-24 DIAGNOSIS — M542 Cervicalgia: Secondary | ICD-10-CM | POA: Insufficient documentation

## 2021-09-24 DIAGNOSIS — M545 Low back pain, unspecified: Secondary | ICD-10-CM | POA: Insufficient documentation

## 2021-09-24 DIAGNOSIS — M797 Fibromyalgia: Secondary | ICD-10-CM | POA: Insufficient documentation

## 2021-09-24 DIAGNOSIS — M4716 Other spondylosis with myelopathy, lumbar region: Secondary | ICD-10-CM | POA: Insufficient documentation

## 2021-09-24 DIAGNOSIS — M2550 Pain in unspecified joint: Secondary | ICD-10-CM | POA: Insufficient documentation

## 2021-09-24 DIAGNOSIS — Z5181 Encounter for therapeutic drug level monitoring: Secondary | ICD-10-CM | POA: Insufficient documentation

## 2021-09-24 DIAGNOSIS — G894 Chronic pain syndrome: Secondary | ICD-10-CM | POA: Insufficient documentation

## 2021-09-24 DIAGNOSIS — M546 Pain in thoracic spine: Secondary | ICD-10-CM | POA: Insufficient documentation

## 2021-09-25 ENCOUNTER — Telehealth: Payer: Self-pay | Admitting: Registered Nurse

## 2021-09-25 MED ORDER — HYDROCODONE-ACETAMINOPHEN 7.5-325 MG PO TABS: 2.0000 | ORAL_TABLET | Freq: Three times a day (TID) | ORAL | 0 refills | Status: DC | PRN

## 2021-09-25 NOTE — Telephone Encounter (Signed)
PMP was Reviewed,  Hydrocodone  e-scribed today.  Placed a call to Ms. [place] regarding he above, she verbalizes understanding.

## 2021-09-25 NOTE — Telephone Encounter (Signed)
Her car broke down. Please send in her medications. She is rescheduled for next month.

## 2021-10-22 ENCOUNTER — Encounter: Payer: Self-pay | Admitting: Registered Nurse

## 2021-10-22 ENCOUNTER — Other Ambulatory Visit: Payer: Self-pay

## 2021-10-22 ENCOUNTER — Encounter: Payer: Medicare Other | Attending: Physical Medicine & Rehabilitation | Admitting: Registered Nurse

## 2021-10-22 VITALS — BP 108/74 | HR 88 | Ht 67.0 in | Wt 159.0 lb

## 2021-10-22 DIAGNOSIS — M2550 Pain in unspecified joint: Secondary | ICD-10-CM | POA: Diagnosis not present

## 2021-10-22 DIAGNOSIS — G8929 Other chronic pain: Secondary | ICD-10-CM

## 2021-10-22 DIAGNOSIS — M546 Pain in thoracic spine: Secondary | ICD-10-CM | POA: Insufficient documentation

## 2021-10-22 DIAGNOSIS — M797 Fibromyalgia: Secondary | ICD-10-CM | POA: Diagnosis not present

## 2021-10-22 DIAGNOSIS — M792 Neuralgia and neuritis, unspecified: Secondary | ICD-10-CM

## 2021-10-22 DIAGNOSIS — Z79899 Other long term (current) drug therapy: Secondary | ICD-10-CM

## 2021-10-22 DIAGNOSIS — M4716 Other spondylosis with myelopathy, lumbar region: Secondary | ICD-10-CM | POA: Diagnosis not present

## 2021-10-22 DIAGNOSIS — G894 Chronic pain syndrome: Secondary | ICD-10-CM

## 2021-10-22 DIAGNOSIS — Z5181 Encounter for therapeutic drug level monitoring: Secondary | ICD-10-CM | POA: Diagnosis not present

## 2021-10-22 MED ORDER — GABAPENTIN 100 MG PO CAPS: ORAL_CAPSULE | ORAL | 3 refills | Status: DC

## 2021-10-22 MED ORDER — HYDROCODONE-ACETAMINOPHEN 7.5-325 MG PO TABS: 2.0000 | ORAL_TABLET | Freq: Three times a day (TID) | ORAL | 0 refills | Status: DC | PRN

## 2021-10-22 NOTE — Progress Notes (Signed)
? ?Subjective:  ? ? Patient ID: [name], female    DOB: [birth year], 46 y.o.   MRN: [id number] ? ?HPI: [name] is a 46 y.o. female who returns for follow up appointment for chronic pain and medication refill. She states her  pain is located in her bilateral hands with tingling and numbness, mid- lower back pain and generalized joint pain. She also reports she has a headache today, she is compliant with her medication.She rates her pain 5. Her current exercise regime is walking and performing stretching exercises. ? ?Ms. Basden Morphine equivalent is 45.00 MME.   Last UDS was Performed on 08/25/2021, it was consistent.  ?  ? ?Pain Inventory ?Average Pain 6 ?Pain Right Now 5 ?My pain is sharp, stabbing, tingling, and aching ? ?In the last 24 hours, has pain interfered with the following? ?General activity 7 ?Relation with others 7 ?Enjoyment of life 7 ?What TIME of day is your pain at its worst? morning , evening, and night ?Sleep (in general) Fair ? ?Pain is worse with: walking, bending, sitting, inactivity, standing, and some activites ?Pain improves with: rest, heat/ice, and medication ?Relief from Meds: 6 ? ?Family History  ?Problem Relation Age of Onset  ? Diabetes type II Mother   ? Colon polyps Mother   ? Diverticulitis Mother   ? Diabetes Mother   ? Heart disease Mother   ? Osteoporosis Father   ? Diabetes type II Brother   ? Uterine cancer Other   ? Ovarian cancer Other   ? COPD Paternal Grandfather   ? Heart attack Maternal Grandmother   ? ?Social History  ? ?Socioeconomic History  ? Marital status: Married  ?  Spouse name: Not on file  ? Number of children: 2  ? Years of education: Not on file  ? Highest education level: Not on file  ?Occupational History  ? Occupation: Disability  ?  Employer: UNEMPLOYED  ?Tobacco Use  ? Smoking status: Former  ?  Types: Cigarettes  ?  Quit date: 06/30/2011  ?  Years since quitting: 10.3  ? Smokeless tobacco: Never  ?Vaping Use  ? Vaping Use: Some days  ?  Substances: Flavoring  ?Substance and Sexual Activity  ? Alcohol use: No  ?  Alcohol/week: 0.0 standard drinks  ? Drug use: No  ? Sexual activity: Yes  ?Other Topics Concern  ? Not on file  ?Social History Narrative  ? Not on file  ? ?Social Determinants of Health  ? ?Financial Resource Strain: Not on file  ?Food Insecurity: Not on file  ?Transportation Needs: Not on file  ?Physical Activity: Not on file  ?Stress: Not on file  ?Social Connections: Not on file  ? ?Past Surgical History:  ?Procedure Laterality Date  ? CESAREAN SECTION    ? Left elbow surgery    ? PARTIAL HYSTERECTOMY    ? TONSILLECTOMY    ? ?Past Surgical History:  ?Procedure Laterality Date  ? CESAREAN SECTION    ? Left elbow surgery    ? PARTIAL HYSTERECTOMY    ? TONSILLECTOMY    ? ?Past Medical History:  ?Diagnosis Date  ? Anxiety   ? Chronic pain   ? Dr. Letta Pate  ? Degenerative disc disease   ? Spinal, small syrinx  ? Depression   ? Esophageal stricture   ? Status post dilatation  ? Fibromyalgia   ? GERD (gastroesophageal reflux disease)   ? Hiatal hernia   ? Internal hemorrhoids without mention of complication   ?  Irritable bowel syndrome   ? Palpitations   ? No documented arrhythmias  ? Personal history of colonic polyps 10/04/2000  ? HYPERPLASTIC POLYP  ? POTS (postural orthostatic tachycardia syndrome)   ? Syncope   ? Negative tilt table test  ? Syringomyelia and syringobulbia ([place])   ? ?BP 108/74   Pulse 88   Ht 5' 7"  (1.702 m)   Wt 159 lb (72.1 kg)   SpO2 99%   BMI 24.90 kg/m?  ? ?Opioid Risk Score:   ?Fall Risk Score:  `1 ? ?Depression screen PHQ 2/9 ? ? ?  10/22/2021  ?  3:15 PM 08/25/2021  ?  2:55 PM 07/21/2021  ?  2:20 PM 06/23/2021  ?  2:51 PM 05/29/2021  ?  1:18 PM 04/30/2021  ? 11:37 AM 02/06/2021  ?  3:13 PM  ?Depression screen PHQ 2/9  ?Decreased Interest 0 0 0 3 2 1 1   ?Down, Depressed, Hopeless 0 0 0 3 2 1 1   ?PHQ - 2 Score 0 0 0 6 4 2 2   ?Altered sleeping    1     ?Tired, decreased energy    3     ?Change in appetite    3      ?Feeling bad or failure about yourself     1     ?Trouble concentrating    3     ?Moving slowly or fidgety/restless    1     ?Suicidal thoughts    0     ?PHQ-9 Score    18     ?  ? ?Review of Systems  ?Musculoskeletal:  Positive for back pain.  ?All other systems reviewed and are negative. ? ?   ?Objective:  ? Physical Exam ?Vitals and nursing note reviewed.  ?Constitutional:   ?   Appearance: Normal appearance.  ?Neck:  ?   Comments: Cervical Paraspinal Tenderness: C-5-C-6 ?Cardiovascular:  ?   Rate and Rhythm: Normal rate and regular rhythm.  ?   Pulses: Normal pulses.  ?   Heart sounds: Normal heart sounds.  ?Pulmonary:  ?   Effort: Pulmonary effort is normal.  ?   Breath sounds: Normal breath sounds.  ?Musculoskeletal:  ?   Cervical back: Normal range of motion and neck supple.  ?   Comments: Normal Muscle Bulk and Muscle Testing Reveals:  ?Upper Extremities: Full ROM and Muscle Strength 5/5 ?Bilateral AC Joint Tenderness ?Thoracic Paraspinal Tenderness: T-7-T-9  ?Lumbar Paraspinal Tenderness: L-4-L-5 ?Lower Extremities: Full ROM and Muscle Strength 5/5 ?Arises from Table with Ease ?Narrow Based  Gait  ?   ?Skin: ?   General: Skin is warm and dry.  ?Neurological:  ?   Mental Status: She is alert and oriented to person, place, and time.  ?Psychiatric:     ?   Mood and Affect: Mood normal.     ?   Behavior: Behavior normal.  ? ? ? ? ?   ?Assessment & Plan:  ?1. Lumbar degenerative disc disease: 033/22/2023. ?Continue with current treatment regimen: Refilled : HYDROcodone 7.5/325 mg two tablets every 8 hours as needed #180. ?We will continue the opioid monitoring program, this consists of regular clinic visits, examinations, urine drug screen, pill counts as well as use of [place] Controlled Substance Reporting system. A 12 month History has been reviewed on the [place] Controlled Substance Reporting System on 10/22/2021  ?2. Thoracic Spondylosis: Continue current treatment with HEP and  current  medication regime. 10/22/2021. 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 Continue to Monitor. 10/22/2021.  ?Lyrica discontinued due to adverse effects. Continue with exercise and heat therapy. 10/22/2021 ?4. Fibromyalgia:Continue with Heat and  exercise Regimen. Continue Gabapentin. 10/22/2021  ?5.Depression and anxiety: PCP Following. 10/22/2021 ?6. Orthostatic hypotension: Cardiology Following. 10/22/2021 ?7. Polyarthralgia: Continue to monitor. 03/222023 ?8. Chronic Bilateral  Shoulder Pain: Continue current medication regime. Continue HEP as Tolerated. 03/22//2023. ?9. Muscle Spasm:No complaints today.  Continue to monitor.  10/22/2021. ?10. Neuropathic Pain: Continue Gabapentin. Continue to Monitor.  10/22/2021 ? 11. Right elbow Pain: No complaints today. Continue current medication regimen. Continue to monitor. 10/22/2021 ?12. Right  Knee with OA: No complaints today.Continue HEP as Tolerated. Continue current medication regimen. Continue to monitor. 10/22/2021 ?  ?F/U in 1 month ?  ? ?

## 2021-11-03 DIAGNOSIS — E063 Autoimmune thyroiditis: Secondary | ICD-10-CM | POA: Diagnosis not present

## 2021-11-03 DIAGNOSIS — E559 Vitamin D deficiency, unspecified: Secondary | ICD-10-CM | POA: Diagnosis not present

## 2021-11-03 DIAGNOSIS — Z79899 Other long term (current) drug therapy: Secondary | ICD-10-CM | POA: Diagnosis not present

## 2021-11-03 DIAGNOSIS — Z0001 Encounter for general adult medical examination with abnormal findings: Secondary | ICD-10-CM | POA: Diagnosis not present

## 2021-11-03 DIAGNOSIS — I10 Essential (primary) hypertension: Secondary | ICD-10-CM | POA: Diagnosis not present

## 2021-11-03 DIAGNOSIS — R5383 Other fatigue: Secondary | ICD-10-CM | POA: Diagnosis not present

## 2021-11-19 ENCOUNTER — Encounter: Payer: Self-pay | Admitting: Registered Nurse

## 2021-11-19 ENCOUNTER — Encounter: Payer: Medicare Other | Attending: Physical Medicine & Rehabilitation | Admitting: Registered Nurse

## 2021-11-19 VITALS — Ht 67.0 in | Wt 156.0 lb

## 2021-11-19 DIAGNOSIS — M2550 Pain in unspecified joint: Secondary | ICD-10-CM

## 2021-11-19 DIAGNOSIS — M545 Low back pain, unspecified: Secondary | ICD-10-CM | POA: Insufficient documentation

## 2021-11-19 DIAGNOSIS — M797 Fibromyalgia: Secondary | ICD-10-CM

## 2021-11-19 DIAGNOSIS — M25561 Pain in right knee: Secondary | ICD-10-CM | POA: Diagnosis not present

## 2021-11-19 DIAGNOSIS — M546 Pain in thoracic spine: Secondary | ICD-10-CM | POA: Diagnosis not present

## 2021-11-19 DIAGNOSIS — M542 Cervicalgia: Secondary | ICD-10-CM

## 2021-11-19 DIAGNOSIS — M25551 Pain in right hip: Secondary | ICD-10-CM | POA: Diagnosis not present

## 2021-11-19 DIAGNOSIS — Z5181 Encounter for therapeutic drug level monitoring: Secondary | ICD-10-CM | POA: Diagnosis not present

## 2021-11-19 DIAGNOSIS — M4716 Other spondylosis with myelopathy, lumbar region: Secondary | ICD-10-CM | POA: Diagnosis not present

## 2021-11-19 DIAGNOSIS — G894 Chronic pain syndrome: Secondary | ICD-10-CM

## 2021-11-19 DIAGNOSIS — G8929 Other chronic pain: Secondary | ICD-10-CM | POA: Diagnosis not present

## 2021-11-19 DIAGNOSIS — Z79899 Other long term (current) drug therapy: Secondary | ICD-10-CM

## 2021-11-19 MED ORDER — HYDROCODONE-ACETAMINOPHEN 7.5-325 MG PO TABS: 2.0000 | ORAL_TABLET | Freq: Three times a day (TID) | ORAL | 0 refills | Status: DC | PRN

## 2021-11-19 NOTE — Progress Notes (Signed)
? ?Subjective:  ? ? Patient ID: [name], female    DOB: 07-16-1976, 46 y.o.   MRN: [id number] ? ?HPI: [name] is a 46 y.o. female whose appointment was changed to My-Chart Video Visit, she was babysitting her grandchildren, she agrees with My-Chart Video Visit. We have  discussed the limitations of evaluation and management by telemedicine and the availability of in person appointments. The patient expressed understanding and agreed to proceed.  ? ?She  states her pain is located in her  neck, mid- lower back, right hip and right knee pain. Also reports generalized joint pain. She denies falling and states her hip and knee pain is chronic issue. She rates her pain 6. Her current exercise regime is walking and performing stretching exercises. ? ?[name] Morphine equivalent is 45.00 MME.   Last UDS was Performed on 08/25/2021, it was consistent.  ?  ? ?Pain Inventory ?Average Pain 6 ?Pain Right Now 6 ?My pain is sharp, stabbing, tingling, and aching ? ?In the last 24 hours, has pain interfered with the following? ?General activity 6 ?Relation with others 6 ?Enjoyment of life 6 ?What TIME of day is your pain at its worst? morning , evening, and night ?Sleep (in general) Fair ? ?Pain is worse with: walking, bending, sitting, standing, and some activites ?Pain improves with: rest, heat/ice, and medication ?Relief from Meds: 6 ? ?Family History  ?Problem Relation Age of Onset  ? Diabetes type II Mother   ? Colon polyps Mother   ? Diverticulitis Mother   ? Diabetes Mother   ? Heart disease Mother   ? Osteoporosis Father   ? Diabetes type II Brother   ? Uterine cancer Other   ? Ovarian cancer Other   ? COPD Paternal Grandfather   ? Heart attack Maternal Grandmother   ? ?Social History  ? ?Socioeconomic History  ? Marital status: Married  ?  Spouse name: Not on file  ? Number of children: 2  ? Years of education: Not on file  ? Highest education level: Not on file  ?Occupational History  ? Occupation:  Disability  ?  Employer: UNEMPLOYED  ?Tobacco Use  ? Smoking status: Former  ?  Types: Cigarettes  ?  Quit date: 06/30/2011  ?  Years since quitting: 10.3  ? Smokeless tobacco: Never  ?Vaping Use  ? Vaping Use: Some days  ? Substances: Flavoring  ?Substance and Sexual Activity  ? Alcohol use: No  ?  Alcohol/week: 0.0 standard drinks  ? Drug use: No  ? Sexual activity: Yes  ?Other Topics Concern  ? Not on file  ?Social History Narrative  ? Not on file  ? ?Social Determinants of Health  ? ?Financial Resource Strain: Not on file  ?Food Insecurity: Not on file  ?Transportation Needs: Not on file  ?Physical Activity: Not on file  ?Stress: Not on file  ?Social Connections: Not on file  ? ?Past Surgical History:  ?Procedure Laterality Date  ? CESAREAN SECTION    ? Left elbow surgery    ? PARTIAL HYSTERECTOMY    ? TONSILLECTOMY    ? ?Past Surgical History:  ?Procedure Laterality Date  ? CESAREAN SECTION    ? Left elbow surgery    ? PARTIAL HYSTERECTOMY    ? TONSILLECTOMY    ? ?Past Medical History:  ?Diagnosis Date  ? Anxiety   ? Chronic pain   ? Dr. Letta Pate  ? Degenerative disc disease   ? Spinal, small syrinx  ?  Depression   ? Esophageal stricture   ? Status post dilatation  ? Fibromyalgia   ? GERD (gastroesophageal reflux disease)   ? Hiatal hernia   ? Internal hemorrhoids without mention of complication   ? Irritable bowel syndrome   ? Palpitations   ? No documented arrhythmias  ? Personal history of colonic polyps 10/04/2000  ? HYPERPLASTIC POLYP  ? POTS (postural orthostatic tachycardia syndrome)   ? Syncope   ? Negative tilt table test  ? 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 ?There were no vitals taken for this visit. ? ?Opioid Risk Score:   ?Fall Risk Score:  `1 ? ?Depression screen PHQ 2/9 ? ? ?  10/22/2021  ?  3:15 PM 08/25/2021  ?  2:55 PM 07/21/2021  ?  2:20 PM 06/23/2021  ?  2:51 PM 05/29/2021  ?  1:18 PM 04/30/2021  ? 11:37 AM 02/06/2021  ?  3:13 PM  ?Depression screen PHQ 2/9  ?Decreased Interest 0 0 0 3 2 1  1   ?Down, Depressed, Hopeless 0 0 0 3 2 1 1   ?PHQ - 2 Score 0 0 0 6 4 2 2   ?Altered sleeping    1     ?Tired, decreased energy    3     ?Change in appetite    3     ?Feeling bad or failure about yourself     1     ?Trouble concentrating    3     ?Moving slowly or fidgety/restless    1     ?Suicidal thoughts    0     ?PHQ-9 Score    18     ?  ? ?Review of Systems  ?Musculoskeletal:  Positive for back pain.  ?     Joint stiffness  ? ?   ?Objective:  ? Physical Exam ?Vitals and nursing note reviewed.  ?Musculoskeletal:  ?   Comments: No Physical Exam Performed: My-Chart Video Visit  ? ? ? ? ?   ?Assessment & Plan:  ?1. Lumbar degenerative disc disease: 11/19/2021. ?Continue with current treatment regimen: Refilled : HYDROcodone 7.5/325 mg two tablets every 8 hours as needed #180.Second script sent to accommodate scheduled appointment. ?We will continue the opioid monitoring program, this consists of regular clinic visits, examinations, urine drug screen, pill counts as well as use of [place] Controlled Substance Reporting system. A 12 month History has been reviewed on the [place] Controlled Substance Reporting System on 11/19/2021  ?2. Thoracic Spondylosis: Continue current treatment with HEP and  current medication regime. 11/19/2021. 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 Continue to Monitor. 11/19/2021.  ?Lyrica discontinued due to adverse effects. Continue with exercise and heat therapy. 11/19/2021 ?4. Fibromyalgia:Continue with Heat and  exercise Regimen. Continue Gabapentin. 11/19/2021  ?5.Depression and anxiety: PCP Following. 11/19/2021 ?6. Orthostatic hypotension: Cardiology Following. 11/19/2021 ?7. Polyarthralgia: Continue to monitor. 04/192023 ?8. Chronic Bilateral  Shoulder Pain: Continue current medication regime. Continue HEP as Tolerated. 04/19//2023. ?9. Muscle Spasm:No complaints today.  Continue to monitor.   11/19/2021. ?10. Neuropathic Pain: Continue Gabapentin. Continue to Monitor.  11/19/2021 ? 11. Right elbow Pain: No complaints today. Continue current medication regimen. Continue to monitor. 11/19/2021 ?12. Right  Knee with OA:.Continue HEP as Tolerated. Continue current medication regimen. Continue to monitor. 11/19/2021 ? 13. Right Hip Pain: Continue with HEP as Tolerated. Continue to Monitor.  ? ?F/U in 1 month ? ?My-Chart Video Visit ?Established Patient ?Location of  Patient: [place] In [place] ?Location of Provider: In the Office  ? ?  ? ? ? ? ? ? ? ? ? ?

## 2021-12-22 DIAGNOSIS — J069 Acute upper respiratory infection, unspecified: Secondary | ICD-10-CM | POA: Diagnosis not present

## 2021-12-22 DIAGNOSIS — R070 Pain in throat: Secondary | ICD-10-CM | POA: Diagnosis not present

## 2021-12-22 DIAGNOSIS — H1033 Unspecified acute conjunctivitis, bilateral: Secondary | ICD-10-CM | POA: Diagnosis not present

## 2021-12-31 ENCOUNTER — Encounter: Payer: Self-pay | Admitting: Adult Health

## 2021-12-31 ENCOUNTER — Ambulatory Visit: Payer: Medicare Other | Admitting: Adult Health

## 2022-01-12 ENCOUNTER — Encounter: Payer: Medicare Other | Attending: Physical Medicine & Rehabilitation | Admitting: Registered Nurse

## 2022-01-12 ENCOUNTER — Encounter: Payer: Self-pay | Admitting: Registered Nurse

## 2022-01-12 VITALS — BP 112/80 | HR 102 | Ht 67.0 in | Wt 153.2 lb

## 2022-01-12 DIAGNOSIS — Z79899 Other long term (current) drug therapy: Secondary | ICD-10-CM | POA: Insufficient documentation

## 2022-01-12 DIAGNOSIS — G894 Chronic pain syndrome: Secondary | ICD-10-CM | POA: Insufficient documentation

## 2022-01-12 DIAGNOSIS — M542 Cervicalgia: Secondary | ICD-10-CM | POA: Diagnosis not present

## 2022-01-12 DIAGNOSIS — M545 Low back pain, unspecified: Secondary | ICD-10-CM | POA: Diagnosis not present

## 2022-01-12 DIAGNOSIS — M797 Fibromyalgia: Secondary | ICD-10-CM | POA: Insufficient documentation

## 2022-01-12 DIAGNOSIS — M4716 Other spondylosis with myelopathy, lumbar region: Secondary | ICD-10-CM | POA: Insufficient documentation

## 2022-01-12 DIAGNOSIS — Z5181 Encounter for therapeutic drug level monitoring: Secondary | ICD-10-CM | POA: Diagnosis not present

## 2022-01-12 DIAGNOSIS — M546 Pain in thoracic spine: Secondary | ICD-10-CM | POA: Insufficient documentation

## 2022-01-12 DIAGNOSIS — M2550 Pain in unspecified joint: Secondary | ICD-10-CM | POA: Insufficient documentation

## 2022-01-12 DIAGNOSIS — G8929 Other chronic pain: Secondary | ICD-10-CM | POA: Diagnosis not present

## 2022-01-12 MED ORDER — HYDROCODONE-ACETAMINOPHEN 7.5-325 MG PO TABS: 2.0000 | ORAL_TABLET | Freq: Three times a day (TID) | ORAL | 0 refills | Status: DC | PRN

## 2022-01-12 NOTE — Progress Notes (Signed)
Subjective:    Patient ID: [name], female    DOB: 1975/08/20, 46 y.o.   MRN: [id number]  HPI: [name] is a 46 y.o. female who returns for follow up appointment for chronic pain and medication refill. She states her pain is located in her neck, mid- lower back pain and generalized joint pain. She  rates her pain 6.Her  current exercise regime is walking and performing stretching exercises.  [name] Morphine equivalent is 45.00 MME.   UDS ordered today     Pain Inventory Average Pain 6 Pain Right Now 6 My pain is intermittent, sharp, burning, dull, stabbing, tingling, and aching  In the last 24 hours, has pain interfered with the following? General activity 7 Relation with others 8 Enjoyment of life 7 What TIME of day is your pain at its worst? morning , evening, and night Sleep (in general) Fair  Pain is worse with: walking, bending, sitting, inactivity, standing, and some activites Pain improves with: rest, heat/ice, and medication Relief from Meds: 5  Family History  Problem Relation Age of Onset   Diabetes type II Mother    Colon polyps Mother    Diverticulitis Mother    Diabetes Mother    Heart disease Mother    Osteoporosis Father    Diabetes type II Brother    Uterine cancer Other    Ovarian cancer Other    COPD Paternal Grandfather    Heart attack Maternal Grandmother    Social History   Socioeconomic History   Marital status: Married    Spouse name: Not on file   Number of children: 2   Years of education: Not on file   Highest education level: Not on file  Occupational History   Occupation: Disability    Employer: UNEMPLOYED  Tobacco Use   Smoking status: Former    Types: Cigarettes    Quit date: 06/30/2011    Years since quitting: 10.5   Smokeless tobacco: Never  Vaping Use   Vaping Use: Some days   Substances: Flavoring  Substance and Sexual Activity   Alcohol use: No    Alcohol/week: 0.0 standard drinks of alcohol   Drug  use: No   Sexual activity: Yes  Other Topics Concern   Not on file  Social History Narrative   Not on file   Social Determinants of Health   Financial Resource Strain: Not on file  Food Insecurity: Not on file  Transportation Needs: Not on file  Physical Activity: Not on file  Stress: Not on file  Social Connections: Not on file   Past Surgical History:  Procedure Laterality Date   CESAREAN SECTION     Left elbow surgery     PARTIAL HYSTERECTOMY     TONSILLECTOMY     Past Surgical History:  Procedure Laterality Date   CESAREAN SECTION     Left elbow surgery     PARTIAL HYSTERECTOMY     TONSILLECTOMY     Past Medical History:  Diagnosis Date   Anxiety    Chronic pain    Dr. Letta Pate   Degenerative disc disease    Spinal, small syrinx   Depression    Esophageal stricture    Status post dilatation   Fibromyalgia    GERD (gastroesophageal reflux disease)    Hiatal hernia    Internal hemorrhoids without mention of complication    Irritable bowel syndrome    Palpitations    No documented arrhythmias   Personal  history of colonic polyps 10/04/2000   HYPERPLASTIC POLYP   POTS (postural orthostatic tachycardia syndrome)    Syncope    Negative tilt table test   Syringomyelia and syringobulbia ([facility])    BP 112/80   Pulse (!) 102   Ht 5' 7"  (1.702 m)   Wt 153 lb 3.2 oz (69.5 kg)   SpO2 99%   BMI 23.99 kg/m   Opioid Risk Score:   Fall Risk Score:  `1  Depression screen [facility] 2/9     10/22/2021    3:15 PM 08/25/2021    2:55 PM 07/21/2021    2:20 PM 06/23/2021    2:51 PM 05/29/2021    1:18 PM 04/30/2021   11:37 AM 02/06/2021    3:13 PM  Depression screen PHQ 2/9  Decreased Interest 0 0 0 3 2 1 1   Down, Depressed, Hopeless 0 0 0 3 2 1 1   PHQ - 2 Score 0 0 0 6 4 2 2   Altered sleeping    1     Tired, decreased energy    3     Change in appetite    3     Feeling bad or failure about yourself     1     Trouble concentrating    3     Moving slowly or  fidgety/restless    1     Suicidal thoughts    0     PHQ-9 Score    18        Review of Systems  Constitutional: Negative.   HENT: Negative.    Eyes: Negative.   Respiratory: Negative.    Cardiovascular: Negative.   Gastrointestinal: Negative.   Endocrine: Negative.   Genitourinary: Negative.   Musculoskeletal: Negative.   Skin: Negative.   Allergic/Immunologic: Negative.   Neurological:  Positive for headaches.  Hematological: Negative.   Psychiatric/Behavioral: Negative.        Objective:   Physical Exam Vitals and nursing note reviewed.  Constitutional:      Appearance: Normal appearance.  Neck:     Comments: Cervical Paraspinal Tenderness: C-5-C-6 Cardiovascular:     Rate and Rhythm: Normal rate and regular rhythm.     Pulses: Normal pulses.     Heart sounds: Normal heart sounds.  Pulmonary:     Effort: Pulmonary effort is normal.     Breath sounds: Normal breath sounds.  Musculoskeletal:     Cervical back: Normal range of motion and neck supple.     Comments: Normal Muscle Bulk and Muscle Testing Reveals:  Upper Extremities: Full ROM and Muscle Strength 5/5 Thoracic Paraspinal Tenderness: T-7-T-9  Lumbar Paraspinal Tenderness: L-3-L-5 Lower Extremities: Full ROM and Muscle Strength 5/5 Arises from Table with ease Narrow Based  Gait     Skin:    General: Skin is warm and dry.  Neurological:     Mental Status: She is alert and oriented to person, place, and time.  Psychiatric:        Mood and Affect: Mood normal.        Behavior: Behavior normal.         Assessment & Plan:  1. Lumbar degenerative disc disease: 01/12/2022. Continue with current treatment regimen: Refilled : HYDROcodone 7.5/325 mg two tablets every 8 hours as needed #180. We will continue the opioid monitoring program, this consists of regular clinic visits, examinations, urine drug screen, pill counts as well as use of [place] Controlled Substance Reporting system. A 12 month  History  has been reviewed on the [place] Controlled Substance Reporting System on 01/12/2022  2. Thoracic Spondylosis: Continue current treatment with HEP and  current medication regime. 01/12/2022. 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 Continue to Monitor. 01/12/2022.  Lyrica discontinued due to adverse effects. Continue with exercise and heat therapy. 01/12/2022 4. Fibromyalgia:Continue with Heat and  exercise Regimen. Continue Gabapentin. 01/12/2022  5.Depression and anxiety: PCP Following. 01/12/2022 6. Orthostatic hypotension: Cardiology Following. 01/12/2022 7. Polyarthralgia: Continue to monitor. 06/122023 8. Chronic Bilateral  Shoulder Pain: Continue current medication regime. Continue HEP as Tolerated. 06/12//2023. 9. Muscle Spasm:No complaints today.  Continue to monitor.  01/12/2022. 10. Neuropathic Pain: Continue Gabapentin. Continue to Monitor.  01/12/2022  11. Right elbow Pain: No complaints today. Continue current medication regimen. Continue to monitor. 01/12/2022 12. Right  Knee with OA:.Continue HEP as Tolerated. Continue current medication regimen. Continue to monitor. 01/12/2022  13. Right Hip Pain: No complaints today. Continue with HEP as Tolerated. Continue to Monitor. 01/12/2022   F/U in 1 month

## 2022-01-16 LAB — TOXASSURE SELECT,+ANTIDEPR,UR

## 2022-01-19 ENCOUNTER — Telehealth: Payer: Self-pay | Admitting: *Deleted

## 2022-01-19 NOTE — Telephone Encounter (Signed)
Urine drug screen for this encounter is consistent for prescribed medication 

## 2022-02-09 ENCOUNTER — Encounter: Payer: Medicare Other | Attending: Physical Medicine & Rehabilitation | Admitting: Registered Nurse

## 2022-02-09 VITALS — BP 102/75 | HR 98 | Ht 67.0 in | Wt 151.2 lb

## 2022-02-09 DIAGNOSIS — Z79899 Other long term (current) drug therapy: Secondary | ICD-10-CM

## 2022-02-09 DIAGNOSIS — M797 Fibromyalgia: Secondary | ICD-10-CM

## 2022-02-09 DIAGNOSIS — M4716 Other spondylosis with myelopathy, lumbar region: Secondary | ICD-10-CM

## 2022-02-09 DIAGNOSIS — M542 Cervicalgia: Secondary | ICD-10-CM | POA: Diagnosis not present

## 2022-02-09 DIAGNOSIS — G894 Chronic pain syndrome: Secondary | ICD-10-CM

## 2022-02-09 DIAGNOSIS — M545 Low back pain, unspecified: Secondary | ICD-10-CM

## 2022-02-09 DIAGNOSIS — Z5181 Encounter for therapeutic drug level monitoring: Secondary | ICD-10-CM

## 2022-02-09 DIAGNOSIS — M2550 Pain in unspecified joint: Secondary | ICD-10-CM

## 2022-02-09 DIAGNOSIS — G8929 Other chronic pain: Secondary | ICD-10-CM | POA: Diagnosis not present

## 2022-02-09 MED ORDER — HYDROCODONE-ACETAMINOPHEN 7.5-325 MG PO TABS: 2.0000 | ORAL_TABLET | Freq: Three times a day (TID) | ORAL | 0 refills | Status: DC | PRN

## 2022-02-09 NOTE — Progress Notes (Signed)
Subjective:    Patient ID: [name], female    DOB: Sep 03, 1975, 46 y.o.   MRN: [id number]  HPI: [name] is a 46 y.o. female who returns for follow up appointment for chronic pain and medication refill. She states her pain is located in her neck, lower back pain radiating into her bilateral hips and generalized joint pain. She also reports her pain in her lower back increases with activity, she also states while she was sitting on the floor with her grands she felt a pulling in her lower back. She rates her pain 7. Her current exercise regime is walking and performing stretching exercises.  [name] reports at times she has noticed her feet at times turns purplish in color, when she is sitting on the couch. We discussed erythromelagia, she will F/U with her PCP. She verbalizes understanding.   [name] Morphine equivalent is 46.50 MME.   Last UDS was Performed on 01/12/2022, it was consistent.    Pain Inventory Average Pain 7 Pain Right Now 7 My pain is intermittent, sharp, burning, dull, stabbing, tingling, and aching  In the last 24 hours, has pain interfered with the following? General activity 7 Relation with others 8 Enjoyment of life 7 What TIME of day is your pain at its worst? morning  and night Sleep (in general) Fair  Pain is worse with: walking, bending, sitting, inactivity, standing, and some activites Pain improves with: rest, heat/ice, therapy/exercise, and medication Relief from Meds: 5  Family History  Problem Relation Age of Onset   Diabetes type II Mother    Colon polyps Mother    Diverticulitis Mother    Diabetes Mother    Heart disease Mother    Osteoporosis Father    Diabetes type II Brother    Uterine cancer Other    Ovarian cancer Other    COPD Paternal Grandfather    Heart attack Maternal Grandmother    Social History   Socioeconomic History   Marital status: Married    Spouse name: Not on file   Number of children: 2   Years  of education: Not on file   Highest education level: Not on file  Occupational History   Occupation: Disability    Employer: UNEMPLOYED  Tobacco Use   Smoking status: Former    Types: Cigarettes    Quit date: 06/30/2011    Years since quitting: 10.6   Smokeless tobacco: Never  Vaping Use   Vaping Use: Some days   Substances: Flavoring  Substance and Sexual Activity   Alcohol use: No    Alcohol/week: 0.0 standard drinks of alcohol   Drug use: No   Sexual activity: Yes  Other Topics Concern   Not on file  Social History Narrative   Not on file   Social Determinants of Health   Financial Resource Strain: Not on file  Food Insecurity: Not on file  Transportation Needs: Not on file  Physical Activity: Not on file  Stress: Not on file  Social Connections: Not on file   Past Surgical History:  Procedure Laterality Date   CESAREAN SECTION     Left elbow surgery     PARTIAL HYSTERECTOMY     TONSILLECTOMY     Past Surgical History:  Procedure Laterality Date   CESAREAN SECTION     Left elbow surgery     PARTIAL HYSTERECTOMY     TONSILLECTOMY     Past Medical History:  Diagnosis Date   Anxiety  Chronic pain    Dr. Letta Pate   Degenerative disc disease    Spinal, small syrinx   Depression    Esophageal stricture    Status post dilatation   Fibromyalgia    GERD (gastroesophageal reflux disease)    Hiatal hernia    Internal hemorrhoids without mention of complication    Irritable bowel syndrome    Palpitations    No documented arrhythmias   Personal history of colonic polyps 10/04/2000   HYPERPLASTIC POLYP   POTS (postural orthostatic tachycardia syndrome)    Syncope    Negative tilt table test   Syringomyelia and syringobulbia ([facility])    There were no vitals taken for this visit.  Opioid Risk Score:   Fall Risk Score:  `1  Depression screen [facility] 2/9     10/22/2021    3:15 PM 08/25/2021    2:55 PM 07/21/2021    2:20 PM 06/23/2021    2:51 PM 05/29/2021     1:18 PM 04/30/2021   11:37 AM 02/06/2021    3:13 PM  Depression screen PHQ 2/9  Decreased Interest 0 0 0 3 2 1 1   Down, Depressed, Hopeless 0 0 0 3 2 1 1   PHQ - 2 Score 0 0 0 6 4 2 2   Altered sleeping    1     Tired, decreased energy    3     Change in appetite    3     Feeling bad or failure about yourself     1     Trouble concentrating    3     Moving slowly or fidgety/restless    1     Suicidal thoughts    0     PHQ-9 Score    18         Review of Systems  Musculoskeletal:  Positive for back pain and neck pain.       Right knee pain Bilateral foot pain Bilateral hand pain  Neurological:  Positive for headaches.  All other systems reviewed and are negative.     Objective:   Physical Exam Vitals and nursing note reviewed.  Constitutional:      Appearance: Normal appearance.  Neck:     Comments: Cervical Paraspinal Tenderness: C-5-C-6 Cardiovascular:     Rate and Rhythm: Normal rate and regular rhythm.     Pulses: Normal pulses.     Heart sounds: Normal heart sounds.  Pulmonary:     Effort: Pulmonary effort is normal.     Breath sounds: Normal breath sounds.  Musculoskeletal:     Cervical back: Normal range of motion and neck supple.     Comments: Normal Muscle Bulk and Muscle Testing Reveals:  Upper Extremities: Full ROM and Muscle Strength 5/5 Right AC Joint Tenderness Thoracic Paraspinal Tenderness: T-1- T-3 Lower Extremities: Full ROM and Muscle Strength 5/5 Arises from Table with Ease Narrow Based  Gait     Skin:    General: Skin is warm and dry.  Neurological:     Mental Status: She is alert and oriented to person, place, and time.  Psychiatric:        Mood and Affect: Mood normal.        Behavior: Behavior normal.         Assessment & Plan:  1. Lumbar degenerative disc disease: 02/09/2022. Continue with current treatment regimen: Refilled : HYDROcodone 7.5/325 mg two tablets every 8 hours as needed #180. We will continue the opioid monitoring  program, this consists of regular clinic visits, examinations, urine drug screen, pill counts as well as use of [place] Controlled Substance Reporting system. A 12 month History has been reviewed on the [place] Controlled Substance Reporting System on 02/09/2022  2. Thoracic Spondylosis: Continue current treatment with HEP and  current medication regime. 02/09/2022. 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 Continue to Monitor. 02/09/2022.  Lyrica discontinued due to adverse effects. Continue with exercise and heat therapy. 02/09/2022 4. Fibromyalgia:Continue with Heat and  exercise Regimen. Continue Gabapentin. 02/09/2022  5.Depression and anxiety: PCP Following. 02/09/2022 6. Orthostatic hypotension: Cardiology Following. 02/09/2022 7. Polyarthralgia: Continue to monitor. 07/102023 8. Chronic Bilateral  Shoulder Pain: Continue current medication regime. Continue HEP as Tolerated. 07/10//2023. 9. Muscle Spasm:No complaints today.  Continue to monitor.  02/09/2022. 10. Neuropathic Pain: Continue Gabapentin. Continue to Monitor.  02/09/2022  11. Right elbow Pain: No complaints today. Continue current medication regimen. Continue to monitor. 02/09/2022 12. Right  Knee with OA:.Continue HEP as Tolerated. Continue current medication regimen. Continue to monitor. 02/09/2022  13. Right Hip Pain: No complaints today. Continue with HEP as Tolerated. Continue to Monitor. 02/09/2022 14. ?Erythromelalgia: F/U with Her PCP: She verbalizes understanding.    F/U in 1 month

## 2022-02-16 ENCOUNTER — Encounter: Payer: Self-pay | Admitting: Registered Nurse

## 2022-02-16 ENCOUNTER — Other Ambulatory Visit (HOSPITAL_COMMUNITY): Payer: Self-pay | Admitting: Internal Medicine

## 2022-02-16 DIAGNOSIS — Z1231 Encounter for screening mammogram for malignant neoplasm of breast: Secondary | ICD-10-CM

## 2022-02-18 ENCOUNTER — Ambulatory Visit (HOSPITAL_COMMUNITY): Payer: Medicare Other

## 2022-02-18 ENCOUNTER — Telehealth: Payer: Self-pay | Admitting: Registered Nurse

## 2022-02-18 ENCOUNTER — Ambulatory Visit (HOSPITAL_COMMUNITY)
Admission: RE | Admit: 2022-02-18 | Discharge: 2022-02-18 | Disposition: A | Discharge status: Home / Self Care | Payer: Medicare Other | Source: Ambulatory Visit | Attending: Registered Nurse | Admitting: Registered Nurse

## 2022-02-18 ENCOUNTER — Ambulatory Visit (HOSPITAL_COMMUNITY)
Admission: RE | Admit: 2022-02-18 | Discharge: 2022-02-18 | Disposition: A | Discharge status: Home / Self Care | Payer: Medicare Other | Source: Ambulatory Visit | Attending: Internal Medicine | Admitting: Internal Medicine

## 2022-02-18 DIAGNOSIS — G8929 Other chronic pain: Secondary | ICD-10-CM | POA: Insufficient documentation

## 2022-02-18 DIAGNOSIS — Z1231 Encounter for screening mammogram for malignant neoplasm of breast: Secondary | ICD-10-CM | POA: Insufficient documentation

## 2022-02-18 DIAGNOSIS — M545 Low back pain, unspecified: Secondary | ICD-10-CM | POA: Diagnosis not present

## 2022-02-18 NOTE — Telephone Encounter (Signed)
X-ray reviewed.  Placed a call to Ms. Rayburn no answer, left message to return the call.

## 2022-02-23 ENCOUNTER — Other Ambulatory Visit (HOSPITAL_COMMUNITY): Payer: Self-pay | Admitting: Internal Medicine

## 2022-02-23 DIAGNOSIS — R928 Other abnormal and inconclusive findings on diagnostic imaging of breast: Secondary | ICD-10-CM

## 2022-02-24 ENCOUNTER — Ambulatory Visit (HOSPITAL_COMMUNITY)
Admission: RE | Admit: 2022-02-24 | Discharge: 2022-02-24 | Disposition: A | Discharge status: Home / Self Care | Payer: Medicare Other | Source: Ambulatory Visit | Attending: Internal Medicine | Admitting: Internal Medicine

## 2022-02-24 DIAGNOSIS — R928 Other abnormal and inconclusive findings on diagnostic imaging of breast: Secondary | ICD-10-CM | POA: Insufficient documentation

## 2022-02-24 DIAGNOSIS — N6489 Other specified disorders of breast: Secondary | ICD-10-CM | POA: Diagnosis not present

## 2022-03-03 ENCOUNTER — Telehealth: Payer: Self-pay | Admitting: Registered Nurse

## 2022-03-03 DIAGNOSIS — M545 Low back pain, unspecified: Secondary | ICD-10-CM

## 2022-03-03 DIAGNOSIS — M5416 Radiculopathy, lumbar region: Secondary | ICD-10-CM

## 2022-03-03 MED ORDER — METHYLPREDNISOLONE 4 MG PO TBPK: ORAL_TABLET | ORAL | 0 refills | Status: DC

## 2022-03-03 NOTE — Telephone Encounter (Signed)
Patient is needing a call back from [place].

## 2022-03-03 NOTE — Telephone Encounter (Signed)
X-ray Results Reviewed Placed a call to Ms. Holz regarding the above, No answer, left message to return the call.

## 2022-03-03 NOTE — Telephone Encounter (Signed)
Ms. Mangione called this provider reporting increase intensity of lower back pain, with no relief with current medication regimen. Lumbar X-ray was reviewed. She denies falling. Spoke with Dr Wynn Banker he agrees with MRI WO contrast. Order placed.  Medrol dose pak sent to pharmacy. She has a scheduled appointment with Dr Wynn Banker on 03/06/2310:45 , be at the office 11:30.  She was instructed to go to the emergency room if pain is unbearable, she verbalizes understanding.  Spoke to [place] in detail about taking care of herself, she missed her neurology appointment, she seen Dr Lovell Sheehan in the past and she states she didn't understand what he was explaining, she was instructed months ago to call Dr Lovell Sheehan and schedule an appointment for explanation, at that time she verbalizes understanding. She never called Dr Lovell Sheehan.

## 2022-03-06 ENCOUNTER — Telehealth: Payer: Self-pay | Admitting: Registered Nurse

## 2022-03-06 ENCOUNTER — Ambulatory Visit (HOSPITAL_COMMUNITY)
Admission: RE | Admit: 2022-03-06 | Discharge: 2022-03-06 | Disposition: A | Discharge status: Home / Self Care | Payer: Medicare Other | Source: Ambulatory Visit | Attending: Registered Nurse | Admitting: Registered Nurse

## 2022-03-06 ENCOUNTER — Encounter: Payer: Medicare Other | Admitting: Physical Medicine & Rehabilitation

## 2022-03-06 DIAGNOSIS — G8929 Other chronic pain: Secondary | ICD-10-CM | POA: Insufficient documentation

## 2022-03-06 DIAGNOSIS — M5416 Radiculopathy, lumbar region: Secondary | ICD-10-CM | POA: Insufficient documentation

## 2022-03-06 DIAGNOSIS — M48061 Spinal stenosis, lumbar region without neurogenic claudication: Secondary | ICD-10-CM | POA: Diagnosis not present

## 2022-03-06 DIAGNOSIS — M545 Low back pain, unspecified: Secondary | ICD-10-CM | POA: Diagnosis not present

## 2022-03-06 MED ORDER — DIAZEPAM 10 MG PO TABS: 10.0000 mg | ORAL_TABLET | Freq: Once | ORAL | 0 refills | Status: AC

## 2022-03-06 NOTE — Telephone Encounter (Signed)
[name] called: Spoke with Dr Wynn Banker Valium 10 mg 30 minutes - hour prior to MRI.  Her husband will be driving her to the MRI. She reports her pain has increased in intensity with no relief with her current medication regimen.  Her hydrocodone was increase to every 6 hours, two tablets every 6 hours as needed for pain. and she was instructed to call office on Monday, she verbalizes understanding.  She was instructed to call Dr Lovell Sheehan office to schedule an appointment, and to informed him she is scheduled for MRI this evening, she verbalizes understanding.

## 2022-03-09 ENCOUNTER — Telehealth: Payer: Self-pay | Admitting: *Deleted

## 2022-03-09 ENCOUNTER — Telehealth: Payer: Self-pay | Admitting: Registered Nurse

## 2022-03-09 ENCOUNTER — Encounter: Payer: Medicare Other | Admitting: Registered Nurse

## 2022-03-09 MED ORDER — HYDROCODONE-ACETAMINOPHEN 7.5-325 MG PO TABS: 2.0000 | ORAL_TABLET | Freq: Three times a day (TID) | ORAL | 0 refills | Status: DC | PRN

## 2022-03-09 NOTE — Telephone Encounter (Signed)
Message sent through mychart : After I spoke with Merry Proud about rescheduling appt:  We have rescheduled you for next Wednesday 8/16 @3 :20 arriive by 3 pm. will talke with Dr Riley Lam when he returns to the office about your MRI, and I have let her know you are waiting on a call back from your neurosurgeon.   Thank you, Have a nice day    Sbyil Rn/Eunice NP

## 2022-03-09 NOTE — Telephone Encounter (Signed)
[name] appointment was re-scheduled PMP was Reviewed.  Hydrocodone e-scribed.  [name] is aware.

## 2022-03-10 ENCOUNTER — Telehealth: Payer: Self-pay | Admitting: Registered Nurse

## 2022-03-10 ENCOUNTER — Other Ambulatory Visit (HOSPITAL_COMMUNITY): Payer: Medicare Other

## 2022-03-10 ENCOUNTER — Encounter (HOSPITAL_COMMUNITY): Payer: Medicare Other

## 2022-03-10 NOTE — Telephone Encounter (Signed)
Reviewed. MRI results with Dr Berline Chough  Placed a call to [facility], no answer, Left message to return the call.

## 2022-03-10 NOTE — Telephone Encounter (Signed)
Placed a call to [name], no answer. Left message to return the call. 

## 2022-03-10 NOTE — Telephone Encounter (Signed)
Spoke with [name] regarding her MRI results and Dr Berline Chough input, she verbalizes understanding.  She states she will call Dr Lovell Sheehan office again to schedule an appointment.

## 2022-03-17 ENCOUNTER — Encounter: Payer: Self-pay | Admitting: Registered Nurse

## 2022-03-17 ENCOUNTER — Encounter: Payer: Medicare Other | Attending: Physical Medicine & Rehabilitation | Admitting: Registered Nurse

## 2022-03-17 VITALS — BP 102/70 | HR 87 | Ht 67.0 in | Wt 154.6 lb

## 2022-03-17 DIAGNOSIS — M797 Fibromyalgia: Secondary | ICD-10-CM | POA: Diagnosis not present

## 2022-03-17 DIAGNOSIS — Z79899 Other long term (current) drug therapy: Secondary | ICD-10-CM | POA: Diagnosis not present

## 2022-03-17 DIAGNOSIS — G8929 Other chronic pain: Secondary | ICD-10-CM | POA: Insufficient documentation

## 2022-03-17 DIAGNOSIS — M2550 Pain in unspecified joint: Secondary | ICD-10-CM | POA: Diagnosis not present

## 2022-03-17 DIAGNOSIS — M5116 Intervertebral disc disorders with radiculopathy, lumbar region: Secondary | ICD-10-CM | POA: Diagnosis not present

## 2022-03-17 DIAGNOSIS — Z5181 Encounter for therapeutic drug level monitoring: Secondary | ICD-10-CM | POA: Diagnosis not present

## 2022-03-17 DIAGNOSIS — G894 Chronic pain syndrome: Secondary | ICD-10-CM | POA: Insufficient documentation

## 2022-03-17 DIAGNOSIS — M545 Low back pain, unspecified: Secondary | ICD-10-CM | POA: Diagnosis not present

## 2022-03-17 MED ORDER — GABAPENTIN 100 MG PO CAPS: ORAL_CAPSULE | ORAL | 3 refills | Status: DC

## 2022-03-17 MED ORDER — HYDROCODONE-ACETAMINOPHEN 7.5-325 MG PO TABS: 2.0000 | ORAL_TABLET | Freq: Three times a day (TID) | ORAL | 0 refills | Status: DC | PRN

## 2022-03-17 NOTE — Progress Notes (Signed)
Subjective:    Patient ID: [name], female    DOB: [birth year], 46 y.o.   MRN: [id number]  HPI: [name] is a 46 y.o. female who returns for follow up appointment for chronic pain and medication refill. She states her pain is located in her lower back. She rates her pain 7. Her current exercise regime is walking and performing stretching exercises.  Ms. Eppolito states she seen Dr Lovell Sheehan today, we will request Dr Lovell Sheehan note, she verbalizes understanding.   Ms. Pieratt Morphine equivalent is 45.00 MME..  Last UDS was Performed on 01/12/2022, it was consistent.    Pain Inventory Average Pain 7 Pain Right Now 7 My pain is intermittent, sharp, burning, dull, stabbing, tingling, and aching  In the last 24 hours, has pain interfered with the following? General activity 7 Relation with others 7 Enjoyment of life 7 What TIME of day is your pain at its worst? morning , evening, and night Sleep (in general) Fair  Pain is worse with: walking, bending, sitting, inactivity, standing, unsure, and some activites Pain improves with: rest, heat/ice, and medication Relief from Meds: 5  Family History  Problem Relation Age of Onset   Diabetes type II Mother    Colon polyps Mother    Diverticulitis Mother    Diabetes Mother    Heart disease Mother    Osteoporosis Father    Diabetes type II Brother    Uterine cancer Other    Ovarian cancer Other    COPD Paternal Grandfather    Heart attack Maternal Grandmother    Social History   Socioeconomic History   Marital status: Married    Spouse name: Not on file   Number of children: 2   Years of education: Not on file   Highest education level: Not on file  Occupational History   Occupation: Disability    Employer: UNEMPLOYED  Tobacco Use   Smoking status: Former    Types: Cigarettes    Quit date: 06/30/2011    Years since quitting: 10.7   Smokeless tobacco: Never  Vaping Use   Vaping Use: Some days   Substances:  Flavoring  Substance and Sexual Activity   Alcohol use: No    Alcohol/week: 0.0 standard drinks of alcohol   Drug use: No   Sexual activity: Yes  Other Topics Concern   Not on file  Social History Narrative   Not on file   Social Determinants of Health   Financial Resource Strain: Not on file  Food Insecurity: Not on file  Transportation Needs: Not on file  Physical Activity: Not on file  Stress: Not on file  Social Connections: Not on file   Past Surgical History:  Procedure Laterality Date   CESAREAN SECTION     Left elbow surgery     PARTIAL HYSTERECTOMY     TONSILLECTOMY     Past Surgical History:  Procedure Laterality Date   CESAREAN SECTION     Left elbow surgery     PARTIAL HYSTERECTOMY     TONSILLECTOMY     Past Medical History:  Diagnosis Date   Anxiety    Chronic pain    Dr. Wynn Banker   Degenerative disc disease    Spinal, small syrinx   Depression    Esophageal stricture    Status post dilatation   Fibromyalgia    GERD (gastroesophageal reflux disease)    Hiatal hernia    Internal hemorrhoids without mention of complication  Irritable bowel syndrome    Palpitations    No documented arrhythmias   Personal history of colonic polyps 10/04/2000   HYPERPLASTIC POLYP   POTS (postural orthostatic tachycardia syndrome)    Syncope    Negative tilt table test   Syringomyelia and syringobulbia (HCC)    BP 102/70   Pulse 87   Ht 5\' 7"  (1.702 m)   Wt 154 lb 9.6 oz (70.1 kg)   SpO2 99%   BMI 24.21 kg/m   Opioid Risk Score:   Fall Risk Score:  `1  Depression screen [facility] 2/9     03/17/2022   10:44 AM 10/22/2021    3:15 PM 08/25/2021    2:55 PM 07/21/2021    2:20 PM 06/23/2021    2:51 PM 05/29/2021    1:18 PM 04/30/2021   11:37 AM  Depression screen PHQ 2/9  Decreased Interest 1 0 0 0 3 2 1   Down, Depressed, Hopeless 1 0 0 0 3 2 1   PHQ - 2 Score 2 0 0 0 6 4 2   Altered sleeping     1    Tired, decreased energy     3    Change in appetite     3     Feeling bad or failure about yourself      1    Trouble concentrating     3    Moving slowly or fidgety/restless     1    Suicidal thoughts     0    PHQ-9 Score     18       Review of Systems  Musculoskeletal:  Positive for back pain and neck pain.       Right shoulder pain Right elbow pain Right hand pain Right knee pain Bilateral foot pain  All other systems reviewed and are negative.     Objective:   Physical Exam Vitals and nursing note reviewed.  Constitutional:      Appearance: Normal appearance.  Cardiovascular:     Rate and Rhythm: Normal rate and regular rhythm.     Pulses: Normal pulses.     Heart sounds: Normal heart sounds.  Pulmonary:     Effort: Pulmonary effort is normal.     Breath sounds: Normal breath sounds.  Musculoskeletal:     Cervical back: Normal range of motion and neck supple.     Comments: Normal Muscle Bulk and Muscle Testing Reveals:  Upper Extremities:Full  ROM and Muscle Strength 5/5 Lumbar Paraspinal Tenderness: L-3-L-5 Lower Extremities: Full ROM and Muscle Strength 5/5 Arises from Table with ease Narrow Based  Gait     Skin:    General: Skin is warm and dry.  Neurological:     Mental Status: She is alert and oriented to person, place, and time.  Psychiatric:        Mood and Affect: Mood normal.        Behavior: Behavior normal.         Assessment & Plan:  1. Lumbar degenerative disc disease: 03/17/2022. Continue with current treatment regimen: Refilled : HYDROcodone 7.5/325 mg two tablets every 8 hours as needed #180. We will continue the opioid monitoring program, this consists of regular clinic visits, examinations, urine drug screen, pill counts as well as use of Controlled Substance Reporting system. A 12 month History has been reviewed on the Controlled Substance Reporting System on 03/17/2022  2. Thoracic Spondylosis: Continue current treatment with HEP and  current  medication regime.  03/17/2022. 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 Continue to Monitor. 03/17/2022.  Lyrica discontinued due to adverse effects. Continue with exercise and heat therapy. 03/17/2022 4. Fibromyalgia:Continue with Heat and  exercise Regimen. Continue Gabapentin. 03/17/2022  5.Depression and anxiety: PCP Following. 03/17/2022 6. Orthostatic hypotension: Cardiology Following. 03/17/2022 7. Polyarthralgia: Continue to monitor. 08/152023 8. Chronic Bilateral  Shoulder Pain: No complaints today. Continue current medication regime. Continue HEP as Tolerated. 08/15//2023. 9. Muscle Spasm:No complaints today.  Continue to monitor.  03/17/2022. 10. Neuropathic Pain: Continue Gabapentin. Continue to Monitor.  03/17/2022  11. Right elbow Pain: No complaints today. Continue current medication regimen. Continue to monitor. 03/17/2022 12. Right  Knee with OA:.Continue HEP as Tolerated. Continue current medication regimen. Continue to monitor. 03/17/2022  13. Right Hip Pain: No complaints today. Continue with HEP as Tolerated. Continue to Monitor. 03/17/2022   F/U in 1 month

## 2022-03-18 ENCOUNTER — Encounter: Payer: Medicare Other | Admitting: Registered Nurse

## 2022-03-20 ENCOUNTER — Telehealth: Payer: Self-pay | Admitting: Physical Medicine & Rehabilitation

## 2022-03-20 NOTE — Telephone Encounter (Signed)
Dr. Wynn Banker wanted to get patient scheduled for a Left L4-5 Trans Lesi, but patient said her back pain is better now.  Doesn't want to schedule right now.

## 2022-03-22 ENCOUNTER — Other Ambulatory Visit: Payer: Self-pay | Admitting: Neurology

## 2022-03-25 ENCOUNTER — Telehealth: Payer: Self-pay

## 2022-03-25 ENCOUNTER — Other Ambulatory Visit: Payer: Self-pay | Admitting: Internal Medicine

## 2022-03-25 ENCOUNTER — Other Ambulatory Visit (HOSPITAL_COMMUNITY): Payer: Self-pay | Admitting: Internal Medicine

## 2022-03-25 DIAGNOSIS — I10 Essential (primary) hypertension: Secondary | ICD-10-CM | POA: Diagnosis not present

## 2022-03-25 DIAGNOSIS — M8580 Other specified disorders of bone density and structure, unspecified site: Secondary | ICD-10-CM

## 2022-03-25 DIAGNOSIS — E559 Vitamin D deficiency, unspecified: Secondary | ICD-10-CM | POA: Diagnosis not present

## 2022-03-25 DIAGNOSIS — M797 Fibromyalgia: Secondary | ICD-10-CM | POA: Diagnosis not present

## 2022-03-25 DIAGNOSIS — M79672 Pain in left foot: Secondary | ICD-10-CM

## 2022-03-25 DIAGNOSIS — M79671 Pain in right foot: Secondary | ICD-10-CM | POA: Diagnosis not present

## 2022-03-25 DIAGNOSIS — Z0001 Encounter for general adult medical examination with abnormal findings: Secondary | ICD-10-CM | POA: Diagnosis not present

## 2022-03-25 NOTE — Telephone Encounter (Signed)
Pharmacy is aware of Riley Lam NP reply.

## 2022-03-25 NOTE — Telephone Encounter (Signed)
[name] from [facility] called:    Primus Bravo is receiving Adderall & Clonazepam from Dr. [name]. She wanted to inform you because you prescribe her pain medications.  Call back phone [phone].

## 2022-03-30 ENCOUNTER — Ambulatory Visit (HOSPITAL_COMMUNITY)
Admission: RE | Admit: 2022-03-30 | Discharge: 2022-03-30 | Disposition: A | Discharge status: Home / Self Care | Payer: Medicare Other | Source: Ambulatory Visit | Attending: Internal Medicine | Admitting: Internal Medicine

## 2022-03-30 DIAGNOSIS — M79672 Pain in left foot: Secondary | ICD-10-CM | POA: Diagnosis not present

## 2022-03-30 DIAGNOSIS — M79671 Pain in right foot: Secondary | ICD-10-CM | POA: Diagnosis not present

## 2022-03-30 DIAGNOSIS — R0989 Other specified symptoms and signs involving the circulatory and respiratory systems: Secondary | ICD-10-CM | POA: Diagnosis not present

## 2022-04-23 ENCOUNTER — Ambulatory Visit: Payer: Medicare Other | Admitting: Physical Medicine & Rehabilitation

## 2022-05-07 ENCOUNTER — Encounter: Payer: Medicare Other | Admitting: Registered Nurse

## 2022-05-08 ENCOUNTER — Encounter: Payer: Self-pay | Admitting: Registered Nurse

## 2022-05-08 ENCOUNTER — Encounter: Payer: Medicare Other | Attending: Physical Medicine & Rehabilitation | Admitting: Registered Nurse

## 2022-05-08 VITALS — BP 128/88 | HR 100 | Ht 67.0 in | Wt 155.4 lb

## 2022-05-08 DIAGNOSIS — M542 Cervicalgia: Secondary | ICD-10-CM

## 2022-05-08 DIAGNOSIS — M4716 Other spondylosis with myelopathy, lumbar region: Secondary | ICD-10-CM | POA: Diagnosis not present

## 2022-05-08 DIAGNOSIS — Z5181 Encounter for therapeutic drug level monitoring: Secondary | ICD-10-CM

## 2022-05-08 DIAGNOSIS — G894 Chronic pain syndrome: Secondary | ICD-10-CM | POA: Diagnosis not present

## 2022-05-08 DIAGNOSIS — M545 Low back pain, unspecified: Secondary | ICD-10-CM

## 2022-05-08 DIAGNOSIS — Z79891 Long term (current) use of opiate analgesic: Secondary | ICD-10-CM

## 2022-05-08 DIAGNOSIS — Z79899 Other long term (current) drug therapy: Secondary | ICD-10-CM | POA: Diagnosis not present

## 2022-05-08 DIAGNOSIS — G8929 Other chronic pain: Secondary | ICD-10-CM | POA: Diagnosis not present

## 2022-05-08 DIAGNOSIS — M797 Fibromyalgia: Secondary | ICD-10-CM | POA: Diagnosis not present

## 2022-05-08 DIAGNOSIS — M546 Pain in thoracic spine: Secondary | ICD-10-CM | POA: Diagnosis not present

## 2022-05-08 DIAGNOSIS — M2550 Pain in unspecified joint: Secondary | ICD-10-CM

## 2022-05-08 MED ORDER — HYDROCODONE-ACETAMINOPHEN 7.5-325 MG PO TABS: 2.0000 | ORAL_TABLET | Freq: Three times a day (TID) | ORAL | 0 refills | Status: DC | PRN

## 2022-05-08 NOTE — Progress Notes (Signed)
Subjective:    Patient ID: [name], female    DOB: 26-Jun-1976, 46 y.o.   MRN: [id number]  HPI: [name] is a 46 y.o. female who returns for follow up appointment for chronic pain and medication refill. She states her  pain is located in her neck and mid- lower back, she describes her pain as deep pain..She rates her pain 6. Her current exercise regime is walking and performing stretching exercises.  Ms. Ritchey Morphine equivalent is 45.00   MME.   UDS ordered today.    Pain Inventory Average Pain 7 Pain Right Now 6 My pain is intermittent, sharp, burning, dull, stabbing, tingling, and aching  In the last 24 hours, has pain interfered with the following? General activity 7 Relation with others 7 Enjoyment of life 7 What TIME of day is your pain at its worst? morning , evening, and night Sleep (in general) Fair  Pain is worse with: walking, bending, sitting, inactivity, standing, and some activites Pain improves with: rest, heat/ice, and medication Relief from Meds: 6  Family History  Problem Relation Age of Onset   Diabetes type II Mother    Colon polyps Mother    Diverticulitis Mother    Diabetes Mother    Heart disease Mother    Osteoporosis Father    Diabetes type II Brother    Uterine cancer Other    Ovarian cancer Other    COPD Paternal Grandfather    Heart attack Maternal Grandmother    Social History   Socioeconomic History   Marital status: Married    Spouse name: Not on file   Number of children: 2   Years of education: Not on file   Highest education level: Not on file  Occupational History   Occupation: Disability    Employer: UNEMPLOYED  Tobacco Use   Smoking status: Former    Types: Cigarettes    Quit date: 06/30/2011    Years since quitting: 10.8   Smokeless tobacco: Never  Vaping Use   Vaping Use: Some days   Substances: Flavoring  Substance and Sexual Activity   Alcohol use: No    Alcohol/week: 0.0 standard drinks of alcohol    Drug use: No   Sexual activity: Yes  Other Topics Concern   Not on file  Social History Narrative   Not on file   Social Determinants of Health   Financial Resource Strain: Not on file  Food Insecurity: Not on file  Transportation Needs: Not on file  Physical Activity: Not on file  Stress: Not on file  Social Connections: Not on file   Past Surgical History:  Procedure Laterality Date   CESAREAN SECTION     Left elbow surgery     PARTIAL HYSTERECTOMY     TONSILLECTOMY     Past Surgical History:  Procedure Laterality Date   CESAREAN SECTION     Left elbow surgery     PARTIAL HYSTERECTOMY     TONSILLECTOMY     Past Medical History:  Diagnosis Date   Anxiety    Chronic pain    Dr. Letta Pate   Degenerative disc disease    Spinal, small syrinx   Depression    Esophageal stricture    Status post dilatation   Fibromyalgia    GERD (gastroesophageal reflux disease)    Hiatal hernia    Internal hemorrhoids without mention of complication    Irritable bowel syndrome    Palpitations    No documented arrhythmias  Personal history of colonic polyps 10/04/2000   HYPERPLASTIC POLYP   POTS (postural orthostatic tachycardia syndrome)    Syncope    Negative tilt table test   Syringomyelia and syringobulbia (HCC)    BP 128/88   Pulse (!) 102   Ht 5\' 7"  (1.702 m)   Wt 155 lb 6.4 oz (70.5 kg)   SpO2 98%   BMI 24.34 kg/m   Opioid Risk Score:   Fall Risk Score:  `1  Depression screen [facility] 2/9     03/17/2022   10:44 AM 10/22/2021    3:15 PM 08/25/2021    2:55 PM 07/21/2021    2:20 PM 06/23/2021    2:51 PM 05/29/2021    1:18 PM 04/30/2021   11:37 AM  Depression screen PHQ 2/9  Decreased Interest 1 0 0 0 3 2 1   Down, Depressed, Hopeless 1 0 0 0 3 2 1   PHQ - 2 Score 2 0 0 0 6 4 2   Altered sleeping     1    Tired, decreased energy     3    Change in appetite     3    Feeling bad or failure about yourself      1    Trouble concentrating     3    Moving slowly or  fidgety/restless     1    Suicidal thoughts     0    PHQ-9 Score     18        Review of Systems  Musculoskeletal:  Positive for back pain.       Bilateral shoulder pain Bilateral hip pain Bilateral foot pain Bilateral hand pain  Neurological:  Positive for headaches.  All other systems reviewed and are negative.     Objective:   Physical Exam Vitals and nursing note reviewed.  Constitutional:      Appearance: Normal appearance.  Cardiovascular:     Rate and Rhythm: Normal rate and regular rhythm.  Pulmonary:     Effort: Pulmonary effort is normal.     Breath sounds: Normal breath sounds.  Musculoskeletal:     Cervical back: Normal range of motion and neck supple.     Comments: Normal Muscle Bulk and Muscle Testing Reveals:  Upper Extremities: Full ROM and Muscle Strength 5/5 Thoracic Paraspinal Tenderness: T-7-T-9 Lumbar Paraspinal Tenderness: L-3-L-5 Lower Extremities: Full ROM and Muscle Strength 5/5 Arises from chair with ease Narrow Based  Gait     Skin:    General: Skin is warm and dry.  Neurological:     Mental Status: She is alert and oriented to person, place, and time.  Psychiatric:        Mood and Affect: Mood normal.        Behavior: Behavior normal.         Assessment & Plan:  1. Lumbar degenerative disc disease: 05/08/2022. Continue with current treatment regimen: Refilled : HYDROcodone 7.5/325 mg two tablets every 8 hours as needed #180. We will continue the opioid monitoring program, this consists of regular clinic visits, examinations, urine drug screen, pill counts as well as use of Controlled Substance Reporting system. A 12 month History has been reviewed on the Controlled Substance Reporting System on 05/08/2022  2. Thoracic Spondylosis: Continue current treatment with HEP and  current medication regime. 05/08/2022. 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 Continue to Monitor. 05/08/2022.  Lyrica discontinued due to  adverse effects. Continue with exercise and heat therapy. 05/08/2022 4. Fibromyalgia:Continue with Heat and  exercise Regimen. Continue Gabapentin. 05/08/2022  5.Depression and anxiety: PCP Following. 05/08/2022 6. Orthostatic hypotension: Cardiology Following. 05/08/2022 7. Polyarthralgia: Continue to monitor. 10/062023 8. Chronic Bilateral  Shoulder Pain: No complaints today. Continue current medication regime. Continue HEP as Tolerated. 10/06//2023. 9. Muscle Spasm:No complaints today.  Continue to monitor.  05/08/2022. 10. Neuropathic Pain: Continue Gabapentin. Continue to Monitor.  05/08/2022  11. Right elbow Pain: No complaints today. Continue current medication regimen. Continue to monitor. 05/08/2022 12. Right  Knee with OA:.Continue HEP as Tolerated. Continue current medication regimen. Continue to monitor. 05/08/2022  13. Right Hip Pain: No complaints today. Continue with HEP as Tolerated. Continue to Monitor. 05/08/2022   F/U in 1 month

## 2022-05-13 LAB — TOXASSURE SELECT,+ANTIDEPR,UR

## 2022-05-19 ENCOUNTER — Telehealth: Payer: Self-pay | Admitting: *Deleted

## 2022-05-19 NOTE — Telephone Encounter (Signed)
Urine drug screen for this encounter is consistent for prescribed medication 

## 2022-06-05 ENCOUNTER — Encounter: Payer: Medicare Other | Admitting: Registered Nurse

## 2022-06-05 ENCOUNTER — Telehealth: Payer: Self-pay | Admitting: Registered Nurse

## 2022-06-05 MED ORDER — HYDROCODONE-ACETAMINOPHEN 7.5-325 MG PO TABS: 2.0000 | ORAL_TABLET | Freq: Three times a day (TID) | ORAL | 0 refills | Status: DC | PRN

## 2022-06-05 NOTE — Telephone Encounter (Signed)
PMP was Reviewed.  Hydrocodone e-scribed today. [name] is aware of the above, her appointment changed .

## 2022-06-19 ENCOUNTER — Encounter: Payer: Self-pay | Admitting: Registered Nurse

## 2022-06-19 ENCOUNTER — Encounter: Payer: Medicare Other | Attending: Physical Medicine & Rehabilitation | Admitting: Registered Nurse

## 2022-06-19 VITALS — BP 123/82 | HR 80 | Ht 67.0 in | Wt 152.8 lb

## 2022-06-19 DIAGNOSIS — M542 Cervicalgia: Secondary | ICD-10-CM | POA: Insufficient documentation

## 2022-06-19 DIAGNOSIS — M545 Low back pain, unspecified: Secondary | ICD-10-CM | POA: Insufficient documentation

## 2022-06-19 DIAGNOSIS — M25512 Pain in left shoulder: Secondary | ICD-10-CM | POA: Diagnosis not present

## 2022-06-19 DIAGNOSIS — M2550 Pain in unspecified joint: Secondary | ICD-10-CM | POA: Diagnosis not present

## 2022-06-19 DIAGNOSIS — M25511 Pain in right shoulder: Secondary | ICD-10-CM | POA: Diagnosis not present

## 2022-06-19 DIAGNOSIS — G8929 Other chronic pain: Secondary | ICD-10-CM | POA: Diagnosis not present

## 2022-06-19 DIAGNOSIS — M47812 Spondylosis without myelopathy or radiculopathy, cervical region: Secondary | ICD-10-CM | POA: Insufficient documentation

## 2022-06-19 DIAGNOSIS — M546 Pain in thoracic spine: Secondary | ICD-10-CM | POA: Insufficient documentation

## 2022-06-19 DIAGNOSIS — M4716 Other spondylosis with myelopathy, lumbar region: Secondary | ICD-10-CM | POA: Diagnosis not present

## 2022-06-19 DIAGNOSIS — M5412 Radiculopathy, cervical region: Secondary | ICD-10-CM | POA: Insufficient documentation

## 2022-06-19 DIAGNOSIS — G894 Chronic pain syndrome: Secondary | ICD-10-CM | POA: Diagnosis not present

## 2022-06-19 DIAGNOSIS — Z79891 Long term (current) use of opiate analgesic: Secondary | ICD-10-CM | POA: Insufficient documentation

## 2022-06-19 DIAGNOSIS — Z5181 Encounter for therapeutic drug level monitoring: Secondary | ICD-10-CM | POA: Diagnosis not present

## 2022-06-19 DIAGNOSIS — M797 Fibromyalgia: Secondary | ICD-10-CM | POA: Insufficient documentation

## 2022-06-19 MED ORDER — GABAPENTIN 100 MG PO CAPS: ORAL_CAPSULE | ORAL | 3 refills | Status: DC

## 2022-06-19 MED ORDER — HYDROCODONE-ACETAMINOPHEN 7.5-325 MG PO TABS: 2.0000 | ORAL_TABLET | Freq: Three times a day (TID) | ORAL | 0 refills | Status: DC | PRN

## 2022-06-19 NOTE — Progress Notes (Unsigned)
Subjective:    Patient ID: [name], female    DOB: [birth year], 46 y.o.   MRN: [id number]  HPI: [name] is a 46 y.o. female who returns for follow up appointment for chronic pain and medication refill. states *** pain is located in  ***. rates pain ***. current exercise regime is walking and performing stretching exercises.   Pain Inventory Average Pain 7 Pain Right Now 6 My pain is intermittent, sharp, burning, dull, stabbing, tingling, and aching  In the last 24 hours, has pain interfered with the following? General activity 6 Relation with others 7 Enjoyment of life 7 What TIME of day is your pain at its worst? morning , evening, and night Sleep (in general) Fair  Pain is worse with: walking, bending, sitting, standing, and some activites Pain improves with: rest, heat/ice, and medication Relief from Meds: 6  Family History  Problem Relation Age of Onset   Diabetes type II Mother    Colon polyps Mother    Diverticulitis Mother    Diabetes Mother    Heart disease Mother    Osteoporosis Father    Diabetes type II Brother    Uterine cancer Other    Ovarian cancer Other    COPD Paternal Grandfather    Heart attack Maternal Grandmother    Social History   Socioeconomic History   Marital status: Married    Spouse name: Not on file   Number of children: 2   Years of education: Not on file   Highest education level: Not on file  Occupational History   Occupation: Disability    Employer: UNEMPLOYED  Tobacco Use   Smoking status: Former    Types: Cigarettes    Quit date: 06/30/2011    Years since quitting: 10.9   Smokeless tobacco: Never  Vaping Use   Vaping Use: Some days   Substances: Flavoring  Substance and Sexual Activity   Alcohol use: No    Alcohol/week: 0.0 standard drinks of alcohol   Drug use: No   Sexual activity: Yes  Other Topics Concern   Not on file  Social History Narrative   Not on file   Social Determinants of Health    Financial Resource Strain: Not on file  Food Insecurity: Not on file  Transportation Needs: Not on file  Physical Activity: Not on file  Stress: Not on file  Social Connections: Not on file   Past Surgical History:  Procedure Laterality Date   CESAREAN SECTION     Left elbow surgery     PARTIAL HYSTERECTOMY     TONSILLECTOMY     Past Surgical History:  Procedure Laterality Date   CESAREAN SECTION     Left elbow surgery     PARTIAL HYSTERECTOMY     TONSILLECTOMY     Past Medical History:  Diagnosis Date   Anxiety    Chronic pain    Dr. Wynn Banker   Degenerative disc disease    Spinal, small syrinx   Depression    Esophageal stricture    Status post dilatation   Fibromyalgia    GERD (gastroesophageal reflux disease)    Hiatal hernia    Internal hemorrhoids without mention of complication    Irritable bowel syndrome    Palpitations    No documented arrhythmias   Personal history of colonic polyps 10/04/2000   HYPERPLASTIC POLYP   POTS (postural orthostatic tachycardia syndrome)    Syncope    Negative tilt table test  Syringomyelia and syringobulbia (HCC)    There were no vitals taken for this visit.  Opioid Risk Score:   Fall Risk Score:  `1  Depression screen [facility] 2/9     03/17/2022   10:44 AM 10/22/2021    3:15 PM 08/25/2021    2:55 PM 07/21/2021    2:20 PM 06/23/2021    2:51 PM 05/29/2021    1:18 PM 04/30/2021   11:37 AM  Depression screen PHQ 2/9  Decreased Interest 1 0 0 0 3 2 1   Down, Depressed, Hopeless 1 0 0 0 3 2 1   PHQ - 2 Score 2 0 0 0 6 4 2   Altered sleeping     1    Tired, decreased energy     3    Change in appetite     3    Feeling bad or failure about yourself      1    Trouble concentrating     3    Moving slowly or fidgety/restless     1    Suicidal thoughts     0    PHQ-9 Score     18        Review of Systems  Musculoskeletal:  Positive for back pain.       B/L shoulder,hand,foot pain Right knee pain   All other systems  reviewed and are negative.     Objective:   Physical Exam        Assessment & Plan:  1. Lumbar degenerative disc disease: 05/08/2022. Continue with current treatment regimen: Refilled : HYDROcodone 7.5/325 mg two tablets every 8 hours as needed #180. We will continue the opioid monitoring program, this consists of regular clinic visits, examinations, urine drug screen, pill counts as well as use of Controlled Substance Reporting system. A 12 month History has been reviewed on the 07/08/2022 Controlled Substance Reporting System on 05/08/2022  2. Thoracic Spondylosis: Continue current treatment with HEP and  current medication regime. 05/08/2022. 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 Continue to Monitor. 05/08/2022.  Lyrica discontinued due to adverse effects. Continue with exercise and heat therapy. 05/08/2022 4. Fibromyalgia:Continue with Heat and  exercise Regimen. Continue Gabapentin. 05/08/2022  5.Depression and anxiety: PCP Following. 05/08/2022 6. Orthostatic hypotension: Cardiology Following. 05/08/2022 7. Polyarthralgia: Continue to monitor. 10/062023 8. Chronic Bilateral  Shoulder Pain: No complaints today. Continue current medication regime. Continue HEP as Tolerated. 10/06//2023. 9. Muscle Spasm:No complaints today.  Continue to monitor.  05/08/2022. 10. Neuropathic Pain: Continue Gabapentin. Continue to Monitor.  05/08/2022  11. Right elbow Pain: No complaints today. Continue current medication regimen. Continue to monitor. 05/08/2022 12. Right  Knee with OA:.Continue HEP as Tolerated. Continue current medication regimen. Continue to monitor. 05/08/2022  13. Right Hip Pain: No complaints today. Continue with HEP as Tolerated. Continue to Monitor. 05/08/2022   F/U in 1 month

## 2022-06-20 ENCOUNTER — Encounter: Payer: Self-pay | Admitting: Registered Nurse

## 2022-06-25 ENCOUNTER — Other Ambulatory Visit: Payer: Self-pay | Admitting: Neurology

## 2022-06-29 NOTE — Telephone Encounter (Signed)
Needs appointment for further refills

## 2022-07-22 ENCOUNTER — Encounter: Payer: Medicare Other | Attending: Physical Medicine & Rehabilitation | Admitting: Registered Nurse

## 2022-07-22 ENCOUNTER — Encounter: Payer: Self-pay | Admitting: Registered Nurse

## 2022-07-22 VITALS — BP 117/84 | HR 99 | Ht 67.0 in

## 2022-07-22 DIAGNOSIS — M546 Pain in thoracic spine: Secondary | ICD-10-CM | POA: Diagnosis not present

## 2022-07-22 DIAGNOSIS — G894 Chronic pain syndrome: Secondary | ICD-10-CM | POA: Diagnosis not present

## 2022-07-22 DIAGNOSIS — M47812 Spondylosis without myelopathy or radiculopathy, cervical region: Secondary | ICD-10-CM | POA: Diagnosis not present

## 2022-07-22 DIAGNOSIS — M25511 Pain in right shoulder: Secondary | ICD-10-CM | POA: Insufficient documentation

## 2022-07-22 DIAGNOSIS — Z5181 Encounter for therapeutic drug level monitoring: Secondary | ICD-10-CM

## 2022-07-22 DIAGNOSIS — M2550 Pain in unspecified joint: Secondary | ICD-10-CM | POA: Diagnosis not present

## 2022-07-22 DIAGNOSIS — M545 Low back pain, unspecified: Secondary | ICD-10-CM | POA: Diagnosis not present

## 2022-07-22 DIAGNOSIS — M542 Cervicalgia: Secondary | ICD-10-CM | POA: Diagnosis not present

## 2022-07-22 DIAGNOSIS — G8929 Other chronic pain: Secondary | ICD-10-CM

## 2022-07-22 DIAGNOSIS — M797 Fibromyalgia: Secondary | ICD-10-CM

## 2022-07-22 DIAGNOSIS — M25512 Pain in left shoulder: Secondary | ICD-10-CM | POA: Insufficient documentation

## 2022-07-22 DIAGNOSIS — M4716 Other spondylosis with myelopathy, lumbar region: Secondary | ICD-10-CM | POA: Diagnosis not present

## 2022-07-22 DIAGNOSIS — M5412 Radiculopathy, cervical region: Secondary | ICD-10-CM

## 2022-07-22 MED ORDER — HYDROCODONE-ACETAMINOPHEN 7.5-325 MG PO TABS: 2.0000 | ORAL_TABLET | Freq: Three times a day (TID) | ORAL | 0 refills | Status: DC | PRN

## 2022-07-22 NOTE — Progress Notes (Deleted)
   Subjective:    Patient ID: [name], female    DOB: 1975-08-18, 46 y.o.   MRN: [id number]  HPI   .cpr Review of Systems     Objective:   Physical Exam        Assessment & Plan:

## 2022-07-22 NOTE — Progress Notes (Signed)
Subjective:    Patient ID: [name], female    DOB: Dec 19, 1975, 46 y.o.   MRN: [id number]  HPI: [name] is a 46 y.o. female who returns for follow up appointment for chronic pain and medication refill. She states her pain is located in her neck, lower back and right knee pain. Also reports she has a headache today.. She rates her pain 6. Her current exercise regime is walking and performing stretching exercises.  Ms. Chowning Morphine equivalent is 45.00 MME.  She is also prescribed Clonazepam  by Dr. [name] .We have discussed the black box warning of using opioids and benzodiazepines. I highlighted the dangers of using these drugs together and discussed the adverse events including respiratory suppression, overdose, cognitive impairment and importance of compliance with current regimen. We will continue to monitor and adjust as indicated.    Last UDS was Performed on 05/08/2022, it was consistent.   Pain Inventory Average Pain 7 Pain Right Now 6 My pain is intermittent, sharp, dull, and tingling  In the last 24 hours, has pain interfered with the following? General activity 8 Relation with others 7 Enjoyment of life 7 What TIME of day is your pain at its worst? morning , evening, and night Sleep (in general) Fair  Pain is worse with: walking, bending, sitting, inactivity, standing, and some activites Pain improves with: rest, heat/ice, and medication Relief from Meds: 6  Family History  Problem Relation Age of Onset   Diabetes type II Mother    Colon polyps Mother    Diverticulitis Mother    Diabetes Mother    Heart disease Mother    Osteoporosis Father    Diabetes type II Brother    Uterine cancer Other    Ovarian cancer Other    COPD Paternal Grandfather    Heart attack Maternal Grandmother    Social History   Socioeconomic History   Marital status: Married    Spouse name: Not on file   Number of children: 2   Years of education: Not on file   Highest  education level: Not on file  Occupational History   Occupation: Disability    Employer: UNEMPLOYED  Tobacco Use   Smoking status: Former    Types: Cigarettes    Quit date: 06/30/2011    Years since quitting: 11.0   Smokeless tobacco: Never  Vaping Use   Vaping Use: Some days   Substances: Flavoring  Substance and Sexual Activity   Alcohol use: No    Alcohol/week: 0.0 standard drinks of alcohol   Drug use: No   Sexual activity: Yes  Other Topics Concern   Not on file  Social History Narrative   Not on file   Social Determinants of Health   Financial Resource Strain: Not on file  Food Insecurity: Not on file  Transportation Needs: Not on file  Physical Activity: Not on file  Stress: Not on file  Social Connections: Not on file   Past Surgical History:  Procedure Laterality Date   CESAREAN SECTION     Left elbow surgery     PARTIAL HYSTERECTOMY     TONSILLECTOMY     Past Surgical History:  Procedure Laterality Date   CESAREAN SECTION     Left elbow surgery     PARTIAL HYSTERECTOMY     TONSILLECTOMY     Past Medical History:  Diagnosis Date   Anxiety    Chronic pain    Dr. Wynn Banker   Degenerative disc  disease    Spinal, small syrinx   Depression    Esophageal stricture    Status post dilatation   Fibromyalgia    GERD (gastroesophageal reflux disease)    Hiatal hernia    Internal hemorrhoids without mention of complication    Irritable bowel syndrome    Palpitations    No documented arrhythmias   Personal history of colonic polyps 10/04/2000   HYPERPLASTIC POLYP   POTS (postural orthostatic tachycardia syndrome)    Syncope    Negative tilt table test   Syringomyelia and syringobulbia (HCC)    There were no vitals taken for this visit.  Opioid Risk Score:   Fall Risk Score:  `1  Depression screen PHQ 2/9     06/19/2022    2:54 PM 03/17/2022   10:44 AM 10/22/2021    3:15 PM 08/25/2021    2:55 PM 07/21/2021    2:20 PM 06/23/2021    2:51 PM  05/29/2021    1:18 PM  Depression screen PHQ 2/9  Decreased Interest 0 1 0 0 0 3 2  Down, Depressed, Hopeless 0 1 0 0 0 3 2  PHQ - 2 Score 0 2 0 0 0 6 4  Altered sleeping      1   Tired, decreased energy      3   Change in appetite      3   Feeling bad or failure about yourself       1   Trouble concentrating      3   Moving slowly or fidgety/restless      1   Suicidal thoughts      0   PHQ-9 Score      18     Review of Systems  Musculoskeletal:  Positive for arthralgias, back pain and neck pain.  Neurological:  Positive for headaches.  All other systems reviewed and are negative.      Objective:   Physical Exam Vitals and nursing note reviewed.  Constitutional:      Appearance: Normal appearance.  Neck:     Comments: Cervical Paraspinal Tenderness: C-5-C-6 Cardiovascular:     Rate and Rhythm: Normal rate and regular rhythm.     Pulses: Normal pulses.     Heart sounds: Normal heart sounds.  Pulmonary:     Effort: Pulmonary effort is normal.     Breath sounds: Normal breath sounds.  Musculoskeletal:     Cervical back: Normal range of motion and neck supple.     Comments: Normal Muscle Bulk and Muscle Testing Reveals:  Upper Extremities: Full ROM and Muscle Strength 5/5 Bilateral AC Joint Tenderness Thoracic Paraspinal Tenderness: T-3-T-4 Lower Extremities: Full ROM and Muscle Strength 5/5 Arises from Table with Ease Narrow Based  Gait     Skin:    General: Skin is warm and dry.  Neurological:     Mental Status: She is alert and oriented to person, place, and time.  Psychiatric:        Mood and Affect: Mood normal.        Behavior: Behavior normal.         Assessment & Plan:  1. Lumbar degenerative disc disease: 07/22/2022. Continue with current treatment regimen: Refilled : HYDROcodone 7.5/325 mg two tablets every 8 hours as needed #180. We will continue the opioid monitoring program, this consists of regular clinic visits, examinations, urine drug screen,  pill counts as well as use of [place] Controlled Substance Reporting system. A 12 month  History has been reviewed on the [place] Controlled Substance Reporting System on 07/22/2022  2. Thoracic Spondylosis: Continue current treatment with HEP and  current medication regime. 07/22/2022. 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 Continue to Monitor. 07/22/2022.  Lyrica discontinued due to adverse effects. Continue with exercise and heat therapy. 07/22/2022 4. Fibromyalgia:Continue with Heat and  exercise Regimen. Continue Gabapentin. 07/22/2022  5.Depression and anxiety: PCP Following. 07/22/2022 6. Orthostatic hypotension: Cardiology Following. 07/22/2022 7. Polyarthralgia: Continue to monitor. 12/202023 8. Chronic Bilateral  Shoulder Pain: No complaints today. Continue current medication regime. Continue HEP as Tolerated. 12/20//2023. 9. Muscle Spasm:No complaints today.  Continue to monitor.  07/22/2022. 10. Neuropathic Pain: Continue Gabapentin. Continue to Monitor.  07/22/2022  11. Right elbow Pain: No complaints today. Continue current medication regimen. Continue to monitor. 07/22/2022 12. Right  Knee with OA:.Continue HEP as Tolerated. Continue current medication regimen. Continue to monitor. 07/22/2022  13. Right Hip Pain: No complaints today. Continue with HEP as Tolerated. Continue to Monitor. 07/22/2022   F/U in 1 month

## 2022-07-29 ENCOUNTER — Encounter: Payer: Medicare Other | Admitting: Registered Nurse

## 2022-08-17 ENCOUNTER — Other Ambulatory Visit: Payer: Self-pay | Admitting: Neurology

## 2022-08-27 NOTE — Progress Notes (Deleted)
Subjective:    Patient ID: [name], female    DOB: [birth year], 47 y.o.   MRN: [id number]  HPI   Pain Inventory Average Pain {NUMBERS; 0-10:5044} Pain Right Now {NUMBERS; 0-10:5044} My pain is {PAIN DESCRIPTION:21022940}  In the last 24 hours, has pain interfered with the following? General activity {NUMBERS; 0-10:5044} Relation with others {NUMBERS; 0-10:5044} Enjoyment of life {NUMBERS; 0-10:5044} What TIME of day is your pain at its worst? {time of day:24191} Sleep (in general) {BHH GOOD/FAIR/POOR:22877}  Pain is worse with: {ACTIVITIES:21022942} Pain improves with: {PAIN IMPROVES [id number] Relief from Meds: {NUMBERS; 0-10:5044}  Family History  Problem Relation Age of Onset   Diabetes type II Mother    Colon polyps Mother    Diverticulitis Mother    Diabetes Mother    Heart disease Mother    Osteoporosis Father    Diabetes type II Brother    Uterine cancer Other    Ovarian cancer Other    COPD Paternal Grandfather    Heart attack Maternal Grandmother    Social History   Socioeconomic History   Marital status: Married    Spouse name: Not on file   Number of children: 2   Years of education: Not on file   Highest education level: Not on file  Occupational History   Occupation: Disability    Employer: UNEMPLOYED  Tobacco Use   Smoking status: Former    Types: Cigarettes    Quit date: 06/30/2011    Years since quitting: 11.1   Smokeless tobacco: Never  Vaping Use   Vaping Use: Some days   Substances: Flavoring  Substance and Sexual Activity   Alcohol use: No    Alcohol/week: 0.0 standard drinks of alcohol   Drug use: No   Sexual activity: Yes  Other Topics Concern   Not on file  Social History Narrative   Not on file   Social Determinants of Health   Financial Resource Strain: Not on file  Food Insecurity: Not on file  Transportation Needs: Not on file  Physical Activity: Not on file  Stress: Not on file  Social Connections: Not  on file   Past Surgical History:  Procedure Laterality Date   CESAREAN SECTION     Left elbow surgery     PARTIAL HYSTERECTOMY     TONSILLECTOMY     Past Surgical History:  Procedure Laterality Date   CESAREAN SECTION     Left elbow surgery     PARTIAL HYSTERECTOMY     TONSILLECTOMY     Past Medical History:  Diagnosis Date   Anxiety    Chronic pain    Dr. Letta Pate   Degenerative disc disease    Spinal, small syrinx   Depression    Esophageal stricture    Status post dilatation   Fibromyalgia    GERD (gastroesophageal reflux disease)    Hiatal hernia    Internal hemorrhoids without mention of complication    Irritable bowel syndrome    Palpitations    No documented arrhythmias   Personal history of colonic polyps 10/04/2000   HYPERPLASTIC POLYP   POTS (postural orthostatic tachycardia syndrome)    Syncope    Negative tilt table test   Syringomyelia and syringobulbia ([place])    There were no vitals taken for this visit.  Opioid Risk Score:   Fall Risk Score:  `1  Depression screen [facility] 2/9     07/22/2022    1:55 PM 06/19/2022    2:54 PM 03/17/2022  10:44 AM 10/22/2021    3:15 PM 08/25/2021    2:55 PM 07/21/2021    2:20 PM 06/23/2021    2:51 PM  Depression screen PHQ 2/9  Decreased Interest 1 0 1 0 0 0 3  Down, Depressed, Hopeless 1 0 1 0 0 0 3  PHQ - 2 Score 2 0 2 0 0 0 6  Altered sleeping       1  Tired, decreased energy       3  Change in appetite       3  Feeling bad or failure about yourself        1  Trouble concentrating       3  Moving slowly or fidgety/restless       1  Suicidal thoughts       0  PHQ-9 Score       18    Review of Systems     Objective:   Physical Exam        Assessment & Plan:

## 2022-08-28 ENCOUNTER — Telehealth: Payer: Self-pay | Admitting: Registered Nurse

## 2022-08-28 ENCOUNTER — Encounter: Payer: Medicare Other | Admitting: Registered Nurse

## 2022-08-28 MED ORDER — HYDROCODONE-ACETAMINOPHEN 7.5-325 MG PO TABS
2.0000 | ORAL_TABLET | Freq: Three times a day (TID) | ORAL | 0 refills | Status: DC | PRN
Start: 2022-08-28 — End: 2022-09-25

## 2022-08-28 NOTE — Telephone Encounter (Signed)
PMP was Reviewed.  °Hydrocodone e-scribed today.  °[name] is aware via My-Chart message.  °

## 2022-08-28 NOTE — Telephone Encounter (Signed)
Patient is unable to come today due to death in family. Resch appt. Please refill meds.

## 2022-09-14 ENCOUNTER — Encounter: Payer: Medicare Other | Admitting: Registered Nurse

## 2022-09-14 ENCOUNTER — Other Ambulatory Visit: Payer: Self-pay | Admitting: Neurology

## 2022-09-14 NOTE — Progress Notes (Deleted)
Subjective:    Patient ID: [name], female    DOB: [birth year], 48 y.o.   MRN: [id number]  HPI   Pain Inventory Average Pain {NUMBERS; 0-10:5044} Pain Right Now {NUMBERS; 0-10:5044} My pain is {PAIN DESCRIPTION:21022940}  In the last 24 hours, has pain interfered with the following? General activity {NUMBERS; 0-10:5044} Relation with others {NUMBERS; 0-10:5044} Enjoyment of life {NUMBERS; 0-10:5044} What TIME of day is your pain at its worst? {time of day:24191} Sleep (in general) {BHH GOOD/FAIR/POOR:22877}  Pain is worse with: {ACTIVITIES:21022942} Pain improves with: {PAIN IMPROVES SV:[id number] Relief from Meds: {NUMBERS; 0-10:5044}  Family History  Problem Relation Age of Onset   Diabetes type II Mother    Colon polyps Mother    Diverticulitis Mother    Diabetes Mother    Heart disease Mother    Osteoporosis Father    Diabetes type II Brother    Uterine cancer Other    Ovarian cancer Other    COPD Paternal Grandfather    Heart attack Maternal Grandmother    Social History   Socioeconomic History   Marital status: Married    Spouse name: Not on file   Number of children: 2   Years of education: Not on file   Highest education level: Not on file  Occupational History   Occupation: Disability    Employer: UNEMPLOYED  Tobacco Use   Smoking status: Former    Types: Cigarettes    Quit date: 06/30/2011    Years since quitting: 11.2   Smokeless tobacco: Never  Vaping Use   Vaping Use: Some days   Substances: Flavoring  Substance and Sexual Activity   Alcohol use: No    Alcohol/week: 0.0 standard drinks of alcohol   Drug use: No   Sexual activity: Yes  Other Topics Concern   Not on file  Social History Narrative   Not on file   Social Determinants of Health   Financial Resource Strain: Not on file  Food Insecurity: Not on file  Transportation Needs: Not on file  Physical Activity: Not on file  Stress: Not on file  Social Connections: Not  on file   Past Surgical History:  Procedure Laterality Date   CESAREAN SECTION     Left elbow surgery     PARTIAL HYSTERECTOMY     TONSILLECTOMY     Past Surgical History:  Procedure Laterality Date   CESAREAN SECTION     Left elbow surgery     PARTIAL HYSTERECTOMY     TONSILLECTOMY     Past Medical History:  Diagnosis Date   Anxiety    Chronic pain    Dr. Letta Pate   Degenerative disc disease    Spinal, small syrinx   Depression    Esophageal stricture    Status post dilatation   Fibromyalgia    GERD (gastroesophageal reflux disease)    Hiatal hernia    Internal hemorrhoids without mention of complication    Irritable bowel syndrome    Palpitations    No documented arrhythmias   Personal history of colonic polyps 10/04/2000   HYPERPLASTIC POLYP   POTS (postural orthostatic tachycardia syndrome)    Syncope    Negative tilt table test   Syringomyelia and syringobulbia ([place])    There were no vitals taken for this visit.  Opioid Risk Score:   Fall Risk Score:  `1  Depression screen [facility] 2/9     07/22/2022    1:55 PM 06/19/2022    2:54 PM 03/17/2022  10:44 AM 10/22/2021    3:15 PM 08/25/2021    2:55 PM 07/21/2021    2:20 PM 06/23/2021    2:51 PM  Depression screen PHQ 2/9  Decreased Interest 1 0 1 0 0 0 3  Down, Depressed, Hopeless 1 0 1 0 0 0 3  PHQ - 2 Score 2 0 2 0 0 0 6  Altered sleeping       1  Tired, decreased energy       3  Change in appetite       3  Feeling bad or failure about yourself        1  Trouble concentrating       3  Moving slowly or fidgety/restless       1  Suicidal thoughts       0  PHQ-9 Score       18    Review of Systems     Objective:   Physical Exam        Assessment & Plan:

## 2022-09-25 ENCOUNTER — Encounter: Payer: Medicare Other | Admitting: Registered Nurse

## 2022-09-25 ENCOUNTER — Telehealth: Payer: Self-pay | Admitting: Registered Nurse

## 2022-09-25 MED ORDER — HYDROCODONE-ACETAMINOPHEN 7.5-325 MG PO TABS: 2.0000 | ORAL_TABLET | Freq: Three times a day (TID) | ORAL | 0 refills | Status: DC | PRN

## 2022-09-25 NOTE — Telephone Encounter (Signed)
Patient came in for her appointment. She didn't get the messages I had left. She couldn't come back until 09/30/22. But she will run out of her meds on 09/27/22

## 2022-09-25 NOTE — Telephone Encounter (Signed)
Can you send refill to pharmacy for Hydrocodone/APAP in absence of Eunice. She had an appt with Zella Ball today that had to be rescheduled.

## 2022-09-30 ENCOUNTER — Encounter: Payer: Self-pay | Admitting: Registered Nurse

## 2022-09-30 ENCOUNTER — Encounter: Payer: Medicare Other | Attending: Physical Medicine & Rehabilitation | Admitting: Registered Nurse

## 2022-09-30 VITALS — BP 123/83 | HR 84 | Ht 67.0 in | Wt 156.0 lb

## 2022-09-30 DIAGNOSIS — Z5181 Encounter for therapeutic drug level monitoring: Secondary | ICD-10-CM | POA: Insufficient documentation

## 2022-09-30 DIAGNOSIS — M797 Fibromyalgia: Secondary | ICD-10-CM | POA: Insufficient documentation

## 2022-09-30 DIAGNOSIS — G8929 Other chronic pain: Secondary | ICD-10-CM | POA: Diagnosis not present

## 2022-09-30 DIAGNOSIS — M545 Low back pain, unspecified: Secondary | ICD-10-CM | POA: Insufficient documentation

## 2022-09-30 DIAGNOSIS — M2550 Pain in unspecified joint: Secondary | ICD-10-CM | POA: Insufficient documentation

## 2022-09-30 DIAGNOSIS — M5412 Radiculopathy, cervical region: Secondary | ICD-10-CM | POA: Diagnosis not present

## 2022-09-30 DIAGNOSIS — G894 Chronic pain syndrome: Secondary | ICD-10-CM | POA: Diagnosis not present

## 2022-09-30 DIAGNOSIS — M546 Pain in thoracic spine: Secondary | ICD-10-CM | POA: Diagnosis not present

## 2022-09-30 DIAGNOSIS — M25512 Pain in left shoulder: Secondary | ICD-10-CM | POA: Insufficient documentation

## 2022-09-30 DIAGNOSIS — M47812 Spondylosis without myelopathy or radiculopathy, cervical region: Secondary | ICD-10-CM | POA: Insufficient documentation

## 2022-09-30 DIAGNOSIS — M25511 Pain in right shoulder: Secondary | ICD-10-CM | POA: Insufficient documentation

## 2022-09-30 DIAGNOSIS — M542 Cervicalgia: Secondary | ICD-10-CM | POA: Insufficient documentation

## 2022-09-30 MED ORDER — METHYLPREDNISOLONE 4 MG PO TBPK: ORAL_TABLET | ORAL | 0 refills | Status: DC

## 2022-09-30 MED ORDER — HYDROCODONE-ACETAMINOPHEN 7.5-325 MG PO TABS: 2.0000 | ORAL_TABLET | Freq: Three times a day (TID) | ORAL | 0 refills | Status: DC | PRN

## 2022-09-30 NOTE — Progress Notes (Signed)
Subjective:    Patient ID: [name], female    DOB: 1976-01-21, 47 y.o.   MRN: [id number]  HPI: [name] is a 47 y.o. female who returns for follow up appointment for chronic pain and medication refill. She states her pain is located in her .neck radiating into her bilateral shoulders, mid- lower back and generalized joint pain. She also reports increase intensity of lower back pain, she denies falling. Medrol dose will be prescribed and we discuss the injection ( Transforaminal Injection)  Dr [name] recommended, she doesn't want an injection at this tme, she will send a My- Chat message after she thinks about it, she verbalizes understanding. She also states she has a  headache this morning, it's onset was  this morning. She rates her pain 7. Her current exercise regime is walking and performing stretching exercises.  [name] equivalent is 45.00 MME.   Last UDS was Performed on 05/08/2022, it was consistent.   She is also prescribed Clonazepam by Dr. Gerarda Shea .We have discussed the black box warning of using opioids and benzodiazepines. I highlighted the dangers of using these drugs together and discussed the adverse events including respiratory suppression, overdose, cognitive impairment and importance of compliance with current regimen. We will continue to monitor and adjust as indicated.       Pain Inventory Average Pain 6 Pain Right Now 7 My pain is intermittent, sharp, burning, dull, stabbing, and tingling  In the last 24 hours, has pain interfered with the following? General activity 7 Relation with others 7 Enjoyment of life 7 What TIME of day is your pain at its worst? morning , evening, night, and varies Sleep (in general) Fair  Pain is worse with: walking, bending, sitting, inactivity, standing, and some activites Pain improves with: rest, heat/ice, and medication Relief from Meds: 6  Family History  Problem Relation Age of Onset   Diabetes type  II Mother    Colon polyps Mother    Diverticulitis Mother    Diabetes Mother    Heart disease Mother    Osteoporosis Father    Diabetes type II Brother    Uterine cancer Other    Ovarian cancer Other    COPD Paternal Grandfather    Heart attack Maternal Grandmother    Social History   Socioeconomic History   Marital status: Married    Spouse name: Not on file   Number of children: 2   Years of education: Not on file   Highest education level: Not on file  Occupational History   Occupation: Disability    Employer: UNEMPLOYED  Tobacco Use   Smoking status: Former    Types: Cigarettes    Quit date: 06/30/2011    Years since quitting: 11.2   Smokeless tobacco: Never  Vaping Use   Vaping Use: Some days   Substances: Flavoring  Substance and Sexual Activity   Alcohol use: No    Alcohol/week: 0.0 standard drinks of alcohol   Drug use: No   Sexual activity: Yes  Other Topics Concern   Not on file  Social History Narrative   Not on file   Social Determinants of Health   Financial Resource Strain: Not on file  Food Insecurity: Not on file  Transportation Needs: Not on file  Physical Activity: Not on file  Stress: Not on file  Social Connections: Not on file   Past Surgical History:  Procedure Laterality Date   CESAREAN SECTION     Left  elbow surgery     PARTIAL HYSTERECTOMY     TONSILLECTOMY     Past Surgical History:  Procedure Laterality Date   CESAREAN SECTION     Left elbow surgery     PARTIAL HYSTERECTOMY     TONSILLECTOMY     Past Medical History:  Diagnosis Date   Anxiety    Chronic pain    Dr. Letta Shea   Degenerative disc disease    Spinal, small syrinx   Depression    Esophageal stricture    Status post dilatation   Fibromyalgia    GERD (gastroesophageal reflux disease)    Hiatal hernia    Internal hemorrhoids without mention of complication    Irritable bowel syndrome    Palpitations    No documented arrhythmias   Personal history of  colonic polyps 10/04/2000   HYPERPLASTIC POLYP   POTS (postural orthostatic tachycardia syndrome)    Syncope    Negative tilt table test   Syringomyelia and syringobulbia (HCC)    BP 123/83   Pulse 84   Ht '5\' 7"'$  (1.702 m)   Wt 156 lb (70.8 kg)   SpO2 100%   BMI 24.43 kg/m   Opioid Risk Score:   Fall Risk Score:  `1  Depression screen PHQ 2/9     07/22/2022    1:55 PM 06/19/2022    2:54 PM 03/17/2022   10:44 AM 10/22/2021    3:15 PM 08/25/2021    2:55 PM 07/21/2021    2:20 PM 06/23/2021    2:51 PM  Depression screen PHQ 2/9  Decreased Interest 1 0 1 0 0 0 3  Down, Depressed, Hopeless 1 0 1 0 0 0 3  PHQ - 2 Score 2 0 2 0 0 0 6  Altered sleeping       1  Tired, decreased energy       3  Change in appetite       3  Feeling bad or failure about yourself        1  Trouble concentrating       3  Moving slowly or fidgety/restless       1  Suicidal thoughts       0  PHQ-9 Score       18    Review of Systems  Musculoskeletal:  Positive for back pain and neck pain. Negative for gait problem.       Right arm pain, right hip pain, pain in both feet, right hand pain  Neurological:  Positive for headaches.  All other systems reviewed and are negative.      Objective:   Physical Exam Vitals and nursing note reviewed.  Constitutional:      Appearance: Normal appearance.  Cardiovascular:     Rate and Rhythm: Normal rate and regular rhythm.     Pulses: Normal pulses.     Heart sounds: Normal heart sounds.  Pulmonary:     Effort: Pulmonary effort is normal.     Breath sounds: Normal breath sounds.  Musculoskeletal:     Cervical back: Normal range of motion and neck supple.     Comments: Normal Muscle Bulk and Muscle Testing Reveals:  Upper Extremities: Full ROM and Muscle Strength 5/5  Thoracic Paraspinal Tenderness: T-7-T-9 Lumbar Paraspinal Tenderness: L-3- L-5 Lower Extremities: Full ROM and Muscle Strength 5/5 Arises from Table slowly Antalgic  Gait     Skin:     General: Skin is warm and dry.  Neurological:  Mental Status: She is alert and oriented to person, place, and time.  Psychiatric:        Mood and Affect: Mood normal.        Behavior: Behavior normal.         Assessment & Plan:  Acute Exacerbation of Chronic Low Back Pain: RX: Medrol Dose Pak. Continue HEP as Tolerated. Continue to Monitor.   Lumbar degenerative disc disease: Continue with current treatment regimen: Refilled : HYDROcodone 7.5/325 mg two tablets every 8 hours as needed #180. We will continue the opioid monitoring program, this consists of regular clinic visits, examinations, urine drug screen, pill counts as well as use of [place] Controlled Substance Reporting system. A 12 month History has been reviewed on the [place] Controlled Substance Reporting System on 09/30/2022  3. Thoracic Spondylosis: Continue current treatment with HEP and  current medication regime. 09/30/2022. 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 Continue to Monitor. 09/30/2022.  Lyrica discontinued due to adverse effects. Continue with exercise and heat therapy. 09/30/2022 5. Fibromyalgia:Continue with Heat and  exercise Regimen. Continue Gabapentin. 09/30/2022  6.Depression and anxiety: PCP Following. 09/30/2022 7. Orthostatic hypotension: Cardiology Following. 09/30/2022 8. Polyarthralgia: Continue to monitor. 02/282024 9. Chronic Bilateral  Shoulder Pain: Continue current medication regime. Continue HEP as Tolerated. 02/28//2024. 10. Muscle Spasm:No complaints today.  Continue to monitor.  09/30/2022. 11. Neuropathic Pain: Continue Gabapentin. Continue to Monitor.  09/30/2022  12. Right elbow Pain: No complaints today. Continue current medication regimen. Continue to monitor. 09/30/2022 13. Right  Knee with OA:.Continue HEP as Tolerated. Continue current medication regimen. Continue to monitor. 09/30/2022  14. Right  Hip Pain: No complaints today. Continue with HEP as Tolerated. Continue to Monitor. 09/30/2022   F/U in 1 month

## 2022-10-05 DIAGNOSIS — I10 Essential (primary) hypertension: Secondary | ICD-10-CM | POA: Diagnosis not present

## 2022-10-05 DIAGNOSIS — Z0001 Encounter for general adult medical examination with abnormal findings: Secondary | ICD-10-CM | POA: Diagnosis not present

## 2022-10-05 DIAGNOSIS — E559 Vitamin D deficiency, unspecified: Secondary | ICD-10-CM | POA: Diagnosis not present

## 2022-10-13 ENCOUNTER — Other Ambulatory Visit: Payer: Self-pay | Admitting: Neurology

## 2022-11-18 ENCOUNTER — Encounter: Payer: Medicare Other | Attending: Physical Medicine & Rehabilitation | Admitting: Registered Nurse

## 2022-11-18 ENCOUNTER — Encounter: Payer: Self-pay | Admitting: Registered Nurse

## 2022-11-18 VITALS — BP 115/85 | HR 96 | Ht 67.0 in | Wt 156.0 lb

## 2022-11-18 DIAGNOSIS — G894 Chronic pain syndrome: Secondary | ICD-10-CM | POA: Insufficient documentation

## 2022-11-18 DIAGNOSIS — M47814 Spondylosis without myelopathy or radiculopathy, thoracic region: Secondary | ICD-10-CM | POA: Diagnosis not present

## 2022-11-18 DIAGNOSIS — M5412 Radiculopathy, cervical region: Secondary | ICD-10-CM | POA: Insufficient documentation

## 2022-11-18 DIAGNOSIS — M2550 Pain in unspecified joint: Secondary | ICD-10-CM | POA: Insufficient documentation

## 2022-11-18 DIAGNOSIS — M545 Low back pain, unspecified: Secondary | ICD-10-CM | POA: Insufficient documentation

## 2022-11-18 DIAGNOSIS — F419 Anxiety disorder, unspecified: Secondary | ICD-10-CM | POA: Insufficient documentation

## 2022-11-18 DIAGNOSIS — M797 Fibromyalgia: Secondary | ICD-10-CM | POA: Insufficient documentation

## 2022-11-18 DIAGNOSIS — M25511 Pain in right shoulder: Secondary | ICD-10-CM | POA: Insufficient documentation

## 2022-11-18 DIAGNOSIS — M542 Cervicalgia: Secondary | ICD-10-CM | POA: Insufficient documentation

## 2022-11-18 DIAGNOSIS — F32A Depression, unspecified: Secondary | ICD-10-CM | POA: Diagnosis not present

## 2022-11-18 DIAGNOSIS — Y92009 Unspecified place in unspecified non-institutional (private) residence as the place of occurrence of the external cause: Secondary | ICD-10-CM | POA: Insufficient documentation

## 2022-11-18 DIAGNOSIS — W19XXXD Unspecified fall, subsequent encounter: Secondary | ICD-10-CM | POA: Insufficient documentation

## 2022-11-18 DIAGNOSIS — Z5181 Encounter for therapeutic drug level monitoring: Secondary | ICD-10-CM | POA: Diagnosis not present

## 2022-11-18 DIAGNOSIS — M25512 Pain in left shoulder: Secondary | ICD-10-CM | POA: Diagnosis not present

## 2022-11-18 DIAGNOSIS — M546 Pain in thoracic spine: Secondary | ICD-10-CM | POA: Diagnosis not present

## 2022-11-18 DIAGNOSIS — I951 Orthostatic hypotension: Secondary | ICD-10-CM | POA: Diagnosis not present

## 2022-11-18 DIAGNOSIS — Z79891 Long term (current) use of opiate analgesic: Secondary | ICD-10-CM | POA: Diagnosis not present

## 2022-11-18 DIAGNOSIS — M47812 Spondylosis without myelopathy or radiculopathy, cervical region: Secondary | ICD-10-CM | POA: Insufficient documentation

## 2022-11-18 DIAGNOSIS — M62838 Other muscle spasm: Secondary | ICD-10-CM | POA: Insufficient documentation

## 2022-11-18 DIAGNOSIS — M25521 Pain in right elbow: Secondary | ICD-10-CM | POA: Diagnosis not present

## 2022-11-18 DIAGNOSIS — G8929 Other chronic pain: Secondary | ICD-10-CM

## 2022-11-18 MED ORDER — HYDROCODONE-ACETAMINOPHEN 7.5-325 MG PO TABS: 2.0000 | ORAL_TABLET | Freq: Three times a day (TID) | ORAL | 0 refills | Status: DC | PRN

## 2022-11-18 NOTE — Progress Notes (Signed)
Subjective:    Patient ID: [name], female    DOB: 12-01-1975, 47 y.o.   MRN: [id number]  HPI: [name] is a 48 y.o. female who returns for follow up appointment for chronic pain and medication refill. She states she has a headache today, awaken with a headache, also reports it's easing off. She also reports her pain is located in her neck and upper- lower back pain. She rates her pain 7. Her current exercise regime is walking and performing stretching exercises.  Ms. Mcdanel reports she was at family house on 11/02/2022, she was walking down the stairs holding  her granddaughter hand, when they reached the last step she lost her footing and fell backwards. She was able to pick herself up, she didn't seek medical attention. Educate of fall prevention, she verbalizes understanding.   Ms. Herringshaw very tearful today, she states she's having family ( marital) issues, she denies any domestic abuse, we discussed counseling. She will speak with her PCP and call her insurance company. She verbalizes understanding.  Ms. Cohill denies suicidal ideation or plan, emotional support given.   Ms. Gilroy Morphine equivalent is 45.00 MME. She  is also prescribed Clonazepam  by Dr. [name] .We have discussed the black box warning of using opioids and benzodiazepines. I highlighted the dangers of using these drugs together and discussed the adverse events including respiratory suppression, overdose, cognitive impairment and importance of compliance with current regimen. We will continue to monitor and adjust as indicated.   UDS ordered today     Pain Inventory Average Pain 7 Pain Right Now 7 My pain is constant, sharp, burning, dull, stabbing, tingling, and aching  In the last 24 hours, has pain interfered with the following? General activity 8 Relation with others 8 Enjoyment of life 8 What TIME of day is your pain at its worst? morning , daytime, evening, night, and varies Sleep (in general)  Fair  Pain is worse with: walking, bending, sitting, standing, and some activites Pain improves with: rest, heat/ice, medication, and TENS Relief from Meds: 6  Family History  Problem Relation Age of Onset   Diabetes type II Mother    Colon polyps Mother    Diverticulitis Mother    Diabetes Mother    Heart disease Mother    Osteoporosis Father    Diabetes type II Brother    Uterine cancer Other    Ovarian cancer Other    COPD Paternal Grandfather    Heart attack Maternal Grandmother    Social History   Socioeconomic History   Marital status: Married    Spouse name: Not on file   Number of children: 2   Years of education: Not on file   Highest education level: Not on file  Occupational History   Occupation: Disability    Employer: UNEMPLOYED  Tobacco Use   Smoking status: Former    Types: Cigarettes    Quit date: 06/30/2011    Years since quitting: 11.3   Smokeless tobacco: Never  Vaping Use   Vaping Use: Some days   Substances: Flavoring  Substance and Sexual Activity   Alcohol use: No    Alcohol/week: 0.0 standard drinks of alcohol   Drug use: No   Sexual activity: Yes  Other Topics Concern   Not on file  Social History Narrative   Not on file   Social Determinants of Health   Financial Resource Strain: Not on file  Food Insecurity: Not on file  Transportation  Needs: Not on file  Physical Activity: Not on file  Stress: Not on file  Social Connections: Not on file   Past Surgical History:  Procedure Laterality Date   CESAREAN SECTION     Left elbow surgery     PARTIAL HYSTERECTOMY     TONSILLECTOMY     Past Surgical History:  Procedure Laterality Date   CESAREAN SECTION     Left elbow surgery     PARTIAL HYSTERECTOMY     TONSILLECTOMY     Past Medical History:  Diagnosis Date   Anxiety    Chronic pain    Dr. Wynn Banker   Degenerative disc disease    Spinal, small syrinx   Depression    Esophageal stricture    Status post dilatation    Fibromyalgia    GERD (gastroesophageal reflux disease)    Hiatal hernia    Internal hemorrhoids without mention of complication    Irritable bowel syndrome    Palpitations    No documented arrhythmias   Personal history of colonic polyps 10/04/2000   HYPERPLASTIC POLYP   POTS (postural orthostatic tachycardia syndrome)    Syncope    Negative tilt table test   Syringomyelia and syringobulbia (HCC)    There were no vitals taken for this visit.  Opioid Risk Score:   Fall Risk Score:  `1  Depression screen [facility] 2/9     09/30/2022    2:36 PM 07/22/2022    1:55 PM 06/19/2022    2:54 PM 03/17/2022   10:44 AM 10/22/2021    3:15 PM 08/25/2021    2:55 PM 07/21/2021    2:20 PM  Depression screen PHQ 2/9  Decreased Interest 1 1 0 1 0 0 0  Down, Depressed, Hopeless 1 1 0 1 0 0 0  PHQ - 2 Score 2 2 0 2 0 0 0    Review of Systems  Musculoskeletal:  Positive for arthralgias, back pain and neck pain.       Pain between shoulder, pain the joints, pain in knees  Neurological:  Positive for headaches.       Objective:   Physical Exam Vitals and nursing note reviewed.  Constitutional:      Appearance: Normal appearance.  Neck:     Comments: Cervical Paraspinal Tenderness: C-5-C-6 Cardiovascular:     Rate and Rhythm: Normal rate and regular rhythm.     Pulses: Normal pulses.     Heart sounds: Normal heart sounds.  Pulmonary:     Effort: Pulmonary effort is normal.     Breath sounds: Normal breath sounds.  Musculoskeletal:     Cervical back: Normal range of motion and neck supple.     Comments: Normal Muscle Bulk and Muscle Testing Reveals:  Upper Extremities: Full ROM and Muscle Strength 5/5  Thoracic Paraspinal Tenderness: T-7-T-9 Lumbar Paraspinal Tenderness: L-3-L-5 Lower Extremities: Full ROM and Muscle Strength 5/5 Arises from Table Slowly Narrow Based  Gait     Skin:    General: Skin is warm and dry.  Neurological:     Mental Status: She is alert and oriented to person,  place, and time.  Psychiatric:        Mood and Affect: Mood normal.        Behavior: Behavior normal.         Assessment & Plan:  1. Lumbar degenerative disc disease: 11/18/2023. Continue with current treatment regimen: Refilled : HYDROcodone 7.5/325 mg two tablets every 8 hours as needed #180. We will  continue the opioid monitoring program, this consists of regular clinic visits, examinations, urine drug screen, pill counts as well as use of [place] Controlled Substance Reporting system. A 12 month History has been reviewed on the [place] Controlled Substance Reporting System on 04/17/20234 2. Thoracic Spondylosis: Continue current treatment with HEP and  current medication regime. 11/18/2022. 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 Continue to Monitor. 11/18/2022.  Lyrica discontinued due to adverse effects. Continue with exercise and heat therapy. 11/18/2022 4. Fibromyalgia:Continue with Heat and  exercise Regimen. Continue Gabapentin. 11/18/2022  5.Depression and anxiety: PCP Following. 11/18/2022 6. Orthostatic hypotension: Cardiology Following. 11/18/2022 7. Polyarthralgia: Continue to monitor. 04/172024 8. Chronic Bilateral  Shoulder Pain: No complaints today. Continue current medication regime. Continue HEP as Tolerated. 04/17//2024. 9. Muscle Spasm:No complaints today.  Continue to monitor.  11/18/2022. 10. Neuropathic Pain: Continue Gabapentin. Continue to Monitor.  11/18/2022  11. Right elbow Pain: No complaints today. Continue current medication regimen. Continue to monitor. 11/18/2022 12. Right  Knee with OA:.No complaints today. Continue HEP as Tolerated. Continue current medication regimen. Continue to monitor. 11/18/2022  13. Right Hip Pain: No complaints today. Continue with HEP as Tolerated. Continue to Monitor. 11/18/2022  14. Fall at home: Educated on [employer]. She verbalizes  understanding.   F/U in 1 month

## 2022-11-24 LAB — TOXASSURE SELECT,+ANTIDEPR,UR

## 2022-12-07 ENCOUNTER — Other Ambulatory Visit (HOSPITAL_COMMUNITY): Payer: Self-pay | Admitting: Internal Medicine

## 2022-12-07 DIAGNOSIS — D518 Other vitamin B12 deficiency anemias: Secondary | ICD-10-CM | POA: Diagnosis not present

## 2022-12-07 DIAGNOSIS — E559 Vitamin D deficiency, unspecified: Secondary | ICD-10-CM | POA: Diagnosis not present

## 2022-12-07 DIAGNOSIS — Z136 Encounter for screening for cardiovascular disorders: Secondary | ICD-10-CM | POA: Diagnosis not present

## 2022-12-07 DIAGNOSIS — G9332 Myalgic encephalomyelitis/chronic fatigue syndrome: Secondary | ICD-10-CM | POA: Diagnosis not present

## 2022-12-07 DIAGNOSIS — R7309 Other abnormal glucose: Secondary | ICD-10-CM | POA: Diagnosis not present

## 2022-12-07 DIAGNOSIS — Z0001 Encounter for general adult medical examination with abnormal findings: Secondary | ICD-10-CM | POA: Diagnosis not present

## 2022-12-07 DIAGNOSIS — I10 Essential (primary) hypertension: Secondary | ICD-10-CM | POA: Diagnosis not present

## 2022-12-18 ENCOUNTER — Encounter: Payer: Self-pay | Admitting: Registered Nurse

## 2022-12-18 ENCOUNTER — Encounter: Payer: Medicare Other | Attending: Physical Medicine & Rehabilitation | Admitting: Registered Nurse

## 2022-12-18 VITALS — BP 100/69 | HR 85 | Ht 67.0 in | Wt 154.0 lb

## 2022-12-18 DIAGNOSIS — M5136 Other intervertebral disc degeneration, lumbar region: Secondary | ICD-10-CM

## 2022-12-18 DIAGNOSIS — M5412 Radiculopathy, cervical region: Secondary | ICD-10-CM | POA: Insufficient documentation

## 2022-12-18 DIAGNOSIS — M47812 Spondylosis without myelopathy or radiculopathy, cervical region: Secondary | ICD-10-CM

## 2022-12-18 DIAGNOSIS — M2550 Pain in unspecified joint: Secondary | ICD-10-CM | POA: Diagnosis not present

## 2022-12-18 DIAGNOSIS — M545 Low back pain, unspecified: Secondary | ICD-10-CM | POA: Insufficient documentation

## 2022-12-18 DIAGNOSIS — M792 Neuralgia and neuritis, unspecified: Secondary | ICD-10-CM | POA: Insufficient documentation

## 2022-12-18 DIAGNOSIS — M546 Pain in thoracic spine: Secondary | ICD-10-CM | POA: Insufficient documentation

## 2022-12-18 DIAGNOSIS — M797 Fibromyalgia: Secondary | ICD-10-CM

## 2022-12-18 DIAGNOSIS — G894 Chronic pain syndrome: Secondary | ICD-10-CM | POA: Diagnosis not present

## 2022-12-18 DIAGNOSIS — G8929 Other chronic pain: Secondary | ICD-10-CM | POA: Insufficient documentation

## 2022-12-18 DIAGNOSIS — Z79891 Long term (current) use of opiate analgesic: Secondary | ICD-10-CM | POA: Insufficient documentation

## 2022-12-18 DIAGNOSIS — M542 Cervicalgia: Secondary | ICD-10-CM | POA: Insufficient documentation

## 2022-12-18 DIAGNOSIS — Z5181 Encounter for therapeutic drug level monitoring: Secondary | ICD-10-CM | POA: Insufficient documentation

## 2022-12-18 DIAGNOSIS — M47894 Other spondylosis, thoracic region: Secondary | ICD-10-CM

## 2022-12-18 DIAGNOSIS — M25561 Pain in right knee: Secondary | ICD-10-CM | POA: Insufficient documentation

## 2022-12-18 DIAGNOSIS — G5793 Unspecified mononeuropathy of bilateral lower limbs: Secondary | ICD-10-CM | POA: Insufficient documentation

## 2022-12-18 MED ORDER — HYDROCODONE-ACETAMINOPHEN 7.5-325 MG PO TABS: 2.0000 | ORAL_TABLET | Freq: Three times a day (TID) | ORAL | 0 refills | Status: DC | PRN

## 2022-12-18 NOTE — Progress Notes (Signed)
Subjective:    Patient ID: [name], female    DOB: 1975-10-03, 47 y.o.   MRN: [id number]  HPI: [name] is a 47 y.o. female who called the office reporting she was having nausea and vomiting, asked for a Video- visit. Her visit was changed to a Video-Visit,I connected with [name] on 12/18/2022 and verified that I am speaking with the correct person using two identifiers.  Location: Patient: In Her Home  Provider: In the Office    Ms. [name] and I have  discussed the limitations of evaluation and management by telemedicine and the availability of in person appointments. The patient expressed understanding and agreed to proceed.   She states her pain is located in her neck radiating into her bilateral shoulders, lower back and occasionally right hip and right knee pain. She also reports bilateral hands and bilateral feet with burning sensation. .She rates her pain 7. current exercise regime is walking and performing stretching exercises.   Ms. [name] Morphine equivalent is 45.00 MME. She is also prescribed Clonazepam  by Dr. [name] .We have discussed the black box warning of using opioids and benzodiazepines. I highlighted the dangers of using these drugs together and discussed the adverse events including respiratory suppression, overdose, cognitive impairment and importance of compliance with current regimen. We will continue to monitor and adjust as indicated.    Pain Inventory Average Pain 7 Pain Right Now 7 My pain is intermittent, sharp, burning, dull, stabbing, tingling, and aching  In the last 24 hours, has pain interfered with the following? General activity 8 Relation with others 8 Enjoyment of life 8 What TIME of day is your pain at its worst? varies Sleep (in general) Fair  Pain is worse with: walking, bending, sitting, standing, and some activites Pain improves with: rest, heat/ice, and medication Relief from Meds: 5  Family History  Problem Relation  Age of Onset   Diabetes type II Mother    Colon polyps Mother    Diverticulitis Mother    Diabetes Mother    Heart disease Mother    Osteoporosis Father    Diabetes type II Brother    Uterine cancer Other    Ovarian cancer Other    COPD Paternal Grandfather    Heart attack Maternal Grandmother    Social History   Socioeconomic History   Marital status: Married    Spouse name: Not on file   Number of children: 2   Years of education: Not on file   Highest education level: Not on file  Occupational History   Occupation: Disability    Employer: UNEMPLOYED  Tobacco Use   Smoking status: Former    Types: Cigarettes    Quit date: 06/30/2011    Years since quitting: 11.4   Smokeless tobacco: Never  Vaping Use   Vaping Use: Some days   Substances: Flavoring  Substance and Sexual Activity   Alcohol use: No    Alcohol/week: 0.0 standard drinks of alcohol   Drug use: No   Sexual activity: Yes  Other Topics Concern   Not on file  Social History Narrative   Not on file   Social Determinants of Health   Financial Resource Strain: Not on file  Food Insecurity: Not on file  Transportation Needs: Not on file  Physical Activity: Not on file  Stress: Not on file  Social Connections: Not on file   Past Surgical History:  Procedure Laterality Date   CESAREAN SECTION  Left elbow surgery     PARTIAL HYSTERECTOMY     TONSILLECTOMY     Past Surgical History:  Procedure Laterality Date   CESAREAN SECTION     Left elbow surgery     PARTIAL HYSTERECTOMY     TONSILLECTOMY     Past Medical History:  Diagnosis Date   Anxiety    Chronic pain    Dr. Wynn Banker   Degenerative disc disease    Spinal, small syrinx   Depression    Esophageal stricture    Status post dilatation   Fibromyalgia    GERD (gastroesophageal reflux disease)    Hiatal hernia    Internal hemorrhoids without mention of complication    Irritable bowel syndrome    Palpitations    No documented  arrhythmias   Personal history of colonic polyps 10/04/2000   HYPERPLASTIC POLYP   POTS (postural orthostatic tachycardia syndrome)    Syncope    Negative tilt table test   Syringomyelia and syringobulbia (HCC)    BP 100/69   Pulse 85   Ht 5\' 7"  (1.702 m)   Wt 154 lb (69.9 kg)   BMI 24.12 kg/m   Opioid Risk Score:   Fall Risk Score:  `1  Depression screen [facility] 2/9     12/18/2022    2:53 PM 11/18/2022    3:03 PM 09/30/2022    2:36 PM 07/22/2022    1:55 PM 06/19/2022    2:54 PM 03/17/2022   10:44 AM 10/22/2021    3:15 PM  Depression screen PHQ 2/9  Decreased Interest 1 1 1 1  0 1 0  Down, Depressed, Hopeless 1 1 1 1  0 1 0  PHQ - 2 Score 2 2 2 2  0 2 0    Review of Systems  Constitutional: Negative.   HENT: Negative.    Eyes: Negative.   Respiratory: Negative.    Cardiovascular: Negative.   Gastrointestinal: Negative.   Endocrine: Negative.   Genitourinary: Negative.   Musculoskeletal:  Positive for back pain.  Skin: Negative.   Allergic/Immunologic: Negative.   Neurological: Negative.   Hematological: Negative.   Psychiatric/Behavioral: Negative.    All other systems reviewed and are negative.      Objective:   Physical Exam Vitals and nursing note reviewed.  Musculoskeletal:     Comments: No Physical Exam Performed: Video Visit         Assessment & Plan:  1. Lumbar degenerative disc disease: 12/18/2023. Continue with current treatment regimen: Refilled : HYDROcodone 7.5/325 mg two tablets every 8 hours as needed #180. We will continue the opioid monitoring program, this consists of regular clinic visits, examinations, urine drug screen, pill counts as well as use of [place] Controlled Substance Reporting system. A 12 month History has been reviewed on the [place] Controlled Substance Reporting System on 05/17/20234 2. Thoracic Spondylosis: Continue current treatment with HEP and  current medication regime. 12/18/2022. 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 Continue to Monitor. 12/18/2022.  Lyrica discontinued due to adverse effects. Continue with exercise and heat therapy. 12/18/2022 4. Fibromyalgia:Continue with Heat and  exercise Regimen. Continue Gabapentin. 12/18/2022  5.Depression and anxiety: PCP Following. 12/18/2022 6. Orthostatic hypotension: Cardiology Following. 12/18/2022 7. Polyarthralgia: Continue to monitor. 05/172024 8. Chronic Bilateral  Shoulder Pain: No complaints today. Continue current medication regime. Continue HEP as Tolerated. 05/17//2024. 9. Muscle Spasm:No complaints today.  Continue to monitor.  12/18/2022. 10. Neuropathic Pain: Continue Gabapentin. Continue to Monitor.  12/18/2022  11. Right elbow Pain: No complaints today. Continue current medication regimen. Continue to monitor. 12/18/2022 12. Right  Knee with OA: Continue HEP as Tolerated. Continue current medication regimen. Continue to monitor. 04517/2024  13. Right Hip Pain: No complaints today. Continue with HEP as Tolerated. Continue to Monitor. 11/18/2022  F/U in 1 Month  Video Visit: Established Patient Location of Patient: In Her Home Location of Provider: In the Office   I discussed the assessment and treatment plan with the patient. The patient was provided an opportunity to ask questions and all were answered. The patient agreed with the plan and demonstrated an understanding of the instructions.

## 2023-01-18 ENCOUNTER — Encounter: Payer: Self-pay | Admitting: Registered Nurse

## 2023-01-18 ENCOUNTER — Encounter: Payer: Medicare Other | Attending: Physical Medicine & Rehabilitation | Admitting: Registered Nurse

## 2023-01-18 VITALS — BP 125/85 | HR 89 | Ht 67.0 in | Wt 157.6 lb

## 2023-01-18 DIAGNOSIS — G894 Chronic pain syndrome: Secondary | ICD-10-CM | POA: Diagnosis not present

## 2023-01-18 DIAGNOSIS — M797 Fibromyalgia: Secondary | ICD-10-CM | POA: Insufficient documentation

## 2023-01-18 DIAGNOSIS — M545 Low back pain, unspecified: Secondary | ICD-10-CM | POA: Insufficient documentation

## 2023-01-18 DIAGNOSIS — Z79891 Long term (current) use of opiate analgesic: Secondary | ICD-10-CM | POA: Insufficient documentation

## 2023-01-18 DIAGNOSIS — M5412 Radiculopathy, cervical region: Secondary | ICD-10-CM | POA: Diagnosis not present

## 2023-01-18 DIAGNOSIS — M47812 Spondylosis without myelopathy or radiculopathy, cervical region: Secondary | ICD-10-CM | POA: Insufficient documentation

## 2023-01-18 DIAGNOSIS — M542 Cervicalgia: Secondary | ICD-10-CM | POA: Insufficient documentation

## 2023-01-18 DIAGNOSIS — M2550 Pain in unspecified joint: Secondary | ICD-10-CM | POA: Insufficient documentation

## 2023-01-18 DIAGNOSIS — M546 Pain in thoracic spine: Secondary | ICD-10-CM | POA: Diagnosis not present

## 2023-01-18 DIAGNOSIS — G8929 Other chronic pain: Secondary | ICD-10-CM | POA: Diagnosis not present

## 2023-01-18 DIAGNOSIS — Z5181 Encounter for therapeutic drug level monitoring: Secondary | ICD-10-CM | POA: Insufficient documentation

## 2023-01-18 DIAGNOSIS — G5793 Unspecified mononeuropathy of bilateral lower limbs: Secondary | ICD-10-CM | POA: Diagnosis not present

## 2023-01-18 DIAGNOSIS — R55 Syncope and collapse: Secondary | ICD-10-CM | POA: Diagnosis not present

## 2023-01-18 MED ORDER — HYDROCODONE-ACETAMINOPHEN 7.5-325 MG PO TABS: 2.0000 | ORAL_TABLET | Freq: Three times a day (TID) | ORAL | 0 refills | Status: DC | PRN

## 2023-01-18 NOTE — Progress Notes (Signed)
Subjective:    Patient ID: [name], female    DOB: [birth year], 47 y.o.   MRN: [id number]  HPI: [name] is a 47 y.o. female who returns for follow up appointment for chronic pain and medication refill.She states her pain is located in her neck radiating into her bilateral shoulders, mid- lower back pain and bilateral feet pain with tingling and burning. She also reports generalized joint pain. She rates her pain 5. Her current exercise regime is walking and performing stretching exercises.  [name] reports on 01/08/2023, she was walking in her home and fainted, she states her husband helped her up, she didn't seek medical attention. She reports on 01/09/2023, when she looked to the right it felt like her eyes were jumping and she was experiencing dizziness, she didn't seek medical attention. She also states she feels off balanced, and had 10- 15 of the episodes of the above since she fainted. She hasn't seek medical attention. She refuses EMS to be called, she refuses to go to [facility] for evaluation. She states she will go to [facility] Ed on her way home, she was instructed to call her PCP. This provider called her husband with [name] permission, we discussed the above and he stated " he will take her to [facility] ED to be evaluated.  She states when she fell she bumped her head, she has some occipital tenderness noted.   [name] reports in the past she has had fainting spells, the above feels different, she still refuses ED evaluation. [name] states she will go to [facility] ED for evaluation today and she will call her PCP today.   [name] Morphine equivalent is 45.00 MME. She is also prescribed Clonazepam  by Dr. [name] .We have discussed the black box warning of using opioids and benzodiazepines. I highlighted the dangers of using these drugs together and discussed the adverse events including respiratory suppression, overdose, cognitive impairment and importance  of compliance with current regimen. We will continue to monitor and adjust as indicated.   . Last UDS was Performed on 11/18/2022, it was consistent.       Pain Inventory Average Pain 7 Pain Right Now 5 My pain is intermittent, sharp, burning, dull, stabbing, tingling, and aching  In the last 24 hours, has pain interfered with the following? General activity 7 Relation with others 7 Enjoyment of life 7 What TIME of day is your pain at its worst? morning , evening, and night Sleep (in general) Fair  Pain is worse with: walking, bending, sitting, inactivity, standing, and some activites Pain improves with: rest, heat/ice, and medication Relief from Meds: 7  Family History  Problem Relation Age of Onset   Diabetes type II Mother    Colon polyps Mother    Diverticulitis Mother    Diabetes Mother    Heart disease Mother    Osteoporosis Father    Diabetes type II Brother    Uterine cancer Other    Ovarian cancer Other    COPD Paternal Grandfather    Heart attack Maternal Grandmother    Social History   Socioeconomic History   Marital status: Married    Spouse name: Not on file   Number of children: 2   Years of education: Not on file   Highest education level: Not on file  Occupational History   Occupation: Disability    Employer: UNEMPLOYED  Tobacco Use   Smoking status: Former  Types: Cigarettes    Quit date: 06/30/2011    Years since quitting: 11.5   Smokeless tobacco: Never  Vaping Use   Vaping Use: Some days   Substances: Flavoring  Substance and Sexual Activity   Alcohol use: No    Alcohol/week: 0.0 standard drinks of alcohol   Drug use: No   Sexual activity: Yes  Other Topics Concern   Not on file  Social History Narrative   Not on file   Social Determinants of Health   Financial Resource Strain: Not on file  Food Insecurity: Not on file  Transportation Needs: Not on file  Physical Activity: Not on file  Stress: Not on file  Social  Connections: Not on file   Past Surgical History:  Procedure Laterality Date   CESAREAN SECTION     Left elbow surgery     PARTIAL HYSTERECTOMY     TONSILLECTOMY     Past Surgical History:  Procedure Laterality Date   CESAREAN SECTION     Left elbow surgery     PARTIAL HYSTERECTOMY     TONSILLECTOMY     Past Medical History:  Diagnosis Date   Anxiety    Chronic pain    Dr. Wynn Banker   Degenerative disc disease    Spinal, small syrinx   Depression    Esophageal stricture    Status post dilatation   Fibromyalgia    GERD (gastroesophageal reflux disease)    Hiatal hernia    Internal hemorrhoids without mention of complication    Irritable bowel syndrome    Palpitations    No documented arrhythmias   Personal history of colonic polyps 10/04/2000   HYPERPLASTIC POLYP   POTS (postural orthostatic tachycardia syndrome)    Syncope    Negative tilt table test   Syringomyelia and syringobulbia (HCC)    BP 125/85   Pulse 89   Ht 5\' 7"  (1.702 m)   Wt 157 lb 9.6 oz (71.5 kg)   SpO2 99%   BMI 24.68 kg/m   Opioid Risk Score:   Fall Risk Score:  `1  Depression screen [facility] 2/9     12/18/2022    2:53 PM 11/18/2022    3:03 PM 09/30/2022    2:36 PM 07/22/2022    1:55 PM 06/19/2022    2:54 PM 03/17/2022   10:44 AM 10/22/2021    3:15 PM  Depression screen PHQ 2/9  Decreased Interest 1 1 1 1  0 1 0  Down, Depressed, Hopeless 1 1 1 1  0 1 0  PHQ - 2 Score 2 2 2 2  0 2 0    Review of Systems  Constitutional: Negative.   HENT: Negative.    Eyes: Negative.   Respiratory: Negative.    Cardiovascular: Negative.   Gastrointestinal: Negative.   Endocrine: Negative.   Genitourinary: Negative.   Musculoskeletal:  Positive for arthralgias, back pain, myalgias and neck pain.  Skin: Negative.   Allergic/Immunologic: Negative.   Neurological:        Had fainting or seizure type episode 01/08/23 and hit head  Hematological: Negative.   Psychiatric/Behavioral:  Positive for dysphoric  mood.   All other systems reviewed and are negative.      Objective:   Physical Exam Vitals and nursing note reviewed.  Constitutional:      Appearance: Normal appearance.  HENT:     Head:     Comments: Occipital Tenderness with Palpation  Eyes:     Extraocular Movements: Extraocular movements intact.  Cardiovascular:  Rate and Rhythm: Normal rate and regular rhythm.     Pulses: Normal pulses.     Heart sounds: Normal heart sounds.  Pulmonary:     Effort: Pulmonary effort is normal.     Breath sounds: Normal breath sounds.  Musculoskeletal:     Cervical back: Normal range of motion and neck supple.     Comments: Normal Muscle Bulk and Muscle Testing Reveals:  Upper Extremities: Full ROM and Muscle Strength 5/5 Thoracic Paraspinal Tenderness: T-10- T-12 Lumbar Paraspinal Tenderness: L-3-L-5 Lower Extremities: Full ROM and Muscle Strength 5/5 Arises from Table with Ease Narrow Based  Gait     Skin:    General: Skin is warm and dry.  Neurological:     Mental Status: She is alert and oriented to person, place, and time.  Psychiatric:        Mood and Affect: Mood normal.        Behavior: Behavior normal.         Assessment & Plan:  1. Lumbar degenerative disc disease: 01/18/2024. Continue with current treatment regimen: Refilled : HYDROcodone 7.5/325 mg two tablets every 8 hours as needed #180. Second script sent for the following month to accommodate scheduled appointment. We will continue the opioid monitoring program, this consists of regular clinic visits, examinations, urine drug screen, pill counts as well as use of [place] Controlled Substance Reporting system. A 12 month History has been reviewed on the [place] Controlled Substance Reporting System on 06/17/20234 2. Thoracic Spondylosis: Continue current treatment with HEP and  current medication regime. 01/18/2023. 3 Cervicalgia/ Cervical Radiculitis/ Cervical spondylosis without evidence of  myelopathy: Continue Gabapentin. Continue to Monitor. 01/18/2023.  Lyrica discontinued due to adverse effects. Continue with exercise and heat therapy. 01/18/2023 4. Fibromyalgia:Continue with Heat and  exercise Regimen. Continue Gabapentin. 01/18/2023  5.Depression and anxiety: PCP Following. 01/18/2023 6. Orthostatic hypotension: Cardiology Following. 01/18/2023 7. Polyarthralgia: Continue to monitor. 06/172024 8. Chronic Bilateral  Shoulder Pain:  Continue current medication regime. Continue HEP as Tolerated. 06/17//2024. 9. Muscle Spasm:No complaints today.  Continue to monitor.  01/18/2023. 10. Neuropathic Pain: Continue Gabapentin. Continue to Monitor.  01/18/2023  11. Right elbow Pain: No complaints today. Continue current medication regimen. Continue to monitor. 01/18/2023 12. Right  Knee with OA: Continue HEP as Tolerated. Continue current medication regimen. Continue to monitor. 01/18/2023  13. Right Hip Pain: No complaints today. Continue with HEP as Tolerated. Continue to Monitor. 01/18/2023  14. Fainting Spell on 01/08/2023. She didn't seek Medical Attention. She refuses ED evaluation, She refuses EMS. Mr. Barahona was called regarding the above, he states he will take Mrs. Wahba tp  [facility] ED today for evaluation. Mrs. Rafferty was instructed to call her PCP and encouraged to go to ED for evaluation. She states she would go to [facility] ED for Evaluation and verbalizes understanding.    F/U in 1 Month

## 2023-01-18 NOTE — Patient Instructions (Signed)
Please go to the Emergency Room to be evaluated:  You have refused EMS.  Also you stated you will call Dr [name], please call Dr [name] office today.   I spoke with your Husband he stated "he would take you to the Emergency room, you refused to go to [facility] and stated you will go to [facility] today.

## 2023-01-28 DIAGNOSIS — S0990XA Unspecified injury of head, initial encounter: Secondary | ICD-10-CM | POA: Diagnosis not present

## 2023-01-28 DIAGNOSIS — R42 Dizziness and giddiness: Secondary | ICD-10-CM | POA: Diagnosis not present

## 2023-01-28 DIAGNOSIS — M542 Cervicalgia: Secondary | ICD-10-CM | POA: Diagnosis not present

## 2023-01-28 DIAGNOSIS — R55 Syncope and collapse: Secondary | ICD-10-CM | POA: Diagnosis not present

## 2023-01-28 DIAGNOSIS — I10 Essential (primary) hypertension: Secondary | ICD-10-CM | POA: Diagnosis not present

## 2023-02-01 ENCOUNTER — Other Ambulatory Visit (HOSPITAL_COMMUNITY): Payer: Self-pay | Admitting: Internal Medicine

## 2023-02-01 DIAGNOSIS — R55 Syncope and collapse: Secondary | ICD-10-CM

## 2023-02-01 DIAGNOSIS — M542 Cervicalgia: Secondary | ICD-10-CM

## 2023-02-01 DIAGNOSIS — R42 Dizziness and giddiness: Secondary | ICD-10-CM

## 2023-02-01 DIAGNOSIS — F0781 Postconcussional syndrome: Secondary | ICD-10-CM

## 2023-02-01 DIAGNOSIS — S0990XA Unspecified injury of head, initial encounter: Secondary | ICD-10-CM

## 2023-02-02 DIAGNOSIS — I10 Essential (primary) hypertension: Secondary | ICD-10-CM | POA: Diagnosis not present

## 2023-02-02 DIAGNOSIS — R55 Syncope and collapse: Secondary | ICD-10-CM | POA: Diagnosis not present

## 2023-02-05 ENCOUNTER — Ambulatory Visit (HOSPITAL_COMMUNITY)
Admission: RE | Admit: 2023-02-05 | Discharge: 2023-02-05 | Disposition: A | Discharge status: Home / Self Care | Payer: Medicare Other | Source: Ambulatory Visit | Attending: Internal Medicine | Admitting: Internal Medicine

## 2023-02-05 DIAGNOSIS — R42 Dizziness and giddiness: Secondary | ICD-10-CM

## 2023-02-05 DIAGNOSIS — M542 Cervicalgia: Secondary | ICD-10-CM

## 2023-02-05 DIAGNOSIS — F0781 Postconcussional syndrome: Secondary | ICD-10-CM | POA: Diagnosis present

## 2023-02-05 DIAGNOSIS — S0990XA Unspecified injury of head, initial encounter: Secondary | ICD-10-CM

## 2023-02-05 DIAGNOSIS — R55 Syncope and collapse: Secondary | ICD-10-CM | POA: Diagnosis not present

## 2023-02-11 ENCOUNTER — Ambulatory Visit (HOSPITAL_BASED_OUTPATIENT_CLINIC_OR_DEPARTMENT_OTHER): Payer: Medicare Other

## 2023-02-11 ENCOUNTER — Ambulatory Visit (HOSPITAL_COMMUNITY)
Admission: RE | Admit: 2023-02-11 | Discharge: 2023-02-11 | Disposition: A | Discharge status: Home / Self Care | Payer: Medicare Other | Source: Ambulatory Visit | Attending: Internal Medicine | Admitting: Internal Medicine

## 2023-02-11 ENCOUNTER — Encounter (HOSPITAL_BASED_OUTPATIENT_CLINIC_OR_DEPARTMENT_OTHER): Payer: Self-pay

## 2023-02-11 DIAGNOSIS — R42 Dizziness and giddiness: Secondary | ICD-10-CM | POA: Insufficient documentation

## 2023-02-11 DIAGNOSIS — R55 Syncope and collapse: Secondary | ICD-10-CM | POA: Insufficient documentation

## 2023-02-11 DIAGNOSIS — F0781 Postconcussional syndrome: Secondary | ICD-10-CM | POA: Insufficient documentation

## 2023-02-11 DIAGNOSIS — M542 Cervicalgia: Secondary | ICD-10-CM | POA: Insufficient documentation

## 2023-02-11 DIAGNOSIS — S0990XA Unspecified injury of head, initial encounter: Secondary | ICD-10-CM | POA: Diagnosis not present

## 2023-02-12 ENCOUNTER — Encounter: Payer: Self-pay | Admitting: General Practice

## 2023-02-25 ENCOUNTER — Other Ambulatory Visit: Payer: Self-pay | Admitting: Registered Nurse

## 2023-03-01 ENCOUNTER — Encounter: Payer: Medicare Other | Attending: Physical Medicine & Rehabilitation | Admitting: Registered Nurse

## 2023-03-01 ENCOUNTER — Encounter: Payer: Self-pay | Admitting: Registered Nurse

## 2023-03-01 VITALS — BP 102/73 | HR 99 | Ht 67.0 in | Wt 155.8 lb

## 2023-03-01 DIAGNOSIS — G894 Chronic pain syndrome: Secondary | ICD-10-CM | POA: Insufficient documentation

## 2023-03-01 DIAGNOSIS — G5793 Unspecified mononeuropathy of bilateral lower limbs: Secondary | ICD-10-CM | POA: Insufficient documentation

## 2023-03-01 DIAGNOSIS — M542 Cervicalgia: Secondary | ICD-10-CM | POA: Diagnosis not present

## 2023-03-01 DIAGNOSIS — Z5181 Encounter for therapeutic drug level monitoring: Secondary | ICD-10-CM | POA: Insufficient documentation

## 2023-03-01 DIAGNOSIS — M25512 Pain in left shoulder: Secondary | ICD-10-CM | POA: Insufficient documentation

## 2023-03-01 DIAGNOSIS — M2550 Pain in unspecified joint: Secondary | ICD-10-CM | POA: Insufficient documentation

## 2023-03-01 DIAGNOSIS — M25511 Pain in right shoulder: Secondary | ICD-10-CM | POA: Insufficient documentation

## 2023-03-01 DIAGNOSIS — M5412 Radiculopathy, cervical region: Secondary | ICD-10-CM | POA: Insufficient documentation

## 2023-03-01 DIAGNOSIS — M47812 Spondylosis without myelopathy or radiculopathy, cervical region: Secondary | ICD-10-CM | POA: Diagnosis not present

## 2023-03-01 DIAGNOSIS — M546 Pain in thoracic spine: Secondary | ICD-10-CM | POA: Insufficient documentation

## 2023-03-01 DIAGNOSIS — Z79891 Long term (current) use of opiate analgesic: Secondary | ICD-10-CM | POA: Insufficient documentation

## 2023-03-01 DIAGNOSIS — M797 Fibromyalgia: Secondary | ICD-10-CM | POA: Insufficient documentation

## 2023-03-01 DIAGNOSIS — M5416 Radiculopathy, lumbar region: Secondary | ICD-10-CM | POA: Insufficient documentation

## 2023-03-01 DIAGNOSIS — M792 Neuralgia and neuritis, unspecified: Secondary | ICD-10-CM | POA: Insufficient documentation

## 2023-03-01 DIAGNOSIS — G8929 Other chronic pain: Secondary | ICD-10-CM | POA: Insufficient documentation

## 2023-03-01 MED ORDER — GABAPENTIN 100 MG PO CAPS: 100.0000 mg | ORAL_CAPSULE | Freq: Four times a day (QID) | ORAL | 2 refills | Status: DC

## 2023-03-01 MED ORDER — HYDROCODONE-ACETAMINOPHEN 7.5-325 MG PO TABS: 2.0000 | ORAL_TABLET | Freq: Three times a day (TID) | ORAL | 0 refills | Status: DC | PRN

## 2023-03-01 NOTE — Progress Notes (Signed)
Subjective:    Patient ID: [name], female    DOB: Dec 10, 1975, 47 y.o.   MRN: [id number]  HPI: [name] is a 47 y.o. female who returns for follow up appointment for chronic pain and medication refill. She states her pain is located in her neck radiating into her bilateral shoulders, mid- lower back .pain ( she described her pain as deep pain). Also reports bilateral hands and bilateral feet  with tingling and burning and generalized joint pain. She rates her pain 6. Her current exercise regime is walking and performing stretching exercises.  Ms. Steerman Morphine equivalent is 45.00 MME. She  is also prescribed Clonazepam  by Dr. [name]  .We have discussed the black box warning of using opioids and benzodiazepines. I highlighted the dangers of using these drugs together and discussed the adverse events including respiratory suppression, overdose, cognitive impairment and importance of compliance with current regimen. We will continue to monitor and adjust as indicated.    Last UDS was Performed on 11/18/2022, it was consistent.     Pain Inventory Average Pain 7 Pain Right Now 6 My pain is intermittent, sharp, burning, dull, stabbing, tingling, and aching  In the last 24 hours, has pain interfered with the following? General activity 7 Relation with others 7 Enjoyment of life 7 What TIME of day is your pain at its worst? morning , evening, and night Sleep (in general) Fair  Pain is worse with: walking, bending, sitting, inactivity, standing, and some activites Pain improves with: rest, heat/ice, and medication Relief from Meds: 6  Family History  Problem Relation Age of Onset   Diabetes type II Mother    Colon polyps Mother    Diverticulitis Mother    Diabetes Mother    Heart disease Mother    Osteoporosis Father    Diabetes type II Brother    Uterine cancer Other    Ovarian cancer Other    COPD Paternal Grandfather    Heart attack Maternal Grandmother     Social History   Socioeconomic History   Marital status: Married    Spouse name: Not on file   Number of children: 2   Years of education: Not on file   Highest education level: Not on file  Occupational History   Occupation: Disability    Employer: UNEMPLOYED  Tobacco Use   Smoking status: Former    Current packs/day: 0.00    Types: Cigarettes    Quit date: 06/30/2011    Years since quitting: 11.6   Smokeless tobacco: Never  Vaping Use   Vaping status: Some Days   Substances: Flavoring  Substance and Sexual Activity   Alcohol use: No    Alcohol/week: 0.0 standard drinks of alcohol   Drug use: No   Sexual activity: Yes  Other Topics Concern   Not on file  Social History Narrative   Not on file   Social Determinants of Health   Financial Resource Strain: Not on file  Food Insecurity: Not on file  Transportation Needs: Not on file  Physical Activity: Not on file  Stress: Not on file  Social Connections: Not on file   Past Surgical History:  Procedure Laterality Date   CESAREAN SECTION     Left elbow surgery     PARTIAL HYSTERECTOMY     TONSILLECTOMY     Past Surgical History:  Procedure Laterality Date   CESAREAN SECTION     Left elbow surgery     PARTIAL HYSTERECTOMY  TONSILLECTOMY     Past Medical History:  Diagnosis Date   Anxiety    Chronic pain    Dr. Wynn Banker   Degenerative disc disease    Spinal, small syrinx   Depression    Esophageal stricture    Status post dilatation   Fibromyalgia    GERD (gastroesophageal reflux disease)    Hiatal hernia    Internal hemorrhoids without mention of complication    Irritable bowel syndrome    Palpitations    No documented arrhythmias   Personal history of colonic polyps 10/04/2000   HYPERPLASTIC POLYP   POTS (postural orthostatic tachycardia syndrome)    Syncope    Negative tilt table test   Syringomyelia and syringobulbia (HCC)    BP 102/73   Pulse 99   Ht 5\' 7"  (1.702 m)   Wt 155 lb 12.8  oz (70.7 kg)   SpO2 99%   BMI 24.40 kg/m   Opioid Risk Score:   Fall Risk Score:  `1  Depression screen [facility] 2/9     01/18/2023    3:48 PM 12/18/2022    2:53 PM 11/18/2022    3:03 PM 09/30/2022    2:36 PM 07/22/2022    1:55 PM 06/19/2022    2:54 PM 03/17/2022   10:44 AM  Depression screen PHQ 2/9  Decreased Interest 1 1 1 1 1  0 1  Down, Depressed, Hopeless 1 1 1 1 1  0 1  PHQ - 2 Score 2 2 2 2 2  0 2     Review of Systems  Musculoskeletal:  Positive for back pain and neck pain.       Right upper leg pain Bilateral hand pain Bilateral foot pain  Neurological:  Positive for headaches.  All other systems reviewed and are negative.     Objective:   Physical Exam Vitals and nursing note reviewed.  Constitutional:      Appearance: Normal appearance.  Neck:     Comments: Cervical Paraspinal Tenderness: C-5-C-6 Cardiovascular:     Rate and Rhythm: Normal rate and regular rhythm.     Pulses: Normal pulses.     Heart sounds: Normal heart sounds.  Pulmonary:     Effort: Pulmonary effort is normal.     Breath sounds: Normal breath sounds.  Musculoskeletal:     Cervical back: Normal range of motion and neck supple.     Comments: Normal Muscle Bulk and Muscle Testing Reveals:  Upper Extremities: Full ROM and Muscle Strength 5/5 Bilateral AC Joint Tenderness Thoracic Paraspinal tenderness: T-7-T-9  Lumbar Paraspinal Tenderness: L-3-L-5 Lower Extremities: Full ROM and Muscle Strength 5/5  Right Lower Extremity Flexion : Produces Pain into his Right Hip Pain and Right Lower Extremity Arises from chair with ease Narrow Based Gait     Skin:    General: Skin is warm and dry.  Neurological:     Mental Status: She is alert and oriented to person, place, and time.  Psychiatric:        Mood and Affect: Mood normal.        Behavior: Behavior normal.         Assessment & Plan:  1. Lumbar degenerative disc disease: 02/29/2024. Continue with current treatment regimen: Refilled :  HYDROcodone 7.5/325 mg two tablets every 8 hours as needed #180. Second script sent for the following month to accommodate scheduled appointment. We will continue the opioid monitoring program, this consists of regular clinic visits, examinations, urine drug screen, pill counts as well as use  of [place] Controlled Substance Reporting system. A 12 month History has been reviewed on the [place] Controlled Substance Reporting System on 07/29/20234 2. Thoracic Spondylosis: Continue current treatment with HEP and  current medication regime. 03/01/2023. 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 Continue to Monitor. 03/01/2023.  Lyrica discontinued due to adverse effects. Continue with exercise and heat therapy. 03/01/2023 4. Fibromyalgia:Continue with Heat and  exercise Regimen. Continue Gabapentin. 03/01/2023  5.Depression and anxiety: PCP Following. 03/01/2023 6. Orthostatic hypotension: Cardiology Following. 03/01/2023 7. Polyarthralgia: Continue HEP as tolerated . Continue to monitor. 07/292024 8. Chronic Bilateral  Shoulder Pain:  Continue current medication regime. Continue HEP as Tolerated. 07/29//2024. 9. Muscle Spasm:No complaints today.  Continue to monitor.  03/01/2023. 10. Neuropathic Pain: Continue Gabapentin. Continue to Monitor.  03/01/2023  11. Right elbow Pain: No complaints today. Continue current medication regimen. Continue to monitor. 03/01/2023 12. Right  Knee with OA: Continue HEP as Tolerated. Continue current medication regimen. Continue to monitor. 03/01/2023  13. Right Hip Pain: No complaints today. Continue with HEP as Tolerated. Continue to Monitor. 03/01/2023   F/U in 1 Month

## 2023-03-03 ENCOUNTER — Encounter: Payer: Medicare Other | Admitting: Registered Nurse

## 2023-03-04 DIAGNOSIS — E2749 Other adrenocortical insufficiency: Secondary | ICD-10-CM | POA: Diagnosis not present

## 2023-04-06 NOTE — Progress Notes (Deleted)
Subjective:    Patient ID: [name], female    DOB: 12/11/1975, 47 y.o.   MRN: [id number]  HPI   Pain Inventory Average Pain {NUMBERS; 0-10:5044} Pain Right Now {NUMBERS; 0-10:5044} My pain is {PAIN DESCRIPTION:21022940}  In the last 24 hours, has pain interfered with the following? General activity {NUMBERS; 0-10:5044} Relation with others {NUMBERS; 0-10:5044} Enjoyment of life {NUMBERS; 0-10:5044} What TIME of day is your pain at its worst? {time of day:24191} Sleep (in general) {BHH GOOD/FAIR/POOR:22877}  Pain is worse with: {ACTIVITIES:21022942} Pain improves with: {PAIN IMPROVES VHQI:69629528} Relief from Meds: {NUMBERS; 0-10:5044}  Family History  Problem Relation Age of Onset   Diabetes type II Mother    Colon polyps Mother    Diverticulitis Mother    Diabetes Mother    Heart disease Mother    Osteoporosis Father    Diabetes type II Brother    Uterine cancer Other    Ovarian cancer Other    COPD Paternal Grandfather    Heart attack Maternal Grandmother    Social History   Socioeconomic History   Marital status: Married    Spouse name: Not on file   Number of children: 2   Years of education: Not on file   Highest education level: Not on file  Occupational History   Occupation: Disability    Employer: UNEMPLOYED  Tobacco Use   Smoking status: Former    Current packs/day: 0.00    Types: Cigarettes    Quit date: 06/30/2011    Years since quitting: 11.7   Smokeless tobacco: Never  Vaping Use   Vaping status: Some Days   Substances: Flavoring  Substance and Sexual Activity   Alcohol use: No    Alcohol/week: 0.0 standard drinks of alcohol   Drug use: No   Sexual activity: Yes  Other Topics Concern   Not on file  Social History Narrative   Not on file   Social Determinants of Health   Financial Resource Strain: Not on file  Food Insecurity: Not on file  Transportation Needs: Not on file  Physical Activity: Not on file  Stress: Not on  file  Social Connections: Not on file   Past Surgical History:  Procedure Laterality Date   CESAREAN SECTION     Left elbow surgery     PARTIAL HYSTERECTOMY     TONSILLECTOMY     Past Surgical History:  Procedure Laterality Date   CESAREAN SECTION     Left elbow surgery     PARTIAL HYSTERECTOMY     TONSILLECTOMY     Past Medical History:  Diagnosis Date   Anxiety    Chronic pain    Dr. Wynn Banker   Degenerative disc disease    Spinal, small syrinx   Depression    Esophageal stricture    Status post dilatation   Fibromyalgia    GERD (gastroesophageal reflux disease)    Hiatal hernia    Internal hemorrhoids without mention of complication    Irritable bowel syndrome    Palpitations    No documented arrhythmias   Personal history of colonic polyps 10/04/2000   HYPERPLASTIC POLYP   POTS (postural orthostatic tachycardia syndrome)    Syncope    Negative tilt table test   Syringomyelia and syringobulbia (HCC)    There were no vitals taken for this visit.  Opioid Risk Score:   Fall Risk Score:  `1  Depression screen [facility] 2/9     01/18/2023    3:48 PM 12/18/2022  2:53 PM 11/18/2022    3:03 PM 09/30/2022    2:36 PM 07/22/2022    1:55 PM 06/19/2022    2:54 PM 03/17/2022   10:44 AM  Depression screen PHQ 2/9  Decreased Interest 1 1 1 1 1  0 1  Down, Depressed, Hopeless 1 1 1 1 1  0 1  PHQ - 2 Score 2 2 2 2 2  0 2    Review of Systems     Objective:   Physical Exam        Assessment & Plan:

## 2023-04-08 ENCOUNTER — Encounter: Payer: Medicare Other | Attending: Physical Medicine & Rehabilitation | Admitting: Registered Nurse

## 2023-04-08 DIAGNOSIS — M5412 Radiculopathy, cervical region: Secondary | ICD-10-CM | POA: Insufficient documentation

## 2023-04-08 DIAGNOSIS — M4716 Other spondylosis with myelopathy, lumbar region: Secondary | ICD-10-CM | POA: Insufficient documentation

## 2023-04-08 DIAGNOSIS — G5793 Unspecified mononeuropathy of bilateral lower limbs: Secondary | ICD-10-CM | POA: Insufficient documentation

## 2023-04-08 DIAGNOSIS — M542 Cervicalgia: Secondary | ICD-10-CM | POA: Insufficient documentation

## 2023-04-08 DIAGNOSIS — M546 Pain in thoracic spine: Secondary | ICD-10-CM | POA: Insufficient documentation

## 2023-04-08 DIAGNOSIS — M2550 Pain in unspecified joint: Secondary | ICD-10-CM | POA: Insufficient documentation

## 2023-04-08 DIAGNOSIS — M47812 Spondylosis without myelopathy or radiculopathy, cervical region: Secondary | ICD-10-CM | POA: Insufficient documentation

## 2023-04-08 DIAGNOSIS — Z5181 Encounter for therapeutic drug level monitoring: Secondary | ICD-10-CM | POA: Insufficient documentation

## 2023-04-08 DIAGNOSIS — G8929 Other chronic pain: Secondary | ICD-10-CM | POA: Insufficient documentation

## 2023-04-08 DIAGNOSIS — G894 Chronic pain syndrome: Secondary | ICD-10-CM | POA: Insufficient documentation

## 2023-04-08 DIAGNOSIS — M797 Fibromyalgia: Secondary | ICD-10-CM | POA: Insufficient documentation

## 2023-04-08 DIAGNOSIS — M25511 Pain in right shoulder: Secondary | ICD-10-CM | POA: Insufficient documentation

## 2023-04-08 DIAGNOSIS — M25512 Pain in left shoulder: Secondary | ICD-10-CM | POA: Insufficient documentation

## 2023-04-08 DIAGNOSIS — Z79891 Long term (current) use of opiate analgesic: Secondary | ICD-10-CM | POA: Insufficient documentation

## 2023-04-08 DIAGNOSIS — M792 Neuralgia and neuritis, unspecified: Secondary | ICD-10-CM | POA: Insufficient documentation

## 2023-04-13 ENCOUNTER — Telehealth: Payer: Self-pay | Admitting: Registered Nurse

## 2023-04-13 MED ORDER — HYDROCODONE-ACETAMINOPHEN 7.5-325 MG PO TABS: 2.0000 | ORAL_TABLET | Freq: Three times a day (TID) | ORAL | 0 refills | Status: DC | PRN

## 2023-04-13 NOTE — Telephone Encounter (Signed)
She called to reschedule her missed appt and wanted to let us know that she's about out of her meds

## 2023-04-13 NOTE — Telephone Encounter (Signed)
PMP was Reviewed.  Hydrocodone e-scribed to pharmacy.  Call placed to [name]  regarding the above, she verbalizes understanding.

## 2023-04-14 ENCOUNTER — Ambulatory Visit: Payer: Medicare Other | Admitting: Registered Nurse

## 2023-04-22 ENCOUNTER — Encounter: Payer: Self-pay | Admitting: Registered Nurse

## 2023-04-22 ENCOUNTER — Encounter: Payer: Medicare Other | Admitting: Registered Nurse

## 2023-04-22 VITALS — BP 120/86 | HR 95 | Ht 67.0 in | Wt 162.0 lb

## 2023-04-22 DIAGNOSIS — Z79891 Long term (current) use of opiate analgesic: Secondary | ICD-10-CM

## 2023-04-22 DIAGNOSIS — G894 Chronic pain syndrome: Secondary | ICD-10-CM

## 2023-04-22 DIAGNOSIS — G5793 Unspecified mononeuropathy of bilateral lower limbs: Secondary | ICD-10-CM | POA: Diagnosis not present

## 2023-04-22 DIAGNOSIS — M25511 Pain in right shoulder: Secondary | ICD-10-CM

## 2023-04-22 DIAGNOSIS — G8929 Other chronic pain: Secondary | ICD-10-CM

## 2023-04-22 DIAGNOSIS — M2550 Pain in unspecified joint: Secondary | ICD-10-CM | POA: Diagnosis not present

## 2023-04-22 DIAGNOSIS — M542 Cervicalgia: Secondary | ICD-10-CM | POA: Diagnosis not present

## 2023-04-22 DIAGNOSIS — M47812 Spondylosis without myelopathy or radiculopathy, cervical region: Secondary | ICD-10-CM

## 2023-04-22 DIAGNOSIS — M4716 Other spondylosis with myelopathy, lumbar region: Secondary | ICD-10-CM | POA: Diagnosis not present

## 2023-04-22 DIAGNOSIS — M5412 Radiculopathy, cervical region: Secondary | ICD-10-CM | POA: Diagnosis not present

## 2023-04-22 DIAGNOSIS — M792 Neuralgia and neuritis, unspecified: Secondary | ICD-10-CM

## 2023-04-22 DIAGNOSIS — Z5181 Encounter for therapeutic drug level monitoring: Secondary | ICD-10-CM | POA: Diagnosis not present

## 2023-04-22 DIAGNOSIS — M797 Fibromyalgia: Secondary | ICD-10-CM

## 2023-04-22 DIAGNOSIS — M546 Pain in thoracic spine: Secondary | ICD-10-CM

## 2023-04-22 DIAGNOSIS — M25512 Pain in left shoulder: Secondary | ICD-10-CM | POA: Diagnosis not present

## 2023-04-22 MED ORDER — HYDROCODONE-ACETAMINOPHEN 7.5-325 MG PO TABS: 2.0000 | ORAL_TABLET | Freq: Three times a day (TID) | ORAL | 0 refills | Status: DC | PRN

## 2023-04-22 NOTE — Progress Notes (Signed)
Subjective:    Patient ID: [name], female    DOB: [birth year], 47 y.o.   MRN: [id number]  HPI: [name] is a 47 y.o. female who returns for follow up appointment for chronic pain and medication refill. She states her pain is located in her neck radiating into her bilateral shoulders, Bilateral hands with tingling and burning, mid- lower back pain and bilateral feet with tingling and burning. She rates her pain 6. Her current exercise regime is walking and performing stretching exercises.  Ms. Seeton Morphine equivalent is 45.00 MME.She is also prescribed Clonazepam  by Dr. [name] .We have discussed the black box warning of using opioids and benzodiazepines. I highlighted the dangers of using these drugs together and discussed the adverse events including respiratory suppression, overdose, cognitive impairment and importance of compliance with current regimen. We will continue to monitor and adjust as indicated.    UDS ordered today.     Pain Inventory Average Pain 7 Pain Right Now 6 My pain is intermittent, sharp, burning, dull, stabbing, tingling, and aching  In the last 24 hours, has pain interfered with the following? General activity 7 Relation with others 7 Enjoyment of life 7 What TIME of day is your pain at its worst? morning , evening, and night Sleep (in general) Fair  Pain is worse with: walking, bending, sitting, inactivity, standing, and some activites Pain improves with: rest, heat/ice, and medication Relief from Meds: 6  Family History  Problem Relation Age of Onset   Diabetes type II Mother    Colon polyps Mother    Diverticulitis Mother    Diabetes Mother    Heart disease Mother    Osteoporosis Father    Diabetes type II Brother    Uterine cancer Other    Ovarian cancer Other    COPD Paternal Grandfather    Heart attack Maternal Grandmother    Social History   Socioeconomic History   Marital status: Married    Spouse name: Not on file    Number of children: 2   Years of education: Not on file   Highest education level: Not on file  Occupational History   Occupation: Disability    Employer: UNEMPLOYED  Tobacco Use   Smoking status: Former    Current packs/day: 0.00    Types: Cigarettes    Quit date: 06/30/2011    Years since quitting: 11.8   Smokeless tobacco: Never  Vaping Use   Vaping status: Some Days   Substances: Flavoring  Substance and Sexual Activity   Alcohol use: No    Alcohol/week: 0.0 standard drinks of alcohol   Drug use: No   Sexual activity: Yes  Other Topics Concern   Not on file  Social History Narrative   Not on file   Social Determinants of Health   Financial Resource Strain: Not on file  Food Insecurity: Not on file  Transportation Needs: Not on file  Physical Activity: Not on file  Stress: Not on file  Social Connections: Not on file   Past Surgical History:  Procedure Laterality Date   CESAREAN SECTION     Left elbow surgery     PARTIAL HYSTERECTOMY     TONSILLECTOMY     Past Surgical History:  Procedure Laterality Date   CESAREAN SECTION     Left elbow surgery     PARTIAL HYSTERECTOMY     TONSILLECTOMY     Past Medical History:  Diagnosis Date   Anxiety  Chronic pain    Dr. Wynn Banker   Degenerative disc disease    Spinal, small syrinx   Depression    Esophageal stricture    Status post dilatation   Fibromyalgia    GERD (gastroesophageal reflux disease)    Hiatal hernia    Internal hemorrhoids without mention of complication    Irritable bowel syndrome    Palpitations    No documented arrhythmias   Personal history of colonic polyps 10/04/2000   HYPERPLASTIC POLYP   POTS (postural orthostatic tachycardia syndrome)    Syncope    Negative tilt table test   Syringomyelia and syringobulbia (HCC)    BP 120/86   Pulse 95   Ht 5\' 7"  (1.702 m)   Wt 162 lb (73.5 kg)   SpO2 97%   BMI 25.37 kg/m   Opioid Risk Score:   Fall Risk Score:  `1  Depression screen  [facility] 2/9     01/18/2023    3:48 PM 12/18/2022    2:53 PM 11/18/2022    3:03 PM 09/30/2022    2:36 PM 07/22/2022    1:55 PM 06/19/2022    2:54 PM 03/17/2022   10:44 AM  Depression screen PHQ 2/9  Decreased Interest 1 1 1 1 1  0 1  Down, Depressed, Hopeless 1 1 1 1 1  0 1  PHQ - 2 Score 2 2 2 2 2  0 2     Review of Systems  Musculoskeletal:  Positive for back pain and neck pain.       Head B/L hand knee foot pain Rt elbow shoulder pain  All other systems reviewed and are negative.      Objective:   Physical Exam Vitals and nursing note reviewed.  Constitutional:      Appearance: Normal appearance.  Cardiovascular:     Rate and Rhythm: Normal rate and regular rhythm.     Pulses: Normal pulses.     Heart sounds: Normal heart sounds.  Pulmonary:     Effort: Pulmonary effort is normal.     Breath sounds: Normal breath sounds.  Musculoskeletal:     Cervical back: Normal range of motion and neck supple.     Comments: Normal Muscle Bulk and Muscle Testing Reveals:  Upper Extremities: Full ROM and Muscle Strength 5/5 Bilateral AC Joint Tenderness Thoracic Paraspinal Tenderness: T-7-T-9 Lumbar Paraspinal Tenderness: L-4-L-5 Lower Extremities: Full ROM and Muscle Strength 5/5 Arises from Table with ease Narrow Based  Gait     Skin:    General: Skin is warm and dry.  Neurological:     Mental Status: She is alert and oriented to person, place, and time.  Psychiatric:        Mood and Affect: Mood normal.        Behavior: Behavior normal.         Assessment & Plan:  1. Lumbar degenerative disc disease: 04/21/2024. Continue with current treatment regimen: Refilled : HYDROcodone 7.5/325 mg two tablets every 8 hours as needed #180. Second script sent for the following month to accommodate scheduled appointment. We will continue the opioid monitoring program, this consists of regular clinic visits, examinations, urine drug screen, pill counts as well as use of [place]  Controlled Substance Reporting system. A 12 month History has been reviewed on the [place] Controlled Substance Reporting System on 09/19/20234 2. Thoracic Spondylosis: Continue current treatment with HEP and  current medication regime. 04/22/2023. 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 Continue  to Monitor. 04/22/2023.  Lyrica discontinued due to adverse effects. Continue with exercise and heat therapy. 04/22/2023 4. Fibromyalgia:Continue with Heat and  exercise Regimen. Continue Gabapentin. 04/22/2023  5.Depression and anxiety: PCP Following. 04/22/2023 6. Orthostatic hypotension: Cardiology Following. 04/22/2023 7. Polyarthralgia: Continue HEP as tolerated . Continue to monitor. 09/192024 8. Chronic Bilateral  Shoulder Pain:  Continue current medication regime. Continue HEP as Tolerated. 09/19//2024. 9. Muscle Spasm:No complaints today.  Continue to monitor.  04/22/2023. 10. Neuropathic Pain: Continue Gabapentin. Continue to Monitor.  04/22/2023  11. Right elbow Pain: No complaints today. Continue current medication regimen. Continue to monitor. 04/22/2023 12. Right  Knee with OA: Continue HEP as Tolerated. Continue current medication regimen. Continue to monitor. 04/22/2023  13. Right Hip Pain: No complaints today. Continue with HEP as Tolerated. Continue to Monitor. 04/22/2023   F/U in 1 Month

## 2023-04-29 LAB — TOXASSURE SELECT,+ANTIDEPR,UR

## 2023-05-04 DIAGNOSIS — I2699 Other pulmonary embolism without acute cor pulmonale: Secondary | ICD-10-CM

## 2023-05-04 HISTORY — DX: Other pulmonary embolism without acute cor pulmonale: I26.99

## 2023-05-11 ENCOUNTER — Emergency Department (HOSPITAL_COMMUNITY): Payer: Medicare Other

## 2023-05-11 ENCOUNTER — Encounter (HOSPITAL_COMMUNITY): Payer: Self-pay | Admitting: Emergency Medicine

## 2023-05-11 ENCOUNTER — Other Ambulatory Visit: Payer: Self-pay

## 2023-05-11 ENCOUNTER — Observation Stay (HOSPITAL_COMMUNITY)
Admission: EM | Admit: 2023-05-11 | Discharge: 2023-05-12 | Disposition: A | Discharge status: Home / Self Care | Payer: Medicare Other | Attending: Internal Medicine | Admitting: Internal Medicine

## 2023-05-11 DIAGNOSIS — G8929 Other chronic pain: Secondary | ICD-10-CM | POA: Insufficient documentation

## 2023-05-11 DIAGNOSIS — I2699 Other pulmonary embolism without acute cor pulmonale: Secondary | ICD-10-CM | POA: Diagnosis not present

## 2023-05-11 DIAGNOSIS — R079 Chest pain, unspecified: Secondary | ICD-10-CM | POA: Diagnosis not present

## 2023-05-11 DIAGNOSIS — I10 Essential (primary) hypertension: Secondary | ICD-10-CM | POA: Diagnosis not present

## 2023-05-11 DIAGNOSIS — Z794 Long term (current) use of insulin: Secondary | ICD-10-CM | POA: Diagnosis not present

## 2023-05-11 DIAGNOSIS — J90 Pleural effusion, not elsewhere classified: Secondary | ICD-10-CM | POA: Diagnosis not present

## 2023-05-11 DIAGNOSIS — Z87891 Personal history of nicotine dependence: Secondary | ICD-10-CM | POA: Insufficient documentation

## 2023-05-11 DIAGNOSIS — R000 Tachycardia, unspecified: Secondary | ICD-10-CM | POA: Diagnosis not present

## 2023-05-11 DIAGNOSIS — Z79899 Other long term (current) drug therapy: Secondary | ICD-10-CM | POA: Insufficient documentation

## 2023-05-11 DIAGNOSIS — J9811 Atelectasis: Secondary | ICD-10-CM | POA: Insufficient documentation

## 2023-05-11 DIAGNOSIS — M549 Dorsalgia, unspecified: Secondary | ICD-10-CM | POA: Insufficient documentation

## 2023-05-11 DIAGNOSIS — M797 Fibromyalgia: Secondary | ICD-10-CM | POA: Diagnosis not present

## 2023-05-11 DIAGNOSIS — R918 Other nonspecific abnormal finding of lung field: Secondary | ICD-10-CM | POA: Diagnosis not present

## 2023-05-11 DIAGNOSIS — R0781 Pleurodynia: Secondary | ICD-10-CM | POA: Diagnosis not present

## 2023-05-11 LAB — PREGNANCY, URINE: Preg Test, Ur: NEGATIVE

## 2023-05-11 LAB — CBC
HCT: 31.2 % — ABNORMAL LOW (ref 36.0–46.0)
Hemoglobin: 9.9 g/dL — ABNORMAL LOW (ref 12.0–15.0)
MCH: 31.7 pg (ref 26.0–34.0)
MCHC: 31.7 g/dL (ref 30.0–36.0)
MCV: 100 fL (ref 80.0–100.0)
Platelets: 246 10*3/uL (ref 150–400)
RBC: 3.12 MIL/uL — ABNORMAL LOW (ref 3.87–5.11)
RDW: 12.3 % (ref 11.5–15.5)
WBC: 7.8 10*3/uL (ref 4.0–10.5)
nRBC: 0 % (ref 0.0–0.2)

## 2023-05-11 LAB — URINALYSIS, ROUTINE W REFLEX MICROSCOPIC
Bilirubin Urine: NEGATIVE
Glucose, UA: NEGATIVE mg/dL
Ketones, ur: NEGATIVE mg/dL
Leukocytes,Ua: NEGATIVE
Nitrite: NEGATIVE
Protein, ur: 100 mg/dL — AB
Specific Gravity, Urine: 1.028 (ref 1.005–1.030)
pH: 5 (ref 5.0–8.0)

## 2023-05-11 LAB — TROPONIN I (HIGH SENSITIVITY)
Troponin I (High Sensitivity): <2 ng/L (ref <18)
Troponin I (High Sensitivity): <2 ng/L (ref <18)

## 2023-05-11 LAB — BASIC METABOLIC PANEL
Anion gap: 10 (ref 5–15)
BUN: 12 mg/dL (ref 6–20)
CO2: 21 mmol/L — ABNORMAL LOW (ref 22–32)
Calcium: 8.4 mg/dL — ABNORMAL LOW (ref 8.9–10.3)
Chloride: 102 mmol/L (ref 98–111)
Creatinine, Ser: 0.92 mg/dL (ref 0.44–1.00)
GFR, Estimated: >60 mL/min (ref >60)
Glucose, Bld: 147 mg/dL — ABNORMAL HIGH (ref 70–99)
Potassium: 3.5 mmol/L (ref 3.5–5.1)
Sodium: 133 mmol/L — ABNORMAL LOW (ref 135–145)

## 2023-05-11 LAB — APTT: aPTT: 29 s (ref 24–36)

## 2023-05-11 LAB — PROTIME-INR
INR: 1.1 (ref 0.8–1.2)
Prothrombin Time: 14.6 s (ref 11.4–15.2)

## 2023-05-11 MED ORDER — GABAPENTIN 100 MG PO CAPS
100.0000 mg | ORAL_CAPSULE | Freq: Three times a day (TID) | ORAL | Status: DC
  Administered 2023-05-11 – 2023-05-12 (×3): 100 mg via ORAL
  Filled 2023-05-11 (×3): qty 1

## 2023-05-11 MED ORDER — OXYCODONE-ACETAMINOPHEN 5-325 MG PO TABS
2.0000 | ORAL_TABLET | Freq: Once | ORAL | Status: AC
  Administered 2023-05-11: 2 via ORAL
  Filled 2023-05-11: qty 2

## 2023-05-11 MED ORDER — AMITRIPTYLINE HCL 25 MG PO TABS
50.0000 mg | ORAL_TABLET | Freq: Every day | ORAL | Status: DC
  Administered 2023-05-11: 50 mg via ORAL
  Filled 2023-05-11: qty 2

## 2023-05-11 MED ORDER — IOHEXOL 350 MG/ML SOLN
75.0000 mL | Freq: Once | INTRAVENOUS | Status: AC | PRN
  Administered 2023-05-11: 75 mL via INTRAVENOUS

## 2023-05-11 MED ORDER — HEPARIN BOLUS VIA INFUSION
5800.0000 [IU] | Freq: Once | INTRAVENOUS | Status: AC
  Administered 2023-05-11: 5800 [IU] via INTRAVENOUS

## 2023-05-11 MED ORDER — ORAL CARE MOUTH RINSE: 15.0000 mL | OROMUCOSAL | Status: DC | PRN

## 2023-05-11 MED ORDER — ACETAMINOPHEN 325 MG PO TABS
650.0000 mg | ORAL_TABLET | Freq: Four times a day (QID) | ORAL | Status: DC | PRN
  Administered 2023-05-12: 650 mg via ORAL
  Filled 2023-05-11: qty 2

## 2023-05-11 MED ORDER — INFLUENZA VIRUS VACC SPLIT PF (FLUZONE) 0.5 ML IM SUSY: 0.5000 mL | PREFILLED_SYRINGE | INTRAMUSCULAR | Status: DC

## 2023-05-11 MED ORDER — ACETAMINOPHEN 650 MG RE SUPP: 650.0000 mg | Freq: Four times a day (QID) | RECTAL | Status: DC | PRN

## 2023-05-11 MED ORDER — ONDANSETRON HCL 4 MG PO TABS: 4.0000 mg | ORAL_TABLET | Freq: Four times a day (QID) | ORAL | Status: DC | PRN

## 2023-05-11 MED ORDER — HYDROCODONE-ACETAMINOPHEN 7.5-325 MG PO TABS
1.0000 | ORAL_TABLET | Freq: Three times a day (TID) | ORAL | Status: DC | PRN
  Administered 2023-05-11 – 2023-05-12 (×2): 1 via ORAL
  Filled 2023-05-11 (×2): qty 1

## 2023-05-11 MED ORDER — HEPARIN (PORCINE) 25000 UT/250ML-% IV SOLN
1300.0000 [IU]/h | INTRAVENOUS | Status: DC
  Administered 2023-05-11 – 2023-05-12 (×2): 1300 [IU]/h via INTRAVENOUS
  Filled 2023-05-11 (×2): qty 250

## 2023-05-11 MED ORDER — ONDANSETRON HCL 4 MG/2ML IJ SOLN: 4.0000 mg | Freq: Four times a day (QID) | INTRAMUSCULAR | Status: DC | PRN

## 2023-05-11 NOTE — H&P (Signed)
History and Physical    PatientMarland Kitchen [name] ZOX:[id number] DOB: 11/12/1975 DOA: 05/11/2023 DOS: the patient was seen and examined on 05/11/2023 PCP: Elfredia Nevins, MD  Patient coming from: Home  Chief Complaint:  Chief Complaint  Patient presents with   Rib Injury   Flank Pain   HPI: [name] is a 47 y.o. female with medical history significant of hypertension, chronic back pain, fibromyalgia who presents to the emergency department due to 4 days onset of left-sided chest pain.  Patient complained of a persistent pain in the lateral aspect of left chest wall with radiation to anterior and posterior chest wall.  Pain is aggravated with deep inspiration and this worsened last night and pain is alleviated with home pain medications.  She denies any recent fall or injury.  ED Course:  In the emergency department, she was hemodynamically stable.  Workup in the ED showed normocytic anemia, BMP was normal except for sodium of 133, bicarb 21, blood glucose 147.  Urinalysis was positive for proteinuria, troponin x 2 was normal, pregnancy test was negative. CT angiography chest with contrast was positive for acute pulmonary embolism with involvement of the lobar and segmental branches of both lower lobes.  No evidence of right heart strain. Small partially loculated left pleural effusion with compressive atelectasis in the left lower lobe and lingula. Left ribs and chest x-ray showed small left pleural effusion with probable associated compressive atelectatic changes in the left lung lower lobe. No acute displaced rib fracture. IV heparin drip was started.  Percocet was given.  Hospitalist was asked to admit patient for further evaluation and management.    Review of Systems: Review of systems as noted in the HPI. All other systems reviewed and are negative.   Past Medical History:  Diagnosis Date   Anxiety    Chronic pain    Dr. Wynn Banker   Degenerative disc disease    Spinal,  small syrinx   Depression    Esophageal stricture    Status post dilatation   Fibromyalgia    GERD (gastroesophageal reflux disease)    Hiatal hernia    Internal hemorrhoids without mention of complication    Irritable bowel syndrome    Palpitations    No documented arrhythmias   Personal history of colonic polyps 10/04/2000   HYPERPLASTIC POLYP   POTS (postural orthostatic tachycardia syndrome)    Syncope    Negative tilt table test   Syringomyelia and syringobulbia (HCC)    Past Surgical History:  Procedure Laterality Date   CESAREAN SECTION     Left elbow surgery     PARTIAL HYSTERECTOMY     TONSILLECTOMY      Social History:  reports that she quit smoking about 11 years ago. Her smoking use included cigarettes. She has never used smokeless tobacco. She reports that she does not drink alcohol and does not use drugs.   Allergies  Allergen Reactions   Codeine Other (See Comments)    Chest tightness    Family History  Problem Relation Age of Onset   Diabetes type II Mother    Colon polyps Mother    Diverticulitis Mother    Diabetes Mother    Heart disease Mother    Osteoporosis Father    Diabetes type II Brother    Uterine cancer Other    Ovarian cancer Other    COPD Paternal Grandfather    Heart attack Maternal Grandmother      Prior to Admission medications  Medication Sig Start Date End Date Taking? Authorizing Provider  amitriptyline (ELAVIL) 50 MG tablet Take 50 mg by mouth at bedtime.    [provider]  amphetamine-dextroamphetamine (ADDERALL XR) 20 MG 24 hr capsule Take 20 mg by mouth 2 (two) times daily. 10/24/19   [provider]  amphetamine-dextroamphetamine (ADDERALL XR) 20 MG 24 hr capsule Take by mouth. 08/20/15   [provider]  amphetamine-dextroamphetamine (ADDERALL XR) 25 MG 24 hr capsule Take by mouth daily.    [provider]  amphetamine-dextroamphetamine (ADDERALL) 20 MG tablet Take 20 mg by mouth 2 (two)  times daily. 03/27/22   [provider]  butalbital-acetaminophen-caffeine (FIORICET) 50-325-40 MG tablet Take 1 tablet by mouth as needed. 05/20/16   [provider]  clonazePAM (KLONOPIN) 1 MG tablet Take 1 mg by mouth 3 (three) times daily.    [provider]  diazepam (VALIUM) 10 MG tablet Take 10 mg by mouth once. 03/06/22   [provider]  DULoxetine (CYMBALTA) 60 MG capsule Take 60 mg by mouth daily. 02/25/23   [provider]  EMGALITY 120 MG/ML SOAJ INJECT 1 pen INTO THE SKIN every 30 DAYS 08/17/22   Dohmeier, Porfirio Mylar, MD  ergocalciferol (VITAMIN D2) 1.25 MG (50000 UT) capsule Take 50,000 Units by mouth once a week.    [provider]  escitalopram (LEXAPRO) 10 MG tablet Take 10 mg by mouth daily. 10/05/22   [provider]  fluticasone Aleda Grana) 50 MCG/ACT nasal spray  03/21/21   [provider]  gabapentin (NEURONTIN) 100 MG capsule Take 1 capsule (100 mg total) by mouth 4 (four) times daily. 03/01/23   Jones Bales, NP  HYDROcodone-acetaminophen (NORCO) 7.5-325 MG tablet Take 2 tablets by mouth every 8 (eight) hours as needed for moderate pain. 04/22/23   Jones Bales, NP  meloxicam (MOBIC) 7.5 MG tablet Take 7.5 mg by mouth 2 (two) times daily as needed. 12/07/22   [provider]  methocarbamol (ROBAXIN) 500 MG tablet Take 500 mg by mouth every 6 (six) hours as needed for muscle spasms.    [provider]  traZODone (DESYREL) 50 MG tablet Take 50 mg by mouth at bedtime. 09/18/19   [provider]  trimethoprim-polymyxin b (POLYTRIM) ophthalmic solution 1 drop every 6 (six) hours. 12/22/21   [provider]  UBRELVY 100 MG TABS TAKE 1 TABLET BY MOUTH AS NEEDED 06/29/22   Dohmeier, Porfirio Mylar, MD    Physical Exam: BP 104/75   Pulse 91   Temp 98.2 F (36.8 C)   Resp 20   Ht 5\' 7"  (1.702 m)   Wt 75.6 kg   SpO2 97%   BMI 26.10 kg/m   General: 47 y.o. year-old female well developed  well nourished in no acute distress.  Alert and oriented x3. HEENT: NCAT, EOMI Neck: Supple, trachea medial Cardiovascular: Regular rate and rhythm with no rubs or gallops.  No thyromegaly or JVD noted.  No lower extremity edema. 2/4 pulses in all 4 extremities. Respiratory: Clear to auscultation with no wheezes or rales. Good inspiratory effort. Abdomen: Soft, nontender nondistended with normal bowel sounds x4 quadrants. Muskuloskeletal: No cyanosis, clubbing or edema noted bilaterally Neuro: CN II-XII intact, strength 5/5 x 4, sensation, reflexes intact Skin: No ulcerative lesions noted or rashes Psychiatry: Judgement and insight appear normal. Mood is appropriate for condition and setting          Labs on Admission:  Basic Metabolic Panel: Recent Labs  Lab 05/11/23 1320  NA 133*  K 3.5  CL 102  CO2 21*  GLUCOSE 147*  BUN 12  CREATININE 0.92  CALCIUM 8.4*   Liver Function Tests: No results for input(s): "AST", "ALT", "ALKPHOS", "BILITOT", "PROT", "ALBUMIN" in the last 168 hours. No results for input(s): "LIPASE", "AMYLASE" in the last 168 hours. No results for input(s): "AMMONIA" in the last 168 hours. CBC: Recent Labs  Lab 05/11/23 1320  WBC 7.8  HGB 9.9*  HCT 31.2*  MCV 100.0  PLT 246   Cardiac Enzymes: No results for input(s): "CKTOTAL", "CKMB", "CKMBINDEX", "TROPONINI" in the last 168 hours.  BNP (last 3 results) No results for input(s): "BNP" in the last 8760 hours.  ProBNP (last 3 results) No results for input(s): "PROBNP" in the last 8760 hours.  CBG: No results for input(s): "GLUCAP" in the last 168 hours.  Radiological Exams on Admission: CT Angio Chest PE W and/or Wo Contrast  Result Date: 05/11/2023 CLINICAL DATA:  Left-sided chest pain radiating into the shoulder and left breast for 4 days. No known injury. Pulmonary embolism suspected, high probability. EXAM: CT ANGIOGRAPHY CHEST WITH CONTRAST TECHNIQUE: Multidetector CT imaging of the chest was  performed using the standard protocol during bolus administration of intravenous contrast. Multiplanar CT image reconstructions and MIPs were obtained to evaluate the vascular anatomy. RADIATION DOSE REDUCTION: This exam was performed according to the departmental dose-optimization program which includes automated exposure control, adjustment of the mA and/or kV according to patient size and/or use of iterative reconstruction technique. CONTRAST:  75mL OMNIPAQUE IOHEXOL 350 MG/ML SOLN COMPARISON:  Chest radiographs 05/11/2023 and 04/17/2015. No prior relevant chest CT. FINDINGS: Cardiovascular: The pulmonary arteries are well opacified with contrast to the level of the subsegmental branches. Study is positive for acute pulmonary embolism with involvement of the lobar and segmental branches of both lower lobes. The main pulmonary arteries are patent. No evidence of right heart strain. No acute systemic arterial abnormalities are identified. The heart size is normal. There is no pericardial effusion. Mediastinum/Nodes: There are no enlarged mediastinal, hilar or axillary lymph nodes. The thyroid gland, trachea and esophagus demonstrate no significant findings. Lungs/Pleura: Small left pleural effusion appears partially loculated with extension of fluid into the major fissure. No right pleural effusion. No pneumothorax. There is compressive atelectasis in the left lower lobe and lingula. Minimal dependent atelectasis in the right lung. There are scattered small subpleural nodules bilaterally, most consistent with incidental lymph nodes; no specific follow-up imaging recommended. No suspicious pulmonary nodules. Upper abdomen:  The visualized upper abdomen is unremarkable. Musculoskeletal/Chest wall: There is no chest wall mass or suspicious osseous finding. Unless specific follow-up recommendations are mentioned in the findings or impression sections, no imaging follow-up of any mentioned incidental findings is  recommended. Review of the MIP images confirms the above findings. IMPRESSION: 1. Study is positive for acute pulmonary embolism with involvement of the lobar and segmental branches of both lower lobes. No evidence of right heart strain. 2. Small partially loculated left pleural effusion with compressive atelectasis in the left lower lobe and lingula. 3. Critical Value/emergent results were called by telephone at the time of interpretation on 05/11/2023 at 6:21 pm to provider Gloris Manchester , who verbally acknowledged these results. Electronically Signed   By: Carey Bullocks M.D.   On: 05/11/2023 18:21   DG Ribs Unilateral W/Chest Left  Result Date: 05/11/2023 CLINICAL DATA:  Left rib pain. EXAM: LEFT RIBS AND CHEST - 3+ VIEW COMPARISON:  04/17/2015. FINDINGS: There is small left pleural  effusion with probable associated compressive atelectatic changes in the left lung lower lobe. Bilateral lung fields are otherwise clear. Right costophrenic angle is clear. Normal cardio-mediastinal silhouette. No acute osseous abnormalities. No acute displaced rib fracture.  No focal rib lesion. The soft tissues are within normal limits. IMPRESSION: 1. Small left pleural effusion with probable associated compressive atelectatic changes in the left lung lower lobe. 2. No acute displaced rib fracture. Electronically Signed   By: Jules Schick M.D.   On: 05/11/2023 15:06    EKG: I independently viewed the EKG done and my findings are as followed: Sinus tachycardia at a rate of 103 bpm  Assessment/Plan Present on Admission:  Pulmonary embolism (HCC)  Fibromyalgia  Principal Problem:   Pulmonary embolism (HCC) Active Problems:   Fibromyalgia   Atelectasis   Chronic back pain  Acute pulmonary embolism Patient presented with left-sided chest pain CT angiography chest with contrast was positive for acute pulmonary embolism  Patient was started on IV heparin drip with plan to transition to DOAC in the morning Bilateral  lower extremity ultrasound will be done in the morning Echocardiogram will be done in the morning  Left lower lobe compressive atelectasis CT angiography chest with contrast showed small partially loculated left pleural effusion with compressive atelectasis in the left lower lobe and lingula. Continue incentive spirometry ****  Fibromyalgia Continue Elavil, Gabapentin  Chronic back pain Continue Norco  DVT prophylaxis: Heparin drip  Advance Care Planning: CODE STATUS: Full code  Consults: None  Family Communication: None at bedside  Severity of Illness: The appropriate patient status for this patient is OBSERVATION. Observation status is judged to be reasonable and necessary in order to provide the required intensity of service to ensure the patient's safety. The patient's presenting symptoms, physical exam findings, and initial radiographic and laboratory data in the context of their medical condition is felt to place them at decreased risk for further clinical deterioration. Furthermore, it is anticipated that the patient will be medically stable for discharge from the hospital within 2 midnights of admission.   Author: Frankey Shown, DO 05/11/2023 8:54 PM  For on call review [email or web address].

## 2023-05-11 NOTE — ED Provider Notes (Signed)
[place] EMERGENCY DEPARTMENT AT [facility] Provider Note   CSN: [id number] Arrival date & time: 05/11/23  1248     History  Chief Complaint  Patient presents with   Rib Injury   Flank Pain    [name] is a 47 y.o. female.   Flank Pain Associated symptoms include chest pain.  Patient presenting for left-sided chest pain.  Medical history includes anxiety, depression, fibromyalgia.  She takes Norco for chronic pain.  4 days ago, she noticed some pain on the lateral aspect of her left chest wall.  Since then, pain has been persistent and has migrated to anterior and posterior chest wall.  Pain is worse with deep inspiration.  She denies any recent injuries.  Pain worsened last night.  Today it has diminished after she took her home pain medications.      Home Medications Prior to Admission medications   Medication Sig Start Date End Date Taking? Authorizing Provider  amitriptyline (ELAVIL) 50 MG tablet Take 50 mg by mouth at bedtime.    [provider]  amphetamine-dextroamphetamine (ADDERALL XR) 20 MG 24 hr capsule Take 20 mg by mouth 2 (two) times daily. 10/24/19   [provider]  amphetamine-dextroamphetamine (ADDERALL XR) 20 MG 24 hr capsule Take by mouth. 08/20/15   [provider]  amphetamine-dextroamphetamine (ADDERALL XR) 25 MG 24 hr capsule Take by mouth daily.    [provider]  amphetamine-dextroamphetamine (ADDERALL) 20 MG tablet Take 20 mg by mouth 2 (two) times daily. 03/27/22   [provider]  butalbital-acetaminophen-caffeine (FIORICET) 50-325-40 MG tablet Take 1 tablet by mouth as needed. 05/20/16   [provider]  clonazePAM (KLONOPIN) 1 MG tablet Take 1 mg by mouth 3 (three) times daily.    [provider]  diazepam (VALIUM) 10 MG tablet Take 10 mg by mouth once. 03/06/22   [provider]  DULoxetine (CYMBALTA) 60 MG capsule Take 60 mg by mouth daily. 02/25/23   [provider]  EMGALITY 120 MG/ML SOAJ INJECT 1 pen INTO THE SKIN every 30 DAYS 08/17/22   Dohmeier, Porfirio Mylar, MD  ergocalciferol (VITAMIN D2) 1.25 MG (50000 UT) capsule Take 50,000 Units by mouth once a week.    [provider]  escitalopram (LEXAPRO) 10 MG tablet Take 10 mg by mouth daily. 10/05/22   [provider]  fluticasone Aleda Grana) 50 MCG/ACT nasal spray  03/21/21   [provider]  gabapentin (NEURONTIN) 100 MG capsule Take 1 capsule (100 mg total) by mouth 4 (four) times daily. 03/01/23   Jones Bales, NP  HYDROcodone-acetaminophen (NORCO) 7.5-325 MG tablet Take 2 tablets by mouth every 8 (eight) hours as needed for moderate pain. 04/22/23   Jones Bales, NP  meloxicam (MOBIC) 7.5 MG tablet Take 7.5 mg by mouth 2 (two) times daily as needed. 12/07/22   [provider]  methocarbamol (ROBAXIN) 500 MG tablet Take 500 mg by mouth every 6 (six) hours as needed for muscle spasms.    [provider]  traZODone (DESYREL) 50 MG tablet Take 50 mg by mouth at bedtime. 09/18/19   [provider]  trimethoprim-polymyxin b (POLYTRIM) ophthalmic solution 1 drop every 6 (six) hours. 12/22/21   [provider]  UBRELVY 100 MG TABS TAKE 1 TABLET BY MOUTH AS NEEDED 06/29/22   Dohmeier, Porfirio Mylar, MD      Allergies    Codeine    Review of Systems   Review of Systems  Cardiovascular:  Positive for chest pain.  All other systems reviewed and are negative.   Physical Exam Updated Vital Signs BP 105/74 (BP Location: Right Arm)   Pulse 98   Temp 97.9 F (36.6 C) (Oral)   Resp 18   Ht 5\' 7"  (1.702 m)   Wt 73 kg   SpO2 95%   BMI 25.21 kg/m  Physical Exam Vitals and nursing note reviewed.  Constitutional:      General: She is not in acute distress.    Appearance: Normal appearance. She is well-developed. She is not ill-appearing, toxic-appearing or diaphoretic.  HENT:     Head: Normocephalic and atraumatic.     Right Ear: External ear  normal.     Left Ear: External ear normal.     Nose: Nose normal.     Mouth/Throat:     Mouth: Mucous membranes are moist.  Eyes:     Extraocular Movements: Extraocular movements intact.     Conjunctiva/sclera: Conjunctivae normal.  Cardiovascular:     Rate and Rhythm: Normal rate and regular rhythm.  Pulmonary:     Effort: Pulmonary effort is normal. No respiratory distress.  Chest:     Chest wall: Tenderness present.  Abdominal:     General: There is no distension.     Palpations: Abdomen is soft.     Tenderness: There is no abdominal tenderness.  Musculoskeletal:        General: No swelling. Normal range of motion.     Cervical back: Normal range of motion and neck supple.     Right lower leg: No edema.     Left lower leg: No edema.  Skin:    General: Skin is warm and dry.     Coloration: Skin is not jaundiced or pale.  Neurological:     General: No focal deficit present.     Mental Status: She is alert and oriented to person, place, and time.  Psychiatric:        Mood and Affect: Mood normal.        Behavior: Behavior normal.     ED Results / Procedures / Treatments   Labs (all labs ordered are listed, but only abnormal results are displayed) Labs Reviewed  BASIC METABOLIC PANEL - Abnormal; Notable for the following components:      Result Value   Sodium 133 (*)    CO2 21 (*)    Glucose, Bld 147 (*)    Calcium 8.4 (*)    All other components within normal limits  CBC - Abnormal; Notable for the following components:   RBC 3.12 (*)    Hemoglobin 9.9 (*)    HCT 31.2 (*)    All other components within normal limits  URINALYSIS, ROUTINE W REFLEX MICROSCOPIC - Abnormal; Notable for the following components:   APPearance HAZY (*)    Hgb urine dipstick MODERATE (*)    Protein, ur 100 (*)    Bacteria, UA RARE (*)    All other components within normal limits  PREGNANCY, URINE  TROPONIN I (HIGH SENSITIVITY)  TROPONIN I (HIGH SENSITIVITY)    EKG EKG  Interpretation Date/Time:  Tuesday May 11 2023 13:08:07 EDT Ventricular Rate:  103 PR Interval:  132 QRS Duration:  82 QT Interval:  326 QTC Calculation: 427 R Axis:   70  Text Interpretation: Sinus tachycardia Confirmed by Gloris Manchester [phone]) on 05/11/2023 5:44:15 PM  Radiology CT Angio Chest PE W and/or Wo Contrast  Result Date: 05/11/2023 CLINICAL DATA:  Left-sided  chest pain radiating into the shoulder and left breast for 4 days. No known injury. Pulmonary embolism suspected, high probability. EXAM: CT ANGIOGRAPHY CHEST WITH CONTRAST TECHNIQUE: Multidetector CT imaging of the chest was performed using the standard protocol during bolus administration of intravenous contrast. Multiplanar CT image reconstructions and MIPs were obtained to evaluate the vascular anatomy. RADIATION DOSE REDUCTION: This exam was performed according to the departmental dose-optimization program which includes automated exposure control, adjustment of the mA and/or kV according to patient size and/or use of iterative reconstruction technique. CONTRAST:  75mL OMNIPAQUE IOHEXOL 350 MG/ML SOLN COMPARISON:  Chest radiographs 05/11/2023 and 04/17/2015. No prior relevant chest CT. FINDINGS: Cardiovascular: The pulmonary arteries are well opacified with contrast to the level of the subsegmental branches. Study is positive for acute pulmonary embolism with involvement of the lobar and segmental branches of both lower lobes. The main pulmonary arteries are patent. No evidence of right heart strain. No acute systemic arterial abnormalities are identified. The heart size is normal. There is no pericardial effusion. Mediastinum/Nodes: There are no enlarged mediastinal, hilar or axillary lymph nodes. The thyroid gland, trachea and esophagus demonstrate no significant findings. Lungs/Pleura: Small left pleural effusion appears partially loculated with extension of fluid into the major fissure. No right pleural effusion. No  pneumothorax. There is compressive atelectasis in the left lower lobe and lingula. Minimal dependent atelectasis in the right lung. There are scattered small subpleural nodules bilaterally, most consistent with incidental lymph nodes; no specific follow-up imaging recommended. No suspicious pulmonary nodules. Upper abdomen:  The visualized upper abdomen is unremarkable. Musculoskeletal/Chest wall: There is no chest wall mass or suspicious osseous finding. Unless specific follow-up recommendations are mentioned in the findings or impression sections, no imaging follow-up of any mentioned incidental findings is recommended. Review of the MIP images confirms the above findings. IMPRESSION: 1. Study is positive for acute pulmonary embolism with involvement of the lobar and segmental branches of both lower lobes. No evidence of right heart strain. 2. Small partially loculated left pleural effusion with compressive atelectasis in the left lower lobe and lingula. 3. Critical Value/emergent results were called by telephone at the time of interpretation on 05/11/2023 at 6:21 pm to provider Gloris Manchester , who verbally acknowledged these results. Electronically Signed   By: Carey Bullocks M.D.   On: 05/11/2023 18:21   DG Ribs Unilateral W/Chest Left  Result Date: 05/11/2023 CLINICAL DATA:  Left rib pain. EXAM: LEFT RIBS AND CHEST - 3+ VIEW COMPARISON:  04/17/2015. FINDINGS: There is small left pleural effusion with probable associated compressive atelectatic changes in the left lung lower lobe. Bilateral lung fields are otherwise clear. Right costophrenic angle is clear. Normal cardio-mediastinal silhouette. No acute osseous abnormalities. No acute displaced rib fracture.  No focal rib lesion. The soft tissues are within normal limits. IMPRESSION: 1. Small left pleural effusion with probable associated compressive atelectatic changes in the left lung lower lobe. 2. No acute displaced rib fracture. Electronically Signed   By:  Jules Schick M.D.   On: 05/11/2023 15:06    Procedures Procedures    Medications Ordered in ED Medications  iohexol (OMNIPAQUE) 350 MG/ML injection 75 mL (75 mLs Intravenous Contrast Given 05/11/23 1804)  oxyCODONE-acetaminophen (PERCOCET/ROXICET) 5-325 MG per tablet 2 tablet (2 tablets Oral Given 05/11/23 1831)    ED Course/ Medical Decision Making/ A&P  Medical Decision Making Amount and/or Complexity of Data Reviewed Labs: ordered. Radiology: ordered.  Risk Prescription drug management.   This patient presents to the ED for concern of chest pain, this involves an extensive number of treatment options, and is a complaint that carries with it a high risk of complications and morbidity.  The differential diagnosis includes PE, pneumonia, muscle strain, anxiety   Co morbidities that complicate the patient evaluation  anxiety, depression, fibromyalgia   Additional history obtained:  Additional history obtained from N/A External records from outside source obtained and reviewed including EMR   Lab Tests:  I Ordered, and personally interpreted labs.  The pertinent results include: Anemia panel Christy, no leukocytosis, normal kidney function, normal troponin   Imaging Studies ordered:  I ordered imaging studies including CTA chest I independently visualized and interpreted imaging which showed bilateral lower lobe PEs; small partially loculated left pleural effusion I agree with the radiologist interpretation   Cardiac Monitoring: / EKG:  The patient was maintained on a cardiac monitor.  I personally viewed and interpreted the cardiac monitored which showed an underlying rhythm of: Sinus rhythm   Problem List / ED Course / Critical interventions / Medication management  Patient presents for 4 days of worsening left chest wall pain.  On arrival, vital signs are normal.  Patient is well-appearing on exam.  She describes a pain  throughout the lateral aspect of her left chest wall that radiates to the anterior chest and thoracic back as well.  She does have some mild tenderness.  She denies recent injuries.  Lab work shows no leukocytosis and normal troponin.  She has anemia of unknown tenacity.  Chest x-ray shows of small left pleural effusion.  CTA of chest was ordered for further evaluation.  CTA was positive for bilateral PEs of lobar and segmental branches of lower lobes.  There was no evidence of heart strain.  Heparin was initiated.  Percocet was ordered for ongoing analgesia.  Patient was admitted for further management. I ordered medication including Percocet for analgesia; heparin for PEs Reevaluation of the patient after these medicines showed that the patient improved I have reviewed the patients home medicines and have made adjustments as needed   Social Determinants of Health:  Has PCP  CRITICAL CARE Performed by: Gloris Manchester   Total critical care time: 32 minutes  Critical care time was exclusive of separately billable procedures and treating other patients.  Critical care was necessary to treat or prevent imminent or life-threatening deterioration.  Critical care was time spent personally by me on the following activities: development of treatment plan with patient and/or surrogate as well as nursing, discussions with consultants, evaluation of patient's response to treatment, examination of patient, obtaining history from patient or surrogate, ordering and performing treatments and interventions, ordering and review of laboratory studies, ordering and review of radiographic studies, pulse oximetry and re-evaluation of patient's condition.          Final Clinical Impression(s) / ED Diagnoses Final diagnoses:  Acute pulmonary embolism without acute cor pulmonale, unspecified pulmonary embolism type [facility])    Rx / DC Orders ED Discharge Orders     None         Gloris Manchester, MD 05/11/23  1837

## 2023-05-11 NOTE — Consult Note (Signed)
PHARMACY - ANTICOAGULATION CONSULT NOTE  Pharmacy Consult for heparin Indication: pulmonary embolus  Allergies  Allergen Reactions   Codeine Other (See Comments)    Chest tightness    Patient Measurements: Height: 5\' 7"  (170.2 cm) Weight: 73 kg (160 lb 15 oz) IBW/kg (Calculated) : 61.6 Heparin Dosing Weight: 73 kg  Vital Signs: Temp: 97.9 F (36.6 C) (10/08 1730) Temp Source: Oral (10/08 1730) BP: 105/74 (10/08 1730) Pulse Rate: 98 (10/08 1730)  Labs: Recent Labs    05/11/23 1320 05/11/23 1532  HGB 9.9*  --   HCT 31.2*  --   PLT 246  --   CREATININE 0.92  --   TROPONINIHS <2 <2    Estimated Creatinine Clearance: 71.9 mL/min (by C-G formula based on SCr of 0.92 mg/dL).   Medical History: Past Medical History:  Diagnosis Date   Anxiety    Chronic pain    Dr. Wynn Banker   Degenerative disc disease    Spinal, small syrinx   Depression    Esophageal stricture    Status post dilatation   Fibromyalgia    GERD (gastroesophageal reflux disease)    Hiatal hernia    Internal hemorrhoids without mention of complication    Irritable bowel syndrome    Palpitations    No documented arrhythmias   Personal history of colonic polyps 10/04/2000   HYPERPLASTIC POLYP   POTS (postural orthostatic tachycardia syndrome)    Syncope    Negative tilt table test   Syringomyelia and syringobulbia (HCC)     Medications:  Not on any anticoagulants   Assessment: [name] is a 47 yo F presenting with 4 day history of left chest wall pain with pain that radiates to the left shoulder. No history of recent injury. 10/8 CTA scan was positive for PE.Troponin <2, no evidence of left heart strain.   Goal of Therapy:  Heparin level 0.3-0.7 units/ml Monitor platelets by anticoagulation protocol: Yes   Plan:  Give 5800 units bolus x 1 Start heparin infusion at 1300 units/hr Check anti-Xa level in 6 hours and daily while on heparin Continue to monitor H&H and platelets  Effie Shy PGY1 Pharmacy Resident 05/11/2023,6:52 PM

## 2023-05-11 NOTE — ED Triage Notes (Signed)
Pt complains of pain on left side that radiates to to left shoulder and around left breast x 4 days. Pt denies any injury. Pain worse with movement and touch.

## 2023-05-11 NOTE — ED Notes (Signed)
ED TO INPATIENT HANDOFF REPORT  ED Nurse Name and Phone #: Efraim Kaufmann, RN  S Name/Age/Gender [name] 47 y.o. female Room/Bed: APA07/APA07  Code Status   Code Status: Not on file  Home/SNF/Other Home Patient oriented to: self, place, time, and situation Is this baseline? Yes   Triage Complete: Triage complete  Chief Complaint Pulmonary embolism [facility]) [I26.99]  Triage Note Pt complains of pain on left side that radiates to to left shoulder and around left breast x 4 days. Pt denies any injury. Pain worse with movement and touch.    Allergies Allergies  Allergen Reactions   Codeine Other (See Comments)    Chest tightness    Level of Care/Admitting Diagnosis ED Disposition     ED Disposition  Admit   Condition  --   Comment  [facility] [[id number]]  Level of Care: Telemetry [5]  Covid Evaluation: Asymptomatic - no recent exposure (last 10 days) testing not required  Diagnosis: Pulmonary embolism [facility]) [[id number]]  Admitting Physician: Frankey Shown [[id number]]  Attending Physician: Frankey Shown [[id number]]          B Medical/Surgery History Past Medical History:  Diagnosis Date   Anxiety    Chronic pain    Dr. Wynn Banker   Degenerative disc disease    Spinal, small syrinx   Depression    Esophageal stricture    Status post dilatation   Fibromyalgia    GERD (gastroesophageal reflux disease)    Hiatal hernia    Internal hemorrhoids without mention of complication    Irritable bowel syndrome    Palpitations    No documented arrhythmias   Personal history of colonic polyps 10/04/2000   HYPERPLASTIC POLYP   POTS (postural orthostatic tachycardia syndrome)    Syncope    Negative tilt table test   Syringomyelia and syringobulbia (HCC)    Past Surgical History:  Procedure Laterality Date   CESAREAN SECTION     Left elbow surgery     PARTIAL HYSTERECTOMY     TONSILLECTOMY       A IV Location/Drains/Wounds Patient  Lines/Drains/Airways Status     Active Line/Drains/Airways     Name Placement date Placement time Site Days   Peripheral IV 05/11/23 20 G Right Antecubital 05/11/23  1707  Antecubital  less than 1            Intake/Output Last 24 hours No intake or output data in the 24 hours ending 05/11/23 2022  Labs/Imaging Results for orders placed or performed during the hospital encounter of 05/11/23 (from the past 48 hour(s))  Basic metabolic panel     Status: Abnormal   Collection Time: 05/11/23  1:20 PM  Result Value Ref Range   Sodium 133 (L) 135 - 145 mmol/L   Potassium 3.5 3.5 - 5.1 mmol/L   Chloride 102 98 - 111 mmol/L   CO2 21 (L) 22 - 32 mmol/L   Glucose, Bld 147 (H) 70 - 99 mg/dL    Comment: Glucose reference range applies only to samples taken after fasting for at least 8 hours.   BUN 12 6 - 20 mg/dL   Creatinine, Ser 1.91 0.44 - 1.00 mg/dL   Calcium 8.4 (L) 8.9 - 10.3 mg/dL   GFR, Estimated >47 >82 mL/min    Comment: (NOTE) Calculated using the CKD-EPI Creatinine Equation (2021)    Anion gap 10 5 - 15    Comment: Performed at [facility], [address], [place], [place] [address]  CBC  Status: Abnormal   Collection Time: 05/11/23  1:20 PM  Result Value Ref Range   WBC 7.8 4.0 - 10.5 K/uL   RBC 3.12 (L) 3.87 - 5.11 MIL/uL   Hemoglobin 9.9 (L) 12.0 - 15.0 g/dL   HCT 47.8 (L) 29.5 - 62.1 %   MCV 100.0 80.0 - 100.0 fL   MCH 31.7 26.0 - 34.0 pg   MCHC 31.7 30.0 - 36.0 g/dL   RDW 30.8 65.7 - 84.6 %   Platelets 246 150 - 400 K/uL   nRBC 0.0 0.0 - 0.2 %    Comment: Performed at [facility], [address] [place], [place] [address]  Troponin I (High Sensitivity)     Status: None   Collection Time: 05/11/23  1:20 PM  Result Value Ref Range   Troponin I (High Sensitivity) <2 <18 ng/L    Comment: (NOTE) Elevated high sensitivity troponin I (hsTnI) values and significant  changes across serial measurements may suggest ACS but many other  chronic and acute conditions  are known to elevate hsTnI results.  Refer to the "Links" section for chest pain algorithms and additional  guidance. Performed at [facility], [address], [place], [place] [address]   Urinalysis, Routine w reflex microscopic -Urine, Clean Catch     Status: Abnormal   Collection Time: 05/11/23  2:22 PM  Result Value Ref Range   Color, Urine YELLOW YELLOW   APPearance HAZY (A) CLEAR   Specific Gravity, Urine 1.028 1.005 - 1.030   pH 5.0 5.0 - 8.0   Glucose, UA NEGATIVE NEGATIVE mg/dL   Hgb urine dipstick MODERATE (A) NEGATIVE   Bilirubin Urine NEGATIVE NEGATIVE   Ketones, ur NEGATIVE NEGATIVE mg/dL   Protein, ur 244 (A) NEGATIVE mg/dL   Nitrite NEGATIVE NEGATIVE   Leukocytes,Ua NEGATIVE NEGATIVE   RBC / HPF 6-10 0 - 5 RBC/hpf   WBC, UA 0-5 0 - 5 WBC/hpf   Bacteria, UA RARE (A) NONE SEEN   Squamous Epithelial / HPF 0-5 0 - 5 /HPF   Mucus PRESENT    Hyaline Casts, UA PRESENT     Comment: Performed at [facility], [address] [place], [place] [address]  Pregnancy, urine     Status: None   Collection Time: 05/11/23  2:22 PM  Result Value Ref Range   Preg Test, Ur NEGATIVE NEGATIVE    Comment:        THE SENSITIVITY OF THIS METHODOLOGY IS >25 mIU/mL. Performed at [facility], [address], [place], [place] [address]   Troponin I (High Sensitivity)     Status: None   Collection Time: 05/11/23  3:32 PM  Result Value Ref Range   Troponin I (High Sensitivity) <2 <18 ng/L    Comment: (NOTE) Elevated high sensitivity troponin I (hsTnI) values and significant  changes across serial measurements may suggest ACS but many other  chronic and acute conditions are known to elevate hsTnI results.  Refer to the "Links" section for chest pain algorithms and additional  guidance. Performed at [facility], [address] [place], [place] [address]   Protime-INR     Status: None   Collection Time: 05/11/23  7:13 PM  Result Value Ref Range   Prothrombin Time 14.6 11.4 - 15.2  seconds   INR 1.1 0.8 - 1.2    Comment: (NOTE) INR goal varies based on device and disease states. Performed at [facility], [address], [place], [place] [address]   APTT  Status: None   Collection Time: 05/11/23  7:13 PM  Result Value Ref Range   aPTT 29 24 - 36 seconds    Comment: Performed at [facility], [address] [place], [place] [address]   CT Angio Chest PE W and/or Wo Contrast  Result Date: 05/11/2023 CLINICAL DATA:  Left-sided chest pain radiating into the shoulder and left breast for 4 days. No known injury. Pulmonary embolism suspected, high probability. EXAM: CT ANGIOGRAPHY CHEST WITH CONTRAST TECHNIQUE: Multidetector CT imaging of the chest was performed using the standard protocol during bolus administration of intravenous contrast. Multiplanar CT image reconstructions and MIPs were obtained to evaluate the vascular anatomy. RADIATION DOSE REDUCTION: This exam was performed according to the departmental dose-optimization program which includes automated exposure control, adjustment of the mA and/or kV according to patient size and/or use of iterative reconstruction technique. CONTRAST:  75mL OMNIPAQUE IOHEXOL 350 MG/ML SOLN COMPARISON:  Chest radiographs 05/11/2023 and 04/17/2015. No prior relevant chest CT. FINDINGS: Cardiovascular: The pulmonary arteries are well opacified with contrast to the level of the subsegmental branches. Study is positive for acute pulmonary embolism with involvement of the lobar and segmental branches of both lower lobes. The main pulmonary arteries are patent. No evidence of right heart strain. No acute systemic arterial abnormalities are identified. The heart size is normal. There is no pericardial effusion. Mediastinum/Nodes: There are no enlarged mediastinal, hilar or axillary lymph nodes. The thyroid gland, trachea and esophagus demonstrate no significant findings. Lungs/Pleura: Small left pleural effusion appears partially loculated with  extension of fluid into the major fissure. No right pleural effusion. No pneumothorax. There is compressive atelectasis in the left lower lobe and lingula. Minimal dependent atelectasis in the right lung. There are scattered small subpleural nodules bilaterally, most consistent with incidental lymph nodes; no specific follow-up imaging recommended. No suspicious pulmonary nodules. Upper abdomen:  The visualized upper abdomen is unremarkable. Musculoskeletal/Chest wall: There is no chest wall mass or suspicious osseous finding. Unless specific follow-up recommendations are mentioned in the findings or impression sections, no imaging follow-up of any mentioned incidental findings is recommended. Review of the MIP images confirms the above findings. IMPRESSION: 1. Study is positive for acute pulmonary embolism with involvement of the lobar and segmental branches of both lower lobes. No evidence of right heart strain. 2. Small partially loculated left pleural effusion with compressive atelectasis in the left lower lobe and lingula. 3. Critical Value/emergent results were called by telephone at the time of interpretation on 05/11/2023 at 6:21 pm to provider Gloris Manchester , who verbally acknowledged these results. Electronically Signed   By: Carey Bullocks M.D.   On: 05/11/2023 18:21   DG Ribs Unilateral W/Chest Left  Result Date: 05/11/2023 CLINICAL DATA:  Left rib pain. EXAM: LEFT RIBS AND CHEST - 3+ VIEW COMPARISON:  04/17/2015. FINDINGS: There is small left pleural effusion with probable associated compressive atelectatic changes in the left lung lower lobe. Bilateral lung fields are otherwise clear. Right costophrenic angle is clear. Normal cardio-mediastinal silhouette. No acute osseous abnormalities. No acute displaced rib fracture.  No focal rib lesion. The soft tissues are within normal limits. IMPRESSION: 1. Small left pleural effusion with probable associated compressive atelectatic changes in the left lung  lower lobe. 2. No acute displaced rib fracture. Electronically Signed   By: Jules Schick M.D.   On: 05/11/2023 15:06    Pending Labs Unresulted Labs (From admission, onward)     Start     Ordered   05/12/23 0500  CBC  Tomorrow morning,   R        05/11/23 1902   05/12/23 0200  Heparin level (unfractionated)  Once-Timed,   URGENT        05/11/23 1902            Vitals/Pain Today's Vitals   05/11/23 1947 05/11/23 2008 05/11/23 2015 05/11/23 2021  BP:  90/66 95/69   Pulse:  91 89 93  Resp:   (!) 21 13  Temp:      TempSrc:      SpO2:  93% 98% 96%  Weight:      Height:      PainSc: 5        Isolation Precautions No active isolations  Medications Medications  heparin bolus via infusion 5,800 Units (5,800 Units Intravenous Bolus from Bag 05/11/23 1915)    Followed by  heparin ADULT infusion 100 units/mL (25000 units/261mL) (1,300 Units/hr Intravenous New Bag/Given 05/11/23 1915)  iohexol (OMNIPAQUE) 350 MG/ML injection 75 mL (75 mLs Intravenous Contrast Given 05/11/23 1804)  oxyCODONE-acetaminophen (PERCOCET/ROXICET) 5-325 MG per tablet 2 tablet (2 tablets Oral Given 05/11/23 1831)    Mobility walks     Focused Assessments Pulmonary Assessment Handoff:  Lung sounds:   O2 Device: Room Air      R Recommendations: See Admitting Provider Note  Report given to:   Additional Notes: .

## 2023-05-12 ENCOUNTER — Observation Stay (HOSPITAL_COMMUNITY): Payer: Medicare Other

## 2023-05-12 ENCOUNTER — Other Ambulatory Visit (HOSPITAL_COMMUNITY): Payer: Self-pay

## 2023-05-12 ENCOUNTER — Observation Stay (HOSPITAL_BASED_OUTPATIENT_CLINIC_OR_DEPARTMENT_OTHER): Payer: Medicare Other

## 2023-05-12 DIAGNOSIS — I2693 Single subsegmental pulmonary embolism without acute cor pulmonale: Secondary | ICD-10-CM | POA: Diagnosis not present

## 2023-05-12 DIAGNOSIS — M549 Dorsalgia, unspecified: Secondary | ICD-10-CM

## 2023-05-12 DIAGNOSIS — M797 Fibromyalgia: Secondary | ICD-10-CM | POA: Diagnosis not present

## 2023-05-12 DIAGNOSIS — G8929 Other chronic pain: Secondary | ICD-10-CM | POA: Diagnosis not present

## 2023-05-12 DIAGNOSIS — I2699 Other pulmonary embolism without acute cor pulmonale: Secondary | ICD-10-CM | POA: Diagnosis not present

## 2023-05-12 LAB — COMPREHENSIVE METABOLIC PANEL
ALT: 9 U/L (ref 0–44)
AST: 11 U/L — ABNORMAL LOW (ref 15–41)
Albumin: 3 g/dL — ABNORMAL LOW (ref 3.5–5.0)
Alkaline Phosphatase: 52 U/L (ref 38–126)
Anion gap: 7 (ref 5–15)
BUN: 10 mg/dL (ref 6–20)
CO2: 25 mmol/L (ref 22–32)
Calcium: 8.1 mg/dL — ABNORMAL LOW (ref 8.9–10.3)
Chloride: 104 mmol/L (ref 98–111)
Creatinine, Ser: 0.78 mg/dL (ref 0.44–1.00)
GFR, Estimated: >60 mL/min (ref >60)
Glucose, Bld: 115 mg/dL — ABNORMAL HIGH (ref 70–99)
Potassium: 3.8 mmol/L (ref 3.5–5.1)
Sodium: 136 mmol/L (ref 135–145)
Total Bilirubin: 0.2 mg/dL — ABNORMAL LOW (ref 0.3–1.2)
Total Protein: 7 g/dL (ref 6.5–8.1)

## 2023-05-12 LAB — ECHOCARDIOGRAM COMPLETE
AR max vel: 2.31 cm2
AV Area VTI: 2.33 cm2
AV Area mean vel: 2.39 cm2
AV Mean grad: 5 mm[Hg]
AV Peak grad: 10.1 mm[Hg]
Ao pk vel: 1.59 m/s
Area-P 1/2: 4.23 cm2
Height: 67 in
MV VTI: 3.84 cm2
S' Lateral: 2.9 cm
Weight: 2666.68 [oz_av]

## 2023-05-12 LAB — CBC
HCT: 28.9 % — ABNORMAL LOW (ref 36.0–46.0)
Hemoglobin: 9.4 g/dL — ABNORMAL LOW (ref 12.0–15.0)
MCH: 32.1 pg (ref 26.0–34.0)
MCHC: 32.5 g/dL (ref 30.0–36.0)
MCV: 98.6 fL (ref 80.0–100.0)
Platelets: 238 10*3/uL (ref 150–400)
RBC: 2.93 MIL/uL — ABNORMAL LOW (ref 3.87–5.11)
RDW: 12.5 % (ref 11.5–15.5)
WBC: 5.7 10*3/uL (ref 4.0–10.5)
nRBC: 0 % (ref 0.0–0.2)

## 2023-05-12 LAB — HIV ANTIBODY (ROUTINE TESTING W REFLEX): HIV Screen 4th Generation wRfx: NONREACTIVE

## 2023-05-12 LAB — MAGNESIUM: Magnesium: 2.2 mg/dL (ref 1.7–2.4)

## 2023-05-12 LAB — PHOSPHORUS: Phosphorus: 3.1 mg/dL (ref 2.5–4.6)

## 2023-05-12 LAB — HEPARIN LEVEL (UNFRACTIONATED): Heparin Unfractionated: 0.35 [IU]/mL (ref 0.30–0.70)

## 2023-05-12 MED ORDER — KETOROLAC TROMETHAMINE 15 MG/ML IJ SOLN
15.0000 mg | Freq: Once | INTRAMUSCULAR | Status: AC
  Administered 2023-05-12: 15 mg via INTRAVENOUS
  Filled 2023-05-12: qty 1

## 2023-05-12 MED ORDER — APIXABAN 5 MG PO TABS: ORAL_TABLET | ORAL | 0 refills | Status: AC

## 2023-05-12 MED ORDER — APIXABAN 5 MG PO TABS
10.0000 mg | ORAL_TABLET | Freq: Two times a day (BID) | ORAL | Status: DC
  Administered 2023-05-12: 10 mg via ORAL
  Filled 2023-05-12: qty 2

## 2023-05-12 MED ORDER — TRAZODONE HCL 50 MG PO TABS: 50.0000 mg | ORAL_TABLET | Freq: Every evening | ORAL | Status: AC | PRN

## 2023-05-12 MED ORDER — APIXABAN 5 MG PO TABS: 5.0000 mg | ORAL_TABLET | Freq: Two times a day (BID) | ORAL | Status: DC

## 2023-05-12 MED ORDER — ACETAMINOPHEN 500 MG PO TABS: 1000.0000 mg | ORAL_TABLET | Freq: Three times a day (TID) | ORAL | Status: DC | PRN

## 2023-05-12 MED ORDER — POLYETHYLENE GLYCOL 3350 17 G PO PACK
17.0000 g | PACK | Freq: Every day | ORAL | Status: DC | PRN
  Administered 2023-05-12: 17 g via ORAL
  Filled 2023-05-12: qty 1

## 2023-05-12 MED ORDER — POLYETHYLENE GLYCOL 3350 17 G PO PACK: 17.0000 g | PACK | Freq: Every day | ORAL | 0 refills | Status: AC | PRN

## 2023-05-12 NOTE — TOC CM/SW Note (Signed)
Transition of Care [facility]) - Inpatient Brief Assessment   Patient Details  Name: [name] MRN: [id number] Date of Birth: 1975-12-26  Transition of Care [facility]) CM/SW Contact:    Villa Herb, LCSWA Phone Number: 05/12/2023, 10:23 AM   Clinical Narrative: Transition of Care Department [facility]) has reviewed patient and no TOC needs have been identified at this time. We will continue to monitor patient advancement through interdisciplinary progression rounds. If new patient transition needs arise, please place a TOC consult.  Transition of Care Asessment: Insurance and Status: Insurance coverage has been reviewed Patient has primary care physician: Yes Home environment has been reviewed: from home Prior level of function:: independent Prior/Current Home Services: No current home services Social Determinants of Health Reivew: SDOH reviewed no interventions necessary Readmission risk has been reviewed: Yes Transition of care needs: no transition of care needs at this time

## 2023-05-12 NOTE — Plan of Care (Signed)

## 2023-05-12 NOTE — Consult Note (Signed)
PHARMACY - ANTICOAGULATION CONSULT NOTE  Pharmacy Consult for heparin Indication: pulmonary embolus  Allergies  Allergen Reactions   Codeine Other (See Comments)    Chest tightness    Patient Measurements: Height: 5\' 7"  (170.2 cm) Weight: 75.6 kg (166 lb 10.7 oz) IBW/kg (Calculated) : 61.6 Heparin Dosing Weight: 73 kg  Vital Signs: Temp: 98.1 F (36.7 C) (10/08 2340) Temp Source: Oral (10/08 2340) BP: 106/79 (10/08 2340) Pulse Rate: 92 (10/08 2340)  Labs: Recent Labs    05/11/23 1320 05/11/23 1532 05/11/23 1913 05/12/23 0206  HGB 9.9*  --   --  9.4*  HCT 31.2*  --   --  28.9*  PLT 246  --   --  238  APTT  --   --  29  --   LABPROT  --   --  14.6  --   INR  --   --  1.1  --   HEPARINUNFRC  --   --   --  0.35  CREATININE 0.92  --   --  0.78  TROPONINIHS <2 <2  --   --     Estimated Creatinine Clearance: 90.2 mL/min (by C-G formula based on SCr of 0.78 mg/dL).   Medical History: Past Medical History:  Diagnosis Date   Anxiety    Chronic pain    Dr. Wynn Banker   Degenerative disc disease    Spinal, small syrinx   Depression    Esophageal stricture    Status post dilatation   Fibromyalgia    GERD (gastroesophageal reflux disease)    Hiatal hernia    Internal hemorrhoids without mention of complication    Irritable bowel syndrome    Palpitations    No documented arrhythmias   Personal history of colonic polyps 10/04/2000   HYPERPLASTIC POLYP   POTS (postural orthostatic tachycardia syndrome)    Syncope    Negative tilt table test   Syringomyelia and syringobulbia (HCC)     Medications:  Not on any anticoagulants   Assessment: [name] is a 47 yo F presenting with 4 day history of left chest wall pain with pain that radiates to the left shoulder. No history of recent injury. 10/8 CTA scan was positive for PE.Troponin <2, no evidence of left heart strain.   10/9 AM update:  Heparin level therapeutic   Goal of Therapy:  Heparin level 0.3-0.7  units/ml Monitor platelets by anticoagulation protocol: Yes   Plan:  Cont heparin 1300 units/hr 1100 heparin level  Abran Duke, PharmD, BCPS Clinical Pharmacist Phone: [phone]

## 2023-05-12 NOTE — Consult Note (Signed)
PHARMACY - ANTICOAGULATION CONSULT NOTE  Pharmacy Consult for heparin >> apixaban Indication: pulmonary embolus  Allergies  Allergen Reactions   Codeine Other (See Comments)    Chest tightness    Patient Measurements: Height: 5\' 7"  (170.2 cm) Weight: 75.6 kg (166 lb 10.7 oz) IBW/kg (Calculated) : 61.6 Heparin Dosing Weight: 73 kg  Vital Signs: Temp: 98.4 F (36.9 C) (10/09 1152) Temp Source: Oral (10/09 1152) BP: 100/72 (10/09 1315) Pulse Rate: 93 (10/09 1315)  Labs: Recent Labs    05/11/23 1320 05/11/23 1532 05/11/23 1913 05/12/23 0206  HGB 9.9*  --   --  9.4*  HCT 31.2*  --   --  28.9*  PLT 246  --   --  238  APTT  --   --  29  --   LABPROT  --   --  14.6  --   INR  --   --  1.1  --   HEPARINUNFRC  --   --   --  0.35  CREATININE 0.92  --   --  0.78  TROPONINIHS <2 <2  --   --     Estimated Creatinine Clearance: 90.2 mL/min (by C-G formula based on SCr of 0.78 mg/dL).   Medical History: Past Medical History:  Diagnosis Date   Anxiety    Chronic pain    Dr. Wynn Banker   Degenerative disc disease    Spinal, small syrinx   Depression    Esophageal stricture    Status post dilatation   Fibromyalgia    GERD (gastroesophageal reflux disease)    Hiatal hernia    Internal hemorrhoids without mention of complication    Irritable bowel syndrome    Palpitations    No documented arrhythmias   Personal history of colonic polyps 10/04/2000   HYPERPLASTIC POLYP   POTS (postural orthostatic tachycardia syndrome)    Syncope    Negative tilt table test   Syringomyelia and syringobulbia (HCC)     Medications:  Not on any anticoagulants   Assessment: [name] is a 47 yo F presenting with 4 day history of left chest wall pain with pain that radiates to the left shoulder. No history of recent injury. 10/8 CTA scan was positive for PE.Troponin <2, no evidence of left heart strain.    Goal of Therapy:  Heparin level 0.3-0.7 units/ml Monitor platelets by anticoagulation  protocol: Yes   Plan:  Stop heparin infusion Start apixaban 10 mg twice daily x 7 days followed by apixaban 5 mg twice daily  [name], PharmD Clinical Pharmacist 05/12/2023 3:56 PM

## 2023-05-12 NOTE — Progress Notes (Signed)
*  PRELIMINARY RESULTS* Echocardiogram 2D Echocardiogram has been performed.  Carolyne Fiscal 05/12/2023, 9:32 AM

## 2023-05-12 NOTE — Progress Notes (Signed)
Patient triggered yellow mews d/t BP and RR. Patient denies dizziness, shortness of breath, chest pain. Patient is not in distress at this time. Notified Dr. Gwenlyn Perking and Chales Abrahams [facility], Charge RN. No new orders at this time. Yellow mews protocol initiated.     05/12/23 1008  Assess: MEWS Score  Temp 98.2 F (36.8 C)  BP (!) 83/54  MAP (mmHg) (!) 64  Pulse Rate 95  Resp (!) 24  Level of Consciousness Alert  SpO2 97 %  O2 Device Room Air  Assess: MEWS Score  MEWS Temp 0  MEWS Systolic 1  MEWS Pulse 0  MEWS RR 1  MEWS LOC 0  MEWS Score 2  MEWS Score Color Yellow  Assess: if the MEWS score is Yellow or Red  Were vital signs accurate and taken at a resting state? Yes  Does the patient meet 2 or more of the SIRS criteria? Yes  Does the patient have a confirmed or suspected source of infection? No  MEWS guidelines implemented  Yes, yellow  Treat  MEWS Interventions Considered administering scheduled or prn medications/treatments as ordered  Take Vital Signs  Increase Vital Sign Frequency  Yellow: Q2hr x1, continue Q4hrs until patient remains green for 12hrs  Escalate  MEWS: Escalate Yellow: Discuss with charge nurse and consider notifying provider and/or RRT  Notify: Charge Nurse/RN  Name of Charge Nurse/RN Notified Odis Hollingshead, RN  Provider Notification  Provider Name/Title Dr. Gwenlyn Perking  Date Provider Notified 05/12/23  Time Provider Notified 1013  Method of Notification Page  Notification Reason Other (Comment) (yellow mews)  Provider response No new orders  Date of Provider Response 05/12/23  Time of Provider Response 1015  Assess: SIRS CRITERIA  SIRS Temperature  0  SIRS Pulse 1  SIRS Respirations  1  SIRS WBC 0  SIRS Score Sum  2

## 2023-05-12 NOTE — Plan of Care (Signed)
  Problem: Health Behavior/Discharge Planning: Goal: Ability to manage health-related needs will improve Outcome: Progressing   Problem: Clinical Measurements: Goal: Respiratory complications will improve Outcome: Progressing Goal: Cardiovascular complication will be avoided Outcome: Progressing   Problem: Activity: Goal: Risk for activity intolerance will decrease Outcome: Progressing   Problem: Coping: Goal: Level of anxiety will decrease Outcome: Progressing   Problem: Elimination: Goal: Will not experience complications related to bowel motility Outcome: Not Progressing

## 2023-05-12 NOTE — TOC Benefit Eligibility Note (Signed)
Patient Product/process development scientist completed.    The patient is insured through [facility]. Patient has Medicare and is not eligible for a copay card, but may be able to apply for patient assistance, if available.    Ran test claim for Eliquis 5 mg and the current 30 day co-pay is $11.20.   This test claim was processed through [facility]- copay amounts may vary at other pharmacies due to pharmacy/plan contracts, or as the patient moves through the different stages of their insurance plan.     Roland Earl, CPHT Pharmacy Technician III Certified Patient Advocate [facility] Patient Advocate Team Direct Number: [phone]  Fax: [phone]

## 2023-05-12 NOTE — Discharge Summary (Signed)
Physician Discharge Summary   Patient: [name] MRN: [id number] DOB: [birth year]  Admit date:     05/11/2023  Discharge date: 05/12/23  Discharge Physician: Vassie Loll   PCP: Elfredia Nevins, MD   Recommendations at discharge:  PCP to follow hemoglobin trend/stability Repeat basic metabolic panel to follow electrolytes and renal function Patient with first episode of unprovoked pulmonary embolism; will require 3 to 36-month anticoagulation therapy.  Discharge Diagnoses: Principal Problem:   Pulmonary embolism [facility]) Active Problems:   Fibromyalgia   Atelectasis   Chronic back pain   Brief Hospital admission course: As per H&P written by Dr. Thomes Dinning on 05/11/2023. [name] is a 47 y.o. female with medical history significant of hypertension, chronic back pain, fibromyalgia who presents to the emergency department due to 4 days onset of left-sided chest pain.  Patient complained of a persistent pain in the lateral aspect of left chest wall with radiation to anterior and posterior chest wall.  Pain is aggravated with deep inspiration and this worsened last night and pain is alleviated with home pain medications.  She denies any recent fall or injury.   ED Course:  In the emergency department, she was hemodynamically stable.  Workup in the ED showed normocytic anemia, BMP was normal except for sodium of 133, bicarb 21, blood glucose 147.  Urinalysis was positive for proteinuria, troponin x 2 was normal, pregnancy test was negative. CT angiography chest with contrast was positive for acute pulmonary embolism with involvement of the lobar and segmental branches of both lower lobes.  No evidence of right heart strain. Small partially loculated left pleural effusion with compressive atelectasis in the left lower lobe and lingula. Left ribs and chest x-ray showed small left pleural effusion with probable associated compressive atelectatic changes in the left lung lower lobe. No acute  displaced rib fracture. IV heparin drip was started.  Percocet was given.  Hospitalist was asked to admit patient for further evaluation and management.  Assessment and Plan: 1-acute pulmonary embolism -CT angiography demonstrating acute pulmonary embolism  -No provoking factors -Excellent response to IV heparin with subsequent transition to oral Eliquis for outpatient therapy -2D echo demonstrating no right heart strain and preserved ejection fraction -Lower extremity Dopplers without findings of acute DVT. -Outpatient follow-up with PCP recommended. -Hemoglobin stable and no signs of bleeding present.  Patient advised to avoid the use of NSAIDs.  2-left lower lobe compressive atelectasis -No infection currently appreciated -Continue treatment with anticoagulation as mentioned above -Continue the use of incentive spirometer.  3-chronic back pain -Continue home analgesic regimen  4-fibromyalgia -Continue Elavil and gabapentin  5-history of depression with anxiety -Resume home antidepressant regimen -Overall stable mood appreciated -No suicidal ideation or hallucinations.  Consultants: None Procedures performed: See below for x-ray reports. Disposition: Home Diet recommendation: Regular diet.  DISCHARGE MEDICATION: Allergies as of 05/12/2023       Reactions   Codeine Other (See Comments)   Chest tightness        Medication List     STOP taking these medications    DULoxetine 60 MG capsule Commonly known as: CYMBALTA   ibuprofen 200 MG tablet Commonly known as: ADVIL   meloxicam 7.5 MG tablet Commonly known as: MOBIC   Paxlovid (300/100) 20 x 150 MG & 10 x 100MG  Tbpk Generic drug: nirmatrelvir/ritonavir       TAKE these medications    acetaminophen 500 MG tablet Commonly known as: TYLENOL Take 2 tablets (1,000 mg total) by mouth every 8 (  eight) hours as needed for headache (pain and/or fever).   amitriptyline 50 MG tablet Commonly known as:  ELAVIL Take 50 mg by mouth at bedtime.   amphetamine-dextroamphetamine 20 MG tablet Commonly known as: ADDERALL Take 20 mg by mouth 2 (two) times daily. ER   apixaban 5 MG Tabs tablet Commonly known as: ELIQUIS Take 2 tablets (10 mg total) by mouth 2 (two) times daily for 7 days, THEN 1 tablet (5 mg total) 2 (two) times daily. Start taking on: May 12, 2023   butalbital-acetaminophen-caffeine 50-325-40 MG tablet Commonly known as: FIORICET Take 1 tablet by mouth as needed.   clonazePAM 1 MG tablet Commonly known as: KLONOPIN Take 1 mg by mouth 3 (three) times daily.   ergocalciferol 1.25 MG (50000 UT) capsule Commonly known as: VITAMIN D2 Take 50,000 Units by mouth once a week.   escitalopram 10 MG tablet Commonly known as: LEXAPRO Take 10 mg by mouth daily.   fluticasone 50 MCG/ACT nasal spray Commonly known as: FLONASE Place 2 sprays into both nostrils daily.   gabapentin 100 MG capsule Commonly known as: NEURONTIN Take 1 capsule (100 mg total) by mouth 4 (four) times daily.   HYDROcodone-acetaminophen 7.5-325 MG tablet Commonly known as: NORCO Take 2 tablets by mouth every 8 (eight) hours as needed for moderate pain.   methocarbamol 500 MG tablet Commonly known as: ROBAXIN Take 500 mg by mouth every 6 (six) hours as needed for muscle spasms.   polyethylene glycol 17 g packet Commonly known as: MIRALAX / GLYCOLAX Take 17 g by mouth daily as needed for mild constipation.   traZODone 50 MG tablet Commonly known as: DESYREL Take 1 tablet (50 mg total) by mouth at bedtime as needed for sleep. What changed:  when to take this reasons to take this        Follow-up Information     Elfredia Nevins, MD. Schedule an appointment as soon as possible for a visit in 10 day(s).   Specialty: Internal Medicine Contact information: [address] [place] [address] [phone]                Discharge Exam: Ceasar Mons Weights   05/11/23 1307  05/11/23 2046  Weight: 73 kg 75.6 kg   General exam: Alert, awake, oriented x 3; good saturation on room air and no complaining of chest pain. Respiratory system: Clear to auscultation. Respiratory effort normal. Cardiovascular system:RRR. No rubs or gallops; no JVD. Gastrointestinal system: Abdomen is nondistended, soft and nontender. No organomegaly or masses felt. Normal bowel sounds heard. Central nervous system: Alert and oriented. No focal neurological deficits. Extremities: No cyanosis or clubbing. Skin: No petechiae. Psychiatry: Judgement and insight appear normal. Mood & affect appropriate.    Condition at discharge: Stable and improved.  The results of significant diagnostics from this hospitalization (including imaging, microbiology, ancillary and laboratory) are listed below for reference.   Imaging Studies: ECHOCARDIOGRAM COMPLETE  Result Date: 05/12/2023    ECHOCARDIOGRAM REPORT   Patient Name:   [name] Date of Exam: 05/12/2023 Medical Rec #:  [id number]       Height:       67.0 in Accession #:    [id number]      Weight:       166.7 lb Date of Birth:  May 26, 1976       BSA:          1.871 m Patient Age:    47 years        BP:  95/65 mmHg Patient Gender: F               HR:           90 bpm. [facility] Procedure: 2D Echo, Cardiac Doppler and Color Doppler Indications:    Pulmonary embolus  History:        Patient has prior history of Echocardiogram examinations, most                 recent 11/16/2011. Signs/Symptoms:Chest Pain and Syncope; Risk                 Factors:Hypertension and Former Smoker. Pulmonary embolus.  Sonographer:    Mikki Harbor Referring Phys: [id number] OLADAPO ADEFESO  Sonographer Comments: Image acquisition challenging due to respiratory motion. IMPRESSIONS  1. Left ventricular ejection fraction, by estimation, is 70 to 75%. The left ventricle has hyperdynamic function. The left ventricle has no regional wall motion abnormalities.  Left ventricular diastolic parameters were normal.  2. Right ventricular systolic function is normal. The right ventricular size is normal. There is normal pulmonary artery systolic pressure.  3. The mitral valve is normal in structure. No evidence of mitral valve regurgitation. No evidence of mitral stenosis.  4. The aortic valve was not well visualized. Aortic valve regurgitation is not visualized. No aortic stenosis is present.  5. The inferior vena cava is normal in size with greater than 50% respiratory variability, suggesting right atrial pressure of 3 mmHg. Comparison(s): No prior Echocardiogram. FINDINGS  Left Ventricle: Left ventricular ejection fraction, by estimation, is 70 to 75%. The left ventricle has hyperdynamic function. The left ventricle has no regional wall motion abnormalities. The left ventricular internal cavity size was normal in size. There is no left ventricular hypertrophy. Left ventricular diastolic parameters were normal. Right Ventricle: The right ventricular size is normal. No increase in right ventricular wall thickness. Right ventricular systolic function is normal. There is normal pulmonary artery systolic pressure. The tricuspid regurgitant velocity is 2.12 m/s, and  with an assumed right atrial pressure of 3 mmHg, the estimated right ventricular systolic pressure is 21.0 mmHg. Left Atrium: Left atrial size was normal in size. Right Atrium: Right atrial size was normal in size. Pericardium: There is no evidence of pericardial effusion. Mitral Valve: The mitral valve is normal in structure. No evidence of mitral valve regurgitation. No evidence of mitral valve stenosis. MV peak gradient, 2.8 mmHg. The mean mitral valve gradient is 1.0 mmHg. Tricuspid Valve: The tricuspid valve is normal in structure. Tricuspid valve regurgitation is trivial. No evidence of tricuspid stenosis. Aortic Valve: The aortic valve was not well visualized. Aortic valve regurgitation is not visualized. No  aortic stenosis is present. Aortic valve mean gradient measures 5.0 mmHg. Aortic valve peak gradient measures 10.1 mmHg. Aortic valve area, by VTI measures 2.33 cm. Pulmonic Valve: The pulmonic valve was not well visualized. Pulmonic valve regurgitation is not visualized. No evidence of pulmonic stenosis. Aorta: The aortic root is normal in size and structure. Venous: The inferior vena cava is normal in size with greater than 50% respiratory variability, suggesting right atrial pressure of 3 mmHg. IAS/Shunts: No atrial level shunt detected by color flow Doppler.  LEFT VENTRICLE PLAX 2D LVIDd:         5.00 cm   Diastology LVIDs:         2.90 cm   LV e' medial:  10.00 cm/s LV PW:         0.70 cm  LV e' lateral: 13.50 cm/s LV IVS:        0.80 cm LVOT diam:     2.00 cm LV SV:         70 LV SV Index:   38 LVOT Area:     3.14 cm  RIGHT VENTRICLE RV Basal diam:  2.85 cm RV Mid diam:    2.70 cm RV S prime:     11.30 cm/s TAPSE (M-mode): 2.6 cm LEFT ATRIUM             Index        RIGHT ATRIUM           Index LA diam:        2.80 cm 1.50 cm/m   RA Area:     14.40 cm LA Vol (A2C):   47.9 ml 25.59 ml/m  RA Volume:   34.90 ml  18.65 ml/m LA Vol (A4C):   29.9 ml 15.98 ml/m LA Biplane Vol: 38.6 ml 20.63 ml/m  AORTIC VALVE                    PULMONIC VALVE AV Area (Vmax):    2.31 cm     PV Vmax:       0.79 m/s AV Area (Vmean):   2.39 cm     PV Peak grad:  2.5 mmHg AV Area (VTI):     2.33 cm AV Vmax:           159.00 cm/s AV Vmean:          99.900 cm/s AV VTI:            0.302 m AV Peak Grad:      10.1 mmHg AV Mean Grad:      5.0 mmHg LVOT Vmax:         117.00 cm/s LVOT Vmean:        76.100 cm/s LVOT VTI:          0.224 m LVOT/AV VTI ratio: 0.74  AORTA Ao Root diam: 2.90 cm MITRAL VALVE              TRICUSPID VALVE MV Area (PHT): 4.23 cm   TR Peak grad:   18.0 mmHg MV Area VTI:   3.84 cm   TR Vmax:        212.00 cm/s MV Peak grad:  2.8 mmHg MV Mean grad:  1.0 mmHg   SHUNTS MV Vmax:       0.84 m/s   Systemic VTI:  0.22  m MV Vmean:      48.2 cm/s  Systemic Diam: 2.00 cm Vishnu Priya Mallipeddi Electronically signed by Winfield Rast Mallipeddi Signature Date/Time: 05/12/2023/3:17:47 PM    Final    US Venous Img Lower Bilateral (DVT)  Result Date: 05/12/2023 CLINICAL DATA:  141700 Pulmonary embolism (HCC) 141700 EXAM: BILATERAL LOWER EXTREMITY VENOUS DOPPLER ULTRASOUND TECHNIQUE: Gray-scale sonography with graded compression, as well as color Doppler and duplex ultrasound were performed to evaluate the lower extremity deep venous systems from the level of the common femoral vein and including the common femoral, femoral, profunda femoral, popliteal and calf veins including the posterior tibial, peroneal and gastrocnemius veins when visible. The superficial great saphenous vein was also interrogated. Spectral Doppler was utilized to evaluate flow at rest and with distal augmentation maneuvers in the common femoral, femoral and popliteal veins. COMPARISON:  CTA PE 05/11/2023. FINDINGS: RIGHT LOWER EXTREMITY VENOUS Normal compressibility of the RIGHT common femoral, superficial femoral, and  popliteal veins, as well as the visualized calf veins. Visualized portions of profunda femoral vein and great saphenous vein unremarkable. No filling defects to suggest DVT on grayscale or color Doppler imaging. Doppler waveforms show normal direction of venous flow, normal respiratory plasticity and response to augmentation. OTHER No evidence of superficial thrombophlebitis or abnormal fluid collection. Limitations: none LEFT LOWER EXTREMITY VENOUS Normal compressibility of the LEFT common femoral, superficial femoral, and popliteal veins, as well as the visualized calf veins. Visualized portions of profunda femoral vein and great saphenous vein unremarkable. No filling defects to suggest DVT on grayscale or color Doppler imaging. Doppler waveforms show normal direction of venous flow, normal respiratory plasticity and response to augmentation.  OTHER No evidence of superficial thrombophlebitis or abnormal fluid collection. Limitations: none IMPRESSION: No evidence of femoropopliteal DVT or superficial thrombophlebitis within either lower extremity. Roanna Banning, MD [facility] Electronically Signed   By: Roanna Banning M.D.   On: 05/12/2023 10:30   CT Angio Chest PE W and/or Wo Contrast  Result Date: 05/11/2023 CLINICAL DATA:  Left-sided chest pain radiating into the shoulder and left breast for 4 days. No known injury. Pulmonary embolism suspected, high probability. EXAM: CT ANGIOGRAPHY CHEST WITH CONTRAST TECHNIQUE: Multidetector CT imaging of the chest was performed using the standard protocol during bolus administration of intravenous contrast. Multiplanar CT image reconstructions and MIPs were obtained to evaluate the vascular anatomy. RADIATION DOSE REDUCTION: This exam was performed according to the departmental dose-optimization program which includes automated exposure control, adjustment of the mA and/or kV according to patient size and/or use of iterative reconstruction technique. CONTRAST:  75mL OMNIPAQUE IOHEXOL 350 MG/ML SOLN COMPARISON:  Chest radiographs 05/11/2023 and 04/17/2015. No prior relevant chest CT. FINDINGS: Cardiovascular: The pulmonary arteries are well opacified with contrast to the level of the subsegmental branches. Study is positive for acute pulmonary embolism with involvement of the lobar and segmental branches of both lower lobes. The main pulmonary arteries are patent. No evidence of right heart strain. No acute systemic arterial abnormalities are identified. The heart size is normal. There is no pericardial effusion. Mediastinum/Nodes: There are no enlarged mediastinal, hilar or axillary lymph nodes. The thyroid gland, trachea and esophagus demonstrate no significant findings. Lungs/Pleura: Small left pleural effusion appears partially loculated with extension  of fluid into the major fissure. No right pleural effusion. No pneumothorax. There is compressive atelectasis in the left lower lobe and lingula. Minimal dependent atelectasis in the right lung. There are scattered small subpleural nodules bilaterally, most consistent with incidental lymph nodes; no specific follow-up imaging recommended. No suspicious pulmonary nodules. Upper abdomen:  The visualized upper abdomen is unremarkable. Musculoskeletal/Chest wall: There is no chest wall mass or suspicious osseous finding. Unless specific follow-up recommendations are mentioned in the findings or impression sections, no imaging follow-up of any mentioned incidental findings is recommended. Review of the MIP images confirms the above findings. IMPRESSION: 1. Study is positive for acute pulmonary embolism with involvement of the lobar and segmental branches of both lower lobes. No evidence of right heart strain. 2. Small partially loculated left pleural effusion with compressive atelectasis in the left lower lobe and lingula. 3. Critical Value/emergent results were called by telephone at the time of interpretation on 05/11/2023 at 6:21 pm to provider Gloris Manchester , who verbally acknowledged these results. Electronically Signed   By: Carey Bullocks M.D.   On: 05/11/2023 18:21   DG Ribs Unilateral W/Chest Left  Result Date: 05/11/2023 CLINICAL DATA:  Left rib pain. EXAM: LEFT RIBS AND CHEST - 3+ VIEW COMPARISON:  04/17/2015. FINDINGS: There is small left pleural effusion with probable associated compressive atelectatic changes in the left lung lower lobe. Bilateral lung fields are otherwise clear. Right costophrenic angle is clear. Normal cardio-mediastinal silhouette. No acute osseous abnormalities. No acute displaced rib fracture.  No focal rib lesion. The soft tissues are within normal limits. IMPRESSION: 1. Small left pleural effusion with probable associated compressive atelectatic changes in the left lung lower lobe.  2. No acute displaced rib fracture. Electronically Signed   By: Jules Schick M.D.   On: 05/11/2023 15:06    Microbiology: No results found for this or any previous visit.  Labs: CBC: Recent Labs  Lab 05/11/23 1320 05/12/23 0206  WBC 7.8 5.7  HGB 9.9* 9.4*  HCT 31.2* 28.9*  MCV 100.0 98.6  PLT 246 238   Basic Metabolic Panel: Recent Labs  Lab 05/11/23 1320 05/12/23 0206  NA 133* 136  K 3.5 3.8  CL 102 104  CO2 21* 25  GLUCOSE 147* 115*  BUN 12 10  CREATININE 0.92 0.78  CALCIUM 8.4* 8.1*  MG  --  2.2  PHOS  --  3.1   Liver Function Tests: Recent Labs  Lab 05/12/23 0206  AST 11*  ALT 9  ALKPHOS 52  BILITOT 0.2*  PROT 7.0  ALBUMIN 3.0*   CBG: No results for input(s): "GLUCAP" in the last 168 hours.  Discharge time spent: greater than 30 minutes.  Signed: Vassie Loll, MD [facility] 05/12/2023

## 2023-05-12 NOTE — Discharge Instructions (Signed)
Information on my medicine - ELIQUIS (apixaban)  This medication education was reviewed with me or my healthcare representative as part of my discharge preparation.  The pharmacist that spoke with me during my hospital stay was:  Layken Doenges C Lebert Lovern, RPH  Why was Eliquis prescribed for you? Eliquis was prescribed to treat blood clots that may have been found in the veins of your legs (deep vein thrombosis) or in your lungs (pulmonary embolism) and to reduce the risk of them occurring again.  What do You need to know about Eliquis ? The starting dose is 10 mg (two 5 mg tablets) taken TWICE daily for the FIRST SEVEN (7) DAYS, then  the dose is reduced to ONE 5 mg tablet taken TWICE daily.  Eliquis may be taken with or without food.   Try to take the dose about the same time in the morning and in the evening. If you have difficulty swallowing the tablet whole please discuss with your pharmacist how to take the medication safely.  Take Eliquis exactly as prescribed and DO NOT stop taking Eliquis without talking to the doctor who prescribed the medication.  Stopping may increase your risk of developing a new blood clot.  Refill your prescription before you run out.  After discharge, you should have regular check-up appointments with your healthcare provider that is prescribing your Eliquis.    What do you do if you miss a dose? If a dose of ELIQUIS is not taken at the scheduled time, take it as soon as possible on the same day and twice-daily administration should be resumed. The dose should not be doubled to make up for a missed dose.  Important Safety Information A possible side effect of Eliquis is bleeding. You should call your healthcare provider right away if you experience any of the following: ? Bleeding from an injury or your nose that does not stop. ? Unusual colored urine (red or dark brown) or unusual colored stools (red or black). ? Unusual bruising for unknown reasons. ? A  serious fall or if you hit your head (even if there is no bleeding).  Some medicines may interact with Eliquis and might increase your risk of bleeding or clotting while on Eliquis. To help avoid this, consult your healthcare provider or pharmacist prior to using any new prescription or non-prescription medications, including herbals, vitamins, non-steroidal anti-inflammatory drugs (NSAIDs) and supplements.  This website has more information on Eliquis (apixaban): http://www.eliquis.com/eliquis/home  

## 2023-05-18 ENCOUNTER — Other Ambulatory Visit: Payer: Self-pay | Admitting: Registered Nurse

## 2023-05-18 DIAGNOSIS — E063 Autoimmune thyroiditis: Secondary | ICD-10-CM | POA: Diagnosis not present

## 2023-05-18 DIAGNOSIS — I2699 Other pulmonary embolism without acute cor pulmonale: Secondary | ICD-10-CM | POA: Diagnosis not present

## 2023-05-18 DIAGNOSIS — I10 Essential (primary) hypertension: Secondary | ICD-10-CM | POA: Diagnosis not present

## 2023-05-18 DIAGNOSIS — G9332 Myalgic encephalomyelitis/chronic fatigue syndrome: Secondary | ICD-10-CM | POA: Diagnosis not present

## 2023-05-18 NOTE — Telephone Encounter (Signed)
PMP was Reviewed.  Discharged Summary was reviewed.  Gabapentin was filled on 04/28/2023. Gabapentin prescription was sent too pharmacy, call placed to Ms. Amescua regarding the above, no answer. Left message to return the call.

## 2023-06-01 ENCOUNTER — Encounter: Payer: Medicare Other | Attending: Physical Medicine & Rehabilitation | Admitting: Registered Nurse

## 2023-06-01 VITALS — Ht 67.0 in | Wt 160.0 lb

## 2023-06-01 DIAGNOSIS — M25512 Pain in left shoulder: Secondary | ICD-10-CM | POA: Diagnosis not present

## 2023-06-01 DIAGNOSIS — M5412 Radiculopathy, cervical region: Secondary | ICD-10-CM | POA: Diagnosis not present

## 2023-06-01 DIAGNOSIS — Z5181 Encounter for therapeutic drug level monitoring: Secondary | ICD-10-CM | POA: Diagnosis not present

## 2023-06-01 DIAGNOSIS — M542 Cervicalgia: Secondary | ICD-10-CM | POA: Diagnosis not present

## 2023-06-01 DIAGNOSIS — Z79891 Long term (current) use of opiate analgesic: Secondary | ICD-10-CM | POA: Insufficient documentation

## 2023-06-01 DIAGNOSIS — M25511 Pain in right shoulder: Secondary | ICD-10-CM | POA: Insufficient documentation

## 2023-06-01 DIAGNOSIS — G894 Chronic pain syndrome: Secondary | ICD-10-CM | POA: Diagnosis not present

## 2023-06-01 DIAGNOSIS — M797 Fibromyalgia: Secondary | ICD-10-CM | POA: Insufficient documentation

## 2023-06-01 DIAGNOSIS — M2550 Pain in unspecified joint: Secondary | ICD-10-CM | POA: Insufficient documentation

## 2023-06-01 DIAGNOSIS — M545 Low back pain, unspecified: Secondary | ICD-10-CM | POA: Insufficient documentation

## 2023-06-01 DIAGNOSIS — R519 Headache, unspecified: Secondary | ICD-10-CM | POA: Insufficient documentation

## 2023-06-01 DIAGNOSIS — G8929 Other chronic pain: Secondary | ICD-10-CM | POA: Insufficient documentation

## 2023-06-01 MED ORDER — HYDROCODONE-ACETAMINOPHEN 7.5-325 MG PO TABS: 2.0000 | ORAL_TABLET | Freq: Three times a day (TID) | ORAL | 0 refills | Status: DC | PRN

## 2023-06-01 NOTE — Progress Notes (Signed)
Subjective:    Patient ID: [name], female    DOB: 1976-05-24, 47 y.o.   MRN: [id number]  HPI: [name] is a 47 y.o. female who is scheduled for My-Chart Visit, she reports she has diarrhea, her appointment was changed to a virtual visit.  I connected with [name]  by a video enabled telemedicine application and verified that I am speaking with the correct person using two identifiers.  Location: Patient: In her Home Provider: In the Office    I discussed the limitations of evaluation and management by telemedicine and the availability of in person appointments. The patient expressed understanding and agreed to proceed.  She states her pain is located in her neck radiating into her bilateral shoulders, bilateral shoulder pain L>R and lower back pain. She also reports she has a headache, reports generalized joint pain . She rates her pain 7. Her current exercise regime is walking and performing stretching exercises.  [name] was admitted to [facility] on 05/11/2023 with Pulmonary Embolism, she was discharged on 05/12/2023. Discharge summary was reviewed.   [name] Morphine equivalent is 45.00 MME. She  is also prescribed Clonazepam  by Dr. [name]  .We have discussed the black box warning of using opioids and benzodiazepines. I highlighted the dangers of using these drugs together and discussed the adverse events including respiratory suppression, overdose, cognitive impairment and importance of compliance with current regimen. We will continue to monitor and adjust as indicated.    Last UDS was Performed on 04/22/2023, it was consistent.    Pain Inventory Average Pain 8 Pain Right Now 7 My pain is constant, sharp, burning, dull, stabbing, tingling, and aching  In the last 24 hours, has pain interfered with the following? General activity 9 Relation with others 8 Enjoyment of life 8 What TIME of day is your pain at its worst? morning , daytime, evening, and  night Sleep (in general) Fair  Pain is worse with: walking, bending, sitting, inactivity, standing, and some activites Pain improves with: rest and medication Relief from Meds: 7  Family History  Problem Relation Age of Onset   Diabetes type II Mother    Colon polyps Mother    Diverticulitis Mother    Diabetes Mother    Heart disease Mother    Osteoporosis Father    Diabetes type II Brother    Uterine cancer Other    Ovarian cancer Other    COPD Paternal Grandfather    Heart attack Maternal Grandmother    Social History   Socioeconomic History   Marital status: Married    Spouse name: Not on file   Number of children: 2   Years of education: Not on file   Highest education level: Not on file  Occupational History   Occupation: Disability    Employer: UNEMPLOYED  Tobacco Use   Smoking status: Former    Current packs/day: 0.00    Types: Cigarettes    Quit date: 06/30/2011    Years since quitting: 11.9   Smokeless tobacco: Never  Vaping Use   Vaping status: Some Days   Substances: Flavoring  Substance and Sexual Activity   Alcohol use: No    Alcohol/week: 0.0 standard drinks of alcohol   Drug use: No   Sexual activity: Yes  Other Topics Concern   Not on file  Social History Narrative   Not on file   Social Determinants of Health   Financial Resource Strain: Not on file  Food Insecurity: No Food Insecurity (05/11/2023)   Hunger Vital Sign    Worried About Running Out of Food in the Last Year: Never true    Ran Out of Food in the Last Year: Never true  Transportation Needs: No Transportation Needs (05/11/2023)   PRAPARE - Administrator, Civil Service (Medical): No    Lack of Transportation (Non-Medical): No  Physical Activity: Not on file  Stress: Not on file  Social Connections: Not on file   Past Surgical History:  Procedure Laterality Date   CESAREAN SECTION     Left elbow surgery     PARTIAL HYSTERECTOMY     TONSILLECTOMY     Past  Surgical History:  Procedure Laterality Date   CESAREAN SECTION     Left elbow surgery     PARTIAL HYSTERECTOMY     TONSILLECTOMY     Past Medical History:  Diagnosis Date   Anxiety    Chronic pain    Dr. Wynn Banker   Degenerative disc disease    Spinal, small syrinx   Depression    Esophageal stricture    Status post dilatation   Fibromyalgia    GERD (gastroesophageal reflux disease)    Hiatal hernia    Internal hemorrhoids without mention of complication    Irritable bowel syndrome    Palpitations    No documented arrhythmias   Personal history of colonic polyps 10/04/2000   HYPERPLASTIC POLYP   POTS (postural orthostatic tachycardia syndrome)    Syncope    Negative tilt table test   Syringomyelia and syringobulbia (HCC)    There were no vitals taken for this visit.  Opioid Risk Score:   Fall Risk Score:  `1  Depression screen [facility] 2/9     06/01/2023    2:51 PM 04/22/2023    1:56 PM 01/18/2023    3:48 PM 12/18/2022    2:53 PM 11/18/2022    3:03 PM 09/30/2022    2:36 PM 07/22/2022    1:55 PM  Depression screen PHQ 2/9  Decreased Interest 0 1 1 1 1 1 1   Down, Depressed, Hopeless 0 1 1 1 1 1 1   PHQ - 2 Score 0 2 2 2 2 2 2       Review of Systems  Musculoskeletal:  Positive for back pain, gait problem and neck pain.  All other systems reviewed and are negative.     Objective:   Physical Exam Vitals and nursing note reviewed.  Musculoskeletal:     Comments: No Physical Exam: Virtual Visit         Assessment & Plan:  1. Lumbar degenerative disc disease: 05/31/2024. Continue with current treatment regimen: Refilled : HYDROcodone 7.5/325 mg two tablets every 8 hours as needed #180. Second script sent for the following month to accommodate scheduled appointment. We will continue the opioid monitoring program, this consists of regular clinic visits, examinations, urine drug screen, pill counts as well as use of [place] Controlled Substance Reporting system.  A 12 month History has been reviewed on the [place] Controlled Substance Reporting System on 06/01/2023 2. Thoracic Spondylosis: Continue current treatment with HEP and  current medication regime. 06/01/2023. 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 Continue to Monitor. 06/01/2023.  Lyrica discontinued due to adverse effects. Continue with exercise and heat therapy. 06/01/2023 4. Fibromyalgia:Continue with Heat and  exercise Regimen. Continue Gabapentin. 06/01/2023  5.Depression and anxiety: PCP Following. 06/01/2023 6. Orthostatic hypotension: Cardiology Following.  06/01/2023 7. Polyarthralgia: Continue HEP as tolerated . Continue to monitor. 10/292024 8. Chronic Bilateral  Shoulder Pain:  Continue current medication regime. Continue HEP as Tolerated. 10/29//2024. 9. Muscle Spasm:No complaints today.  Continue to monitor.  06/01/2023. 10. Neuropathic Pain: Continue Gabapentin. Continue to Monitor.  06/01/2023  11. Right elbow Pain: No complaints today. Continue current medication regimen. Continue to monitor. 06/01/2023 12. Right  Knee with OA: Continue HEP as Tolerated. Continue current medication regimen. Continue to monitor. 06/01/2023  13. Right Hip Pain: No complaints today. Continue with HEP as Tolerated. Continue to Monitor. 06/01/2023   F/U in 1 Month   Virtual Visit Established Patient Location of Patient: In her Home Location of Provider: In the Office

## 2023-06-04 ENCOUNTER — Encounter: Payer: Self-pay | Admitting: Registered Nurse

## 2023-06-10 ENCOUNTER — Inpatient Hospital Stay: Payer: Medicare Other | Attending: Oncology | Admitting: Oncology

## 2023-06-10 ENCOUNTER — Encounter: Payer: Self-pay | Admitting: Oncology

## 2023-06-10 ENCOUNTER — Inpatient Hospital Stay: Payer: Medicare Other

## 2023-06-10 VITALS — BP 111/82 | HR 112 | Temp 98.1°F | Ht 65.5 in | Wt 162.8 lb

## 2023-06-10 DIAGNOSIS — I2694 Multiple subsegmental pulmonary emboli without acute cor pulmonale: Secondary | ICD-10-CM

## 2023-06-10 DIAGNOSIS — D509 Iron deficiency anemia, unspecified: Secondary | ICD-10-CM | POA: Diagnosis not present

## 2023-06-10 DIAGNOSIS — Z860100 Personal history of colon polyps, unspecified: Secondary | ICD-10-CM | POA: Diagnosis not present

## 2023-06-10 DIAGNOSIS — N63 Unspecified lump in unspecified breast: Secondary | ICD-10-CM

## 2023-06-10 DIAGNOSIS — I2699 Other pulmonary embolism without acute cor pulmonale: Secondary | ICD-10-CM | POA: Insufficient documentation

## 2023-06-10 DIAGNOSIS — Z7901 Long term (current) use of anticoagulants: Secondary | ICD-10-CM | POA: Insufficient documentation

## 2023-06-10 DIAGNOSIS — K635 Polyp of colon: Secondary | ICD-10-CM

## 2023-06-10 DIAGNOSIS — F411 Generalized anxiety disorder: Secondary | ICD-10-CM | POA: Diagnosis not present

## 2023-06-10 DIAGNOSIS — D649 Anemia, unspecified: Secondary | ICD-10-CM

## 2023-06-10 DIAGNOSIS — Z0000 Encounter for general adult medical examination without abnormal findings: Secondary | ICD-10-CM

## 2023-06-10 LAB — COMPREHENSIVE METABOLIC PANEL
ALT: 12 U/L (ref 0–44)
AST: 16 U/L (ref 15–41)
Albumin: 4.8 g/dL (ref 3.5–5.0)
Alkaline Phosphatase: 78 U/L (ref 38–126)
Anion gap: 11 (ref 5–15)
BUN: 13 mg/dL (ref 6–20)
CO2: 23 mmol/L (ref 22–32)
Calcium: 9.6 mg/dL (ref 8.9–10.3)
Chloride: 103 mmol/L (ref 98–111)
Creatinine, Ser: 0.88 mg/dL (ref 0.44–1.00)
GFR, Estimated: >60 mL/min (ref >60)
Glucose, Bld: 83 mg/dL (ref 70–99)
Potassium: 3.8 mmol/L (ref 3.5–5.1)
Sodium: 137 mmol/L (ref 135–145)
Total Bilirubin: 0.6 mg/dL (ref <1.2)
Total Protein: 8.9 g/dL — ABNORMAL HIGH (ref 6.5–8.1)

## 2023-06-10 LAB — CBC WITH DIFFERENTIAL/PLATELET
Abs Immature Granulocytes: 0.01 10*3/uL (ref 0.00–0.07)
Basophils Absolute: 0 10*3/uL (ref 0.0–0.1)
Basophils Relative: 1 %
Eosinophils Absolute: 0 10*3/uL (ref 0.0–0.5)
Eosinophils Relative: 1 %
HCT: 39.3 % (ref 36.0–46.0)
Hemoglobin: 12.8 g/dL (ref 12.0–15.0)
Immature Granulocytes: 0 %
Lymphocytes Relative: 32 %
Lymphs Abs: 1.9 10*3/uL (ref 0.7–4.0)
MCH: 31.9 pg (ref 26.0–34.0)
MCHC: 32.6 g/dL (ref 30.0–36.0)
MCV: 98 fL (ref 80.0–100.0)
Monocytes Absolute: 0.3 10*3/uL (ref 0.1–1.0)
Monocytes Relative: 5 %
Neutro Abs: 3.7 10*3/uL (ref 1.7–7.7)
Neutrophils Relative %: 61 %
Platelets: 290 10*3/uL (ref 150–400)
RBC: 4.01 MIL/uL (ref 3.87–5.11)
RDW: 13.3 % (ref 11.5–15.5)
WBC: 6 10*3/uL (ref 4.0–10.5)
nRBC: 0 % (ref 0.0–0.2)

## 2023-06-10 LAB — IRON AND TIBC
Iron: 89 ug/dL (ref 28–170)
Saturation Ratios: 19 % (ref 10.4–31.8)
TIBC: 463 ug/dL — ABNORMAL HIGH (ref 250–450)
UIBC: 374 ug/dL

## 2023-06-10 LAB — FERRITIN: Ferritin: 47 ng/mL (ref 11–307)

## 2023-06-10 NOTE — Assessment & Plan Note (Signed)
Bilateral pulmonary embolism with no identified clots in the legs. Discussed the potential causes of blood clots and the importance of mobility. Family history of blood clots noted. -Continue Eliquis for lifetime in the setting of unprovoked blood clot. -Will do thrombophilia testing.  Will not with protein C&S levels as patient is on Eliquis -Plan to reassess in 3 months with D-dimer test to potentially reduce Eliquis dose.

## 2023-06-10 NOTE — Progress Notes (Signed)
[facility] at [facility] HEMATOLOGY NEW VISIT  Sabrina Nevins, MD  REASON FOR REFERRAL: Venous thromboembolism  SUMMARY OF HEMATOLOGIC HISTORY: 1st episode: 05/11/23: PE Risk factors: Immobility, recent covid infection Current treatment: Eliquis 5mg  twice daily    HISTORY OF PRESENT ILLNESS: [name] 47 y.o. female referred for recent diagnosis of pulmonary embolism and probable thrombophilia testing. She was accompanied by her husband and mother-in-law today.  The patient complains of fatigue and back pain today.  She reports spending most of the day in a chair or bed due to back pain and fatigue. She also reports experiencing shortness of breath and palpitations.  The patient reports a history of cervical dysplasia and had a partial hysterectomy done.  She also reports abnormal mammograms and had biopsies performed.  The patient has not had a colonoscopy since 2009.  Patient also reports a lot of depression associated with not being able to move.  She relies on her husband for most of the home chores.  She is currently taking Eliquis and is compliant with that.  Patient reports occasional falls from POTS.   The patient has a history of smoking but quit 12 years ago but currently vapes.  She does not drink alcohol.  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 The patient's mother had a history of blood clots and the patient has a family history of hemochromatosis.    I have reviewed the past medical history, past surgical history, social history and family history with the patient   ALLERGIES:  is allergic to codeine.  MEDICATIONS:  Current Outpatient Medications  Medication Sig Dispense Refill   acetaminophen (TYLENOL) 500 MG tablet Take 2 tablets (1,000 mg total) by mouth every 8 (eight) hours as needed for headache (pain and/or fever). (Patient not taking: Reported on 06/10/2023)     amitriptyline (ELAVIL) 50 MG  tablet Take 50 mg by mouth at bedtime.     amphetamine-dextroamphetamine (ADDERALL) 20 MG tablet Take 20 mg by mouth 2 (two) times daily. ER     apixaban (ELIQUIS) 5 MG TABS tablet Take 2 tablets (10 mg total) by mouth 2 (two) times daily for 7 days, THEN 1 tablet (5 mg total) 2 (two) times daily. 388 tablet 0   butalbital-acetaminophen-caffeine (FIORICET) 50-325-40 MG tablet Take 1 tablet by mouth as needed.     clonazePAM (KLONOPIN) 1 MG tablet Take 1 mg by mouth 3 (three) times daily.     ergocalciferol (VITAMIN D2) 1.25 MG (50000 UT) capsule Take 50,000 Units by mouth once a week.     escitalopram (LEXAPRO) 10 MG tablet Take 10 mg by mouth daily. (Patient not taking: Reported on 06/10/2023)     fluticasone (FLONASE) 50 MCG/ACT nasal spray Place 2 sprays into both nostrils daily.     gabapentin (NEURONTIN) 100 MG capsule TAKE ONE CAPSULE BY MOUTH FOUR TIMES DAILY 120 capsule 2   HYDROcodone-acetaminophen (NORCO) 7.5-325 MG tablet Take 2 tablets by mouth every 8 (eight) hours as needed for moderate pain (pain score 4-6). 180 tablet 0   methocarbamol (ROBAXIN) 500 MG tablet Take 500 mg by mouth every 6 (six) hours as needed for muscle spasms.     polyethylene glycol (MIRALAX / GLYCOLAX) 17 g packet Take 17 g by mouth daily as needed for mild constipation. 30 each 0   traZODone (DESYREL) 50 MG tablet Take 1 tablet (50 mg total) by mouth at bedtime as needed for sleep.  No current facility-administered medications for this visit.     REVIEW OF SYSTEMS:   Constitutional: Denies fevers, chills or night sweats Eyes: Denies blurriness of vision Ears, nose, mouth, throat, and face: Denies mucositis or sore throat Respiratory: Denies cough, dyspnea or wheezes Gastrointestinal:  Denies nausea, heartburn or change in bowel habits Skin: Denies abnormal skin rashes Lymphatics: Denies new lymphadenopathy or easy bruising Neurological:Denies numbness, tingling or new weaknesses All other systems were  reviewed with the patient and are negative.  PHYSICAL EXAMINATION:   Vitals:   06/10/23 1504  BP: 111/82  Pulse: (!) 112  Temp: 98.1 F (36.7 C)    GENERAL:alert, no distress and comfortable SKIN: skin color, texture, turgor are normal, no rashes or significant lesions EYES: normal, Conjunctiva are pink and non-injected, sclera clear LUNGS: clear to auscultation and percussion with normal breathing effort HEART: regular rate & rhythm and no murmurs and no lower extremity edema ABDOMEN:abdomen soft, non-tender and normal bowel sounds Musculoskeletal:no cyanosis of digits and no clubbing  NEURO: alert & oriented x 3 with fluent speech  LABORATORY DATA:  I have reviewed the data as listed  Lab Results  Component Value Date   WBC 5.7 05/12/2023   NEUTROABS 1.8 12/29/2011   HGB 9.4 (L) 05/12/2023   HCT 28.9 (L) 05/12/2023   MCV 98.6 05/12/2023   PLT 238 05/12/2023      Component Value Date/Time   NA 136 05/12/2023 0206   K 3.8 05/12/2023 0206   CL 104 05/12/2023 0206   CO2 25 05/12/2023 0206   GLUCOSE 115 (H) 05/12/2023 0206   BUN 10 05/12/2023 0206   CREATININE 0.78 05/12/2023 0206   CREATININE 0.76 06/20/2013 1455   CALCIUM 8.1 (L) 05/12/2023 0206   PROT 7.0 05/12/2023 0206   ALBUMIN 3.0 (L) 05/12/2023 0206   AST 11 (L) 05/12/2023 0206   ALT 9 05/12/2023 0206   ALKPHOS 52 05/12/2023 0206   BILITOT 0.2 (L) 05/12/2023 0206   GFRNONAA >60 05/12/2023 0206   GFRAA >90 01/04/2013 2237      Chemistry      Component Value Date/Time   NA 136 05/12/2023 0206   K 3.8 05/12/2023 0206   CL 104 05/12/2023 0206   CO2 25 05/12/2023 0206   BUN 10 05/12/2023 0206   CREATININE 0.78 05/12/2023 0206   CREATININE 0.76 06/20/2013 1455      Component Value Date/Time   CALCIUM 8.1 (L) 05/12/2023 0206   ALKPHOS 52 05/12/2023 0206   AST 11 (L) 05/12/2023 0206   ALT 9 05/12/2023 0206   BILITOT 0.2 (L) 05/12/2023 0206       RADIOGRAPHIC STUDIES: I have personally reviewed  the radiological images as listed and agreed with the findings in the report.  CT PE: 05/11/23:  IMPRESSION: 1. Study is positive for acute pulmonary embolism with involvement of the lobar and segmental branches of both lower lobes. No evidence of right heart strain. 2. Small partially loculated left pleural effusion with compressive atelectasis in the left lower lobe and lingula.  Venous US: No evidence of DVT on B/L lower extremity  ASSESSMENT & PLAN:  Patient is a 47yo F with newly diagnosed pulmonary embolism.  Pulmonary embolism (HCC) Bilateral pulmonary embolism with no identified clots in the legs. Discussed the potential causes of blood clots and the importance of mobility. Family history of blood clots noted. -Continue Eliquis for lifetime in the setting of unprovoked blood clot. -Will do thrombophilia testing.  Will not with protein C&S  levels as patient is on Eliquis -Plan to reassess in 3 months with D-dimer test to potentially reduce Eliquis dose.   Anemia Patient has microcytic anemia with a hemoglobin of 9.4.Likely contributing to fatigue and shortness of breath -Will obtain iron studies -If confirmed iron deficiency, plan for IV iron infusions pending insurance approval. -Consider oral iron supplementation every other day.  Breast mass History of suspicious breast mass with biopsy performed. Last mammogram in July of last year. -Order screening mammogram.  Colon polyps History of colon polyps with last colonoscopy in 2009. -Refer to GI for colonoscopy.   Anxiety state Patient reports severe anxiety and depression.  No suicidal intentions. -Consider social work consult for psychological support. -Counseling and emotional support provided -Patient refused psychiatry referral at this time due to concerns about insurance approval   Orders Placed This Encounter  Procedures   MM 3D SCREENING MAMMOGRAM BILATERAL BREAST    Standing Status:   Future    Standing  Expiration Date:   06/09/2024    Order Specific Question:   Reason for Exam (SYMPTOM  OR DIAGNOSIS REQUIRED)    Answer:   Screening    Order Specific Question:   Is the patient pregnant?    Answer:   No    Order Specific Question:   Preferred imaging location?    Answer:   [facility]   Beta-2-glycoprotein i abs, IgG/M/A    Standing Status:   Future    Number of Occurrences:   1    Standing Expiration Date:   06/09/2024   Cardiolipin antibodies, IgG, IgM, IgA    Standing Status:   Future    Number of Occurrences:   1    Standing Expiration Date:   06/09/2024   Factor 5 leiden    Standing Status:   Future    Number of Occurrences:   1    Standing Expiration Date:   06/09/2024   Prothrombin gene mutation    Standing Status:   Future    Number of Occurrences:   1    Standing Expiration Date:   06/09/2024   Lupus anticoagulant panel    Standing Status:   Future    Number of Occurrences:   1    Standing Expiration Date:   06/09/2024   CBC with Differential/Platelet    Standing Status:   Future    Number of Occurrences:   1    Standing Expiration Date:   06/09/2024   Comprehensive metabolic panel    Standing Status:   Future    Number of Occurrences:   1    Standing Expiration Date:   06/09/2024   Ferritin    Standing Status:   Future    Number of Occurrences:   1    Standing Expiration Date:   06/09/2024   Iron and TIBC    Standing Status:   Future    Number of Occurrences:   1    Standing Expiration Date:   06/09/2024   Ambulatory referral to Social Work    Standing Status:   Future    Standing Expiration Date:   06/09/2024    Referral Priority:   Routine    Referral Type:   Consultation    Referral Reason:   Specialty Services Required    Number of Visits Requested:   1    The total time spent in the appointment was 60 minutes encounter with patients including review of chart and various tests results, discussions  about plan of care and coordination of care plan   All  questions were answered. The patient knows to call the clinic with any problems, questions or concerns. No barriers to learning was detected.   Cindie Crumbly, MD 11/7/20244:24 PM

## 2023-06-10 NOTE — Assessment & Plan Note (Signed)
Patient has microcytic anemia with a hemoglobin of 9.4.Likely contributing to fatigue and shortness of breath -Will obtain iron studies -If confirmed iron deficiency, plan for IV iron infusions pending insurance approval. -Consider oral iron supplementation every other day.

## 2023-06-10 NOTE — Assessment & Plan Note (Addendum)
Patient reports severe anxiety and depression.  No suicidal intentions. -Consider social work consult for psychological support. -Counseling and emotional support provided -Patient refused psychiatry referral at this time due to concerns about insurance approval

## 2023-06-10 NOTE — Assessment & Plan Note (Signed)
History of colon polyps with last colonoscopy in 2009. -Refer to GI for colonoscopy.

## 2023-06-10 NOTE — Addendum Note (Signed)
Addended byCindie Crumbly on: 06/10/2023 04:29 PM   Modules accepted: Orders

## 2023-06-10 NOTE — Assessment & Plan Note (Signed)
History of suspicious breast mass with biopsy performed. Last mammogram in July of last year. -Order screening mammogram.

## 2023-06-10 NOTE — Patient Instructions (Signed)
VISIT SUMMARY:  During today's visit, we discussed your ongoing issues with fatigue, back pain, shortness of breath, and palpitations. We reviewed your history of pulmonary embolism, anemia, and other health concerns. We also talked about your family history of blood clots and hemochromatosis, and your past medical history including cervical dysplasia and breast biopsies. We have outlined a plan to address each of these issues and ensure you receive the appropriate follow-up care.  YOUR PLAN:  -PULMONARY EMBOLISM: Pulmonary embolism is a condition where blood clots block the arteries in the lungs. We will continue your Eliquis medication for life and reassess in 3 months with a D-dimer test to potentially reduce the dose.  -IRON DEFICIENCY ANEMIA: Iron deficiency anemia is a condition where there is a lack of iron in the body, leading to low hemoglobin levels and causing fatigue and shortness of breath. We will order iron studies and, if confirmed, plan for IV iron infusions pending insurance approval. You may also consider taking oral iron supplements every other day.  -BREAST MASS: Given your history of a suspicious breast mass and previous biopsies, we will order a screening mammogram to monitor your breast health.  -COLON POLYPS: Colon polyps are growths on the inner lining of the colon which can sometimes develop into cancer. We will refer you to a gastroenterologist for a colonoscopy since your last one was in 2009.  -CERVICAL DYSPLASIA: Cervical dysplasia is a condition where abnormal cells are found on the cervix, which can potentially lead to cancer. We recommend a follow-up with a gynecologist since your last Pap smear was at age 49.   -GENERAL HEALTH MAINTENANCE: For your overall health,  We also suggest a social work consult for psychological support. We will reassess your general health in 3 months.  INSTRUCTIONS:  Please follow up in 3 months for a reassessment of your pulmonary  embolism and general health. Ensure you complete the iron studies, screening mammogram, and schedule a colonoscopy and gynecologist appointment as discussed. If you experience any new or worsening symptoms, contact our office immediately.

## 2023-06-11 LAB — BETA-2-GLYCOPROTEIN I ABS, IGG/M/A
Beta-2 Glyco I IgG: <9 GPI IgG units (ref 0–20)
Beta-2-Glycoprotein I IgA: <9 GPI IgA units (ref 0–25)
Beta-2-Glycoprotein I IgM: <9 GPI IgM units (ref 0–32)

## 2023-06-11 LAB — DRVVT MIX: dRVVT Mix: 44.2 s — ABNORMAL HIGH (ref 0.0–40.4)

## 2023-06-11 LAB — LUPUS ANTICOAGULANT PANEL
DRVVT: 52.1 s — ABNORMAL HIGH (ref 0.0–47.0)
PTT Lupus Anticoagulant: 31.1 s (ref 0.0–43.5)

## 2023-06-11 LAB — DRVVT CONFIRM: dRVVT Confirm: 1.1 {ratio} (ref 0.8–1.2)

## 2023-06-12 LAB — CARDIOLIPIN ANTIBODIES, IGG, IGM, IGA
Anticardiolipin IgA: <9 U/mL (ref 0–11)
Anticardiolipin IgG: <9 [GPL'U]/mL (ref 0–14)
Anticardiolipin IgM: 11 [MPL'U]/mL (ref 0–12)

## 2023-06-15 LAB — FACTOR 5 LEIDEN

## 2023-06-16 ENCOUNTER — Ambulatory Visit: Payer: Medicare Other | Admitting: Internal Medicine

## 2023-06-16 ENCOUNTER — Encounter: Payer: Self-pay | Admitting: Internal Medicine

## 2023-06-17 ENCOUNTER — Inpatient Hospital Stay: Payer: Medicare Other | Admitting: Licensed Clinical Social Worker

## 2023-06-17 DIAGNOSIS — I2694 Multiple subsegmental pulmonary emboli without acute cor pulmonale: Secondary | ICD-10-CM

## 2023-06-17 LAB — PROTHROMBIN GENE MUTATION

## 2023-06-18 NOTE — Progress Notes (Signed)
[facility] CSW Progress Note  Clinical Child psychotherapist contacted patient by phone to check in following hematology visit.  Pt expressed struggling w/ anxiety and depression during visit.  At present pt is being prescribed klonopin by her PCP.  Pt reports the dose recently has been lowered at her pain management doctor's request.  Per pt both she and her husband were established w/ a psychiatrist; however, the psychiatrist no longer accepts their insurance and pt has had a difficult time trying to find a new provider.  Pt receives disability due to chronic back issues and has received disability since 2008.  Pt's husband receives disability due to bi-polar disorder.  Per pt she and her husband were also receiving Medicaid; however, a few years ago it was discontinued.  CSW encouraged pt to re-apply for Medicaid as she and her husband may now qualify.  If pt qualifies for Medicaid she can be referred to [facility] for behavioral health case management.  Pt provided w/ contact information for CSW to reach out following decision regarding Medicaid.     Rachel Moulds, LCSW Clinical Social Worker [facility]

## 2023-06-23 ENCOUNTER — Ambulatory Visit: Payer: Medicare Other | Admitting: Internal Medicine

## 2023-06-23 ENCOUNTER — Encounter: Payer: Self-pay | Admitting: Internal Medicine

## 2023-06-23 ENCOUNTER — Ambulatory Visit (HOSPITAL_COMMUNITY)
Admission: RE | Admit: 2023-06-23 | Discharge: 2023-06-23 | Disposition: A | Discharge status: Home / Self Care | Payer: Medicare Other | Source: Ambulatory Visit | Attending: Oncology | Admitting: Oncology

## 2023-06-23 ENCOUNTER — Encounter (HOSPITAL_COMMUNITY): Payer: Self-pay

## 2023-06-23 VITALS — BP 109/73 | HR 106 | Temp 98.8°F | Ht 65.5 in | Wt 164.9 lb

## 2023-06-23 DIAGNOSIS — K5900 Constipation, unspecified: Secondary | ICD-10-CM

## 2023-06-23 DIAGNOSIS — K5909 Other constipation: Secondary | ICD-10-CM | POA: Diagnosis not present

## 2023-06-23 DIAGNOSIS — Z1211 Encounter for screening for malignant neoplasm of colon: Secondary | ICD-10-CM

## 2023-06-23 DIAGNOSIS — R1319 Other dysphagia: Secondary | ICD-10-CM

## 2023-06-23 DIAGNOSIS — R1310 Dysphagia, unspecified: Secondary | ICD-10-CM | POA: Diagnosis not present

## 2023-06-23 DIAGNOSIS — Z1231 Encounter for screening mammogram for malignant neoplasm of breast: Secondary | ICD-10-CM | POA: Diagnosis not present

## 2023-06-23 DIAGNOSIS — Z7901 Long term (current) use of anticoagulants: Secondary | ICD-10-CM | POA: Diagnosis not present

## 2023-06-23 DIAGNOSIS — R140 Abdominal distension (gaseous): Secondary | ICD-10-CM

## 2023-06-23 DIAGNOSIS — Z0000 Encounter for general adult medical examination without abnormal findings: Secondary | ICD-10-CM

## 2023-06-23 DIAGNOSIS — I2699 Other pulmonary embolism without acute cor pulmonale: Secondary | ICD-10-CM

## 2023-06-23 DIAGNOSIS — D649 Anemia, unspecified: Secondary | ICD-10-CM

## 2023-06-23 NOTE — Progress Notes (Signed)
Primary Care Physician:  Elfredia Nevins, MD Primary Gastroenterologist:  Dr. Marletta Lor  Chief Complaint  Patient presents with   thrombophilia    Referred by oncologist for thrombophilia.     HPI:   [name] is a 47 y.o. female who presents to clinic today by referral from her hematologist Dr. Anders Simmonds for evaluation.  Patient admitted to [facility] 05/11/2023, found to have bilateral pulmonary emboli, unprovoked.  2D echocardiogram with preserved EF, no evidence of right heart strain.  Lower extremity Dopplers without evidence of DVT.  Currently following with hematology being worked up for clotting disorders.  Found to be anemic with hemoglobin 9.4, MCV 98.6 05/12/23.  Recheck 06/10/2023 hemoglobin 12.8, iron levels normal.  Patient denies any melena hematochezia.  No family history of colorectal malignancy.  Does report both parents had polyps prior.  Last colonoscopy 2009 unremarkable.  Colonoscopy 2002 with few small hyperplastic polyps removed.  Does note issues with abdominal bloating/distention.  Also with esophageal dysphagia.  Feels like food and pills get stuck in her substernal region.  Last EGD 05/10/2008 unremarkable, empiric esophageal dilation performed.  Reports mild constipation, takes MiraLAX which helps.  Past Medical History:  Diagnosis Date   Anxiety    Chronic pain    Dr. Wynn Banker   Degenerative disc disease    Spinal, small syrinx   Depression    Esophageal stricture    Status post dilatation   Fibromyalgia    GERD (gastroesophageal reflux disease)    Hiatal hernia    Internal hemorrhoids without mention of complication    Irritable bowel syndrome    Palpitations    No documented arrhythmias   Personal history of colonic polyps 10/04/2000   HYPERPLASTIC POLYP   POTS (postural orthostatic tachycardia syndrome)    Syncope    Negative tilt table test   Syringomyelia and syringobulbia (HCC)     Past Surgical History:  Procedure  Laterality Date   CESAREAN SECTION     Left elbow surgery     PARTIAL HYSTERECTOMY     TONSILLECTOMY      Current Outpatient Medications  Medication Sig Dispense Refill   amitriptyline (ELAVIL) 50 MG tablet Take 50 mg by mouth at bedtime.     amphetamine-dextroamphetamine (ADDERALL) 20 MG tablet Take 20 mg by mouth 2 (two) times daily. ER     apixaban (ELIQUIS) 5 MG TABS tablet Take 2 tablets (10 mg total) by mouth 2 (two) times daily for 7 days, THEN 1 tablet (5 mg total) 2 (two) times daily. 388 tablet 0   butalbital-acetaminophen-caffeine (FIORICET) 50-325-40 MG tablet Take 1 tablet by mouth as needed.     clonazePAM (KLONOPIN) 1 MG tablet Take 1 mg by mouth 3 (three) times daily.     ergocalciferol (VITAMIN D2) 1.25 MG (50000 UT) capsule Take 50,000 Units by mouth once a week.     fluticasone (FLONASE) 50 MCG/ACT nasal spray Place 2 sprays into both nostrils daily.     gabapentin (NEURONTIN) 100 MG capsule TAKE ONE CAPSULE BY MOUTH FOUR TIMES DAILY 120 capsule 2   HYDROcodone-acetaminophen (NORCO) 7.5-325 MG tablet Take 2 tablets by mouth every 8 (eight) hours as needed for moderate pain (pain score 4-6). 180 tablet 0   methocarbamol (ROBAXIN) 500 MG tablet Take 500 mg by mouth every 6 (six) hours as needed for muscle spasms.     polyethylene glycol (MIRALAX / GLYCOLAX) 17 g packet Take 17 g by mouth daily as needed for mild  constipation. 30 each 0   traZODone (DESYREL) 50 MG tablet Take 1 tablet (50 mg total) by mouth at bedtime as needed for sleep.     escitalopram (LEXAPRO) 10 MG tablet Take 10 mg by mouth daily. (Patient not taking: Reported on 06/10/2023)     No current facility-administered medications for this visit.    Allergies as of 06/23/2023 - Review Complete 06/10/2023  Allergen Reaction Noted   Codeine Other (See Comments) 12/31/2015    Family History  Problem Relation Age of Onset   Diabetes type II Mother    Colon polyps Mother    Diverticulitis Mother     Diabetes Mother    Heart disease Mother    Osteoporosis Father    Diabetes type II Brother    Uterine cancer Other    Ovarian cancer Other    COPD Paternal Grandfather    Heart attack Maternal Grandmother     Social History   Socioeconomic History   Marital status: Married    Spouse name: Not on file   Number of children: 2   Years of education: Not on file   Highest education level: Not on file  Occupational History   Occupation: Disability    Employer: UNEMPLOYED  Tobacco Use   Smoking status: Former    Current packs/day: 0.00    Types: Cigarettes    Quit date: 06/30/2011    Years since quitting: 11.9   Smokeless tobacco: Never  Vaping Use   Vaping status: Some Days   Substances: Nicotine, Flavoring  Substance and Sexual Activity   Alcohol use: No    Alcohol/week: 0.0 standard drinks of alcohol   Drug use: No   Sexual activity: Yes  Other Topics Concern   Not on file  Social History Narrative   Not on file   Social Determinants of Health   Financial Resource Strain: High Risk (06/10/2023)   Overall Financial Resource Strain (CARDIA)    Difficulty of Paying Living Expenses: Very hard  Food Insecurity: Food Insecurity Present (06/10/2023)   Hunger Vital Sign    Worried About Running Out of Food in the Last Year: Sometimes true    Ran Out of Food in the Last Year: Never true  Transportation Needs: No Transportation Needs (06/10/2023)   PRAPARE - Administrator, Civil Service (Medical): No    Lack of Transportation (Non-Medical): No  Physical Activity: Inactive (06/10/2023)   Exercise Vital Sign    Days of Exercise per Week: 7 days    Minutes of Exercise per Session: 0 min  Stress: Stress Concern Present (06/10/2023)   [employer] of Occupational Health - Occupational Stress Questionnaire    Feeling of Stress : Rather much  Social Connections: Moderately Isolated (06/10/2023)   Social Connection and Isolation Panel [NHANES]    Frequency of  Communication with Friends and Family: Three times a week    Frequency of Social Gatherings with Friends and Family: Once a week    Attends Religious Services: Never    Database administrator or Organizations: No    Attends Banker Meetings: Never    Marital Status: Married  Catering manager Violence: Not At Risk (06/10/2023)   Humiliation, Afraid, Rape, and Kick questionnaire    Fear of Current or Ex-Partner: No    Emotionally Abused: No    Physically Abused: No    Sexually Abused: No    Subjective: Review of Systems  Constitutional:  Negative for chills and fever.  HENT:  Negative for congestion and hearing loss.   Eyes:  Negative for blurred vision and double vision.  Respiratory:  Negative for cough and shortness of breath.   Cardiovascular:  Negative for chest pain and palpitations.  Gastrointestinal:  Negative for abdominal pain, blood in stool, constipation, diarrhea, heartburn, melena and vomiting.       Abdominal bloating, esophageal dysphagia  Genitourinary:  Negative for dysuria and urgency.  Musculoskeletal:  Negative for joint pain and myalgias.  Skin:  Negative for itching and rash.  Neurological:  Negative for dizziness and headaches.  Psychiatric/Behavioral:  Negative for depression. The patient is not nervous/anxious.        Objective: BP 109/73 (BP Location: Left Arm, Patient Position: Sitting, Cuff Size: Normal)   Pulse (!) 106   Temp 98.8 F (37.1 C) (Oral)   Ht 5' 5.5" (1.664 m)   Wt 164 lb 14.4 oz (74.8 kg)   BMI 27.02 kg/m  Physical Exam Constitutional:      Appearance: Normal appearance.  HENT:     Head: Normocephalic and atraumatic.  Eyes:     Extraocular Movements: Extraocular movements intact.     Conjunctiva/sclera: Conjunctivae normal.  Cardiovascular:     Rate and Rhythm: Normal rate and regular rhythm.  Pulmonary:     Effort: Pulmonary effort is normal.     Breath sounds: Normal breath sounds.  Abdominal:     General:  Bowel sounds are normal.     Palpations: Abdomen is soft.  Musculoskeletal:        General: No swelling. Normal range of motion.     Cervical back: Normal range of motion and neck supple.  Skin:    General: Skin is warm and dry.     Coloration: Skin is not jaundiced.  Neurological:     General: No focal deficit present.     Mental Status: She is alert and oriented to person, place, and time.  Psychiatric:        Mood and Affect: Mood normal.        Behavior: Behavior normal.      Assessment/Plan:  1.  Esophageal dysphagia, abdominal bloating- Will schedule for EGD with possible dilation to evaluate for peptic ulcer disease, esophagitis, gastritis, H. Pylori, duodenitis, or other. Will also evaluate for esophageal stricture, Schatzki's ring, esophageal web or other.  2.  Colon cancer screening-will perform screening colonoscopy at same time as upper endoscopy..  The risks including infection, bleed, or perforation as well as benefits, limitations, alternatives and imponderables have been reviewed with the patient. Questions have been answered. All parties agreeable.  Patient will need to hold her Eliquis x 2 days prior to procedures.  Discussed with her hematologist who agreed to wait at least 3 months from initial diagnosis of PE before holding.  We will tentatively plan on procedures being done late January 2025.  Counseled patient on slight increased risk of cardiovascular event during this 2-day period and she understands.  3.  Chronic constipation-well-controlled on MiraLAX.  Will continue.  4.  Pulmonary embolism, long-term use of anticoagulation-see above  Thank you Dr. Anders Simmonds for the kind referral  06/23/2023 11:35 AM   Disclaimer: This note was dictated with voice recognition software. Similar sounding words can inadvertently be transcribed and may not be corrected upon review.

## 2023-06-23 NOTE — Patient Instructions (Addendum)
We will schedule you for upper endoscopy and colonoscopy to further evaluate your anemia.  I may elect to stretch your esophagus depending on findings.  You will need to hold your Eliquis x 2 days prior to procedure.  Will plan on having these procedures done towards the end of January 2025.  Continue MiraLAX for your chronic constipation.  It was very nice meeting you both today.  Dr. Marletta Lor

## 2023-06-29 ENCOUNTER — Encounter: Payer: Medicare Other | Attending: Physical Medicine & Rehabilitation | Admitting: Registered Nurse

## 2023-06-29 ENCOUNTER — Encounter: Payer: Self-pay | Admitting: Registered Nurse

## 2023-06-29 VITALS — BP 108/75 | HR 93 | Ht 65.5 in | Wt 163.0 lb

## 2023-06-29 DIAGNOSIS — M797 Fibromyalgia: Secondary | ICD-10-CM | POA: Insufficient documentation

## 2023-06-29 DIAGNOSIS — M542 Cervicalgia: Secondary | ICD-10-CM | POA: Insufficient documentation

## 2023-06-29 DIAGNOSIS — Z5181 Encounter for therapeutic drug level monitoring: Secondary | ICD-10-CM | POA: Diagnosis not present

## 2023-06-29 DIAGNOSIS — M5412 Radiculopathy, cervical region: Secondary | ICD-10-CM | POA: Insufficient documentation

## 2023-06-29 DIAGNOSIS — M545 Low back pain, unspecified: Secondary | ICD-10-CM | POA: Insufficient documentation

## 2023-06-29 DIAGNOSIS — G894 Chronic pain syndrome: Secondary | ICD-10-CM | POA: Diagnosis not present

## 2023-06-29 DIAGNOSIS — M25561 Pain in right knee: Secondary | ICD-10-CM | POA: Diagnosis not present

## 2023-06-29 DIAGNOSIS — Z79891 Long term (current) use of opiate analgesic: Secondary | ICD-10-CM | POA: Insufficient documentation

## 2023-06-29 DIAGNOSIS — M2550 Pain in unspecified joint: Secondary | ICD-10-CM | POA: Diagnosis not present

## 2023-06-29 DIAGNOSIS — M25511 Pain in right shoulder: Secondary | ICD-10-CM | POA: Diagnosis not present

## 2023-06-29 DIAGNOSIS — G8929 Other chronic pain: Secondary | ICD-10-CM | POA: Diagnosis not present

## 2023-06-29 DIAGNOSIS — M25512 Pain in left shoulder: Secondary | ICD-10-CM | POA: Diagnosis not present

## 2023-06-29 MED ORDER — HYDROCODONE-ACETAMINOPHEN 7.5-325 MG PO TABS: 2.0000 | ORAL_TABLET | Freq: Three times a day (TID) | ORAL | 0 refills | Status: DC | PRN

## 2023-06-29 NOTE — Progress Notes (Signed)
Subjective:    Patient ID: [name], female    DOB: May 11, 1976, 47 y.o.   MRN: [id number]  HPI: [name] is a 47 y.o. female who returns for follow up appointment for chronic pain and medication refill. She states her pain is located in her neck radiating into her bilateral shoulders, lower back pain and right knee pain. She also reports generalized joint pain. She rates her pain 8. Her  current exercise regime is walking and performing stretching exercises.  Ms. Caison Morphine equivalent is 45.00 MME. She is also prescribed Clonazepam by Dr. [name] .We have discussed the black box warning of using opioids and benzodiazepines. I highlighted the dangers of using these drugs together and discussed the adverse events including respiratory suppression, overdose, cognitive impairment and importance of compliance with current regimen. We will continue to monitor and adjust as indicated.   Last UDS was Performed  on 04/22/2023, it was consistent.    Pain Inventory Average Pain 8 Pain Right Now 8 My pain is intermittent, sharp, burning, stabbing, tingling, and aching  In the last 24 hours, has pain interfered with the following? General activity 9 Relation with others 9 Enjoyment of life 9 What TIME of day is your pain at its worst? morning , evening, and night Sleep (in general) Fair  Pain is worse with: walking, bending, sitting, inactivity, standing, and some activites Pain improves with: rest, heat/ice, and medication Relief from Meds: 7  Family History  Problem Relation Age of Onset   Diabetes type II Mother    Colon polyps Mother    Diverticulitis Mother    Diabetes Mother    Heart disease Mother    Osteoporosis Father    Diabetes type II Brother    Uterine cancer Other    Ovarian cancer Other    COPD Paternal Grandfather    Heart attack Maternal Grandmother    Social History   Socioeconomic History   Marital status: Married    Spouse name: Not on file    Number of children: 2   Years of education: Not on file   Highest education level: Not on file  Occupational History   Occupation: Disability    Employer: UNEMPLOYED  Tobacco Use   Smoking status: Former    Current packs/day: 0.00    Types: Cigarettes    Quit date: 06/30/2011    Years since quitting: 12.0   Smokeless tobacco: Never  Vaping Use   Vaping status: Some Days   Substances: Nicotine, Flavoring  Substance and Sexual Activity   Alcohol use: No    Alcohol/week: 0.0 standard drinks of alcohol   Drug use: No   Sexual activity: Yes  Other Topics Concern   Not on file  Social History Narrative   Not on file   Social Determinants of Health   Financial Resource Strain: High Risk (06/10/2023)   Overall Financial Resource Strain (CARDIA)    Difficulty of Paying Living Expenses: Very hard  Food Insecurity: Food Insecurity Present (06/10/2023)   Hunger Vital Sign    Worried About Running Out of Food in the Last Year: Sometimes true    Ran Out of Food in the Last Year: Never true  Transportation Needs: No Transportation Needs (06/10/2023)   PRAPARE - Administrator, Civil Service (Medical): No    Lack of Transportation (Non-Medical): No  Physical Activity: Inactive (06/10/2023)   Exercise Vital Sign    Days of Exercise per Week: 7 days  Minutes of Exercise per Session: 0 min  Stress: Stress Concern Present (06/10/2023)   [employer] of Occupational Health - Occupational Stress Questionnaire    Feeling of Stress : Rather much  Social Connections: Moderately Isolated (06/10/2023)   Social Connection and Isolation Panel [NHANES]    Frequency of Communication with Friends and Family: Three times a week    Frequency of Social Gatherings with Friends and Family: Once a week    Attends Religious Services: Never    Database administrator or Organizations: No    Attends Engineer, structural: Never    Marital Status: Married   Past Surgical History:   Procedure Laterality Date   BREAST BIOPSY Right 12/31/2016   FIBROCYSTIC CHANGES WITH CALCIFICATIONS/PSEUDOANGIOMATOUS STROMAL HYPERPLASIA (PASH)   CESAREAN SECTION     Left elbow surgery     PARTIAL HYSTERECTOMY     TONSILLECTOMY     Past Surgical History:  Procedure Laterality Date   BREAST BIOPSY Right 12/31/2016   FIBROCYSTIC CHANGES WITH CALCIFICATIONS/PSEUDOANGIOMATOUS STROMAL HYPERPLASIA (PASH)   CESAREAN SECTION     Left elbow surgery     PARTIAL HYSTERECTOMY     TONSILLECTOMY     Past Medical History:  Diagnosis Date   Anxiety    Chronic pain    Dr. Wynn Banker   Degenerative disc disease    Spinal, small syrinx   Depression    Esophageal stricture    Status post dilatation   Fibromyalgia    GERD (gastroesophageal reflux disease)    Hiatal hernia    Internal hemorrhoids without mention of complication    Irritable bowel syndrome    Palpitations    No documented arrhythmias   Personal history of colonic polyps 10/04/2000   HYPERPLASTIC POLYP   POTS (postural orthostatic tachycardia syndrome)    Syncope    Negative tilt table test   Syringomyelia and syringobulbia (HCC)    BP 108/75   Pulse 93   Ht 5' 5.5" (1.664 m)   Wt 163 lb (73.9 kg)   SpO2 99%   BMI 26.71 kg/m   Opioid Risk Score:   Fall Risk Score:  `1  Depression screen PHQ 2/9     06/10/2023    3:30 PM 06/01/2023    2:51 PM 04/22/2023    1:56 PM 01/18/2023    3:48 PM 12/18/2022    2:53 PM 11/18/2022    3:03 PM 09/30/2022    2:36 PM  Depression screen PHQ 2/9  Decreased Interest 3 0 1 1 1 1 1   Down, Depressed, Hopeless 3 0 1 1 1 1 1   PHQ - 2 Score 6 0 2 2 2 2 2   Altered sleeping 1        Tired, decreased energy 3        Change in appetite 0        Feeling bad or failure about yourself  3        Trouble concentrating 1        Moving slowly or fidgety/restless 1        Suicidal thoughts 0        PHQ-9 Score 15        Difficult doing work/chores Very difficult          Review of Systems   All other systems reviewed and are negative.     Objective:   Physical Exam Vitals and nursing note reviewed.  Constitutional:      Appearance: Normal appearance. She  is obese.  Neck:     Comments: Cervical Paraspinal Tenderness: C-4- C-6  Cardiovascular:     Rate and Rhythm: Normal rate and regular rhythm.     Pulses: Normal pulses.     Heart sounds: Normal heart sounds.  Pulmonary:     Effort: Pulmonary effort is normal.     Breath sounds: Normal breath sounds.  Musculoskeletal:     Comments: Normal Muscle Bulk and Muscle Testing Reveals:  Upper Extremities: Full ROM and Muscle Strength 5/5 Bilateral AC Joint Tenderness  Thoracic paraspinal Tenderness: T-6-T-8 Mainly Right Side  Lumbar Paraspinal Tenderness: L-3-L-5 Lower Extremities: Full ROM and Muscle Strength 5/5 Arises from Table slowly Narrow Based  Gait     Skin:    General: Skin is warm and dry.  Neurological:     Mental Status: She is alert and oriented to person, place, and time.  Psychiatric:        Mood and Affect: Mood normal.        Behavior: Behavior normal.          Assessment & Plan:  1. Lumbar degenerative disc disease: 06/28/2024. Continue with current treatment regimen: Refilled : HYDROcodone 7.5/325 mg two tablets every 8 hours as needed #180. We will continue the opioid monitoring program, this consists of regular clinic visits, examinations, urine drug screen, pill counts as well as use of [place] Controlled Substance Reporting system. A 12 month History has been reviewed on the [place] Controlled Substance Reporting System on 06/29/2023 2. Thoracic Spondylosis: Continue current treatment with HEP and  current medication regime. 06/29/2023. 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 Continue to Monitor. 06/29/2023.  Lyrica discontinued due to adverse effects. Continue with exercise and heat therapy. 06/29/2023 4.  Fibromyalgia:Continue with Heat and  exercise Regimen. Continue Gabapentin. 06/29/2023  5.Depression and anxiety: PCP Following. 06/29/2023 6. Orthostatic hypotension: Cardiology Following. 06/29/2023 7. Polyarthralgia: Continue HEP as tolerated . Continue to monitor. 11/262024 8. Chronic Bilateral  Shoulder Pain:  Continue current medication regime. Continue HEP as Tolerated. 11/26//2024. 9. Muscle Spasm:No complaints today.  Continue to monitor.  06/29/2023. 10. Neuropathic Pain: Continue Gabapentin. Continue to Monitor.  06/29/2023  11. Right elbow Pain: No complaints today. Continue current medication regimen. Continue to monitor. 06/29/2023 12. Right  Knee with OA: Continue HEP as Tolerated. Continue current medication regimen. Continue to monitor. 06/29/2023  13. Right Hip Pain: No complaints today. Continue with HEP as Tolerated. Continue to Monitor. 06/29/2023   F/U in 1 Month

## 2023-07-13 ENCOUNTER — Telehealth: Payer: Self-pay | Admitting: *Deleted

## 2023-07-13 MED ORDER — CLENPIQ 10-3.5-12 MG-GM -GM/175ML PO SOLN: 1.0000 | ORAL | 0 refills | Status: DC

## 2023-07-13 NOTE — Telephone Encounter (Signed)
Called pt. She has been scheduled for TCS/EGD/DIL with Dr. Marletta Lor, ASA 3 08/30/23. Aware will call back with pre-op appt. Instructions to be sent to mychart. Rx for prep sent to [facility]. Aware to hold eliquis x 2 days prior. Reports insurance will not change for 2025.

## 2023-08-02 ENCOUNTER — Encounter: Payer: Medicare Other | Attending: Physical Medicine & Rehabilitation | Admitting: Registered Nurse

## 2023-08-02 VITALS — BP 109/76 | HR 86 | Ht 65.5 in | Wt 168.0 lb

## 2023-08-02 DIAGNOSIS — Z5181 Encounter for therapeutic drug level monitoring: Secondary | ICD-10-CM

## 2023-08-02 DIAGNOSIS — M542 Cervicalgia: Secondary | ICD-10-CM

## 2023-08-02 DIAGNOSIS — M25512 Pain in left shoulder: Secondary | ICD-10-CM | POA: Diagnosis not present

## 2023-08-02 DIAGNOSIS — M2550 Pain in unspecified joint: Secondary | ICD-10-CM | POA: Diagnosis not present

## 2023-08-02 DIAGNOSIS — M25511 Pain in right shoulder: Secondary | ICD-10-CM | POA: Diagnosis not present

## 2023-08-02 DIAGNOSIS — G894 Chronic pain syndrome: Secondary | ICD-10-CM | POA: Diagnosis not present

## 2023-08-02 DIAGNOSIS — M797 Fibromyalgia: Secondary | ICD-10-CM

## 2023-08-02 DIAGNOSIS — M792 Neuralgia and neuritis, unspecified: Secondary | ICD-10-CM | POA: Diagnosis not present

## 2023-08-02 DIAGNOSIS — M5412 Radiculopathy, cervical region: Secondary | ICD-10-CM | POA: Diagnosis not present

## 2023-08-02 DIAGNOSIS — G5793 Unspecified mononeuropathy of bilateral lower limbs: Secondary | ICD-10-CM | POA: Diagnosis not present

## 2023-08-02 DIAGNOSIS — Z79891 Long term (current) use of opiate analgesic: Secondary | ICD-10-CM

## 2023-08-02 DIAGNOSIS — M546 Pain in thoracic spine: Secondary | ICD-10-CM | POA: Insufficient documentation

## 2023-08-02 DIAGNOSIS — M5416 Radiculopathy, lumbar region: Secondary | ICD-10-CM

## 2023-08-02 DIAGNOSIS — G8929 Other chronic pain: Secondary | ICD-10-CM | POA: Diagnosis not present

## 2023-08-02 DIAGNOSIS — M545 Low back pain, unspecified: Secondary | ICD-10-CM | POA: Insufficient documentation

## 2023-08-02 MED ORDER — METHYLPREDNISOLONE 4 MG PO TBPK: ORAL_TABLET | ORAL | 0 refills | Status: DC

## 2023-08-02 MED ORDER — HYDROCODONE-ACETAMINOPHEN 7.5-325 MG PO TABS: 2.0000 | ORAL_TABLET | Freq: Three times a day (TID) | ORAL | 0 refills | Status: DC | PRN

## 2023-08-02 NOTE — Progress Notes (Unsigned)
Subjective:    Patient ID: [name], female    DOB: 08/10/1975, 47 y.o.   MRN: [id number]  HPI: [name] is a 47 y.o. female who returns for follow up appointment for chronic pain and medication refill. states *** pain is located in  ***. rates pain ***. current exercise regime is walking and performing stretching exercises.  [name] Morphine equivalent is 45.00 MME. She is also prescribed Clonazepam  by Dr. [name] .We have discussed the black box warning of using opioids and benzodiazepines. I highlighted the dangers of using these drugs together and discussed the adverse events including respiratory suppression, overdose, cognitive impairment and importance of compliance with current regimen. We will continue to monitor and adjust as indicated.   Last UDS was Performed on 04/22/2023, it was consistent.      Pain Inventory Average Pain 7 Pain Right Now 6 My pain is intermittent, constant, sharp, burning, dull, stabbing, tingling, and aching  In the last 24 hours, has pain interfered with the following? General activity 7 Relation with others 7 Enjoyment of life 7 What TIME of day is your pain at its worst? morning , evening, and night Sleep (in general) Fair  Pain is worse with: walking, bending, sitting, inactivity, standing, and some activites Pain improves with: rest, heat/ice, and medication Relief from Meds: 5  Family History  Problem Relation Age of Onset   Diabetes type II Mother    Colon polyps Mother    Diverticulitis Mother    Diabetes Mother    Heart disease Mother    Osteoporosis Father    Diabetes type II Brother    Uterine cancer Other    Ovarian cancer Other    COPD Paternal Grandfather    Heart attack Maternal Grandmother    Social History   Socioeconomic History   Marital status: Married    Spouse name: Not on file   Number of children: 2   Years of education: Not on file   Highest education level: Not on file  Occupational History    Occupation: Disability    Employer: UNEMPLOYED  Tobacco Use   Smoking status: Former    Current packs/day: 0.00    Types: Cigarettes    Quit date: 06/30/2011    Years since quitting: 12.0   Smokeless tobacco: Never  Vaping Use   Vaping status: Some Days   Substances: Nicotine, Flavoring  Substance and Sexual Activity   Alcohol use: No    Alcohol/week: 0.0 standard drinks of alcohol   Drug use: No   Sexual activity: Yes  Other Topics Concern   Not on file  Social History Narrative   Not on file   Social Drivers of Health   Financial Resource Strain: High Risk (06/10/2023)   Overall Financial Resource Strain (CARDIA)    Difficulty of Paying Living Expenses: Very hard  Food Insecurity: Food Insecurity Present (06/10/2023)   Hunger Vital Sign    Worried About Running Out of Food in the Last Year: Sometimes true    Ran Out of Food in the Last Year: Never true  Transportation Needs: No Transportation Needs (06/10/2023)   PRAPARE - Administrator, Civil Service (Medical): No    Lack of Transportation (Non-Medical): No  Physical Activity: Inactive (06/10/2023)   Exercise Vital Sign    Days of Exercise per Week: 7 days    Minutes of Exercise per Session: 0 min  Stress: Stress Concern Present (06/10/2023)   [employer]  of Occupational Health - Occupational Stress Questionnaire    Feeling of Stress : Rather much  Social Connections: Moderately Isolated (06/10/2023)   Social Connection and Isolation Panel [NHANES]    Frequency of Communication with Friends and Family: Three times a week    Frequency of Social Gatherings with Friends and Family: Once a week    Attends Religious Services: Never    Database administrator or Organizations: No    Attends Engineer, structural: Never    Marital Status: Married   Past Surgical History:  Procedure Laterality Date   BREAST BIOPSY Right 12/31/2016   FIBROCYSTIC CHANGES WITH CALCIFICATIONS/PSEUDOANGIOMATOUS  STROMAL HYPERPLASIA (PASH)   CESAREAN SECTION     Left elbow surgery     PARTIAL HYSTERECTOMY     TONSILLECTOMY     Past Surgical History:  Procedure Laterality Date   BREAST BIOPSY Right 12/31/2016   FIBROCYSTIC CHANGES WITH CALCIFICATIONS/PSEUDOANGIOMATOUS STROMAL HYPERPLASIA (PASH)   CESAREAN SECTION     Left elbow surgery     PARTIAL HYSTERECTOMY     TONSILLECTOMY     Past Medical History:  Diagnosis Date   Anxiety    Chronic pain    Dr. Wynn Banker   Degenerative disc disease    Spinal, small syrinx   Depression    Esophageal stricture    Status post dilatation   Fibromyalgia    GERD (gastroesophageal reflux disease)    Hiatal hernia    Internal hemorrhoids without mention of complication    Irritable bowel syndrome    Palpitations    No documented arrhythmias   Personal history of colonic polyps 10/04/2000   HYPERPLASTIC POLYP   POTS (postural orthostatic tachycardia syndrome)    Syncope    Negative tilt table test   Syringomyelia and syringobulbia (HCC)    BP 109/76   Pulse 86   Ht 5' 5.5" (1.664 m)   Wt 168 lb (76.2 kg)   SpO2 99%   BMI 27.53 kg/m   Opioid Risk Score:   Fall Risk Score:  `1  Depression screen PHQ 2/9     06/10/2023    3:30 PM 06/01/2023    2:51 PM 04/22/2023    1:56 PM 01/18/2023    3:48 PM 12/18/2022    2:53 PM 11/18/2022    3:03 PM 09/30/2022    2:36 PM  Depression screen PHQ 2/9  Decreased Interest 3 0 1 1 1 1 1   Down, Depressed, Hopeless 3 0 1 1 1 1 1   PHQ - 2 Score 6 0 2 2 2 2 2   Altered sleeping 1        Tired, decreased energy 3        Change in appetite 0        Feeling bad or failure about yourself  3        Trouble concentrating 1        Moving slowly or fidgety/restless 1        Suicidal thoughts 0        PHQ-9 Score 15        Difficult doing work/chores Very difficult           Review of Systems  Musculoskeletal:  Positive for back pain and neck pain.       Right shoulder pain Right hand pain Right leg  pain Bilateral foot pain  All other systems reviewed and are negative.     Objective:   Physical Exam  Assessment & Plan:

## 2023-08-03 ENCOUNTER — Ambulatory Visit: Payer: Medicare Other | Admitting: Registered Nurse

## 2023-08-16 ENCOUNTER — Telehealth: Payer: Self-pay | Admitting: Registered Nurse

## 2023-08-16 ENCOUNTER — Other Ambulatory Visit: Payer: Self-pay | Admitting: Registered Nurse

## 2023-08-16 MED ORDER — GABAPENTIN 100 MG PO CAPS: 100.0000 mg | ORAL_CAPSULE | Freq: Four times a day (QID) | ORAL | 2 refills | Status: DC

## 2023-08-16 NOTE — Telephone Encounter (Signed)
 PMP was Reviewed.  Gabapentin e-scribed to pharmacy.  Call placed to Ms. Rochford, no answer, left message to return the call.

## 2023-08-24 NOTE — Patient Instructions (Signed)
[name] [facility]  08/24/2023     @PREFPERIOPPHARMACY @   Your procedure is scheduled on  08/30/2023.   [facility] at  [phone]  A.M.   Call this number if you have problems the morning of surgery:  [phone]  If you experience any cold or flu symptoms such as cough, fever, chills, shortness of breath, etc. between now and your scheduled surgery, please notify us at the above number.   Remember:        Your last dose of eliquis should be on 08/27/2023.   Follow the diet and prep instructions given to you by the office.    You may drink clear liquids until 0400 am on 08/30/2023.    Clear liquids allowed are:                    Water, Juice (No red color; non-citric and without pulp; diabetics please choose diet or no sugar options), Carbonated beverages (diabetics please choose diet or no sugar options), Clear Tea (No creamer, milk, or cream, including half & half and powdered creamer), Black Coffee Only (No creamer, milk or cream, including half & half and powdered creamer), and Clear Sports drink (No red color; diabetics please choose diet or no sugar options)    Take these medicines the morning of surgery with A SIP OF WATER           fioricet or hydrocodone (if needed), clonazepam, gabapentin, methocarbamol(if needed).     Do not wear jewelry, make-up or nail polish, including gel polish,  artificial nails, or any other type of covering on natural nails (fingers and  toes).  Do not wear lotions, powders, or perfumes, or deodorant.  Do not shave 48 hours prior to surgery.  Men may shave face and neck.  Do not bring valuables to the hospital.  [facility] is not responsible for any belongings or valuables.  Contacts, dentures or bridgework may not be worn into surgery.  Leave your suitcase in the car.  After surgery it may be brought to your room.  For patients admitted to the hospital, discharge time will be determined by your treatment team.  Patients  discharged the day of surgery will not be allowed to drive home and must have someone with them for 24 hours.    Special instructions:   DO NOT smoke tobacco or vape for 24 hours before your procedure.  Please read over the following fact sheets that you were given. Anesthesia Post-op Instructions and Care and Recovery After Surgery      Upper Endoscopy, Adult, Care After After the procedure, it is common to have a sore throat. It is also common to have: Mild stomach pain or discomfort. Bloating. Nausea. Follow these instructions at home: The instructions below may help you care for yourself at home. Your health care provider may give you more instructions. If you have questions, ask your health care provider. If you were given a sedative during the procedure, it can affect you for several hours. Do not drive or operate machinery until your health care provider says that it is safe. If you will be going home right after the procedure, plan to have a responsible adult: Take you home from the hospital or clinic. You will not be allowed to drive. Care for you for the time you are told. Follow instructions from your health care provider about what you may eat and drink. Return to  your normal activities as told by your health care provider. Ask your health care provider what activities are safe for you. Take over-the-counter and prescription medicines only as told by your health care provider. Contact a health care provider if you: Have a sore throat that lasts longer than one day. Have trouble swallowing. Have a fever. Get help right away if you: Vomit blood or your vomit looks like coffee grounds. Have bloody, black, or tarry stools. Have a very bad sore throat or you cannot swallow. Have difficulty breathing or very bad pain in your chest or abdomen. These symptoms may be an emergency. Get help right away. Call 911. Do not wait to see if the symptoms will go away. Do not drive  yourself to the hospital. Summary After the procedure, it is common to have a sore throat, mild stomach discomfort, bloating, and nausea. If you were given a sedative during the procedure, it can affect you for several hours. Do not drive until your health care provider says that it is safe. Follow instructions from your health care provider about what you may eat and drink. Return to your normal activities as told by your health care provider. This information is not intended to replace advice given to you by your health care provider. Make sure you discuss any questions you have with your health care provider. Document Revised: 10/29/2021 Document Reviewed: 10/29/2021 Elsevier Patient Education  2024 Elsevier Inc.Colonoscopy, Adult, Care After The following information offers guidance on how to care for yourself after your procedure. Your health care provider may also give you more specific instructions. If you have problems or questions, contact your health care provider. What can I expect after the procedure? After the procedure, it is common to have: A small amount of blood in your stool for 24 hours after the procedure. Some gas. Mild cramping or bloating of your abdomen. Follow these instructions at home: Eating and drinking  Drink enough fluid to keep your urine pale yellow. Follow instructions from your health care provider about eating or drinking restrictions. Resume your normal diet as told by your health care provider. Avoid heavy or fried foods that are hard to digest. Activity Rest as told by your health care provider. Avoid sitting for a long time without moving. Get up to take short walks every 1-2 hours. This is important to improve blood flow and breathing. Ask for help if you feel weak or unsteady. Return to your normal activities as told by your health care provider. Ask your health care provider what activities are safe for you. Managing cramping and bloating  Try  walking around when you have cramps or feel bloated. If directed, apply heat to your abdomen as told by your health care provider. Use the heat source that your health care provider recommends, such as a moist heat pack or a heating pad. Place a towel between your skin and the heat source. Leave the heat on for 20-30 minutes. Remove the heat if your skin turns bright red. This is especially important if you are unable to feel pain, heat, or cold. You have a greater risk of getting burned. General instructions If you were given a sedative during the procedure, it can affect you for several hours. Do not drive or operate machinery until your health care provider says that it is safe. For the first 24 hours after the procedure: Do not sign important documents. Do not drink alcohol. Do your regular daily activities at a slower pace than  normal. Eat soft foods that are easy to digest. Take over-the-counter and prescription medicines only as told by your health care provider. Keep all follow-up visits. This is important. Contact a health care provider if: You have blood in your stool 2-3 days after the procedure. Get help right away if: You have more than a small spotting of blood in your stool. You have large blood clots in your stool. You have swelling of your abdomen. You have nausea or vomiting. You have a fever. You have increasing pain in your abdomen that is not relieved with medicine. These symptoms may be an emergency. Get help right away. Call 911. Do not wait to see if the symptoms will go away. Do not drive yourself to the hospital. Summary After the procedure, it is common to have a small amount of blood in your stool. You may also have mild cramping and bloating of your abdomen. If you were given a sedative during the procedure, it can affect you for several hours. Do not drive or operate machinery until your health care provider says that it is safe. Get help right away if you  have a lot of blood in your stool, nausea or vomiting, a fever, or increased pain in your abdomen. This information is not intended to replace advice given to you by your health care provider. Make sure you discuss any questions you have with your health care provider. Document Revised: 09/01/2022 Document Reviewed: 03/12/2021 Elsevier Patient Education  2024 Elsevier Inc.Monitored Anesthesia Care, Care After The following information offers guidance on how to care for yourself after your procedure. Your health care provider may also give you more specific instructions. If you have problems or questions, contact your health care provider. What can I expect after the procedure? After the procedure, it is common to have: Tiredness. Little or no memory about what happened during or after the procedure. Impaired judgment when it comes to making decisions. Nausea or vomiting. Some trouble with balance. Follow these instructions at home: For the time period you were told by your health care provider:  Rest. Do not participate in activities where you could fall or become injured. Do not drive or use machinery. Do not drink alcohol. Do not take sleeping pills or medicines that cause drowsiness. Do not make important decisions or sign legal documents. Do not take care of children on your own. Medicines Take over-the-counter and prescription medicines only as told by your health care provider. If you were prescribed antibiotics, take them as told by your health care provider. Do not stop using the antibiotic even if you start to feel better. Eating and drinking Follow instructions from your health care provider about what you may eat and drink. Drink enough fluid to keep your urine pale yellow. If you vomit: Drink clear fluids slowly and in small amounts as you are able. Clear fluids include water, ice chips, low-calorie sports drinks, and fruit juice that has water added to it (diluted fruit  juice). Eat light and bland foods in small amounts as you are able. These foods include bananas, applesauce, rice, lean meats, toast, and crackers. General instructions  Have a responsible adult stay with you for the time you are told. It is important to have someone help care for you until you are awake and alert. If you have sleep apnea, surgery and some medicines can increase your risk for breathing problems. Follow instructions from your health care provider about wearing your sleep device: When you are sleeping.  This includes during daytime naps. While taking prescription pain medicines, sleeping medicines, or medicines that make you drowsy. Do not use any products that contain nicotine or tobacco. These products include cigarettes, chewing tobacco, and vaping devices, such as e-cigarettes. If you need help quitting, ask your health care provider. Contact a health care provider if: You feel nauseous or vomit every time you eat or drink. You feel light-headed. You are still sleepy or having trouble with balance after 24 hours. You get a rash. You have a fever. You have redness or swelling around the IV site. Get help right away if: You have trouble breathing. You have new confusion after you get home. These symptoms may be an emergency. Get help right away. Call 911. Do not wait to see if the symptoms will go away. Do not drive yourself to the hospital. This information is not intended to replace advice given to you by your health care provider. Make sure you discuss any questions you have with your health care provider. Document Revised: 12/15/2021 Document Reviewed: 12/15/2021 Elsevier Patient Education  2024 [employer].

## 2023-08-25 ENCOUNTER — Encounter (HOSPITAL_COMMUNITY)
Admission: RE | Admit: 2023-08-25 | Discharge: 2023-08-25 | Disposition: A | Discharge status: Home / Self Care | Payer: Medicare Other | Source: Ambulatory Visit | Attending: Internal Medicine | Admitting: Internal Medicine

## 2023-08-25 ENCOUNTER — Encounter (HOSPITAL_COMMUNITY): Payer: Self-pay

## 2023-08-25 VITALS — BP 104/72 | HR 87 | Temp 97.8°F | Resp 18 | Ht 65.5 in | Wt 168.0 lb

## 2023-08-25 DIAGNOSIS — D649 Anemia, unspecified: Secondary | ICD-10-CM | POA: Diagnosis not present

## 2023-08-25 DIAGNOSIS — Z01812 Encounter for preprocedural laboratory examination: Secondary | ICD-10-CM | POA: Insufficient documentation

## 2023-08-25 LAB — CBC WITH DIFFERENTIAL/PLATELET
Abs Immature Granulocytes: 0.01 10*3/uL (ref 0.00–0.07)
Basophils Absolute: 0 10*3/uL (ref 0.0–0.1)
Basophils Relative: 1 %
Eosinophils Absolute: 0.1 10*3/uL (ref 0.0–0.5)
Eosinophils Relative: 1 %
HCT: 37.7 % (ref 36.0–46.0)
Hemoglobin: 12.4 g/dL (ref 12.0–15.0)
Immature Granulocytes: 0 %
Lymphocytes Relative: 46 %
Lymphs Abs: 1.8 10*3/uL (ref 0.7–4.0)
MCH: 32.5 pg (ref 26.0–34.0)
MCHC: 32.9 g/dL (ref 30.0–36.0)
MCV: 98.7 fL (ref 80.0–100.0)
Monocytes Absolute: 0.3 10*3/uL (ref 0.1–1.0)
Monocytes Relative: 8 %
Neutro Abs: 1.7 10*3/uL (ref 1.7–7.7)
Neutrophils Relative %: 44 %
Platelets: 276 10*3/uL (ref 150–400)
RBC: 3.82 MIL/uL — ABNORMAL LOW (ref 3.87–5.11)
RDW: 13.2 % (ref 11.5–15.5)
WBC: 3.9 10*3/uL — ABNORMAL LOW (ref 4.0–10.5)
nRBC: 0 % (ref 0.0–0.2)

## 2023-08-30 ENCOUNTER — Ambulatory Visit (HOSPITAL_COMMUNITY): Payer: Medicare Other | Admitting: Anesthesiology

## 2023-08-30 ENCOUNTER — Other Ambulatory Visit: Payer: Self-pay

## 2023-08-30 ENCOUNTER — Encounter (HOSPITAL_COMMUNITY): Payer: Self-pay | Admitting: Internal Medicine

## 2023-08-30 ENCOUNTER — Encounter (HOSPITAL_COMMUNITY)
Admission: RE | Disposition: A | Discharge status: Home / Self Care | Payer: Self-pay | Source: Home / Self Care | Attending: Internal Medicine

## 2023-08-30 ENCOUNTER — Ambulatory Visit (HOSPITAL_COMMUNITY)
Admission: RE | Admit: 2023-08-30 | Discharge: 2023-08-30 | Disposition: A | Discharge status: Home / Self Care | Payer: Medicare Other | Attending: Internal Medicine | Admitting: Internal Medicine

## 2023-08-30 DIAGNOSIS — K219 Gastro-esophageal reflux disease without esophagitis: Secondary | ICD-10-CM | POA: Diagnosis not present

## 2023-08-30 DIAGNOSIS — K635 Polyp of colon: Secondary | ICD-10-CM | POA: Diagnosis not present

## 2023-08-30 DIAGNOSIS — D125 Benign neoplasm of sigmoid colon: Secondary | ICD-10-CM

## 2023-08-30 DIAGNOSIS — Z7901 Long term (current) use of anticoagulants: Secondary | ICD-10-CM | POA: Insufficient documentation

## 2023-08-30 DIAGNOSIS — Z1211 Encounter for screening for malignant neoplasm of colon: Secondary | ICD-10-CM | POA: Insufficient documentation

## 2023-08-30 DIAGNOSIS — K2970 Gastritis, unspecified, without bleeding: Secondary | ICD-10-CM

## 2023-08-30 DIAGNOSIS — K648 Other hemorrhoids: Secondary | ICD-10-CM | POA: Insufficient documentation

## 2023-08-30 DIAGNOSIS — R1310 Dysphagia, unspecified: Secondary | ICD-10-CM | POA: Diagnosis not present

## 2023-08-30 DIAGNOSIS — K2950 Unspecified chronic gastritis without bleeding: Secondary | ICD-10-CM | POA: Insufficient documentation

## 2023-08-30 DIAGNOSIS — Z83719 Family history of colon polyps, unspecified: Secondary | ICD-10-CM | POA: Insufficient documentation

## 2023-08-30 DIAGNOSIS — K589 Irritable bowel syndrome without diarrhea: Secondary | ICD-10-CM | POA: Insufficient documentation

## 2023-08-30 DIAGNOSIS — D122 Benign neoplasm of ascending colon: Secondary | ICD-10-CM | POA: Diagnosis not present

## 2023-08-30 DIAGNOSIS — F17290 Nicotine dependence, other tobacco product, uncomplicated: Secondary | ICD-10-CM | POA: Insufficient documentation

## 2023-08-30 DIAGNOSIS — M797 Fibromyalgia: Secondary | ICD-10-CM | POA: Insufficient documentation

## 2023-08-30 DIAGNOSIS — D649 Anemia, unspecified: Secondary | ICD-10-CM | POA: Diagnosis not present

## 2023-08-30 DIAGNOSIS — Z86711 Personal history of pulmonary embolism: Secondary | ICD-10-CM | POA: Diagnosis not present

## 2023-08-30 HISTORY — PX: POLYPECTOMY: SHX5525

## 2023-08-30 HISTORY — PX: BALLOON DILATION: SHX5330

## 2023-08-30 HISTORY — PX: COLONOSCOPY WITH PROPOFOL: SHX5780

## 2023-08-30 HISTORY — PX: ESOPHAGOGASTRODUODENOSCOPY (EGD) WITH PROPOFOL: SHX5813

## 2023-08-30 HISTORY — PX: BIOPSY: SHX5522

## 2023-08-30 SURGERY — COLONOSCOPY WITH PROPOFOL
Anesthesia: General

## 2023-08-30 MED ORDER — PROPOFOL 10 MG/ML IV BOLUS
INTRAVENOUS | Status: DC | PRN
  Administered 2023-08-30: 100 mg via INTRAVENOUS

## 2023-08-30 MED ORDER — EPHEDRINE SULFATE-NACL 50-0.9 MG/10ML-% IV SOSY
PREFILLED_SYRINGE | INTRAVENOUS | Status: DC | PRN
  Administered 2023-08-30: 10 mg via INTRAVENOUS

## 2023-08-30 MED ORDER — PROPOFOL 500 MG/50ML IV EMUL
INTRAVENOUS | Status: DC | PRN
  Administered 2023-08-30: 150 ug/kg/min via INTRAVENOUS

## 2023-08-30 MED ORDER — GLYCOPYRROLATE PF 0.2 MG/ML IJ SOSY
PREFILLED_SYRINGE | INTRAMUSCULAR | Status: DC | PRN
  Administered 2023-08-30: .2 mg via INTRAVENOUS

## 2023-08-30 MED ORDER — LIDOCAINE HCL (CARDIAC) PF 100 MG/5ML IV SOSY
PREFILLED_SYRINGE | INTRAVENOUS | Status: DC | PRN
  Administered 2023-08-30: 100 mg via INTRAVENOUS

## 2023-08-30 MED ORDER — DEXMEDETOMIDINE HCL IN NACL 80 MCG/20ML IV SOLN
INTRAVENOUS | Status: AC
  Filled 2023-08-30: qty 20

## 2023-08-30 MED ORDER — LACTATED RINGERS IV SOLN: INTRAVENOUS | Status: DC

## 2023-08-30 MED ORDER — DEXMEDETOMIDINE HCL IN NACL 80 MCG/20ML IV SOLN
INTRAVENOUS | Status: DC | PRN
  Administered 2023-08-30: 20 ug via INTRAVENOUS

## 2023-08-30 MED ORDER — PHENYLEPHRINE 80 MCG/ML (10ML) SYRINGE FOR IV PUSH (FOR BLOOD PRESSURE SUPPORT)
PREFILLED_SYRINGE | INTRAVENOUS | Status: DC | PRN
  Administered 2023-08-30 (×2): 160 ug via INTRAVENOUS

## 2023-08-30 MED ORDER — LIDOCAINE HCL (PF) 2 % IJ SOLN
INTRAMUSCULAR | Status: AC
  Filled 2023-08-30: qty 5

## 2023-08-30 MED ORDER — GLYCOPYRROLATE PF 0.2 MG/ML IJ SOSY
PREFILLED_SYRINGE | INTRAMUSCULAR | Status: AC
  Filled 2023-08-30: qty 1

## 2023-08-30 NOTE — Op Note (Signed)
[facility] Patient Name: [name] Procedure Date: 08/30/2023 8:15 AM MRN: [id number] Date of Birth: October 04, 1975 Attending MD: Hennie Duos. Marletta Lor , [place], [id number] CSN: [id number] Age: 48 Admit Type: Outpatient Procedure:                Colonoscopy Indications:              Screening for colorectal malignant neoplasm Providers:                Hennie Duos. Marletta Lor, DO, Buel Ream. Thomasena Edis RN, RN,                            Lennice Sites Technician, Technician Referring MD:              Medicines:                See the Anesthesia note for documentation of the                            administered medications Complications:            No immediate complications. Estimated Blood Loss:     Estimated blood loss was minimal. Procedure:                Pre-Anesthesia Assessment:                           - The anesthesia plan was to use monitored                            anesthesia care (MAC).                           After obtaining informed consent, the colonoscope                            was passed under direct vision. Throughout the                            procedure, the patient's blood pressure, pulse, and                            oxygen saturations were monitored continuously. The                            [id number]-HQ190L ([id number]) scope was introduced through                            the anus and advanced to the the cecum, identified                            by appendiceal orifice and ileocecal valve. The                            colonoscopy was performed without difficulty. The                            patient tolerated  the procedure well. The quality                            of the bowel preparation was evaluated using the                            BBPS [facility] Bowel Preparation Scale) with scores                            of: Right Colon = 3, Transverse Colon = 3 and Left                            Colon = 3 (entire mucosa seen well with no residual                             staining, small fragments of stool or opaque                            liquid). The total BBPS score equals 9. Scope In: 8:49:06 AM Scope Out: 9:04:22 AM Scope Withdrawal Time: 0 hours 11 minutes 48 seconds  Total Procedure Duration: 0 hours 15 minutes 16 seconds  Findings:      Non-bleeding internal hemorrhoids were found.      A 6 mm polyp was found in the ascending colon. The polyp was       semi-pedunculated. The polyp was removed with a cold snare. Resection       and retrieval were complete.      Two sessile polyps were found in the sigmoid colon and transverse colon.       The polyps were 4 to 6 mm in size. These polyps were removed with a cold       snare. Resection and retrieval were complete.      The exam was otherwise without abnormality. Impression:               - Non-bleeding internal hemorrhoids.                           - One 6 mm polyp in the ascending colon, removed                            with a cold snare. Resected and retrieved.                           - Two 4 to 6 mm polyps in the sigmoid colon and in                            the transverse colon, removed with a cold snare.                            Resected and retrieved.                           - The examination was otherwise normal. Moderate Sedation:      Per Anesthesia Care Recommendation:           -  Patient has a contact number available for                            emergencies. The signs and symptoms of potential                            delayed complications were discussed with the                            patient. Return to normal activities tomorrow.                            Written discharge instructions were provided to the                            patient.                           - Resume previous diet.                           - Continue present medications.                           - Await pathology results.                           - Repeat colonoscopy  in 5-7 years for surveillance                            depending on pathology results.                           - Return to GI clinic in 6 weeks. Procedure Code(s):        --- Professional ---                           [phone], Colonoscopy, flexible; with removal of                            tumor(s), polyp(s), or other lesion(s) by snare                            technique Diagnosis Code(s):        --- Professional ---                           Z12.11, Encounter for screening for malignant                            neoplasm of colon                           D12.2, Benign neoplasm of ascending colon                           D12.5, Benign neoplasm  of sigmoid colon                           D12.3, Benign neoplasm of transverse colon (hepatic                            flexure or splenic flexure)                           K64.8, Other hemorrhoids CPT copyright 2022 American Medical Association. All rights reserved. The codes documented in this report are preliminary and upon coder review may  be revised to meet current compliance requirements. Hennie Duos. Marletta Lor, DO Hennie Duos. Marletta Lor, DO 08/30/2023 9:08:42 AM This report has been signed electronically. Number of Addenda: 0

## 2023-08-30 NOTE — Discharge Instructions (Addendum)
EGD Discharge instructions Please read the instructions outlined below and refer to this sheet in the next few weeks. These discharge instructions provide you with general information on caring for yourself after you leave the hospital. Your doctor may also give you specific instructions. While your treatment has been planned according to the most current medical practices available, unavoidable complications occasionally occur. If you have any problems or questions after discharge, please call your doctor. ACTIVITY You may resume your regular activity but move at a slower pace for the next 24 hours.  Take frequent rest periods for the next 24 hours.  Walking will help expel (get rid of) the air and reduce the bloated feeling in your abdomen.  No driving for 24 hours (because of the anesthesia (medicine) used during the test).  You may shower.  Do not sign any important legal documents or operate any machinery for 24 hours (because of the anesthesia used during the test).  NUTRITION Drink plenty of fluids.  You may resume your normal diet.  Begin with a light meal and progress to your normal diet.  Avoid alcoholic beverages for 24 hours or as instructed by your caregiver.  MEDICATIONS You may resume your normal medications unless your caregiver tells you otherwise.  WHAT YOU CAN EXPECT TODAY You may experience abdominal discomfort such as a feeling of fullness or "gas" pains.  FOLLOW-UP Your doctor will discuss the results of your test with you.  SEEK IMMEDIATE MEDICAL ATTENTION IF ANY OF THE FOLLOWING OCCUR: Excessive nausea (feeling sick to your stomach) and/or vomiting.  Severe abdominal pain and distention (swelling).  Trouble swallowing.  Temperature over 101 F (37.8 C).  Rectal bleeding or vomiting of blood.    Colonoscopy Discharge Instructions  Read the instructions outlined below and refer to this sheet in the next few weeks. These discharge instructions provide you with  general information on caring for yourself after you leave the hospital. Your doctor may also give you specific instructions. While your treatment has been planned according to the most current medical practices available, unavoidable complications occasionally occur.   ACTIVITY You may resume your regular activity, but move at a slower pace for the next 24 hours.  Take frequent rest periods for the next 24 hours.  Walking will help get rid of the air and reduce the bloated feeling in your belly (abdomen).  No driving for 24 hours (because of the medicine (anesthesia) used during the test).   Do not sign any important legal documents or operate any machinery for 24 hours (because of the anesthesia used during the test).  NUTRITION Drink plenty of fluids.  You may resume your normal diet as instructed by your doctor.  Begin with a light meal and progress to your normal diet. Heavy or fried foods are harder to digest and may make you feel sick to your stomach (nauseated).  Avoid alcoholic beverages for 24 hours or as instructed.  MEDICATIONS You may resume your normal medications unless your doctor tells you otherwise.  WHAT YOU CAN EXPECT TODAY Some feelings of bloating in the abdomen.  Passage of more gas than usual.  Spotting of blood in your stool or on the toilet paper.  IF YOU HAD POLYPS REMOVED DURING THE COLONOSCOPY: No aspirin products for 7 days or as instructed.  No alcohol for 7 days or as instructed.  Eat a soft diet for the next 24 hours.  FINDING OUT THE RESULTS OF YOUR TEST Not all test results are available  during your visit. If your test results are not back during the visit, make an appointment with your caregiver to find out the results. Do not assume everything is normal if you have not heard from your caregiver or the medical facility. It is important for you to follow up on all of your test results.  SEEK IMMEDIATE MEDICAL ATTENTION IF: You have more than a spotting of  blood in your stool.  Your belly is swollen (abdominal distention).  You are nauseated or vomiting.  You have a temperature over 101.  You have abdominal pain or discomfort that is severe or gets worse throughout the day.   Your EGD revealed mild amount inflammation in your stomach.  I took biopsies of this to rule out infection with a bacteria called H. pylori.  I stretched your esophagus today.  Hopefully this helps with feeling of food getting stuck.  Small bowel appeared normal.  Continue current medications.  Your colonoscopy revealed 3 polyp(s) which I removed successfully. Await pathology results, my office will contact you. I recommend repeating colonoscopy in 5-7 years for surveillance purposes depending on pathology results.  Follow-up in GI office in 6 weeks.    I hope you have a great rest of your week!  Hennie Duos. Marletta Lor, D.O. Gastroenterology and Hepatology [facility]

## 2023-08-30 NOTE — Transfer of Care (Addendum)
Immediate Anesthesia Transfer of Care Note  Patient: [name]  Procedure(s) Performed: COLONOSCOPY WITH PROPOFOL ESOPHAGOGASTRODUODENOSCOPY (EGD) WITH PROPOFOL BALLOON DILATION BIOPSY POLYPECTOMY  Patient Location: Short Stay  Anesthesia Type:General  Level of Consciousness: awake, drowsy, and patient cooperative  Airway & Oxygen Therapy: Patient Spontanous Breathing and Patient connected to face mask oxygen  Post-op Assessment: Report given to RN and Post -op Vital signs reviewed and stable  Post vital signs: Reviewed and stable  Last Vitals:  Vitals Value Taken Time  BP 127/64 08/30/23   0915  Temp 36.4 08/30/23   0915  Pulse 76 08/30/23   0915  Resp 16 08/30/23   0915  SpO2 100% 08/30/23   0915    Last Pain:  Vitals:   08/30/23 0907  TempSrc: Oral  PainSc: 0-No pain      Patients Stated Pain Goal: 3 (08/30/23 0705)  Complications: No notable events documented.

## 2023-08-30 NOTE — H&P (Signed)
Primary Care Physician:  Elfredia Nevins, MD Primary Gastroenterologist:  Dr. Marletta Lor  Pre-Procedure History & Physical: HPI:  [name] is a 48 y.o. female is here for an EGD with possible dilation due to history of dysphagia, bloating and colonoscopy for colon cancer screening.   Past Medical History:  Diagnosis Date   Anxiety    Chronic pain    Dr. 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 Authorizing Provider  amitriptyline (ELAVIL) 50 MG tablet Take 50 mg by mouth at bedtime.   Yes [provider]  amphetamine-dextroamphetamine (ADDERALL) 20 MG tablet Take 20 mg by mouth 2 (two) times daily. ER 03/27/22  Yes [provider]  butalbital-acetaminophen-caffeine (FIORICET) 50-325-40 MG tablet Take 1 tablet by mouth as needed. 05/20/16  Yes [provider]  ergocalciferol (VITAMIN D2) 1.25 MG (50000 UT) capsule Take 50,000 Units by mouth once a week.   Yes [provider]  gabapentin (NEURONTIN) 100 MG capsule Take 1 capsule (100 mg total) by mouth 4 (four) times daily. 08/16/23  Yes  Jones Bales, NP  HYDROcodone-acetaminophen (NORCO) 7.5-325 MG tablet Take 2 tablets by mouth every 8 (eight) hours as needed for moderate pain (pain score 4-6). 08/02/23  Yes Jones Bales, NP  Sod Picosulfate-Mag Ox-Cit Acd (CLENPIQ) 10-3.5-12 MG-GM -GM/175ML SOLN Take 1 kit by mouth as directed. 07/13/23  Yes Lanelle Bal, DO  traZODone (DESYREL) 50 MG tablet Take 1 tablet (50 mg total) by mouth at bedtime as needed for sleep. 05/12/23  Yes Vassie Loll, MD  apixaban (ELIQUIS) 5 MG TABS tablet Take 2 tablets (10 mg total) by mouth 2 (two) times daily for 7 days, THEN 1 tablet (5 mg total) 2 (two) times daily. 05/12/23 11/15/23  Vassie Loll, MD  clonazePAM (KLONOPIN) 1 MG tablet Take 1 mg by mouth 3 (three) times daily.    [provider]  fluticasone (FLONASE) 50 MCG/ACT nasal spray Place 2 sprays into both nostrils daily. 03/21/21   [provider]  methocarbamol (ROBAXIN) 500 MG tablet Take 500 mg by mouth every 6 (six) hours as needed for muscle spasms.    [provider]  methylPREDNISolone (MEDROL DOSEPAK) 4 MG TBPK tablet Use as directed 08/02/23   Jones Bales, NP  polyethylene glycol (MIRALAX / GLYCOLAX) 17 g packet Take 17 g by mouth daily as needed for mild constipation. 05/12/23   Vassie Loll, MD    Allergies as of 07/13/2023 - Review Complete 06/10/2023  Allergen Reaction Noted   Codeine Other (  See Comments) 12/31/2015    Family History  Problem Relation Age of Onset   Diabetes type II Mother    Colon polyps Mother    Diverticulitis Mother    Diabetes Mother    Heart disease Mother    Osteoporosis Father    Diabetes type II Brother    Uterine cancer Other    Ovarian cancer Other    COPD Paternal Grandfather    Heart attack Maternal Grandmother     Social History   Socioeconomic History   Marital status: Married    Spouse name: Not on file   Number of children: 2   Years of education: Not on file   Highest education  level: Not on file  Occupational History   Occupation: Disability    Employer: UNEMPLOYED  Tobacco Use   Smoking status: Former    Current packs/day: 0.00    Types: Cigarettes    Quit date: 06/30/2011    Years since quitting: 12.1   Smokeless tobacco: Never  Vaping Use   Vaping status: Some Days   Substances: Nicotine, Flavoring  Substance and Sexual Activity   Alcohol use: No    Alcohol/week: 0.0 standard drinks of alcohol   Drug use: No   Sexual activity: Yes  Other Topics Concern   Not on file  Social History Narrative   Not on file   Social Drivers of Health   Financial Resource Strain: High Risk (06/10/2023)   Overall Financial Resource Strain (CARDIA)    Difficulty of Paying Living Expenses: Very hard  Food Insecurity: Food Insecurity Present (06/10/2023)   Hunger Vital Sign    Worried About Running Out of Food in the Last Year: Sometimes true    Ran Out of Food in the Last Year: Never true  Transportation Needs: No Transportation Needs (06/10/2023)   PRAPARE - Administrator, Civil Service (Medical): No    Lack of Transportation (Non-Medical): No  Physical Activity: Inactive (06/10/2023)   Exercise Vital Sign    Days of Exercise per Week: 7 days    Minutes of Exercise per Session: 0 min  Stress: Stress Concern Present (06/10/2023)   [employer] of Occupational Health - Occupational Stress Questionnaire    Feeling of Stress : Rather much  Social Connections: Moderately Isolated (06/10/2023)   Social Connection and Isolation Panel [NHANES]    Frequency of Communication with Friends and Family: Three times a week    Frequency of Social Gatherings with Friends and Family: Once a week    Attends Religious Services: Never    Database administrator or Organizations: No    Attends Banker Meetings: Never    Marital Status: Married  Catering manager Violence: Not At Risk (06/10/2023)   Humiliation, Afraid, Rape, and Kick questionnaire    Fear  of Current or Ex-Partner: No    Emotionally Abused: No    Physically Abused: No    Sexually Abused: No    Review of Systems: General: Negative for fever, chills, fatigue, weakness. Eyes: Negative for vision changes.  ENT: Negative for hoarseness, difficulty swallowing , nasal congestion. CV: Negative for chest pain, angina, palpitations, dyspnea on exertion, peripheral edema.  Respiratory: Negative for dyspnea at rest, dyspnea on exertion, cough, sputum, wheezing.  GI: See history of present illness. GU:  Negative for dysuria, hematuria, urinary incontinence, urinary frequency, nocturnal urination.  MS: Negative for joint pain, low back pain.  Derm: Negative for rash or itching.  Neuro: Negative for  weakness, abnormal sensation, seizure, frequent headaches, memory loss, confusion.  Psych: Negative for anxiety, depression Endo: Negative for unusual weight change.  Heme: Negative for bruising or bleeding. Allergy: Negative for rash or hives.  Physical Exam: Vital signs in last 24 hours: Temp:  [98.1 F (36.7 C)] 98.1 F (36.7 C) (01/27 0705) Pulse Rate:  [88] 88 (01/27 0705) Resp:  [15] 15 (01/27 0705) BP: (117)/(81) 117/81 (01/27 0705) SpO2:  [99 %] 99 % (01/27 0705) Weight:  [76.2 kg] 76.2 kg (01/27 0705)   General:   Alert,  Well-developed, well-nourished, pleasant and cooperative in NAD Head:  Normocephalic and atraumatic. Eyes:  Sclera clear, no icterus.   Conjunctiva pink. Ears:  Normal auditory acuity. Nose:  No deformity, discharge,  or lesions. Msk:  Symmetrical without gross deformities. Normal posture. Extremities:  Without clubbing or edema. Neurologic:  Alert and  oriented x4;  grossly normal neurologically. Skin:  Intact without significant lesions or rashes. Psych:  Alert and cooperative. Normal mood and affect.   Impression/Plan: [name] is here for an EGD with possible dilation due to history of dysphagia, bloating and colonoscopy for colon cancer  screening.   Risks, benefits, limitations, imponderables and alternatives regarding procedure have been reviewed with the patient. Questions have been answered. All parties agreeable.

## 2023-08-30 NOTE — Op Note (Signed)
[facility] Patient Name: [name] Procedure Date: 08/30/2023 8:21 AM MRN: [id number] Date of Birth: 1975/10/21 Attending MD: Hennie Duos. Marletta Lor , [place], [id number] CSN: [id number] Age: 48 Admit Type: Outpatient Procedure:                Upper GI endoscopy Indications:              Dysphagia, Abdominal bloating Providers:                Hennie Duos. Marletta Lor, DO, Buel Ream. Thomasena Edis RN, RN,                            Lennice Sites Technician, Technician Referring MD:              Medicines:                See the Anesthesia note for documentation of the                            administered medications Complications:            No immediate complications. Estimated Blood Loss:     Estimated blood loss was minimal. Procedure:                Pre-Anesthesia Assessment:                           - The anesthesia plan was to use monitored                            anesthesia care (MAC).                           After obtaining informed consent, the endoscope was                            passed under direct vision. Throughout the                            procedure, the patient's blood pressure, pulse, and                            oxygen saturations were monitored continuously. The                            GIF-H190 ([id number]) scope was introduced through the                            mouth, and advanced to the second part of duodenum.                            The upper GI endoscopy was accomplished without                            difficulty. The patient tolerated the procedure                            well.  Scope In: 8:40:35 AM Scope Out: 8:45:33 AM Total Procedure Duration: 0 hours 4 minutes 58 seconds  Findings:      The Z-line was regular.      No endoscopic abnormality was evident in the esophagus to explain the       patient's complaint of dysphagia. Preparations were made for empiric       dilation. A TTS dilator was passed through the scope. Dilation with an        18-19-20 mm balloon dilator was performed to 20 mm. Dilation was       performed with a mild resistance at 20 mm. Estimated blood loss was none.      Patchy mild inflammation characterized by erythema was found in the       gastric body. Biopsies were taken with a cold forceps for Helicobacter       pylori testing.      The duodenal bulb, first portion of the duodenum and second portion of       the duodenum were normal. Impression:               - Z-line regular.                           - Gastritis. Biopsied.                           - Normal duodenal bulb, first portion of the                            duodenum and second portion of the duodenum. Moderate Sedation:      Per Anesthesia Care Recommendation:           - Patient has a contact number available for                            emergencies. The signs and symptoms of potential                            delayed complications were discussed with the                            patient. Return to normal activities tomorrow.                            Written discharge instructions were provided to the                            patient.                           - Resume previous diet.                           - Continue present medications.                           - Await pathology results.                           -  Repeat upper endoscopy PRN for retreatment.                           - Return to GI clinic in 6 weeks. Procedure Code(s):        --- Professional ---                           [phone], Esophagogastroduodenoscopy, flexible,                            transoral; with biopsy, single or multiple Diagnosis Code(s):        --- Professional ---                           K29.70, Gastritis, unspecified, without bleeding                           R13.10, Dysphagia, unspecified                           R14.0, Abdominal distension (gaseous) CPT copyright 2022 American Medical Association. All rights reserved. The  codes documented in this report are preliminary and upon coder review may  be revised to meet current compliance requirements. Hennie Duos. Marletta Lor, DO Hennie Duos. Marletta Lor, DO 08/30/2023 8:47:43 AM This report has been signed electronically. Number of Addenda: 0

## 2023-08-30 NOTE — Anesthesia Preprocedure Evaluation (Signed)
Anesthesia Evaluation  Patient identified by MRN, date of birth, ID band Patient awake    Reviewed: Allergy & Precautions, H&P , NPO status , Patient's Chart, lab work & pertinent test results, reviewed documented beta blocker date and time   Airway Mallampati: II  TM Distance: >3 FB Neck ROM: full    Dental no notable dental hx.    Pulmonary neg pulmonary ROS, former smoker   Pulmonary exam normal breath sounds clear to auscultation       Cardiovascular Exercise Tolerance: Good hypertension, negative cardio ROS  Rhythm:regular Rate:Normal     Neuro/Psych  Headaches PSYCHIATRIC DISORDERS Anxiety Depression     Neuromuscular disease negative neurological ROS  negative psych ROS   GI/Hepatic negative GI ROS, Neg liver ROS, hiatal hernia,GERD  ,,  Endo/Other  negative endocrine ROS    Renal/GU negative Renal ROS  negative genitourinary   Musculoskeletal   Abdominal   Peds  Hematology negative hematology ROS (+) Blood dyscrasia, anemia   Anesthesia Other Findings   Reproductive/Obstetrics negative OB ROS                             Anesthesia Physical Anesthesia Plan  ASA: 2  Anesthesia Plan: General   Post-op Pain Management:    Induction:   PONV Risk Score and Plan: Propofol infusion  Airway Management Planned:   Additional Equipment:   Intra-op Plan:   Post-operative Plan:   Informed Consent: I have reviewed the patients History and Physical, chart, labs and discussed the procedure including the risks, benefits and alternatives for the proposed anesthesia with the patient or authorized representative who has indicated his/her understanding and acceptance.     Dental Advisory Given  Plan Discussed with: CRNA  Anesthesia Plan Comments:        Anesthesia Quick Evaluation

## 2023-08-30 NOTE — Anesthesia Postprocedure Evaluation (Signed)
Anesthesia Post Note  Patient: [name]  Procedure(s) Performed: COLONOSCOPY WITH PROPOFOL ESOPHAGOGASTRODUODENOSCOPY (EGD) WITH PROPOFOL BALLOON DILATION BIOPSY POLYPECTOMY  Patient location during evaluation: Phase II Anesthesia Type: General Level of consciousness: awake Pain management: pain level controlled Vital Signs Assessment: post-procedure vital signs reviewed and stable Respiratory status: spontaneous breathing and respiratory function stable Cardiovascular status: blood pressure returned to baseline and stable Postop Assessment: no headache and no apparent nausea or vomiting Anesthetic complications: no Comments: Late entry   No notable events documented.   Last Vitals:  Vitals:   08/30/23 0705 08/30/23 0907  BP: 117/81 127/64  Pulse: 88 76  Resp: 15 16  Temp: 36.7 C 36.4 C  SpO2: 99% 100%    Last Pain:  Vitals:   08/30/23 0907  TempSrc: Oral  PainSc: 0-No pain                 Windell Norfolk

## 2023-08-31 ENCOUNTER — Encounter (HOSPITAL_COMMUNITY): Payer: Self-pay | Admitting: Internal Medicine

## 2023-08-31 LAB — SURGICAL PATHOLOGY

## 2023-09-03 ENCOUNTER — Encounter: Payer: Medicare Other | Attending: Physical Medicine & Rehabilitation | Admitting: Registered Nurse

## 2023-09-03 VITALS — Ht 65.0 in | Wt 167.0 lb

## 2023-09-03 DIAGNOSIS — M25512 Pain in left shoulder: Secondary | ICD-10-CM

## 2023-09-03 DIAGNOSIS — M5412 Radiculopathy, cervical region: Secondary | ICD-10-CM

## 2023-09-03 DIAGNOSIS — M797 Fibromyalgia: Secondary | ICD-10-CM | POA: Diagnosis not present

## 2023-09-03 DIAGNOSIS — Z79891 Long term (current) use of opiate analgesic: Secondary | ICD-10-CM | POA: Diagnosis not present

## 2023-09-03 DIAGNOSIS — M2550 Pain in unspecified joint: Secondary | ICD-10-CM | POA: Diagnosis not present

## 2023-09-03 DIAGNOSIS — Z5181 Encounter for therapeutic drug level monitoring: Secondary | ICD-10-CM | POA: Diagnosis not present

## 2023-09-03 DIAGNOSIS — G894 Chronic pain syndrome: Secondary | ICD-10-CM

## 2023-09-03 DIAGNOSIS — M25511 Pain in right shoulder: Secondary | ICD-10-CM

## 2023-09-03 DIAGNOSIS — M545 Low back pain, unspecified: Secondary | ICD-10-CM

## 2023-09-03 DIAGNOSIS — M542 Cervicalgia: Secondary | ICD-10-CM

## 2023-09-03 DIAGNOSIS — G8929 Other chronic pain: Secondary | ICD-10-CM | POA: Insufficient documentation

## 2023-09-03 MED ORDER — HYDROCODONE-ACETAMINOPHEN 7.5-325 MG PO TABS: 2.0000 | ORAL_TABLET | Freq: Three times a day (TID) | ORAL | 0 refills | Status: DC | PRN

## 2023-09-03 NOTE — Progress Notes (Signed)
Subjective:    Patient ID: [name], female    DOB: Jun 07, 1976, 48 y.o.   MRN: [id number]  HPI: [name] is a 48 y.o. female who sent a My-Chart message reporting she was exposed to COVID, her appointment was changed to a Virtual Visit. I connected with [name] by a video enabled telemedicine application and verified that I am speaking with the correct person using two identifiers.  Location: Patient: At Her Home  Provider: In The Office    I discussed the limitations of evaluation and management by telemedicine and the availability of in person appointments. The patient expressed understanding and agreed to proceed.  She  states her pain is located in her neck radiating into her bilateral shoulders, lower back and generalized joint pain. She rates her pain 6. Her current exercise regime is walking and performing stretching exercises.  [name] Morphine equivalent is 45.00 MME. She is also prescribed Clonazepam  by Dr. [name] .We have discussed the black box warning of using opioids and benzodiazepines. I highlighted the dangers of using these drugs together and discussed the adverse events including respiratory suppression, overdose, cognitive impairment and importance of compliance with current regimen. We will continue to monitor and adjust as indicated.   Last UDS was Performed on 04/22/2023, it was consistent.    Pain Inventory Average Pain 7 Pain Right Now 6 My pain is intermittent, sharp, burning, dull, stabbing, tingling, and aching  In the last 24 hours, has pain interfered with the following? General activity 8 Relation with others 8 Enjoyment of life 8 What TIME of day is your pain at its worst? morning , evening, and night Sleep (in general) Fair  Pain is worse with: walking, bending, sitting, standing, and some activites Pain improves with: rest and medication Relief from Meds: 5  Family History  Problem Relation Age of Onset   Diabetes type  II Mother    Colon polyps Mother    Diverticulitis Mother    Diabetes Mother    Heart disease Mother    Osteoporosis Father    Diabetes type II Brother    Uterine cancer Other    Ovarian cancer Other    COPD Paternal Grandfather    Heart attack Maternal Grandmother    Social History   Socioeconomic History   Marital status: Married    Spouse name: Not on file   Number of children: 2   Years of education: Not on file   Highest education level: Not on file  Occupational History   Occupation: Disability    Employer: UNEMPLOYED  Tobacco Use   Smoking status: Former    Current packs/day: 0.00    Types: Cigarettes    Quit date: 06/30/2011    Years since quitting: 12.1   Smokeless tobacco: Never  Vaping Use   Vaping status: Some Days   Substances: Nicotine, Flavoring  Substance and Sexual Activity   Alcohol use: No    Alcohol/week: 0.0 standard drinks of alcohol   Drug use: No   Sexual activity: Yes  Other Topics Concern   Not on file  Social History Narrative   Not on file   Social Drivers of Health   Financial Resource Strain: High Risk (06/10/2023)   Overall Financial Resource Strain (CARDIA)    Difficulty of Paying Living Expenses: Very hard  Food Insecurity: Food Insecurity Present (06/10/2023)   Hunger Vital Sign    Worried About Running Out of Food in the Last Year:  Sometimes true    Ran Out of Food in the Last Year: Never true  Transportation Needs: No Transportation Needs (06/10/2023)   PRAPARE - Administrator, Civil Service (Medical): No    Lack of Transportation (Non-Medical): No  Physical Activity: Inactive (06/10/2023)   Exercise Vital Sign    Days of Exercise per Week: 7 days    Minutes of Exercise per Session: 0 min  Stress: Stress Concern Present (06/10/2023)   [employer] of Occupational Health - Occupational Stress Questionnaire    Feeling of Stress : Rather much  Social Connections: Moderately Isolated (06/10/2023)   Social  Connection and Isolation Panel [NHANES]    Frequency of Communication with Friends and Family: Three times a week    Frequency of Social Gatherings with Friends and Family: Once a week    Attends Religious Services: Never    Database administrator or Organizations: No    Attends Banker Meetings: Never    Marital Status: Married   Past Surgical History:  Procedure Laterality Date   BALLOON DILATION N/A 08/30/2023   Procedure: BALLOON DILATION;  Surgeon: Lanelle Bal, DO;  [facility];  Service: Endoscopy;  Laterality: N/A;   BIOPSY  08/30/2023   Procedure: BIOPSY;  Surgeon: Lanelle Bal, DO;  [facility];  Service: Endoscopy;;   BREAST BIOPSY Right 12/31/2016   FIBROCYSTIC CHANGES WITH CALCIFICATIONS/PSEUDOANGIOMATOUS STROMAL HYPERPLASIA (PASH)   CESAREAN SECTION     COLONOSCOPY WITH PROPOFOL N/A 08/30/2023   Procedure: COLONOSCOPY WITH PROPOFOL;  Surgeon: Lanelle Bal, DO;  [facility];  Service: Endoscopy;  Laterality: N/A;  800am, asa 3   ESOPHAGOGASTRODUODENOSCOPY (EGD) WITH PROPOFOL N/A 08/30/2023   Procedure: ESOPHAGOGASTRODUODENOSCOPY (EGD) WITH PROPOFOL;  Surgeon: Lanelle Bal, DO;  [facility];  Service: Endoscopy;  Laterality: N/A;   Left elbow surgery     PARTIAL HYSTERECTOMY     POLYPECTOMY  08/30/2023   Procedure: POLYPECTOMY;  Surgeon: Lanelle Bal, DO;  [facility];  Service: Endoscopy;;   TONSILLECTOMY     Past Surgical History:  Procedure Laterality Date   BALLOON DILATION N/A 08/30/2023   Procedure: BALLOON DILATION;  Surgeon: Lanelle Bal, DO;  [facility];  Service: Endoscopy;  Laterality: N/A;   BIOPSY  08/30/2023   Procedure: BIOPSY;  Surgeon: Lanelle Bal, DO;  [facility];  Service: Endoscopy;;   BREAST BIOPSY Right 12/31/2016   FIBROCYSTIC CHANGES WITH CALCIFICATIONS/PSEUDOANGIOMATOUS STROMAL HYPERPLASIA (PASH)   CESAREAN SECTION      COLONOSCOPY WITH PROPOFOL N/A 08/30/2023   Procedure: COLONOSCOPY WITH PROPOFOL;  Surgeon: Lanelle Bal, DO;  [facility];  Service: Endoscopy;  Laterality: N/A;  800am, asa 3   ESOPHAGOGASTRODUODENOSCOPY (EGD) WITH PROPOFOL N/A 08/30/2023   Procedure: ESOPHAGOGASTRODUODENOSCOPY (EGD) WITH PROPOFOL;  Surgeon: Lanelle Bal, DO;  [facility];  Service: Endoscopy;  Laterality: N/A;   Left elbow surgery     PARTIAL HYSTERECTOMY     POLYPECTOMY  08/30/2023   Procedure: POLYPECTOMY;  Surgeon: Lanelle Bal, DO;  [facility];  Service: Endoscopy;;   TONSILLECTOMY     Past Medical History:  Diagnosis Date   Anxiety    Chronic pain    Dr. Wynn Banker   Degenerative disc disease    Spinal, small syrinx   Depression    Esophageal stricture    Status post dilatation   Fibromyalgia  GERD (gastroesophageal reflux disease)    Hiatal hernia    Internal hemorrhoids without mention of complication    Irritable bowel syndrome    Palpitations    No documented arrhythmias   Personal history of colonic polyps 10/04/2000   HYPERPLASTIC POLYP   POTS (postural orthostatic tachycardia syndrome)    Pulmonary embolus (HCC) 05/04/2023   Syncope    Negative tilt table test   Syringomyelia and syringobulbia (HCC)    There were no vitals taken for this visit.  Opioid Risk Score:   Fall Risk Score:  `1  Depression screen PHQ 2/9     06/10/2023    3:30 PM 06/01/2023    2:51 PM 04/22/2023    1:56 PM 01/18/2023    3:48 PM 12/18/2022    2:53 PM 11/18/2022    3:03 PM 09/30/2022    2:36 PM  Depression screen PHQ 2/9  Decreased Interest 3 0 1 1 1 1 1   Down, Depressed, Hopeless 3 0 1 1 1 1 1   PHQ - 2 Score 6 0 2 2 2 2 2   Altered sleeping 1        Tired, decreased energy 3        Change in appetite 0        Feeling bad or failure about yourself  3        Trouble concentrating 1        Moving slowly or fidgety/restless 1        Suicidal thoughts 0         PHQ-9 Score 15        Difficult doing work/chores Very difficult            Review of Systems  Musculoskeletal:  Positive for back pain, gait problem and neck pain.       B/L shoulder pain   Neurological:  Positive for headaches.  All other systems reviewed and are negative.     Objective:   Physical Exam Vitals and nursing note reviewed.  Musculoskeletal:     Comments: No Physical Exam Performed: Virtual Visit         Assessment & Plan:  1. Lumbar degenerative disc disease: 09/03/2023. Continue with current treatment regimen: Refilled : HYDROcodone 7.5/325 mg two tablets every 8 hours as needed #180. Second script sent for the following month to accommodate scheduled appointment. We will continue the opioid monitoring program, this consists of regular clinic visits, examinations, urine drug screen, pill counts as well as use of [place] Controlled Substance Reporting system. A 12 month History has been reviewed on the [place] Controlled Substance Reporting System on 09/03/2023 2. Thoracic Spondylosis: Continue current treatment with HEP and  current medication regime. 09/03/2023. 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 Continue to Monitor. 09/03/2023.  Lyrica discontinued due to adverse effects. Continue with exercise and heat therapy. 09/03/2023 4. Fibromyalgia:Continue with Heat and  exercise Regimen. Continue Gabapentin. 09/03/2023  5.Depression and anxiety: PCP Following. 09/03/2023 6. Orthostatic hypotension: Cardiology Following. 09/03/2023 7. Polyarthralgia: Continue HEP as tolerated . Continue to monitor. 09/03/2023 8. Chronic Bilateral  Shoulder Pain:  Continue current medication regime. Continue HEP as Tolerated. 01/31//2025. 9. Muscle Spasm:No complaints today.  Continue to monitor.  09/03/2023. 10. Neuropathic Pain: Continue Gabapentin. Continue to Monitor.  09/03/2023  11. Right  elbow Pain: No complaints today. Continue current medication regimen. Continue to monitor. 09/03/2023 12. Right  Knee with OA: No complaints today.Continue HEP as Tolerated. Continue  current medication regimen. Continue to monitor. 09/03/2023  13. Right Hip Pain: No complaints today. Continue with HEP as Tolerated. Continue to Monitor. 09/03/2023   F/U in 1 Month    Virtual Visit Established Patient Location of Patient: In her Home Location of Provider: In the Office

## 2023-09-09 ENCOUNTER — Inpatient Hospital Stay: Payer: Medicare Other | Attending: Oncology

## 2023-09-09 DIAGNOSIS — E611 Iron deficiency: Secondary | ICD-10-CM | POA: Diagnosis not present

## 2023-09-09 DIAGNOSIS — Z79899 Other long term (current) drug therapy: Secondary | ICD-10-CM | POA: Diagnosis not present

## 2023-09-09 DIAGNOSIS — I2694 Multiple subsegmental pulmonary emboli without acute cor pulmonale: Secondary | ICD-10-CM

## 2023-09-09 DIAGNOSIS — D649 Anemia, unspecified: Secondary | ICD-10-CM

## 2023-09-09 DIAGNOSIS — I2699 Other pulmonary embolism without acute cor pulmonale: Secondary | ICD-10-CM | POA: Diagnosis not present

## 2023-09-09 DIAGNOSIS — Z7901 Long term (current) use of anticoagulants: Secondary | ICD-10-CM | POA: Diagnosis not present

## 2023-09-09 LAB — CBC WITH DIFFERENTIAL/PLATELET
Abs Immature Granulocytes: 0.01 10*3/uL (ref 0.00–0.07)
Basophils Absolute: 0 10*3/uL (ref 0.0–0.1)
Basophils Relative: 1 %
Eosinophils Absolute: 0.1 10*3/uL (ref 0.0–0.5)
Eosinophils Relative: 1 %
HCT: 38.4 % (ref 36.0–46.0)
Hemoglobin: 12.3 g/dL (ref 12.0–15.0)
Immature Granulocytes: 0 %
Lymphocytes Relative: 45 %
Lymphs Abs: 2.1 10*3/uL (ref 0.7–4.0)
MCH: 31.5 pg (ref 26.0–34.0)
MCHC: 32 g/dL (ref 30.0–36.0)
MCV: 98.5 fL (ref 80.0–100.0)
Monocytes Absolute: 0.4 10*3/uL (ref 0.1–1.0)
Monocytes Relative: 9 %
Neutro Abs: 2.1 10*3/uL (ref 1.7–7.7)
Neutrophils Relative %: 44 %
Platelets: 302 10*3/uL (ref 150–400)
RBC: 3.9 MIL/uL (ref 3.87–5.11)
RDW: 12.8 % (ref 11.5–15.5)
WBC: 4.7 10*3/uL (ref 4.0–10.5)
nRBC: 0 % (ref 0.0–0.2)

## 2023-09-09 LAB — IRON AND TIBC
Iron: 78 ug/dL (ref 28–170)
Saturation Ratios: 21 % (ref 10.4–31.8)
TIBC: 376 ug/dL (ref 250–450)
UIBC: 298 ug/dL

## 2023-09-09 LAB — D-DIMER, QUANTITATIVE: D-Dimer, Quant: 0.32 ug{FEU}/mL (ref 0.00–0.50)

## 2023-09-09 LAB — FERRITIN: Ferritin: 24 ng/mL (ref 11–307)

## 2023-09-16 ENCOUNTER — Other Ambulatory Visit: Payer: Self-pay

## 2023-09-16 ENCOUNTER — Inpatient Hospital Stay (HOSPITAL_BASED_OUTPATIENT_CLINIC_OR_DEPARTMENT_OTHER): Payer: Medicare Other | Admitting: Oncology

## 2023-09-16 VITALS — BP 109/77 | HR 95 | Temp 99.9°F | Resp 19 | Ht 66.0 in | Wt 166.4 lb

## 2023-09-16 DIAGNOSIS — I2699 Other pulmonary embolism without acute cor pulmonale: Secondary | ICD-10-CM | POA: Diagnosis not present

## 2023-09-16 DIAGNOSIS — Z7901 Long term (current) use of anticoagulants: Secondary | ICD-10-CM | POA: Diagnosis not present

## 2023-09-16 DIAGNOSIS — D649 Anemia, unspecified: Secondary | ICD-10-CM | POA: Diagnosis not present

## 2023-09-16 DIAGNOSIS — F411 Generalized anxiety disorder: Secondary | ICD-10-CM | POA: Diagnosis not present

## 2023-09-16 DIAGNOSIS — E611 Iron deficiency: Secondary | ICD-10-CM | POA: Diagnosis not present

## 2023-09-16 DIAGNOSIS — Z79899 Other long term (current) drug therapy: Secondary | ICD-10-CM | POA: Diagnosis not present

## 2023-09-16 DIAGNOSIS — I2694 Multiple subsegmental pulmonary emboli without acute cor pulmonale: Secondary | ICD-10-CM

## 2023-09-16 MED ORDER — APIXABAN 2.5 MG PO TABS
2.5000 mg | ORAL_TABLET | Freq: Two times a day (BID) | ORAL | 2 refills | Status: AC
Start: 2023-09-16 — End: ?

## 2023-09-16 NOTE — Assessment & Plan Note (Signed)
Anemia resolved. Patient has mild iron deficiency with low ferritin. -Increase intake of protein and green vegetables.

## 2023-09-16 NOTE — Assessment & Plan Note (Signed)
Patient appears distressed and anxious, particularly in relation to her health and the memory of her mother's death from a pulmonary embolism. Discussed the potential benefit of psychological support. -Consider referral to a psychologist or social worker for emotional support and counseling.

## 2023-09-16 NOTE — Patient Instructions (Signed)
VISIT SUMMARY:  During today's visit, we discussed several health concerns you have been experiencing, including weight gain, bloating, a history of blood clots, leg pain, and findings from a recent colonoscopy and CT scan. We reviewed your current medications and made some adjustments to your treatment plan. We also talked about the importance of dietary changes and the potential benefit of psychological support.  YOUR PLAN:  -VENOUS THROMBOEMBOLISM: Venous thromboembolism is a condition where blood clots form in the veins. Given your history of unprovoked pulmonary embolism, you will need lifelong anticoagulation therapy to prevent recurrence. We will reduce your Eliquis dose to half after you finish your current bottle, and a new prescription will be sent to [facility]. Your primary care provider will manage your Eliquis prescription and any future issues related to anticoagulation.  -IRON DEFICIENCY: Iron deficiency occurs when your body doesn't have enough iron, which is essential for making red blood cells. Although your ferritin levels are slightly low, you do not have anemia. We recommend increasing your intake of protein and green vegetables to improve your iron stores.  -ABDOMINAL BLOATING AND WEIGHT GAIN: Abdominal bloating and weight gain can cause significant discomfort and affect your body image. We discussed potential dietary modifications and will place a nutrition consult for further dietary advice and management.   -PSYCHOLOGICAL DISTRESS: Psychological distress can significantly impact your well-being. You appear anxious, particularly about your health and the memory of your mother's death from a pulmonary embolism. We discussed the potential benefit of psychological support, and you may consider a referral to a psychologist or social worker for emotional support and counseling.  INSTRUCTIONS:  Please follow up with your primary care provider, Dr. [name]. Hematology  follow-ups are no longer required unless new issues arise.

## 2023-09-16 NOTE — Assessment & Plan Note (Addendum)
Patient with history of unprovoked pulmonary embolism, currently on Eliquis. Negative thrombophilia testing. Discussed the need for lifelong anticoagulation due to high risk of recurrence. D-dimer: 0.30. Cancer screening uptodate -Reduce Eliquis to half dose after current bottle is finished. New prescription to be sent to [facility]. -Continue Eliquis 2.5mg  twice daily for a life time.   No further hematology needs at this time. Continue to follow with primary care. Recommended patient to reach out to [place] with questions on concerns.

## 2023-09-16 NOTE — Progress Notes (Signed)
[facility] at [facility] HEMATOLOGY FOLLOW-UP VISIT  Sabrina Nevins, MD  REASON FOR FOLLOW-UP: Venous thromboembolism  ASSESSMENT & PLAN:  Patient is a 48 year old female with multiple comorbidities following for recurrent venous thromboembolism   Pulmonary embolism (HCC) Patient with history of unprovoked pulmonary embolism, currently on Eliquis. Negative thrombophilia testing. Discussed the need for lifelong anticoagulation due to high risk of recurrence. D-dimer: 0.30. Cancer screening uptodate -Reduce Eliquis to half dose after current bottle is finished. New prescription to be sent to [facility]. -Continue Eliquis 2.5mg  twice daily for a life time.   No further hematology needs at this time. Continue to follow with primary care. Recommended patient to reach out to [place] with questions on concerns.  Anemia Anemia resolved. Patient has mild iron deficiency with low ferritin. -Increase intake of protein and green vegetables.  Anxiety state Patient appears distressed and anxious, particularly in relation to her health and the memory of her mother's death from a pulmonary embolism. Discussed the potential benefit of psychological support. -Consider referral to a psychologist or social worker for emotional support and counseling.   The total time spent in the appointment was 30 minutes encounter with patients including review of chart and various tests results, discussions about plan of care and coordination of care plan   All questions were answered. The patient knows to call the clinic with any problems, questions or concerns. No barriers to learning was detected.  Cindie Crumbly, MD 2/13/20253:54 PM   SUMMARY OF HEMATOLOGIC HISTORY: 1st episode: 05/11/23: PE Risk factors: Immobility, recent covid infection Current treatment: Eliquis 5mg  twice daily  -Factor V Leiden: Negative -PT gene: Negative -APLS workup: Negative -colonoscopy, mammogram  uptodate  INTERVAL HISTORY: [name] 48 y.o. female following for unprovoked PE. The patient also reports a two-week period of severe leg pain, which she describes as unlike any pain she has previously experienced. The pain was so severe that nothing could alleviate it. The patient also felt a pinching sensation in her back, which she initially thought was causing the leg pain. However, after a few days, the pinching sensation moved, and the leg pain resolved. Patient reports some SOB and fatigue that she associates with fluid in her lungs. Patient was tearful for most of the conversation and seemed to be in emotional distress.    I have reviewed the past medical history, past surgical history, social history and family history with the patient   ALLERGIES:  is allergic to codeine.  MEDICATIONS:  Current Outpatient Medications  Medication Sig Dispense Refill   apixaban (ELIQUIS) 2.5 MG TABS tablet Take 1 tablet (2.5 mg total) by mouth 2 (two) times daily. 180 tablet 2   amitriptyline (ELAVIL) 50 MG tablet Take 50 mg by mouth at bedtime.     amphetamine-dextroamphetamine (ADDERALL) 20 MG tablet Take 20 mg by mouth 2 (two) times daily. ER     apixaban (ELIQUIS) 5 MG TABS tablet Take 2 tablets (10 mg total) by mouth 2 (two) times daily for 7 days, THEN 1 tablet (5 mg total) 2 (two) times daily. 388 tablet 0   butalbital-acetaminophen-caffeine (FIORICET) 50-325-40 MG tablet Take 1 tablet by mouth as needed.     clonazePAM (KLONOPIN) 1 MG tablet Take 1 mg by mouth 3 (three) times daily.     ergocalciferol (VITAMIN D2) 1.25 MG (50000 UT) capsule Take 50,000 Units by mouth once a week.     fluticasone (FLONASE) 50 MCG/ACT nasal spray Place 2 sprays into  both nostrils daily.     gabapentin (NEURONTIN) 100 MG capsule Take 1 capsule (100 mg total) by mouth 4 (four) times daily. 120 capsule 2   HYDROcodone-acetaminophen (NORCO) 7.5-325 MG tablet Take 2 tablets by mouth every 8 (eight) hours as  needed for moderate pain (pain score 4-6). 180 tablet 0   methocarbamol (ROBAXIN) 500 MG tablet Take 500 mg by mouth every 6 (six) hours as needed for muscle spasms.     methylPREDNISolone (MEDROL DOSEPAK) 4 MG TBPK tablet Use as directed 21 tablet 0   polyethylene glycol (MIRALAX / GLYCOLAX) 17 g packet Take 17 g by mouth daily as needed for mild constipation. 30 each 0   traZODone (DESYREL) 50 MG tablet Take 1 tablet (50 mg total) by mouth at bedtime as needed for sleep.     No current facility-administered medications for this visit.     REVIEW OF SYSTEMS:   Constitutional: Denies fevers, chills or night sweats Eyes: Denies blurriness of vision Ears, nose, mouth, throat, and face: Denies mucositis or sore throat Respiratory: Denies cough, dyspnea or wheezes Cardiovascular: Denies palpitation, chest discomfort or lower extremity swelling Gastrointestinal:  Denies nausea, heartburn or change in bowel habits Skin: Denies abnormal skin rashes Lymphatics: Denies new lymphadenopathy or easy bruising Neurological:Denies numbness, tingling or new weaknesses Behavioral/Psych: Mood is stable, no new changes  All other systems were reviewed with the patient and are negative.  PHYSICAL EXAMINATION:   Vitals:   09/16/23 1458  BP: 109/77  Pulse: 95  Resp: 19  Temp: 99.9 F (37.7 C)  SpO2: 96%    GENERAL:alert, no distress and comfortable LUNGS: clear to auscultation and percussion with normal breathing effort HEART: regular rate & rhythm and no murmurs and no lower extremity edema ABDOMEN:abdomen soft, non-tender and normal bowel sounds Musculoskeletal:no cyanosis of digits and no clubbing  NEURO: alert & oriented x 3 with fluent speech  LABORATORY DATA:  I have reviewed the data as listed  Lab Results  Component Value Date   WBC 4.7 09/09/2023   NEUTROABS 2.1 09/09/2023   HGB 12.3 09/09/2023   HCT 38.4 09/09/2023   MCV 98.5 09/09/2023   PLT 302 09/09/2023      Chemistry       Component Value Date/Time   NA 137 06/10/2023 1610   K 3.8 06/10/2023 1610   CL 103 06/10/2023 1610   CO2 23 06/10/2023 1610   BUN 13 06/10/2023 1610   CREATININE 0.88 06/10/2023 1610   CREATININE 0.76 06/20/2013 1455      Component Value Date/Time   CALCIUM 9.6 06/10/2023 1610   ALKPHOS 78 06/10/2023 1610   AST 16 06/10/2023 1610   ALT 12 06/10/2023 1610   BILITOT 0.6 06/10/2023 1610      Latest Reference Range & Units 08/25/08 13:54 09/09/23 14:38  D-Dimer, Quant 0.00 - 0.50 ug/mL-FEU 0.31        AT THE INHOUSE ESTABLISHED CUTOFF VALUE OF 0.48 ug/mL FEU, THIS ASSAY HAS BEEN DOCUMENTED IN THE LITERATURE TO HAVE A SENSITIVITY AND NEGATIVE PREDICTIVE VALUE OF AT LEAST 98 TO 99%.  THE TEST RESULT SHOULD BE CORRELATED WITH AN ASSESSMENT OF THE CLINICAL PROBABILITY OF DVT / VTE. 0.32    Latest Reference Range & Units 06/10/23 16:10  Anticardiolipin Ab,IgA,Qn 0 - 11 APL U/mL <9  Anticardiolipin Ab,IgG,Qn 0 - 14 GPL U/mL <9  Anticardiolipin Ab,IgM,Qn 0 - 12 MPL U/mL 11  PTT Lupus Anticoagulant 0.0 - 43.5 sec 31.1  DRVVT 0.0 - 47.0  sec 52.1 (H)  Lupus Anticoag Interp  Comment: (C)  Beta-2 Glycoprotein I Ab, IgG 0 - 20 GPI IgG units <9  Beta-2-Glycoprotein I IgA 0 - 25 GPI IgA units <9  Beta-2-Glycoprotein I IgM 0 - 32 GPI IgM units <9  (H): Data is abnormally high (C): Corrected   Latest Reference Range & Units 06/10/23 16:10  PTT Lupus Anticoagulant 0.0 - 43.5 sec 31.1  DRVVT 0.0 - 47.0 sec 52.1 (H)  dRVVT Mix 0.0 - 40.4 sec 44.2 (H)  dRVVT Confirm 0.8 - 1.2 ratio 1.1  (H): Data is abnormally high   06/10/23 16:10  Recommendations-F5LEID: Negative  Recommendations-PTGENE: Negative   Radiology:  MM 3D SCREENING MAMMOGRAM BILATERAL BREAST CLINICAL DATA:  Screening.  EXAM: DIGITAL SCREENING BILATERAL MAMMOGRAM WITH TOMOSYNTHESIS AND CAD  TECHNIQUE: Bilateral screening digital craniocaudal and mediolateral oblique mammograms were obtained. Bilateral  screening digital breast tomosynthesis was performed. The images were evaluated with computer-aided detection.  COMPARISON:  Previous exam(s).  ACR Breast Density Category b: There are scattered areas of fibroglandular density.  FINDINGS: There are no findings suspicious for malignancy.  IMPRESSION: No mammographic evidence of malignancy. A result letter of this screening mammogram will be mailed directly to the patient.  RECOMMENDATION: Screening mammogram in one year. (Code:SM-B-01Y)  BI-RADS CATEGORY  1: Negative.  Electronically Signed   By: Baird Lyons M.D.   On: 06/25/2023 14:44

## 2023-09-22 ENCOUNTER — Inpatient Hospital Stay: Payer: Medicare Other | Admitting: Licensed Clinical Social Worker

## 2023-09-22 DIAGNOSIS — I2694 Multiple subsegmental pulmonary emboli without acute cor pulmonale: Secondary | ICD-10-CM

## 2023-09-22 NOTE — Progress Notes (Signed)
 [facility] CSW Progress Note  Visual merchandiser  received a referral to contact pt regarding anxiety.  Pt's anemia has resolved w/ no follow up scheduled with Dr. Anders Simmonds.  CSW attempted to contact pt by phone which went to voicemail.  A detailed message was left for pt encouraging her to reapply for Medicaid if she has not already done so per our conversation in November regarding the same.  CSW suggested pt contact her PCP or pain management doctor for a direct referral for psychiatric services.  CSW to remain available as appropriate.      Rachel Moulds, LCSW Clinical Social Worker [facility]

## 2023-09-23 ENCOUNTER — Emergency Department (HOSPITAL_COMMUNITY): Payer: Medicare Other

## 2023-09-23 ENCOUNTER — Emergency Department (HOSPITAL_COMMUNITY)
Admission: EM | Admit: 2023-09-23 | Discharge: 2023-09-24 | Disposition: A | Discharge status: Home / Self Care | Payer: Medicare Other | Attending: Emergency Medicine | Admitting: Emergency Medicine

## 2023-09-23 ENCOUNTER — Other Ambulatory Visit: Payer: Self-pay

## 2023-09-23 ENCOUNTER — Encounter (HOSPITAL_COMMUNITY): Payer: Self-pay

## 2023-09-23 DIAGNOSIS — R1011 Right upper quadrant pain: Secondary | ICD-10-CM | POA: Insufficient documentation

## 2023-09-23 DIAGNOSIS — R030 Elevated blood-pressure reading, without diagnosis of hypertension: Secondary | ICD-10-CM | POA: Diagnosis not present

## 2023-09-23 DIAGNOSIS — J189 Pneumonia, unspecified organism: Secondary | ICD-10-CM

## 2023-09-23 DIAGNOSIS — Z7901 Long term (current) use of anticoagulants: Secondary | ICD-10-CM | POA: Insufficient documentation

## 2023-09-23 DIAGNOSIS — J181 Lobar pneumonia, unspecified organism: Secondary | ICD-10-CM | POA: Insufficient documentation

## 2023-09-23 DIAGNOSIS — R509 Fever, unspecified: Secondary | ICD-10-CM | POA: Diagnosis not present

## 2023-09-23 DIAGNOSIS — R059 Cough, unspecified: Secondary | ICD-10-CM | POA: Diagnosis not present

## 2023-09-23 DIAGNOSIS — R531 Weakness: Secondary | ICD-10-CM | POA: Diagnosis not present

## 2023-09-23 DIAGNOSIS — Z6822 Body mass index (BMI) 22.0-22.9, adult: Secondary | ICD-10-CM | POA: Diagnosis not present

## 2023-09-23 LAB — COMPREHENSIVE METABOLIC PANEL
ALT: 15 U/L (ref 0–44)
AST: 20 U/L (ref 15–41)
Albumin: 4.6 g/dL (ref 3.5–5.0)
Alkaline Phosphatase: 60 U/L (ref 38–126)
Anion gap: 13 (ref 5–15)
BUN: 18 mg/dL (ref 6–20)
CO2: 20 mmol/L — ABNORMAL LOW (ref 22–32)
Calcium: 9.2 mg/dL (ref 8.9–10.3)
Chloride: 101 mmol/L (ref 98–111)
Creatinine, Ser: 1.14 mg/dL — ABNORMAL HIGH (ref 0.44–1.00)
GFR, Estimated: 59 mL/min — ABNORMAL LOW (ref >60)
Glucose, Bld: 111 mg/dL — ABNORMAL HIGH (ref 70–99)
Potassium: 3.9 mmol/L (ref 3.5–5.1)
Sodium: 134 mmol/L — ABNORMAL LOW (ref 135–145)
Total Bilirubin: 0.7 mg/dL (ref 0.0–1.2)
Total Protein: 7.9 g/dL (ref 6.5–8.1)

## 2023-09-23 LAB — CBC WITH DIFFERENTIAL/PLATELET
Abs Immature Granulocytes: 0.08 10*3/uL — ABNORMAL HIGH (ref 0.00–0.07)
Basophils Absolute: 0.1 10*3/uL (ref 0.0–0.1)
Basophils Relative: 0 %
Eosinophils Absolute: 0 10*3/uL (ref 0.0–0.5)
Eosinophils Relative: 0 %
HCT: 38 % (ref 36.0–46.0)
Hemoglobin: 12.8 g/dL (ref 12.0–15.0)
Immature Granulocytes: 1 %
Lymphocytes Relative: 8 %
Lymphs Abs: 1.3 10*3/uL (ref 0.7–4.0)
MCH: 32.9 pg (ref 26.0–34.0)
MCHC: 33.7 g/dL (ref 30.0–36.0)
MCV: 97.7 fL (ref 80.0–100.0)
Monocytes Absolute: 1.2 10*3/uL — ABNORMAL HIGH (ref 0.1–1.0)
Monocytes Relative: 7 %
Neutro Abs: 13.1 10*3/uL — ABNORMAL HIGH (ref 1.7–7.7)
Neutrophils Relative %: 84 %
Platelets: 274 10*3/uL (ref 150–400)
RBC: 3.89 MIL/uL (ref 3.87–5.11)
RDW: 12.6 % (ref 11.5–15.5)
WBC: 15.7 10*3/uL — ABNORMAL HIGH (ref 4.0–10.5)
nRBC: 0 % (ref 0.0–0.2)

## 2023-09-23 LAB — URINALYSIS, ROUTINE W REFLEX MICROSCOPIC
Bilirubin Urine: NEGATIVE
Glucose, UA: NEGATIVE mg/dL
Ketones, ur: NEGATIVE mg/dL
Leukocytes,Ua: NEGATIVE
Nitrite: NEGATIVE
Protein, ur: NEGATIVE mg/dL
Specific Gravity, Urine: 1.019 (ref 1.005–1.030)
pH: 5 (ref 5.0–8.0)

## 2023-09-23 LAB — RESP PANEL BY RT-PCR (RSV, FLU A&B, COVID)  RVPGX2
Influenza A by PCR: NEGATIVE
Influenza B by PCR: NEGATIVE
Resp Syncytial Virus by PCR: NEGATIVE
SARS Coronavirus 2 by RT PCR: NEGATIVE

## 2023-09-23 LAB — LIPASE, BLOOD: Lipase: 23 U/L (ref 11–51)

## 2023-09-23 MED ORDER — LEVOFLOXACIN IN D5W 750 MG/150ML IV SOLN
750.0000 mg | Freq: Once | INTRAVENOUS | Status: AC
  Administered 2023-09-23: 750 mg via INTRAVENOUS
  Filled 2023-09-23: qty 150

## 2023-09-23 MED ORDER — OXYCODONE-ACETAMINOPHEN 5-325 MG PO TABS: 1.0000 | ORAL_TABLET | Freq: Four times a day (QID) | ORAL | 0 refills | Status: DC | PRN

## 2023-09-23 MED ORDER — SODIUM CHLORIDE 0.9 % IV BOLUS
1000.0000 mL | Freq: Once | INTRAVENOUS | Status: AC
  Administered 2023-09-23: 1000 mL via INTRAVENOUS

## 2023-09-23 MED ORDER — IOHEXOL 300 MG/ML  SOLN
100.0000 mL | Freq: Once | INTRAMUSCULAR | Status: AC | PRN
  Administered 2023-09-23: 100 mL via INTRAVENOUS

## 2023-09-23 MED ORDER — LEVOFLOXACIN 750 MG PO TABS: 750.0000 mg | ORAL_TABLET | Freq: Every day | ORAL | 0 refills | Status: DC

## 2023-09-23 NOTE — ED Provider Notes (Signed)
 [place] EMERGENCY DEPARTMENT AT [facility] Provider Note   CSN: [id number] Arrival date & time: 09/23/23  1836     History {Add pertinent medical, surgical, social history, OB history to HPI:1} Chief Complaint  Patient presents with   Fever    [name] is a 48 y.o. female.  Patient presents with a history of PEs.  She presents with fever and weakness and right upper quadrant abdominal pain.  The history is provided by the patient and medical records. No language interpreter was used.  Fever Temp source:  Oral Severity:  Moderate Onset quality:  Sudden Timing:  Constant Progression:  Worsening Chronicity:  New Relieved by:  Nothing Worsened by:  Nothing Associated symptoms: no chest pain, no congestion, no cough, no diarrhea, no headaches and no rash        Home Medications Prior to Admission medications   Medication Sig Start Date End Date Taking? Authorizing Provider  amitriptyline (ELAVIL) 50 MG tablet Take 50 mg by mouth at bedtime.    [provider]  amphetamine-dextroamphetamine (ADDERALL) 20 MG tablet Take 20 mg by mouth 2 (two) times daily. ER 03/27/22   [provider]  apixaban (ELIQUIS) 2.5 MG TABS tablet Take 1 tablet (2.5 mg total) by mouth 2 (two) times daily. 09/16/23   Cindie Crumbly, MD  apixaban (ELIQUIS) 5 MG TABS tablet Take 2 tablets (10 mg total) by mouth 2 (two) times daily for 7 days, THEN 1 tablet (5 mg total) 2 (two) times daily. 05/12/23 11/15/23  Vassie Loll, MD  butalbital-acetaminophen-caffeine (FIORICET) [phone] MG tablet Take 1 tablet by mouth as needed. 05/20/16   [provider]  clonazePAM (KLONOPIN) 1 MG tablet Take 1 mg by mouth 3 (three) times daily.    [provider]  ergocalciferol (VITAMIN D2) 1.25 MG (50000 UT) capsule Take 50,000 Units by mouth once a week.    [provider]  fluticasone (FLONASE) 50 MCG/ACT nasal spray Place 2 sprays into both nostrils daily.  03/21/21   [provider]  gabapentin (NEURONTIN) 100 MG capsule Take 1 capsule (100 mg total) by mouth 4 (four) times daily. 08/16/23   Jones Bales, NP  HYDROcodone-acetaminophen (NORCO) 7.5-325 MG tablet Take 2 tablets by mouth every 8 (eight) hours as needed for moderate pain (pain score 4-6). 09/03/23   Jones Bales, NP  methocarbamol (ROBAXIN) 500 MG tablet Take 500 mg by mouth every 6 (six) hours as needed for muscle spasms.    [provider]  methylPREDNISolone (MEDROL DOSEPAK) 4 MG TBPK tablet Use as directed 08/02/23   Jones Bales, NP  polyethylene glycol (MIRALAX / GLYCOLAX) 17 g packet Take 17 g by mouth daily as needed for mild constipation. 05/12/23   Vassie Loll, MD  traZODone (DESYREL) 50 MG tablet Take 1 tablet (50 mg total) by mouth at bedtime as needed for sleep. 05/12/23   Vassie Loll, MD      Allergies    Codeine    Review of Systems   Review of Systems  Constitutional:  Positive for fever. Negative for appetite change and fatigue.  HENT:  Negative for congestion, ear discharge and sinus pressure.   Eyes:  Negative for discharge.  Respiratory:  Negative for cough.   Cardiovascular:  Negative for chest pain.  Gastrointestinal:  Positive for abdominal pain. Negative for diarrhea.  Genitourinary:  Negative for frequency and hematuria.  Musculoskeletal:  Negative for back pain.  Skin:  Negative for rash.  Neurological:  Negative for seizures and headaches.  Psychiatric/Behavioral:  Negative for hallucinations.     Physical Exam Updated Vital Signs BP 93/62   Pulse 96   Temp 98.9 F (37.2 C) (Oral)   Resp 18   Ht 5\' 6"  (1.676 m)   Wt 75.5 kg   SpO2 90%   BMI 26.86 kg/m  Physical Exam Vitals and nursing note reviewed.  Constitutional:      Appearance: She is well-developed.  HENT:     Head: Normocephalic.     Nose: Nose normal.  Eyes:     General: No scleral icterus.    Conjunctiva/sclera: Conjunctivae normal.  Neck:      Thyroid: No thyromegaly.  Cardiovascular:     Rate and Rhythm: Normal rate and regular rhythm.     Heart sounds: No murmur heard.    No friction rub. No gallop.  Pulmonary:     Breath sounds: No stridor. No wheezing or rales.  Chest:     Chest wall: No tenderness.  Abdominal:     General: There is no distension.     Tenderness: There is abdominal tenderness. There is no rebound.  Musculoskeletal:        General: Normal range of motion.     Cervical back: Neck supple.  Lymphadenopathy:     Cervical: No cervical adenopathy.  Skin:    Findings: No erythema or rash.  Neurological:     Mental Status: She is alert and oriented to person, place, and time.     Motor: No abnormal muscle tone.     Coordination: Coordination normal.  Psychiatric:        Behavior: Behavior normal.     ED Results / Procedures / Treatments   Labs (all labs ordered are listed, but only abnormal results are displayed) Labs Reviewed  COMPREHENSIVE METABOLIC PANEL - Abnormal; Notable for the following components:      Result Value   Sodium 134 (*)    CO2 20 (*)    Glucose, Bld 111 (*)    Creatinine, Ser 1.14 (*)    GFR, Estimated 59 (*)    All other components within normal limits  CBC WITH DIFFERENTIAL/PLATELET - Abnormal; Notable for the following components:   WBC 15.7 (*)    Neutro Abs 13.1 (*)    Monocytes Absolute 1.2 (*)    Abs Immature Granulocytes 0.08 (*)    All other components within normal limits  URINALYSIS, ROUTINE W REFLEX MICROSCOPIC - Abnormal; Notable for the following components:   Hgb urine dipstick MODERATE (*)    Bacteria, UA RARE (*)    All other components within normal limits  RESP PANEL BY RT-PCR (RSV, FLU A&B, COVID)  RVPGX2  LIPASE, BLOOD    EKG None  Radiology DG Chest 2 View Result Date: 09/23/2023 CLINICAL DATA:  cough EXAM: CHEST - 2 VIEW COMPARISON:  Chest x-ray 05/11/2023 FINDINGS: The heart and mediastinal contours are within normal limits. No focal  consolidation. No pulmonary edema. No pleural effusion. No pneumothorax. No acute osseous abnormality. IMPRESSION: No active cardiopulmonary disease. Electronically Signed   By: Tish Frederickson M.D.   On: 09/23/2023 22:50   CT ABDOMEN PELVIS W CONTRAST Result Date: 09/23/2023 CLINICAL DATA:  Abdominal pain, acute, nonlocalized RUQ Abdominal Pain. Pt endorses a fever of 102.47F EXAM: CT ABDOMEN AND PELVIS WITH CONTRAST TECHNIQUE: Multidetector CT imaging of the abdomen and pelvis was performed using the standard protocol following bolus administration of intravenous contrast. RADIATION DOSE REDUCTION: This exam  was performed according to the departmental dose-optimization program which includes automated exposure control, adjustment of the mA and/or kV according to patient size and/or use of iterative reconstruction technique. CONTRAST:  OMNIPAQUE IOHEXOL 300 MG/ML  SOLN COMPARISON:  CT abdomen pelvis 10/02/2019 FINDINGS: Lower chest: Partially visualized right lower lobe airspace opacity. Hepatobiliary: No focal liver abnormality. No gallstones, gallbladder wall thickening, or pericholecystic fluid. No biliary dilatation. Pancreas: No focal lesion. Normal pancreatic contour. No surrounding inflammatory changes. No main pancreatic ductal dilatation. Spleen: Normal in size without focal abnormality. Adrenals/Urinary Tract: No adrenal nodule bilaterally. Bilateral kidneys enhance symmetrically. No hydronephrosis. No hydroureter. The urinary bladder is unremarkable. Stomach/Bowel: Stomach is within normal limits. No evidence of bowel wall thickening or dilatation. Appendix appears normal. Vascular/Lymphatic: No abdominal aorta or iliac aneurysm. Mild atherosclerotic plaque of the aorta and its branches. No abdominal, pelvic, or inguinal lymphadenopathy. Reproductive: Status post hysterectomy. No adnexal masses. Other: No intraperitoneal free fluid. No intraperitoneal free gas. No organized fluid collection.  Musculoskeletal: No abdominal wall hernia or abnormality. No suspicious lytic or blastic osseous lesions. No acute displaced fracture. Multilevel degenerative changes of the spine. IMPRESSION: 1. Partially visualized right lower lobe airspace opacity. Recommend correlation with PA and lateral chest x-ray. 2.  Aortic Atherosclerosis (ICD10-I70.0). Electronically Signed   By: Tish Frederickson M.D.   On: 09/23/2023 22:00    Procedures Procedures  {Document cardiac monitor, telemetry assessment procedure when appropriate:1}  Medications Ordered in ED Medications  levofloxacin (LEVAQUIN) IVPB 750 mg (has no administration in time range)  sodium chloride 0.9 % bolus 1,000 mL (has no administration in time range)  iohexol (OMNIPAQUE) 300 MG/ML solution 100 mL (100 mLs Intravenous Contrast Given 09/23/23 2150)    ED Course/ Medical Decision Making/ A&P   {   Click here for ABCD2, HEART and other calculatorsREFRESH Note before signing :1}                              Medical Decision Making Amount and/or Complexity of Data Reviewed Labs: ordered. Radiology: ordered.  Risk Prescription drug management.  Patient with right lower lung pneumonia.  She is started on Levaquin and will follow-up with her PCP   {Document critical care time when appropriate:1} {Document review of labs and clinical decision tools ie heart score, Chads2Vasc2 etc:1}  {Document your independent review of radiology images, and any outside records:1} {Document your discussion with family members, caretakers, and with consultants:1} {Document social determinants of health affecting pt's care:1} {Document your decision making why or why not admission, treatments were needed:1} Final Clinical Impression(s) / ED Diagnoses Final diagnoses:  Community acquired pneumonia of right lung, unspecified part of lung    Rx / DC Orders ED Discharge Orders     None

## 2023-09-23 NOTE — Discharge Instructions (Signed)
 Plenty of fluids.  Follow-up with your family doctor next week for recheck.  Return here sooner problems

## 2023-09-23 NOTE — ED Notes (Signed)
 Pt complained of chest pain 6/10 while waiting. York Spaniel it was coming and going in waves.

## 2023-09-23 NOTE — ED Triage Notes (Signed)
 Pt arrived via POV from Urgent Care for further evaluation of RUQ Abdominal Pain. Pt endorses a fever of 102.18F at Urgent Care. Pt received 400mg  Ibuprofen and 1000mg  Tylenol PTA. Pts temp 98.55F in Triage.

## 2023-09-27 ENCOUNTER — Inpatient Hospital Stay: Payer: Medicare Other | Admitting: Dietician

## 2023-10-01 ENCOUNTER — Encounter: Payer: Medicare Other | Admitting: Registered Nurse

## 2023-10-04 ENCOUNTER — Other Ambulatory Visit: Payer: Self-pay | Admitting: Registered Nurse

## 2023-10-05 ENCOUNTER — Telehealth: Payer: Self-pay | Admitting: Registered Nurse

## 2023-10-05 MED ORDER — HYDROCODONE-ACETAMINOPHEN 7.5-325 MG PO TABS: 2.0000 | ORAL_TABLET | Freq: Three times a day (TID) | ORAL | 0 refills | Status: DC | PRN

## 2023-10-05 NOTE — Telephone Encounter (Signed)
 I believe she said [facility] in [place]

## 2023-10-05 NOTE — Telephone Encounter (Signed)
 PMP was Reviewed  Hydrocodone e-scribed to pharmacy.  Call placed to Ms. Perfecto regarding the above, she verbalizes understanding.  She has a scheduled appointment with this provider this month.

## 2023-10-05 NOTE — Telephone Encounter (Signed)
 Pt called and left message on nurse line for a refill of hydrocodone . She called at 12:36 pm today

## 2023-10-14 NOTE — Progress Notes (Signed)
 Subjective:    Patient ID: [name], female    DOB: October 06, 1975, 48 y.o.   MRN: [id number]  HPI: [name] is a 48 y.o. female who returns for follow up appointment for chronic pain and medication refill. She states her pain is located in her  neck radiating into her bilateral shoulders, reports bilateral shoulder pain R>L,mid- lower back. Also reports tingling and burning in her bilateral hands and bilateral feet. Reports increase intensity of right shoulder pain denies falling will order X-ray, she verbalizes understanding.  She rates her pain 6. Her current exercise regime is walking and performing stretching exercises.  [name] Morphine equivalent is 45.00 MME. She  is also prescribed Clonazepam  by Dr. [name] .We have discussed the black box warning of using opioids and benzodiazepines. I highlighted the dangers of using these drugs together and discussed the adverse events including respiratory suppression, overdose, cognitive impairment and importance of compliance with current regimen. We will continue to monitor and adjust as indicated.     UDS ordered today.      Pain Inventory Average Pain 7 Pain Right Now 6 My pain is intermittent, constant, sharp, burning, dull, stabbing, tingling, and aching  In the last 24 hours, has pain interfered with the following? General activity 7 Relation with others 9 Enjoyment of life 9 What TIME of day is your pain at its worst? evening and night Sleep (in general) Poor  Pain is worse with: walking, bending, sitting, inactivity, standing, and some activites Pain improves with: rest, heat/ice, medication, and stretching Relief from Meds: 7  Family History  Problem Relation Age of Onset   Diabetes type II Mother    Colon polyps Mother    Diverticulitis Mother    Diabetes Mother    Heart disease Mother    Osteoporosis Father    Diabetes type II Brother    Uterine cancer Other    Ovarian cancer Other    COPD Paternal  Grandfather    Heart attack Maternal Grandmother    Social History   Socioeconomic History   Marital status: Married    Spouse name: Not on file   Number of children: 2   Years of education: Not on file   Highest education level: Not on file  Occupational History   Occupation: Disability    Employer: UNEMPLOYED  Tobacco Use   Smoking status: Former    Current packs/day: 0.00    Types: Cigarettes    Quit date: 06/30/2011    Years since quitting: 12.2   Smokeless tobacco: Never  Vaping Use   Vaping status: Some Days   Substances: Nicotine, Flavoring  Substance and Sexual Activity   Alcohol use: No    Alcohol/week: 0.0 standard drinks of alcohol   Drug use: No   Sexual activity: Yes  Other Topics Concern   Not on file  Social History Narrative   Not on file   Social Drivers of Health   Financial Resource Strain: High Risk (06/10/2023)   Overall Financial Resource Strain (CARDIA)    Difficulty of Paying Living Expenses: Very hard  Food Insecurity: Food Insecurity Present (06/10/2023)   Hunger Vital Sign    Worried About Running Out of Food in the Last Year: Sometimes true    Ran Out of Food in the Last Year: Never true  Transportation Needs: No Transportation Needs (06/10/2023)   PRAPARE - Administrator, Civil Service (Medical): No    Lack of Transportation (Non-Medical):  No  Physical Activity: Inactive (06/10/2023)   Exercise Vital Sign    Days of Exercise per Week: 7 days    Minutes of Exercise per Session: 0 min  Stress: Stress Concern Present (06/10/2023)   [employer] of Occupational Health - Occupational Stress Questionnaire    Feeling of Stress : Rather much  Social Connections: Moderately Isolated (06/10/2023)   Social Connection and Isolation Panel [NHANES]    Frequency of Communication with Friends and Family: Three times a week    Frequency of Social Gatherings with Friends and Family: Once a week    Attends Religious Services: Never     Database administrator or Organizations: No    Attends Banker Meetings: Never    Marital Status: Married   Past Surgical History:  Procedure Laterality Date   BALLOON DILATION N/A 08/30/2023   Procedure: BALLOON DILATION;  Surgeon: Lanelle Bal, DO;  Location: [facility];  Service: Endoscopy;  Laterality: N/A;   BIOPSY  08/30/2023   Procedure: BIOPSY;  Surgeon: Lanelle Bal, DO;  Location: [facility];  Service: Endoscopy;;   BREAST BIOPSY Right 12/31/2016   FIBROCYSTIC CHANGES WITH CALCIFICATIONS/PSEUDOANGIOMATOUS STROMAL HYPERPLASIA (PASH)   CESAREAN SECTION     COLONOSCOPY WITH PROPOFOL N/A 08/30/2023   Procedure: COLONOSCOPY WITH PROPOFOL;  Surgeon: Lanelle Bal, DO;  Location: [facility];  Service: Endoscopy;  Laterality: N/A;  800am, asa 3   ESOPHAGOGASTRODUODENOSCOPY (EGD) WITH PROPOFOL N/A 08/30/2023   Procedure: ESOPHAGOGASTRODUODENOSCOPY (EGD) WITH PROPOFOL;  Surgeon: Lanelle Bal, DO;  [facility]: [facility];  Service: Endoscopy;  Laterality: N/A;   Left elbow surgery     PARTIAL HYSTERECTOMY     POLYPECTOMY  08/30/2023   Procedure: POLYPECTOMY;  Surgeon: Lanelle Bal, DO;  [facility]: [facility];  Service: Endoscopy;;   TONSILLECTOMY     Past Surgical History:  Procedure Laterality Date   BALLOON DILATION N/A 08/30/2023   Procedure: BALLOON DILATION;  Surgeon: Lanelle Bal, DO;  [facility]: [facility];  Service: Endoscopy;  Laterality: N/A;   BIOPSY  08/30/2023   Procedure: BIOPSY;  Surgeon: Lanelle Bal, DO;  [facility];  Service: Endoscopy;;   BREAST BIOPSY Right 12/31/2016   FIBROCYSTIC CHANGES WITH CALCIFICATIONS/PSEUDOANGIOMATOUS STROMAL HYPERPLASIA (PASH)   CESAREAN SECTION     COLONOSCOPY WITH PROPOFOL N/A 08/30/2023   Procedure: COLONOSCOPY WITH PROPOFOL;  Surgeon: Lanelle Bal, DO;  [facility];  Service: Endoscopy;  Laterality: N/A;  800am, asa 3   ESOPHAGOGASTRODUODENOSCOPY (EGD)  WITH PROPOFOL N/A 08/30/2023   Procedure: ESOPHAGOGASTRODUODENOSCOPY (EGD) WITH PROPOFOL;  Surgeon: Lanelle Bal, DO;  [facility];  Service: Endoscopy;  Laterality: N/A;   Left elbow surgery     PARTIAL HYSTERECTOMY     POLYPECTOMY  08/30/2023   Procedure: POLYPECTOMY;  Surgeon: Lanelle Bal, DO;  [facility];  Service: Endoscopy;;   TONSILLECTOMY     Past Medical History:  Diagnosis Date   Anxiety    Chronic pain    Dr. Wynn Banker   Degenerative disc disease    Spinal, small syrinx   Depression    Esophageal stricture    Status post dilatation   Fibromyalgia    GERD (gastroesophageal reflux disease)    Hiatal hernia    Internal hemorrhoids without mention of complication    Irritable bowel syndrome    Palpitations    No documented arrhythmias   Personal history of  colonic polyps 10/04/2000   HYPERPLASTIC POLYP   POTS (postural orthostatic tachycardia syndrome)    Pulmonary embolus (HCC) 05/04/2023   Syncope    Negative tilt table test   Syringomyelia and syringobulbia (HCC)    There were no vitals taken for this visit.  Opioid Risk Score:   Fall Risk Score:  `1  Depression screen [facility] 2/9     09/03/2023    2:47 PM 06/10/2023    3:30 PM 06/01/2023    2:51 PM 04/22/2023    1:56 PM 01/18/2023    3:48 PM 12/18/2022    2:53 PM 11/18/2022    3:03 PM  Depression screen PHQ 2/9  Decreased Interest 0 3 0 1 1 1 1   Down, Depressed, Hopeless 0 3 0 1 1 1 1   PHQ - 2 Score 0 6 0 2 2 2 2   Altered sleeping  1       Tired, decreased energy  3       Change in appetite  0       Feeling bad or failure about yourself   3       Trouble concentrating  1       Moving slowly or fidgety/restless  1       Suicidal thoughts  0       PHQ-9 Score  15       Difficult doing work/chores  Very difficult         Review of Systems  Musculoskeletal:  Positive for back pain and neck pain.       Pain in left rib area  All other systems reviewed and are  negative.      Objective:   Physical Exam        Assessment & Plan:  1. Lumbar degenerative disc disease: 10/16/2023. Continue with current treatment regimen: Refilled : HYDROcodone 7.5/325 mg two tablets every 8 hours as needed #180. Second script sent for the following month to accommodate scheduled appointment. We will continue the opioid monitoring program, this consists of regular clinic visits, examinations, urine drug screen, pill counts as well as use of [place] Controlled Substance Reporting system. A 12 month History has been reviewed on the [place] Controlled Substance Reporting System on 10/16/2023 2. Thoracic Spondylosis: Continue current treatment with HEP and  current medication regime. 10/16/2023. 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 Continue to Monitor. 10/16/2023.  Lyrica discontinued due to adverse effects. Continue with exercise and heat therapy. 10/16/2023 4. Fibromyalgia:Continue with Heat and  exercise Regimen. Continue Gabapentin. 10/16/2023  5.Depression and anxiety: PCP Following. 10/16/2023 6. Orthostatic hypotension: Cardiology Following. 10/16/2023 7. Polyarthralgia: Continue HEP as tolerated . Continue to monitor. 10/16/2023 8. Chronic Bilateral  Shoulder Pain:  Continue current medication regime. Continue HEP as Tolerated. 03/15//2025. 9. Muscle Spasm:No complaints today.  Continue to monitor.  10/16/2023. 10. Neuropathic Pain: Continue Gabapentin. Continue to Monitor.  10/16/2023  11. Right elbow Pain: No complaints today. Continue current medication regimen. Continue to monitor. 10/16/2023 12. Right  Knee with OA: No complaints today.Continue HEP as Tolerated. Continue current medication regimen. Continue to monitor. 31/15/2025  13. Right Hip Pain: No complaints today. Continue with HEP as Tolerated. Continue to Monitor. 10/16/2023   F/U in 1 Month

## 2023-10-15 ENCOUNTER — Encounter: Payer: Medicare Other | Attending: Physical Medicine & Rehabilitation | Admitting: Registered Nurse

## 2023-10-15 ENCOUNTER — Encounter: Payer: Self-pay | Admitting: Registered Nurse

## 2023-10-15 VITALS — BP 120/84 | HR 84 | Ht 66.0 in | Wt 166.0 lb

## 2023-10-15 DIAGNOSIS — G894 Chronic pain syndrome: Secondary | ICD-10-CM | POA: Insufficient documentation

## 2023-10-15 DIAGNOSIS — M545 Low back pain, unspecified: Secondary | ICD-10-CM | POA: Insufficient documentation

## 2023-10-15 DIAGNOSIS — M792 Neuralgia and neuritis, unspecified: Secondary | ICD-10-CM | POA: Diagnosis not present

## 2023-10-15 DIAGNOSIS — M797 Fibromyalgia: Secondary | ICD-10-CM | POA: Diagnosis not present

## 2023-10-15 DIAGNOSIS — G5793 Unspecified mononeuropathy of bilateral lower limbs: Secondary | ICD-10-CM | POA: Insufficient documentation

## 2023-10-15 DIAGNOSIS — G8929 Other chronic pain: Secondary | ICD-10-CM | POA: Diagnosis not present

## 2023-10-15 DIAGNOSIS — Z79891 Long term (current) use of opiate analgesic: Secondary | ICD-10-CM | POA: Diagnosis not present

## 2023-10-15 DIAGNOSIS — M542 Cervicalgia: Secondary | ICD-10-CM | POA: Diagnosis not present

## 2023-10-15 DIAGNOSIS — Z5181 Encounter for therapeutic drug level monitoring: Secondary | ICD-10-CM | POA: Diagnosis not present

## 2023-10-15 DIAGNOSIS — M5412 Radiculopathy, cervical region: Secondary | ICD-10-CM | POA: Diagnosis not present

## 2023-10-15 DIAGNOSIS — M2550 Pain in unspecified joint: Secondary | ICD-10-CM | POA: Insufficient documentation

## 2023-10-15 DIAGNOSIS — M546 Pain in thoracic spine: Secondary | ICD-10-CM | POA: Insufficient documentation

## 2023-10-15 DIAGNOSIS — M25511 Pain in right shoulder: Secondary | ICD-10-CM | POA: Diagnosis not present

## 2023-10-15 DIAGNOSIS — M25512 Pain in left shoulder: Secondary | ICD-10-CM | POA: Diagnosis not present

## 2023-10-15 MED ORDER — HYDROCODONE-ACETAMINOPHEN 7.5-325 MG PO TABS: 2.0000 | ORAL_TABLET | Freq: Three times a day (TID) | ORAL | 0 refills | Status: DC | PRN

## 2023-10-15 MED ORDER — GABAPENTIN 100 MG PO CAPS: 100.0000 mg | ORAL_CAPSULE | Freq: Four times a day (QID) | ORAL | 2 refills | Status: DC

## 2023-10-17 NOTE — Progress Notes (Deleted)
 GI Office Note    Referring Provider: Elfredia Nevins, MD Primary Care Physician:  Sabrina Nevins, MD Primary Gastroenterologist: [name]. Sabrina Lor, DO  Date:  10/17/2023  ID:  [name], DOB 09/09/1975, MRN [id number]   Chief Complaint   No chief complaint on file.   History of Present Illness  [name] is a 48 y.o. female with a history of *** presenting today for follow up post procedures.  Admission to [facility] October 2024 for unprovoked PPE. Echo normal. No DVT on dopplers. Found to have anemia with hematology in October (Hgb 9.8). Improved to 12.8 in November.  EGD October 2009: empiric dilation performed. Normal otherwise.   Last office visit 06/23/23. Noted abdominal bloating and distention, esophageal dysphagia (pills and solids). Advised to conitnue miralax. Scheduled for EGD and Colonoscopy.   EGD 08/30/23: - Normal esophagus s/p dilation - Z- line regular.  - Gastritis. Biopsied.  - Normal duodenum - Path: chronic gastritis, negative H. Pylori  Colonoscopy 08/30/23: - Non- bleeding internal hemorrhoids.  - One 6 mm polyp in the ascending colon - Two 4 to 6 mm polyps in the sigmoid colon and in the transverse colon - The examination was otherwise normal. - Path: tubular adenoma and hyperplastic polyp with mucosal prolapse - Repeat in 7 years  Today:    Wt Readings from Last 3 Encounters:  10/15/23 166 lb (75.3 kg)  09/23/23 166 lb 6.4 oz (75.5 kg)  09/16/23 166 lb 6.4 oz (75.5 kg)    Current Outpatient Medications  Medication Sig Dispense Refill   amitriptyline (ELAVIL) 50 MG tablet Take 50 mg by mouth at bedtime.     amphetamine-dextroamphetamine (ADDERALL XR) 25 MG 24 hr capsule Take by mouth daily. (Patient not taking: Reported on 10/15/2023)     amphetamine-dextroamphetamine (ADDERALL) 20 MG tablet Take 20 mg by mouth 2 (two) times daily. ER     apixaban (ELIQUIS) 2.5 MG TABS tablet Take 1 tablet (2.5 mg total) by mouth 2 (two) times daily.  180 tablet 2   apixaban (ELIQUIS) 5 MG TABS tablet Take 2 tablets (10 mg total) by mouth 2 (two) times daily for 7 days, THEN 1 tablet (5 mg total) 2 (two) times daily. 388 tablet 0   butalbital-acetaminophen-caffeine (FIORICET) 50-325-40 MG tablet Take 1 tablet by mouth as needed.     clonazePAM (KLONOPIN) 1 MG tablet Take 1 mg by mouth 3 (three) times daily.     ergocalciferol (VITAMIN D2) 1.25 MG (50000 UT) capsule Take 50,000 Units by mouth once a week.     fluticasone (FLONASE) 50 MCG/ACT nasal spray Place 2 sprays into both nostrils daily.     gabapentin (NEURONTIN) 100 MG capsule Take 1 capsule (100 mg total) by mouth 4 (four) times daily. 120 capsule 2   HYDROcodone-acetaminophen (NORCO) 7.5-325 MG tablet Take 2 tablets by mouth every 8 (eight) hours as needed for moderate pain (pain score 4-6). Do Not Fill Before 11/02/2023 180 tablet 0   meloxicam (MOBIC) 7.5 MG tablet Take 7.5 mg by mouth 2 (two) times daily as needed. (Patient not taking: Reported on 10/15/2023)     methocarbamol (ROBAXIN) 500 MG tablet Take 500 mg by mouth every 6 (six) hours as needed for muscle spasms.     methylPREDNISolone (MEDROL DOSEPAK) 4 MG TBPK tablet Use as directed 21 tablet 0   polyethylene glycol (MIRALAX / GLYCOLAX) 17 g packet Take 17 g by mouth daily as needed for mild constipation. 30 each 0  traZODone (DESYREL) 50 MG tablet Take 1 tablet (50 mg total) by mouth at bedtime as needed for sleep.     No current facility-administered medications for this visit.    Past Medical History:  Diagnosis Date   Anxiety    Chronic pain    Dr. Wynn Shea   Degenerative disc disease    Spinal, small syrinx   Depression    Esophageal stricture    Status post dilatation   Fibromyalgia    GERD (gastroesophageal reflux disease)    Hiatal hernia    Internal hemorrhoids without mention of complication    Irritable bowel syndrome    Palpitations    No documented arrhythmias   Personal history of colonic polyps  10/04/2000   HYPERPLASTIC POLYP   POTS (postural orthostatic tachycardia syndrome)    Pulmonary embolus (HCC) 05/04/2023   Syncope    Negative tilt table test   Syringomyelia and syringobulbia [facility])     Past Surgical History:  Procedure Laterality Date   BALLOON DILATION N/A 08/30/2023   Procedure: BALLOON DILATION;  Surgeon: Lanelle Bal, DO;  [facility];  Service: Endoscopy;  Laterality: N/A;   BIOPSY  08/30/2023   Procedure: BIOPSY;  Surgeon: Lanelle Bal, DO;  [facility];  Service: Endoscopy;;   BREAST BIOPSY Right 12/31/2016   FIBROCYSTIC CHANGES WITH CALCIFICATIONS/PSEUDOANGIOMATOUS STROMAL HYPERPLASIA (PASH)   CESAREAN SECTION     COLONOSCOPY WITH PROPOFOL N/A 08/30/2023   Procedure: COLONOSCOPY WITH PROPOFOL;  Surgeon: Lanelle Bal, DO;  [facility];  Service: Endoscopy;  Laterality: N/A;  800am, asa 3   ESOPHAGOGASTRODUODENOSCOPY (EGD) WITH PROPOFOL N/A 08/30/2023   Procedure: ESOPHAGOGASTRODUODENOSCOPY (EGD) WITH PROPOFOL;  Surgeon: Lanelle Bal, DO;  [facility];  Service: Endoscopy;  Laterality: N/A;   Left elbow surgery     PARTIAL HYSTERECTOMY     POLYPECTOMY  08/30/2023   Procedure: POLYPECTOMY;  Surgeon: Lanelle Bal, DO;  [facility];  Service: Endoscopy;;   TONSILLECTOMY      Family History  Problem Relation Age of Onset   Diabetes type II Mother    Colon polyps Mother    Diverticulitis Mother    Diabetes Mother    Heart disease Mother    Osteoporosis Father    Diabetes type II Brother    Uterine cancer Other    Ovarian cancer Other    COPD Paternal Grandfather    Heart attack Maternal Grandmother     Allergies as of 10/18/2023 - Review Complete 10/15/2023  Allergen Reaction Noted   Codeine Other (See Comments) 12/31/2015    Social History   Socioeconomic History   Marital status: Married    Spouse name: Not on file   Number of children: 2   Years of education:  Not on file   Highest education level: Not on file  Occupational History   Occupation: Disability    Employer: UNEMPLOYED  Tobacco Use   Smoking status: Former    Current packs/day: 0.00    Types: Cigarettes    Quit date: 06/30/2011    Years since quitting: 12.3   Smokeless tobacco: Never  Vaping Use   Vaping status: Some Days   Substances: Nicotine, Flavoring  Substance and Sexual Activity   Alcohol use: No    Alcohol/week: 0.0 standard drinks of alcohol   Drug use: No   Sexual activity: Yes  Other Topics Concern   Not on file  Social History Narrative  Not on file   Social Drivers of Health   Financial Resource Strain: High Risk (06/10/2023)   Overall Financial Resource Strain (CARDIA)    Difficulty of Paying Living Expenses: Very hard  Food Insecurity: Food Insecurity Present (06/10/2023)   Hunger Vital Sign    Worried About Running Out of Food in the Last Year: Sometimes true    Ran Out of Food in the Last Year: Never true  Transportation Needs: No Transportation Needs (06/10/2023)   PRAPARE - Administrator, Civil Service (Medical): No    Lack of Transportation (Non-Medical): No  Physical Activity: Inactive (06/10/2023)   Exercise Vital Sign    Days of Exercise per Week: 7 days    Minutes of Exercise per Session: 0 min  Stress: Stress Concern Present (06/10/2023)   [employer] of Occupational Health - Occupational Stress Questionnaire    Feeling of Stress : Rather much  Social Connections: Moderately Isolated (06/10/2023)   Social Connection and Isolation Panel [NHANES]    Frequency of Communication with Friends and Family: Three times a week    Frequency of Social Gatherings with Friends and Family: Once a week    Attends Religious Services: Never    Database administrator or Organizations: No    Attends Engineer, structural: Never    Marital Status: Married     Review of Systems   Gen: Denies fever, chills, anorexia. Denies  fatigue, weakness, weight loss.  CV: Denies chest pain, palpitations, syncope, peripheral edema, and claudication. Resp: Denies dyspnea at rest, cough, wheezing, coughing up blood, and pleurisy. GI: See HPI Derm: Denies rash, itching, dry skin Psych: Denies depression, anxiety, memory loss, confusion. No homicidal or suicidal ideation.  Heme: Denies bruising, bleeding, and enlarged lymph nodes.  Physical Exam   There were no vitals taken for this visit.  General:   Alert and oriented. No distress noted. Pleasant and cooperative.  Head:  Normocephalic and atraumatic. Eyes:  Conjuctiva clear without scleral icterus. Mouth:  Oral mucosa pink and moist. Good dentition. No lesions. Lungs:  Clear to auscultation bilaterally. No wheezes, rales, or rhonchi. No distress.  Heart:  S1, S2 present without murmurs appreciated.  Abdomen:  +BS, soft, non-tender and non-distended. No rebound or guarding. No HSM or masses noted. Rectal: *** Msk:  Symmetrical without gross deformities. Normal posture. Extremities:  Without edema. Neurologic:  Alert and  oriented x4 Psych:  Alert and cooperative. Normal mood and affect.  Assessment  [name] is a 48 y.o. female with a history of *** presenting today with   Chronic Constipation:   Dysphagia:   History of colon polyps:    PLAN   ***     Brooke Bonito, MSN, FNP-BC, AGACNP-BC [facility]

## 2023-10-18 ENCOUNTER — Ambulatory Visit: Payer: Medicare Other | Admitting: Gastroenterology

## 2023-10-20 LAB — TOXASSURE SELECT,+ANTIDEPR,UR

## 2023-10-21 ENCOUNTER — Other Ambulatory Visit (HOSPITAL_COMMUNITY): Payer: Self-pay | Admitting: Internal Medicine

## 2023-10-21 ENCOUNTER — Ambulatory Visit (HOSPITAL_COMMUNITY)
Admission: RE | Admit: 2023-10-21 | Discharge: 2023-10-21 | Disposition: A | Discharge status: Home / Self Care | Source: Ambulatory Visit | Attending: Internal Medicine | Admitting: Internal Medicine

## 2023-10-21 DIAGNOSIS — R053 Chronic cough: Secondary | ICD-10-CM | POA: Insufficient documentation

## 2023-10-21 DIAGNOSIS — Z79899 Other long term (current) drug therapy: Secondary | ICD-10-CM | POA: Diagnosis not present

## 2023-10-21 DIAGNOSIS — M8580 Other specified disorders of bone density and structure, unspecified site: Secondary | ICD-10-CM | POA: Diagnosis not present

## 2023-10-21 DIAGNOSIS — Z86711 Personal history of pulmonary embolism: Secondary | ICD-10-CM | POA: Diagnosis not present

## 2023-10-21 DIAGNOSIS — I10 Essential (primary) hypertension: Secondary | ICD-10-CM | POA: Diagnosis not present

## 2023-10-21 DIAGNOSIS — E2749 Other adrenocortical insufficiency: Secondary | ICD-10-CM | POA: Diagnosis not present

## 2023-10-21 DIAGNOSIS — G894 Chronic pain syndrome: Secondary | ICD-10-CM | POA: Diagnosis not present

## 2023-10-21 DIAGNOSIS — R0602 Shortness of breath: Secondary | ICD-10-CM | POA: Diagnosis not present

## 2023-11-26 ENCOUNTER — Encounter: Admitting: Registered Nurse

## 2023-11-30 ENCOUNTER — Encounter: Attending: Physical Medicine & Rehabilitation | Admitting: Registered Nurse

## 2023-11-30 ENCOUNTER — Encounter: Payer: Self-pay | Admitting: Registered Nurse

## 2023-11-30 VITALS — BP 119/87 | HR 85 | Ht 66.0 in | Wt 170.2 lb

## 2023-11-30 DIAGNOSIS — M25511 Pain in right shoulder: Secondary | ICD-10-CM | POA: Diagnosis not present

## 2023-11-30 DIAGNOSIS — M25561 Pain in right knee: Secondary | ICD-10-CM

## 2023-11-30 DIAGNOSIS — M542 Cervicalgia: Secondary | ICD-10-CM | POA: Insufficient documentation

## 2023-11-30 DIAGNOSIS — M25562 Pain in left knee: Secondary | ICD-10-CM | POA: Diagnosis not present

## 2023-11-30 DIAGNOSIS — M5412 Radiculopathy, cervical region: Secondary | ICD-10-CM | POA: Insufficient documentation

## 2023-11-30 DIAGNOSIS — M792 Neuralgia and neuritis, unspecified: Secondary | ICD-10-CM | POA: Insufficient documentation

## 2023-11-30 DIAGNOSIS — M797 Fibromyalgia: Secondary | ICD-10-CM | POA: Insufficient documentation

## 2023-11-30 DIAGNOSIS — M2550 Pain in unspecified joint: Secondary | ICD-10-CM | POA: Insufficient documentation

## 2023-11-30 DIAGNOSIS — M25512 Pain in left shoulder: Secondary | ICD-10-CM | POA: Diagnosis not present

## 2023-11-30 DIAGNOSIS — M545 Low back pain, unspecified: Secondary | ICD-10-CM | POA: Diagnosis not present

## 2023-11-30 DIAGNOSIS — M546 Pain in thoracic spine: Secondary | ICD-10-CM | POA: Insufficient documentation

## 2023-11-30 DIAGNOSIS — G8929 Other chronic pain: Secondary | ICD-10-CM | POA: Diagnosis not present

## 2023-11-30 DIAGNOSIS — G5793 Unspecified mononeuropathy of bilateral lower limbs: Secondary | ICD-10-CM | POA: Diagnosis not present

## 2023-11-30 MED ORDER — GABAPENTIN 100 MG PO CAPS: 100.0000 mg | ORAL_CAPSULE | Freq: Four times a day (QID) | ORAL | 2 refills | Status: DC

## 2023-11-30 MED ORDER — HYDROCODONE-ACETAMINOPHEN 7.5-325 MG PO TABS: 2.0000 | ORAL_TABLET | Freq: Three times a day (TID) | ORAL | 0 refills | Status: DC | PRN

## 2023-11-30 NOTE — Progress Notes (Signed)
 Subjective:    Patient ID: [name], female    DOB: 1976/01/11, 48 y.o.   MRN: [id number]  HPI: [name] is a 48 y.o. female who returns for follow up appointment for chronic pain and medication refill. She states her pain is located in her neck radiating into her bilateral shoulder, mid- lower back pain radiating into her bilateral lower extremities and reports generalized joint and Fibro pain. Also reports bilateral hands and bilateral feet with tingling and burning. She rates her pain 7. Her current exercise regime is walking and performing stretching exercises.  [name] Morphine equivalent is 45.00 MME.   Last UDS was Performed on 10/15/2023, it was consistent.      Pain Inventory Average Pain 7 Pain Right Now 7 My pain is intermittent, sharp, burning, dull, stabbing, tingling, and aching  In the last 24 hours, has pain interfered with the following? General activity 7 Relation with others 7 Enjoyment of life 7 What TIME of day is your pain at its worst? varies Sleep (in general) NA  Pain is worse with: walking, bending, sitting, inactivity, standing, and some activites Pain improves with: rest, heat/ice, and medication Relief from Meds: 7  Family History  Problem Relation Age of Onset   Diabetes type II Mother    Colon polyps Mother    Diverticulitis Mother    Diabetes Mother    Heart disease Mother    Osteoporosis Father    Diabetes type II Brother    Uterine cancer Other    Ovarian cancer Other    COPD Paternal Grandfather    Heart attack Maternal Grandmother    Social History   Socioeconomic History   Marital status: Married    Spouse name: Not on file   Number of children: 2   Years of education: Not on file   Highest education level: Not on file  Occupational History   Occupation: Disability    Employer: UNEMPLOYED  Tobacco Use   Smoking status: Former    Current packs/day: 0.00    Types: Cigarettes    Quit date: 06/30/2011    Years  since quitting: 12.4   Smokeless tobacco: Never  Vaping Use   Vaping status: Some Days   Substances: Nicotine, Flavoring  Substance and Sexual Activity   Alcohol  use: No    Alcohol /week: 0.0 standard drinks of alcohol    Drug use: No   Sexual activity: Yes  Other Topics Concern   Not on file  Social History Narrative   Not on file   Social Drivers of Health   Financial Resource Strain: High Risk (06/10/2023)   Overall Financial Resource Strain (CARDIA)    Difficulty of Paying Living Expenses: Very hard  Food Insecurity: Food Insecurity Present (06/10/2023)   Hunger Vital Sign    Worried About Running Out of Food in the Last Year: Sometimes true    Ran Out of Food in the Last Year: Never true  Transportation Needs: No Transportation Needs (06/10/2023)   PRAPARE - Administrator, Civil Service (Medical): No    Lack of Transportation (Non-Medical): No  Physical Activity: Inactive (06/10/2023)   Exercise Vital Sign    Days of Exercise per Week: 7 days    Minutes of Exercise per Session: 0 min  Stress: Stress Concern Present (06/10/2023)   [employer] of Occupational Health - Occupational Stress Questionnaire    Feeling of Stress : Rather much  Social Connections: Moderately Isolated (06/10/2023)   Social  Connection and Isolation Panel [NHANES]    Frequency of Communication with Friends and Family: Three times a week    Frequency of Social Gatherings with Friends and Family: Once a week    Attends Religious Services: Never    Database administrator or Organizations: No    Attends Banker Meetings: Never    Marital Status: Married   Past Surgical History:  Procedure Laterality Date   BALLOON DILATION N/A 08/30/2023   Procedure: BALLOON DILATION;  Surgeon: Vinetta Greening, DO;  [facility];  Service: Endoscopy;  Laterality: N/A;   BIOPSY  08/30/2023   Procedure: BIOPSY;  Surgeon: Vinetta Greening, DO;  [facility];  Service:  Endoscopy;;   BREAST BIOPSY Right 12/31/2016   FIBROCYSTIC CHANGES WITH CALCIFICATIONS/PSEUDOANGIOMATOUS STROMAL HYPERPLASIA (PASH)   CESAREAN SECTION     COLONOSCOPY WITH PROPOFOL  N/A 08/30/2023   Procedure: COLONOSCOPY WITH PROPOFOL ;  Surgeon: Vinetta Greening, DO;  [facility];  Service: Endoscopy;  Laterality: N/A;  800am, asa 3   ESOPHAGOGASTRODUODENOSCOPY (EGD) WITH PROPOFOL  N/A 08/30/2023   Procedure: ESOPHAGOGASTRODUODENOSCOPY (EGD) WITH PROPOFOL ;  Surgeon: Vinetta Greening, DO;  [facility];  Service: Endoscopy;  Laterality: N/A;   Left elbow surgery     PARTIAL HYSTERECTOMY     POLYPECTOMY  08/30/2023   Procedure: POLYPECTOMY;  Surgeon: Vinetta Greening, DO;  [facility];  Service: Endoscopy;;   TONSILLECTOMY     Past Surgical History:  Procedure Laterality Date   BALLOON DILATION N/A 08/30/2023   Procedure: BALLOON DILATION;  Surgeon: Vinetta Greening, DO;  [facility];  Service: Endoscopy;  Laterality: N/A;   BIOPSY  08/30/2023   Procedure: BIOPSY;  Surgeon: Vinetta Greening, DO;  [facility];  Service: Endoscopy;;   BREAST BIOPSY Right 12/31/2016   FIBROCYSTIC CHANGES WITH CALCIFICATIONS/PSEUDOANGIOMATOUS STROMAL HYPERPLASIA (PASH)   CESAREAN SECTION     COLONOSCOPY WITH PROPOFOL  N/A 08/30/2023   Procedure: COLONOSCOPY WITH PROPOFOL ;  Surgeon: Vinetta Greening, DO;  [facility];  Service: Endoscopy;  Laterality: N/A;  800am, asa 3   ESOPHAGOGASTRODUODENOSCOPY (EGD) WITH PROPOFOL  N/A 08/30/2023   Procedure: ESOPHAGOGASTRODUODENOSCOPY (EGD) WITH PROPOFOL ;  Surgeon: Vinetta Greening, DO;  [facility];  Service: Endoscopy;  Laterality: N/A;   Left elbow surgery     PARTIAL HYSTERECTOMY     POLYPECTOMY  08/30/2023   Procedure: POLYPECTOMY;  Surgeon: Vinetta Greening, DO;  [facility];  Service: Endoscopy;;   TONSILLECTOMY     Past Medical History:  Diagnosis Date   Anxiety     Chronic pain    Dr. Sharl Davies   Degenerative disc disease    Spinal, small syrinx   Depression    Esophageal stricture    Status post dilatation   Fibromyalgia    GERD (gastroesophageal reflux disease)    Hiatal hernia    Internal hemorrhoids without mention of complication    Irritable bowel syndrome    Palpitations    No documented arrhythmias   Personal history of colonic polyps 10/04/2000   HYPERPLASTIC POLYP   POTS (postural orthostatic tachycardia syndrome)    Pulmonary embolus (HCC) 05/04/2023   Syncope    Negative tilt table test   Syringomyelia and syringobulbia (HCC)    BP 119/87   Pulse 85   Ht 5\' 6"  (1.676 m)   Wt 170 lb 3.2 oz (77.2 kg)   SpO2 98%  BMI 27.47 kg/m   Opioid Risk Score:   Fall Risk Score:  `1  Depression screen PHQ 2/9     11/30/2023    2:11 PM 10/15/2023    2:18 PM 09/03/2023    2:47 PM 06/10/2023    3:30 PM 06/01/2023    2:51 PM 04/22/2023    1:56 PM 01/18/2023    3:48 PM  Depression screen PHQ 2/9  Decreased Interest 1 1 0 3 0 1 1  Down, Depressed, Hopeless 1 1 0 3 0 1 1  PHQ - 2 Score 2 2 0 6 0 2 2  Altered sleeping    1     Tired, decreased energy    3     Change in appetite    0     Feeling bad or failure about yourself     3     Trouble concentrating    1     Moving slowly or fidgety/restless    1     Suicidal thoughts    0     PHQ-9 Score    15     Difficult doing work/chores    Very difficult       Review of Systems  Musculoskeletal:  Positive for arthralgias, back pain, myalgias and neck pain.  All other systems reviewed and are negative.      Objective:   Physical Exam Vitals and nursing note reviewed.  Constitutional:      Appearance: Normal appearance.  Neck:     Comments: Cervical Paraspinal Tenderness: C-5- C-6 Cardiovascular:     Rate and Rhythm: Normal rate and regular rhythm.     Pulses: Normal pulses.     Heart sounds: Normal heart sounds.  Pulmonary:     Effort: Pulmonary effort is normal.      Breath sounds: Normal breath sounds.  Musculoskeletal:     Comments: Normal Muscle Bulk and Muscle Testing Reveals:  Upper Extremities: Full ROM and Muscle Strength 5/5 Bilateral AC Joint Tenderness  Lumbar Paraspinal Tenderness: L-4-5 Lower Extremities: Full ROM and Muscle Strength 5/5 Arises from Table with Ease  Narrow Based  Gait     Skin:    General: Skin is warm and dry.  Neurological:     Mental Status: She is alert and oriented to person, place, and time.  Psychiatric:        Mood and Affect: Mood normal.        Behavior: Behavior normal.         Assessment & Plan:  1. Lumbar degenerative disc disease: 11/30/2023. Continue with current treatment regimen: Refilled : HYDROcodone  7.5/325 mg two tablets every 8 hours as needed #180. Second script sent for the following month to accommodate scheduled appointment. We will continue the opioid monitoring program, this consists of regular clinic visits, examinations, urine drug screen, pill counts as well as use of [place]  Controlled Substance Reporting system. A 12 month History has been reviewed on the   Controlled Substance Reporting System on 11/30/2023 2. Thoracic Spondylosis: Continue current treatment with HEP and  current medication regime. 11/30/2023. 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 Continue to Monitor. 11/30/2023.  Lyrica  discontinued due to adverse effects. Continue with exercise and heat therapy. 11/30/2023 4. Fibromyalgia:Continue with Heat and  exercise Regimen. Continue Gabapentin . 11/30/2023  5.Depression and anxiety: PCP Following. 11/30/2023 6. Orthostatic hypotension: Cardiology Following. 11/30/2023 7. Polyarthralgia: Continue HEP as tolerated . Continue to monitor. 11/30/2023 8. Chronic Bilateral  Shoulder Pain:  Continue current medication regime. Continue HEP as Tolerated. 04/29//2025. 9. Muscle Spasm:No complaints  today.  Continue to monitor.  11/30/2023. 10. Neuropathic Pain: Continue Gabapentin . Continue to Monitor.  11/30/2023  11. Right elbow Pain: No complaints today. Continue current medication regimen. Continue to monitor. 11/30/2023 12. Bilateral  Knee with OA:.Continue HEP as Tolerated. Continue current medication regimen. Continue to monitor. 11/30/2023  13. Right Hip Pain: No complaints today. Continue with HEP as Tolerated. Continue to Monitor. 11/30/2023   F/U in 1 Month

## 2023-12-30 ENCOUNTER — Encounter: Admitting: Registered Nurse

## 2023-12-30 ENCOUNTER — Encounter: Attending: Physical Medicine & Rehabilitation | Admitting: Registered Nurse

## 2023-12-30 ENCOUNTER — Encounter: Payer: Self-pay | Admitting: Registered Nurse

## 2023-12-30 VITALS — BP 108/68 | HR 96 | Ht 66.0 in

## 2023-12-30 DIAGNOSIS — M5412 Radiculopathy, cervical region: Secondary | ICD-10-CM | POA: Diagnosis not present

## 2023-12-30 DIAGNOSIS — M25551 Pain in right hip: Secondary | ICD-10-CM

## 2023-12-30 DIAGNOSIS — M542 Cervicalgia: Secondary | ICD-10-CM

## 2023-12-30 DIAGNOSIS — M25561 Pain in right knee: Secondary | ICD-10-CM

## 2023-12-30 DIAGNOSIS — M2550 Pain in unspecified joint: Secondary | ICD-10-CM | POA: Diagnosis not present

## 2023-12-30 DIAGNOSIS — M545 Low back pain, unspecified: Secondary | ICD-10-CM

## 2023-12-30 DIAGNOSIS — M797 Fibromyalgia: Secondary | ICD-10-CM | POA: Diagnosis not present

## 2023-12-30 DIAGNOSIS — M792 Neuralgia and neuritis, unspecified: Secondary | ICD-10-CM | POA: Insufficient documentation

## 2023-12-30 DIAGNOSIS — M25512 Pain in left shoulder: Secondary | ICD-10-CM | POA: Diagnosis not present

## 2023-12-30 DIAGNOSIS — M25511 Pain in right shoulder: Secondary | ICD-10-CM | POA: Diagnosis not present

## 2023-12-30 DIAGNOSIS — G8929 Other chronic pain: Secondary | ICD-10-CM

## 2023-12-30 DIAGNOSIS — G5793 Unspecified mononeuropathy of bilateral lower limbs: Secondary | ICD-10-CM | POA: Insufficient documentation

## 2023-12-30 DIAGNOSIS — M546 Pain in thoracic spine: Secondary | ICD-10-CM | POA: Diagnosis not present

## 2023-12-30 MED ORDER — HYDROCODONE-ACETAMINOPHEN 7.5-325 MG PO TABS: 2.0000 | ORAL_TABLET | Freq: Three times a day (TID) | ORAL | 0 refills | Status: DC | PRN

## 2023-12-30 NOTE — Progress Notes (Signed)
 Subjective:    Patient ID: [name], female    DOB: [birth year], 47 y.o.   MRN: [id number]  HPI: [name] is a 48 y.o. female who called office reporting she was ill, her appointment was changed to virtual visit. I connected with  Ms. Iris Hairston [facility] by a video enabled telemedicine application and verified that I am speaking with the correct person using two identifiers.  Location: Patient: In her Home Provider: In the Office    I discussed the limitations of evaluation and management by telemedicine and the availability of in person appointments. The patient expressed understanding and agreed to proceed.  She states her pain is located in her neck radiating into her bilateral shoulders, mid- lower back and bilateral hands and bilateral feet with tingling and burning. She rates her pain 8. Her current exercise regime is walking and performing stretching exercises.    Pain Inventory Average Pain 8 Pain Right Now 8 My pain is intermittent, sharp, burning, dull, stabbing, tingling, aching, and numbing  In the last 24 hours, has pain interfered with the following? General activity 8 Relation with others 7 Enjoyment of life 7 What TIME of day is your pain at its worst? morning , evening, night, and varies Sleep (in general) Fair  Pain is worse with: walking, bending, sitting, inactivity, standing, and some activites Pain improves with: rest, heat/ice, medication, and TENS Relief from Meds: 6  Family History  Problem Relation Age of Onset   Diabetes type II Mother    Colon polyps Mother    Diverticulitis Mother    Diabetes Mother    Heart disease Mother    Osteoporosis Father    Diabetes type II Brother    Uterine cancer Other    Ovarian cancer Other    COPD Paternal Grandfather    Heart attack Maternal Grandmother    Social History   Socioeconomic History   Marital status: Married    Spouse name: Not on file   Number of children: 2   Years of education:  Not on file   Highest education level: Not on file  Occupational History   Occupation: Disability    Employer: UNEMPLOYED  Tobacco Use   Smoking status: Former    Current packs/day: 0.00    Types: Cigarettes    Quit date: 06/30/2011    Years since quitting: 12.5   Smokeless tobacco: Never  Vaping Use   Vaping status: Some Days   Substances: Nicotine, Flavoring  Substance and Sexual Activity   Alcohol  use: No    Alcohol /week: 0.0 standard drinks of alcohol    Drug use: No   Sexual activity: Yes  Other Topics Concern   Not on file  Social History Narrative   Not on file   Social Drivers of Health   Financial Resource Strain: High Risk (06/10/2023)   Overall Financial Resource Strain (CARDIA)    Difficulty of Paying Living Expenses: Very hard  Food Insecurity: Food Insecurity Present (06/10/2023)   Hunger Vital Sign    Worried About Running Out of Food in the Last Year: Sometimes true    Ran Out of Food in the Last Year: Never true  Transportation Needs: No Transportation Needs (06/10/2023)   PRAPARE - Administrator, Civil Service (Medical): No    Lack of Transportation (Non-Medical): No  Physical Activity: Inactive (06/10/2023)   Exercise Vital Sign    Days of Exercise per Week: 7 days    Minutes of Exercise  per Session: 0 min  Stress: Stress Concern Present (06/10/2023)   [employer] of Occupational Health - Occupational Stress Questionnaire    Feeling of Stress : Rather much  Social Connections: Moderately Isolated (06/10/2023)   Social Connection and Isolation Panel [NHANES]    Frequency of Communication with Friends and Family: Three times a week    Frequency of Social Gatherings with Friends and Family: Once a week    Attends Religious Services: Never    Database administrator or Organizations: No    Attends Banker Meetings: Never    Marital Status: Married   Past Surgical History:  Procedure Laterality Date   BALLOON DILATION N/A  08/30/2023   Procedure: BALLOON DILATION;  Surgeon: Vinetta Greening, DO;  [facility];  Service: Endoscopy;  Laterality: N/A;   BIOPSY  08/30/2023   Procedure: BIOPSY;  Surgeon: Vinetta Greening, DO;  [facility];  Service: Endoscopy;;   BREAST BIOPSY Right 12/31/2016   FIBROCYSTIC CHANGES WITH CALCIFICATIONS/PSEUDOANGIOMATOUS STROMAL HYPERPLASIA (PASH)   CESAREAN SECTION     COLONOSCOPY WITH PROPOFOL  N/A 08/30/2023   Procedure: COLONOSCOPY WITH PROPOFOL ;  Surgeon: Vinetta Greening, DO;  [facility];  Service: Endoscopy;  Laterality: N/A;  800am, asa 3   ESOPHAGOGASTRODUODENOSCOPY (EGD) WITH PROPOFOL  N/A 08/30/2023   Procedure: ESOPHAGOGASTRODUODENOSCOPY (EGD) WITH PROPOFOL ;  Surgeon: Vinetta Greening, DO;  [facility];  Service: Endoscopy;  Laterality: N/A;   Left elbow surgery     PARTIAL HYSTERECTOMY     POLYPECTOMY  08/30/2023   Procedure: POLYPECTOMY;  Surgeon: Vinetta Greening, DO;  [facility];  Service: Endoscopy;;   TONSILLECTOMY     Past Surgical History:  Procedure Laterality Date   BALLOON DILATION N/A 08/30/2023   Procedure: BALLOON DILATION;  Surgeon: Vinetta Greening, DO;  [facility];  Service: Endoscopy;  Laterality: N/A;   BIOPSY  08/30/2023   Procedure: BIOPSY;  Surgeon: Vinetta Greening, DO;  [facility];  Service: Endoscopy;;   BREAST BIOPSY Right 12/31/2016   FIBROCYSTIC CHANGES WITH CALCIFICATIONS/PSEUDOANGIOMATOUS STROMAL HYPERPLASIA (PASH)   CESAREAN SECTION     COLONOSCOPY WITH PROPOFOL  N/A 08/30/2023   Procedure: COLONOSCOPY WITH PROPOFOL ;  Surgeon: Vinetta Greening, DO;  [facility];  Service: Endoscopy;  Laterality: N/A;  800am, asa 3   ESOPHAGOGASTRODUODENOSCOPY (EGD) WITH PROPOFOL  N/A 08/30/2023   Procedure: ESOPHAGOGASTRODUODENOSCOPY (EGD) WITH PROPOFOL ;  Surgeon: Vinetta Greening, DO;  [facility];  Service: Endoscopy;  Laterality: N/A;   Left  elbow surgery     PARTIAL HYSTERECTOMY     POLYPECTOMY  08/30/2023   Procedure: POLYPECTOMY;  Surgeon: Vinetta Greening, DO;  [facility];  Service: Endoscopy;;   TONSILLECTOMY     Past Medical History:  Diagnosis Date   Anxiety    Chronic pain    Dr. Sharl Davies   Degenerative disc disease    Spinal, small syrinx   Depression    Esophageal stricture    Status post dilatation   Fibromyalgia    GERD (gastroesophageal reflux disease)    Hiatal hernia    Internal hemorrhoids without mention of complication    Irritable bowel syndrome    Palpitations    No documented arrhythmias   Personal history of colonic polyps 10/04/2000   HYPERPLASTIC POLYP   POTS (postural orthostatic tachycardia syndrome)    Pulmonary embolus (HCC) 05/04/2023   Syncope  Negative tilt table test   Syringomyelia and syringobulbia (HCC)    BP 108/68 (BP Location: Left Arm, Patient Position: Sitting)   Pulse 96   Ht 5\' 6"  (1.676 m)   SpO2 98%   BMI 27.47 kg/m   Opioid Risk Score:   Fall Risk Score:  `1  Depression screen [facility] 2/9     12/30/2023    3:29 PM 11/30/2023    2:11 PM 10/15/2023    2:18 PM 09/03/2023    2:47 PM 06/10/2023    3:30 PM 06/01/2023    2:51 PM 04/22/2023    1:56 PM  Depression screen PHQ 2/9  Decreased Interest 1 1 1  0 3 0 1  Down, Depressed, Hopeless 1 1 1  0 3 0 1  PHQ - 2 Score 2 2 2  0 6 0 2  Altered sleeping 2    1    Tired, decreased energy 3    3    Change in appetite 0    0    Feeling bad or failure about yourself  3    3    Trouble concentrating 1    1    Moving slowly or fidgety/restless 0    1    Suicidal thoughts 0    0    PHQ-9 Score 11    15    Difficult doing work/chores Not difficult at all    Very difficult       Review of Systems     Objective:   Physical Exam Vitals and nursing note reviewed.  Musculoskeletal:     Comments: No Physical Exam: Virtual Visit         Assessment & Plan:  1. Lumbar degenerative disc disease:  12/30/2023. Continue with current treatment regimen: Refilled : HYDROcodone  7.5/325 mg two tablets every 8 hours as needed #180. Second script sent for the following month to accommodate scheduled appointment. We will continue the opioid monitoring program, this consists of regular clinic visits, examinations, urine drug screen, pill counts as well as use of [place]  Controlled Substance Reporting system. A 12 month History has been reviewed on the [place]  Controlled Substance Reporting System on 12/30/2023 2. Thoracic Spondylosis: Continue current treatment with HEP and  current medication regime. 12/30/2023. 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 Continue to Monitor. 12/30/2023.  Lyrica  discontinued due to adverse effects. Continue with exercise and heat therapy. 12/30/2023 4. Fibromyalgia:Continue with Heat and  exercise Regimen. Continue Gabapentin . 12/30/2023  5.Depression and anxiety: PCP Following. 12/30/2023 6. Orthostatic hypotension: Cardiology Following. 12/30/2023 7. Polyarthralgia: Continue HEP as tolerated . Continue to monitor. 12/30/2023 8. Chronic Bilateral  Shoulder Pain:  Continue current medication regime. Continue HEP as Tolerated. 05/29//2025. 9. Muscle Spasm:No complaints today.  Continue to monitor.  12/30/2023. 10. Neuropathic Pain: Continue Gabapentin . Continue to Monitor.  12/30/2023  11. Right elbow Pain: No complaints today. Continue current medication regimen. Continue to monitor. 12/30/2023 12. Bilateral  Knee with OA:.No complaints today. Continue HEP as Tolerated. Continue current medication regimen. Continue to monitor. 12/30/2023  13. Right Hip Pain: No complaints today. Continue with HEP as Tolerated. Continue to Monitor. 12/30/2023   F/U in 1 Month   Virtual Visit Established Patient Location of Patient: In her Home Location of Provider: In the Office

## 2023-12-30 NOTE — Progress Notes (Deleted)
 Subjective:    Patient ID: [name], female    DOB: 1976-02-09, 48 y.o.   MRN: [id number]  HPI  Pain Inventory Average Pain {NUMBERS; 0-10:5044} Pain Right Now {NUMBERS; 0-10:5044} My pain is {PAIN DESCRIPTION:21022940}  In the last 24 hours, has pain interfered with the following? General activity {NUMBERS; 0-10:5044} Relation with others {NUMBERS; 0-10:5044} Enjoyment of life {NUMBERS; 0-10:5044} What TIME of day is your pain at its worst? {time of day:24191} Sleep (in general) {BHH GOOD/FAIR/POOR:22877}  Pain is worse with: {ACTIVITIES:21022942} Pain improves with: {PAIN IMPROVES WJXB:14782956} Relief from Meds: {NUMBERS; 0-10:5044}  Family History  Problem Relation Age of Onset   Diabetes type II Mother    Colon polyps Mother    Diverticulitis Mother    Diabetes Mother    Heart disease Mother    Osteoporosis Father    Diabetes type II Brother    Uterine cancer Other    Ovarian cancer Other    COPD Paternal Grandfather    Heart attack Maternal Grandmother    Social History   Socioeconomic History   Marital status: Married    Spouse name: Not on file   Number of children: 2   Years of education: Not on file   Highest education level: Not on file  Occupational History   Occupation: Disability    Employer: UNEMPLOYED  Tobacco Use   Smoking status: Former    Current packs/day: 0.00    Types: Cigarettes    Quit date: 06/30/2011    Years since quitting: 12.5   Smokeless tobacco: Never  Vaping Use   Vaping status: Some Days   Substances: Nicotine, Flavoring  Substance and Sexual Activity   Alcohol  use: No    Alcohol /week: 0.0 standard drinks of alcohol    Drug use: No   Sexual activity: Yes  Other Topics Concern   Not on file  Social History Narrative   Not on file   Social Drivers of Health   Financial Resource Strain: High Risk (06/10/2023)   Overall Financial Resource Strain (CARDIA)    Difficulty of Paying Living Expenses: Very hard  Food  Insecurity: Food Insecurity Present (06/10/2023)   Hunger Vital Sign    Worried About Running Out of Food in the Last Year: Sometimes true    Ran Out of Food in the Last Year: Never true  Transportation Needs: No Transportation Needs (06/10/2023)   PRAPARE - Administrator, Civil Service (Medical): No    Lack of Transportation (Non-Medical): No  Physical Activity: Inactive (06/10/2023)   Exercise Vital Sign    Days of Exercise per Week: 7 days    Minutes of Exercise per Session: 0 min  Stress: Stress Concern Present (06/10/2023)   [employer] of Occupational Health - Occupational Stress Questionnaire    Feeling of Stress : Rather much  Social Connections: Moderately Isolated (06/10/2023)   Social Connection and Isolation Panel [NHANES]    Frequency of Communication with Friends and Family: Three times a week    Frequency of Social Gatherings with Friends and Family: Once a week    Attends Religious Services: Never    Database administrator or Organizations: No    Attends Banker Meetings: Never    Marital Status: Married   Past Surgical History:  Procedure Laterality Date   BALLOON DILATION N/A 08/30/2023   Procedure: BALLOON DILATION;  Surgeon: Vinetta Greening, DO;  [facility];  Service: Endoscopy;  Laterality: N/A;   BIOPSY  08/30/2023   Procedure: BIOPSY;  Surgeon: Vinetta Greening, DO;  [facility];  Service: Endoscopy;;   BREAST BIOPSY Right 12/31/2016   FIBROCYSTIC CHANGES WITH CALCIFICATIONS/PSEUDOANGIOMATOUS STROMAL HYPERPLASIA (PASH)   CESAREAN SECTION     COLONOSCOPY WITH PROPOFOL  N/A 08/30/2023   Procedure: COLONOSCOPY WITH PROPOFOL ;  Surgeon: Vinetta Greening, DO;  [facility];  Service: Endoscopy;  Laterality: N/A;  800am, asa 3   ESOPHAGOGASTRODUODENOSCOPY (EGD) WITH PROPOFOL  N/A 08/30/2023   Procedure: ESOPHAGOGASTRODUODENOSCOPY (EGD) WITH PROPOFOL ;  Surgeon: Vinetta Greening, DO;  [facility];  Service: Endoscopy;  Laterality: N/A;   Left elbow surgery     PARTIAL HYSTERECTOMY     POLYPECTOMY  08/30/2023   Procedure: POLYPECTOMY;  Surgeon: Vinetta Greening, DO;  [facility];  Service: Endoscopy;;   TONSILLECTOMY     Past Surgical History:  Procedure Laterality Date   BALLOON DILATION N/A 08/30/2023   Procedure: BALLOON DILATION;  Surgeon: Vinetta Greening, DO;  [facility];  Service: Endoscopy;  Laterality: N/A;   BIOPSY  08/30/2023   Procedure: BIOPSY;  Surgeon: Vinetta Greening, DO;  [facility];  Service: Endoscopy;;   BREAST BIOPSY Right 12/31/2016   FIBROCYSTIC CHANGES WITH CALCIFICATIONS/PSEUDOANGIOMATOUS STROMAL HYPERPLASIA (PASH)   CESAREAN SECTION     COLONOSCOPY WITH PROPOFOL  N/A 08/30/2023   Procedure: COLONOSCOPY WITH PROPOFOL ;  Surgeon: Vinetta Greening, DO;  [facility];  Service: Endoscopy;  Laterality: N/A;  800am, asa 3   ESOPHAGOGASTRODUODENOSCOPY (EGD) WITH PROPOFOL  N/A 08/30/2023   Procedure: ESOPHAGOGASTRODUODENOSCOPY (EGD) WITH PROPOFOL ;  Surgeon: Vinetta Greening, DO;  [facility];  Service: Endoscopy;  Laterality: N/A;   Left elbow surgery     PARTIAL HYSTERECTOMY     POLYPECTOMY  08/30/2023   Procedure: POLYPECTOMY;  Surgeon: Vinetta Greening, DO;  [facility];  Service: Endoscopy;;   TONSILLECTOMY     Past Medical History:  Diagnosis Date   Anxiety    Chronic pain    Dr. Sharl Davies   Degenerative disc disease    Spinal, small syrinx   Depression    Esophageal stricture    Status post dilatation   Fibromyalgia    GERD (gastroesophageal reflux disease)    Hiatal hernia    Internal hemorrhoids without mention of complication    Irritable bowel syndrome    Palpitations    No documented arrhythmias   Personal history of colonic polyps 10/04/2000   HYPERPLASTIC POLYP   POTS (postural orthostatic tachycardia syndrome)    Pulmonary embolus (HCC) 05/04/2023    Syncope    Negative tilt table test   Syringomyelia and syringobulbia (HCC)    There were no vitals taken for this visit.  Opioid Risk Score:   Fall Risk Score:  `1  Depression screen PHQ 2/9     11/30/2023    2:11 PM 10/15/2023    2:18 PM 09/03/2023    2:47 PM 06/10/2023    3:30 PM 06/01/2023    2:51 PM 04/22/2023    1:56 PM 01/18/2023    3:48 PM  Depression screen PHQ 2/9  Decreased Interest 1 1 0 3 0 1 1  Down, Depressed, Hopeless 1 1 0 3 0 1 1  PHQ - 2 Score 2 2 0 6 0 2 2  Altered sleeping    1     Tired, decreased energy    3     Change in  appetite    0     Feeling bad or failure about yourself     3     Trouble concentrating    1     Moving slowly or fidgety/restless    1     Suicidal thoughts    0     PHQ-9 Score    15     Difficult doing work/chores    Very difficult        Review of Systems     Objective:   Physical Exam        Assessment & Plan:

## 2024-01-24 ENCOUNTER — Other Ambulatory Visit: Payer: Self-pay | Admitting: Registered Nurse

## 2024-01-25 ENCOUNTER — Encounter: Attending: Physical Medicine & Rehabilitation | Admitting: Registered Nurse

## 2024-01-25 ENCOUNTER — Encounter: Payer: Self-pay | Admitting: Registered Nurse

## 2024-01-25 VITALS — BP 103/70 | HR 96 | Ht 66.0 in | Wt 163.8 lb

## 2024-01-25 DIAGNOSIS — M25511 Pain in right shoulder: Secondary | ICD-10-CM | POA: Insufficient documentation

## 2024-01-25 DIAGNOSIS — M5416 Radiculopathy, lumbar region: Secondary | ICD-10-CM | POA: Diagnosis not present

## 2024-01-25 DIAGNOSIS — M5412 Radiculopathy, cervical region: Secondary | ICD-10-CM | POA: Insufficient documentation

## 2024-01-25 DIAGNOSIS — M797 Fibromyalgia: Secondary | ICD-10-CM | POA: Insufficient documentation

## 2024-01-25 DIAGNOSIS — G5793 Unspecified mononeuropathy of bilateral lower limbs: Secondary | ICD-10-CM | POA: Insufficient documentation

## 2024-01-25 DIAGNOSIS — G8929 Other chronic pain: Secondary | ICD-10-CM | POA: Diagnosis not present

## 2024-01-25 DIAGNOSIS — M25512 Pain in left shoulder: Secondary | ICD-10-CM | POA: Diagnosis not present

## 2024-01-25 DIAGNOSIS — M542 Cervicalgia: Secondary | ICD-10-CM | POA: Insufficient documentation

## 2024-01-25 DIAGNOSIS — M792 Neuralgia and neuritis, unspecified: Secondary | ICD-10-CM | POA: Diagnosis not present

## 2024-01-25 MED ORDER — HYDROCODONE-ACETAMINOPHEN 7.5-325 MG PO TABS: 2.0000 | ORAL_TABLET | Freq: Three times a day (TID) | ORAL | 0 refills | Status: DC | PRN

## 2024-01-25 NOTE — Progress Notes (Signed)
 Subjective:    Patient ID: [name], female    DOB: 02/12/1976, 48 y.o.   MRN: [id number]  HPI: [name] is a 48 y.o. female who returns for follow up appointment for chronic pain and medication refill.She  states her pain is located in her  neck radiating into her bilateral shoulders, lower back pain radiating into her right lower extremity. She also reports bilateral hands and bilateral feet with tingling and burning.  She reports she has a headache for the last two days, she reports she has taken her medication and called her PCP, she reports she will go to Urgent care today, she also states she is having popping in her right ear and sinus congestion. She will send this provider an update on the above.  She rates her  pain 7. Her current exercise regime is walking and performing stretching exercises.  [name] Morphine equivalent is 45.00 MME.She is also prescribed Clonazepam by Dr. Bertell .We have discussed the black box warning of using opioids and benzodiazepines. I highlighted the dangers of using these drugs together and discussed the adverse events including respiratory suppression, overdose, cognitive impairment and importance of compliance with current regimen. We will continue to monitor and adjust as indicated.       Last UDs was Performed on 10/15/2023, it was consistent,.  Pain Inventory Average Pain 7 Pain Right Now 7 My pain is intermittent, sharp, burning, dull, stabbing, tingling, and aching  In the last 24 hours, has pain interfered with the following? General activity 7 Relation with others 7 Enjoyment of life 7 What TIME of day is your pain at its worst? morning , evening, and night Sleep (in general) Fair  Pain is worse with: walking, bending, sitting, inactivity, standing, unsure, and some activites Pain improves with: rest, heat/ice, and medication Relief from Meds: 5  Family History  Problem Relation Age of Onset   Diabetes type II Mother     Colon polyps Mother    Diverticulitis Mother    Diabetes Mother    Heart disease Mother    Osteoporosis Father    Diabetes type II Brother    Uterine cancer Other    Ovarian cancer Other    COPD Paternal Grandfather    Heart attack Maternal Grandmother    Social History   Socioeconomic History   Marital status: Married    Spouse name: Not on file   Number of children: 2   Years of education: Not on file   Highest education level: Not on file  Occupational History   Occupation: Disability    Employer: UNEMPLOYED  Tobacco Use   Smoking status: Former    Current packs/day: 0.00    Types: Cigarettes    Quit date: 06/30/2011    Years since quitting: 12.5   Smokeless tobacco: Never  Vaping Use   Vaping status: Some Days   Substances: Nicotine, Flavoring  Substance and Sexual Activity   Alcohol  use: No    Alcohol /week: 0.0 standard drinks of alcohol    Drug use: No   Sexual activity: Yes  Other Topics Concern   Not on file  Social History Narrative   Not on file   Social Drivers of Health   Financial Resource Strain: High Risk (06/10/2023)   Overall Financial Resource Strain (CARDIA)    Difficulty of Paying Living Expenses: Very hard  Food Insecurity: Food Insecurity Present (06/10/2023)   Hunger Vital Sign    Worried About Radiation protection practitioner of Food  in the Last Year: Sometimes true    Ran Out of Food in the Last Year: Never true  Transportation Needs: No Transportation Needs (06/10/2023)   PRAPARE - Administrator, Civil Service (Medical): No    Lack of Transportation (Non-Medical): No  Physical Activity: Inactive (06/10/2023)   Exercise Vital Sign    Days of Exercise per Week: 7 days    Minutes of Exercise per Session: 0 min  Stress: Stress Concern Present (06/10/2023)   [employer] of Occupational Health - Occupational Stress Questionnaire    Feeling of Stress : Rather much  Social Connections: Moderately Isolated (06/10/2023)   Social Connection and  Isolation Panel    Frequency of Communication with Friends and Family: Three times a week    Frequency of Social Gatherings with Friends and Family: Once a week    Attends Religious Services: Never    Database administrator or Organizations: No    Attends Banker Meetings: Never    Marital Status: Married   Past Surgical History:  Procedure Laterality Date   BALLOON DILATION N/A 08/30/2023   Procedure: BALLOON DILATION;  Surgeon: Cindie Carlin POUR, DO;  [facility];  Service: Endoscopy;  Laterality: N/A;   BIOPSY  08/30/2023   Procedure: BIOPSY;  Surgeon: Cindie Carlin POUR, DO;  [facility];  Service: Endoscopy;;   BREAST BIOPSY Right 12/31/2016   FIBROCYSTIC CHANGES WITH CALCIFICATIONS/PSEUDOANGIOMATOUS STROMAL HYPERPLASIA (PASH)   CESAREAN SECTION     COLONOSCOPY WITH PROPOFOL  N/A 08/30/2023   Procedure: COLONOSCOPY WITH PROPOFOL ;  Surgeon: Cindie Carlin POUR, DO;  [facility];  Service: Endoscopy;  Laterality: N/A;  800am, asa 3   ESOPHAGOGASTRODUODENOSCOPY (EGD) WITH PROPOFOL  N/A 08/30/2023   Procedure: ESOPHAGOGASTRODUODENOSCOPY (EGD) WITH PROPOFOL ;  Surgeon: Cindie Carlin POUR, DO;  [facility];  Service: Endoscopy;  Laterality: N/A;   Left elbow surgery     PARTIAL HYSTERECTOMY     POLYPECTOMY  08/30/2023   Procedure: POLYPECTOMY;  Surgeon: Cindie Carlin POUR, DO;  [facility];  Service: Endoscopy;;   TONSILLECTOMY     Past Surgical History:  Procedure Laterality Date   BALLOON DILATION N/A 08/30/2023   Procedure: BALLOON DILATION;  Surgeon: Cindie Carlin POUR, DO;  [facility];  Service: Endoscopy;  Laterality: N/A;   BIOPSY  08/30/2023   Procedure: BIOPSY;  Surgeon: Cindie Carlin POUR, DO;  [facility];  Service: Endoscopy;;   BREAST BIOPSY Right 12/31/2016   FIBROCYSTIC CHANGES WITH CALCIFICATIONS/PSEUDOANGIOMATOUS STROMAL HYPERPLASIA (PASH)   CESAREAN SECTION     COLONOSCOPY WITH  PROPOFOL  N/A 08/30/2023   Procedure: COLONOSCOPY WITH PROPOFOL ;  Surgeon: Cindie Carlin POUR, DO;  [facility];  Service: Endoscopy;  Laterality: N/A;  800am, asa 3   ESOPHAGOGASTRODUODENOSCOPY (EGD) WITH PROPOFOL  N/A 08/30/2023   Procedure: ESOPHAGOGASTRODUODENOSCOPY (EGD) WITH PROPOFOL ;  Surgeon: Cindie Carlin POUR, DO;  [facility];  Service: Endoscopy;  Laterality: N/A;   Left elbow surgery     PARTIAL HYSTERECTOMY     POLYPECTOMY  08/30/2023   Procedure: POLYPECTOMY;  Surgeon: Cindie Carlin POUR, DO;  [facility];  Service: Endoscopy;;   TONSILLECTOMY     Past Medical History:  Diagnosis Date   Anxiety    Chronic pain    Dr. Carilyn   Degenerative disc disease    Spinal, small syrinx   Depression    Esophageal stricture    Status post dilatation  Fibromyalgia    GERD (gastroesophageal reflux disease)    Hiatal hernia    Internal hemorrhoids without mention of complication    Irritable bowel syndrome    Palpitations    No documented arrhythmias   Personal history of colonic polyps 10/04/2000   HYPERPLASTIC POLYP   POTS (postural orthostatic tachycardia syndrome)    Pulmonary embolus (HCC) 05/04/2023   Syncope    Negative tilt table test   Syringomyelia and syringobulbia (HCC)    BP 103/70 (BP Location: Left Arm, Patient Position: Sitting, Cuff Size: Large)   Pulse 96   Ht 5' 6 (1.676 m)   Wt 163 lb 12.8 oz (74.3 kg)   SpO2 98%   BMI 26.44 kg/m   Opioid Risk Score:   Fall Risk Score:  `1  Depression screen [facility] 2/9     01/25/2024    3:19 PM 12/30/2023    3:29 PM 11/30/2023    2:11 PM 10/15/2023    2:18 PM 09/03/2023    2:47 PM 06/10/2023    3:30 PM 06/01/2023    2:51 PM  Depression screen PHQ 2/9  Decreased Interest 0 1 1 1  0 3 0  Down, Depressed, Hopeless 0 1 1 1  0 3 0  PHQ - 2 Score 0 2 2 2  0 6 0  Altered sleeping 0 2    1   Tired, decreased energy 0 3    3   Change in appetite 0 0    0   Feeling bad or failure about  yourself  0 3    3   Trouble concentrating 0 1    1   Moving slowly or fidgety/restless 0 0    1   Suicidal thoughts 0 0    0   PHQ-9 Score 0 11    15   Difficult doing work/chores Somewhat difficult Not difficult at all    Very difficult       Review of Systems  Musculoskeletal:  Positive for back pain, myalgias and neck pain.       Shoulder pain, back pain radiating into hip and right leg, bilateral foot pain, right arm pain, neck pain, headaches  Neurological:  Positive for headaches.  All other systems reviewed and are negative.      Objective:   Physical Exam Vitals and nursing note reviewed.  Constitutional:      Appearance: Normal appearance.   Cardiovascular:     Rate and Rhythm: Normal rate and regular rhythm.     Pulses: Normal pulses.     Heart sounds: Normal heart sounds.  Pulmonary:     Effort: Pulmonary effort is normal.     Breath sounds: Normal breath sounds.   Musculoskeletal:     Comments: Normal Muscle Bulk and Muscle Testing Reveals:  Upper Extremities: Full ROM and Muscle Strength 5/5 Bilateral AC Joint Tenderness  Lumbar Paraspinal Tenderness: L-4-L-5 Lower Extremities: Full ROM and Muscle Strength 5/5 Arises from Table slowly Narrow  Based Gait      Skin:    General: Skin is warm and dry.   Neurological:     Mental Status: She is alert and oriented to person, place, and time.   Psychiatric:        Mood and Affect: Mood normal.        Behavior: Behavior normal.         Assessment & Plan:  1. Lumbar degenerative disc disease: 01/25/2024. Continue with current treatment regimen: Refilled : HYDROcodone  7.5/325  mg two tablets every 8 hours as needed #180. Second script sent for the following month to accommodate scheduled appointment. We will continue the opioid monitoring program, this consists of regular clinic visits, examinations, urine drug screen, pill counts as well as use of [place]  Controlled Substance Reporting system. A 12  month History has been reviewed on the [place]  Controlled Substance Reporting System on 01/25/2024 2. Thoracic Spondylosis: Continue current treatment with HEP and  current medication regime. 01/25/2024. 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 Continue to Monitor. 01/25/2024.  Lyrica  discontinued due to adverse effects. Continue with exercise and heat therapy. 01/25/2024 4. Fibromyalgia:Continue with Heat and  exercise Regimen. Continue Gabapentin . 01/25/2024  5.Depression and anxiety: PCP Following. 01/25/2024 6. Orthostatic hypotension: Cardiology Following. 01/25/2024 7. Polyarthralgia: Continue HEP as tolerated . Continue to monitor. 01/25/2024 8. Chronic Bilateral  Shoulder Pain:  Continue current medication regime. Continue HEP as Tolerated. 06/24//2025. 9. Muscle Spasm:No complaints today.  Continue to monitor.  01/25/2024. 10. Neuropathic Pain: Continue Gabapentin . Continue to Monitor.  01/25/2024  11. Right elbow Pain: No complaints today. Continue current medication regimen. Continue to monitor. 01/25/2024 12. Bilateral  Knee with OA:.No complaints today. Continue HEP as Tolerated. Continue current medication regimen. Continue to monitor. 01/25/2024  13. Right Hip Pain: No complaints today. Continue with HEP as Tolerated. Continue to Monitor. 01/25/2024   F/U in 1 Month

## 2024-02-13 ENCOUNTER — Other Ambulatory Visit: Payer: Self-pay | Admitting: Registered Nurse

## 2024-02-23 ENCOUNTER — Other Ambulatory Visit: Payer: Self-pay | Admitting: Registered Nurse

## 2024-02-23 NOTE — Telephone Encounter (Signed)
 Prescription should be at pharmacy.  My Chart message sent to Ms. Scrivener regarding the above.

## 2024-02-25 DIAGNOSIS — E559 Vitamin D deficiency, unspecified: Secondary | ICD-10-CM | POA: Diagnosis not present

## 2024-02-25 DIAGNOSIS — Z86711 Personal history of pulmonary embolism: Secondary | ICD-10-CM | POA: Diagnosis not present

## 2024-02-25 DIAGNOSIS — E2749 Other adrenocortical insufficiency: Secondary | ICD-10-CM | POA: Diagnosis not present

## 2024-02-25 DIAGNOSIS — Z5986 Financial insecurity: Secondary | ICD-10-CM | POA: Diagnosis not present

## 2024-02-25 DIAGNOSIS — G894 Chronic pain syndrome: Secondary | ICD-10-CM | POA: Diagnosis not present

## 2024-02-25 DIAGNOSIS — Z79899 Other long term (current) drug therapy: Secondary | ICD-10-CM | POA: Diagnosis not present

## 2024-02-25 DIAGNOSIS — Z0001 Encounter for general adult medical examination with abnormal findings: Secondary | ICD-10-CM | POA: Diagnosis not present

## 2024-02-25 DIAGNOSIS — I10 Essential (primary) hypertension: Secondary | ICD-10-CM | POA: Diagnosis not present

## 2024-02-25 DIAGNOSIS — G9332 Myalgic encephalomyelitis/chronic fatigue syndrome: Secondary | ICD-10-CM | POA: Diagnosis not present

## 2024-03-13 NOTE — Progress Notes (Signed)
 Subjective:    Patient ID: [name], female    DOB: 05-Oct-1975, 48 y.o.   MRN: [id number]  HPI: [name] is a 48 y.o. female who returns for follow up appointment for chronic pain and medication refill. She states her pain is located in her neck radiating into her bilateral shoulders, mid- lower back and bilateral hands and bilateral feet with tingling and burning. She rates her pain 6. Her current exercise regime is walking and performing stretching exercises.  [name] Morphine equivalent is 45.00 MME.She is also prescribed Clonazepam  by Dr. Bertell .We have discussed the black box warning of using opioids and benzodiazepines. I highlighted the dangers of using these drugs together and discussed the adverse events including respiratory suppression, overdose, cognitive impairment and importance of compliance with current regimen. We will continue to monitor and adjust as indicated.    Oral Swab was Performed Today.   [name] reports at times her upper and lower extremities are purplish in color, not noted today. She will F/U with her PCP to schedule an appointment with Vascular. She verbalizes understanding.    Pain Inventory Average Pain 7 Pain Right Now 6 My pain is intermittent, sharp, burning, dull, stabbing, tingling, and aching  In the last 24 hours, has pain interfered with the following? General activity 7 Relation with others 7 Enjoyment of life 7 What TIME of day is your pain at its worst? morning , evening, and night Sleep (in general) Fair  Pain is worse with: walking, bending, sitting, inactivity, standing, unsure, and some activites Pain improves with: rest, heat/ice, and medication Relief from Meds: 6  Family History  Problem Relation Age of Onset   Diabetes type II Mother    Colon polyps Mother    Diverticulitis Mother    Diabetes Mother    Heart disease Mother    Osteoporosis Father    Diabetes type II Brother    Uterine cancer Other     Ovarian cancer Other    COPD Paternal Grandfather    Heart attack Maternal Grandmother    Social History   Socioeconomic History   Marital status: Married    Spouse name: Not on file   Number of children: 2   Years of education: Not on file   Highest education level: Not on file  Occupational History   Occupation: Disability    Employer: UNEMPLOYED  Tobacco Use   Smoking status: Former    Current packs/day: 0.00    Types: Cigarettes    Quit date: 06/30/2011    Years since quitting: 12.7   Smokeless tobacco: Never  Vaping Use   Vaping status: Some Days   Substances: Nicotine, Flavoring  Substance and Sexual Activity   Alcohol  use: No    Alcohol /week: 0.0 standard drinks of alcohol    Drug use: No   Sexual activity: Yes  Other Topics Concern   Not on file  Social History Narrative   Not on file   Social Drivers of Health   Financial Resource Strain: High Risk (06/10/2023)   Overall Financial Resource Strain (CARDIA)    Difficulty of Paying Living Expenses: Very hard  Food Insecurity: Food Insecurity Present (06/10/2023)   Hunger Vital Sign    Worried About Running Out of Food in the Last Year: Sometimes true    Ran Out of Food in the Last Year: Never true  Transportation Needs: No Transportation Needs (06/10/2023)   PRAPARE - Transportation    Lack of Transportation (  Medical): No    Lack of Transportation (Non-Medical): No  Physical Activity: Inactive (06/10/2023)   Exercise Vital Sign    Days of Exercise per Week: 7 days    Minutes of Exercise per Session: 0 min  Stress: Stress Concern Present (06/10/2023)   [employer] of Occupational Health - Occupational Stress Questionnaire    Feeling of Stress : Rather much  Social Connections: Moderately Isolated (06/10/2023)   Social Connection and Isolation Panel    Frequency of Communication with Friends and Family: Three times a week    Frequency of Social Gatherings with Friends and Family: Once a week    Attends  Religious Services: Never    Database administrator or Organizations: No    Attends Banker Meetings: Never    Marital Status: Married   Past Surgical History:  Procedure Laterality Date   BALLOON DILATION N/A 08/30/2023   Procedure: BALLOON DILATION;  Surgeon: Cindie Carlin POUR, DO;  [facility];  Service: Endoscopy;  Laterality: N/A;   BIOPSY  08/30/2023   Procedure: BIOPSY;  Surgeon: Cindie Carlin POUR, DO;  [facility];  Service: Endoscopy;;   BREAST BIOPSY Right 12/31/2016   FIBROCYSTIC CHANGES WITH CALCIFICATIONS/PSEUDOANGIOMATOUS STROMAL HYPERPLASIA (PASH)   CESAREAN SECTION     COLONOSCOPY WITH PROPOFOL  N/A 08/30/2023   Procedure: COLONOSCOPY WITH PROPOFOL ;  Surgeon: Cindie Carlin POUR, DO;  [facility];  Service: Endoscopy;  Laterality: N/A;  800am, asa 3   ESOPHAGOGASTRODUODENOSCOPY (EGD) WITH PROPOFOL  N/A 08/30/2023   Procedure: ESOPHAGOGASTRODUODENOSCOPY (EGD) WITH PROPOFOL ;  Surgeon: Cindie Carlin POUR, DO;  [facility];  Service: Endoscopy;  Laterality: N/A;   Left elbow surgery     PARTIAL HYSTERECTOMY     POLYPECTOMY  08/30/2023   Procedure: POLYPECTOMY;  Surgeon: Cindie Carlin POUR, DO;  [facility];  Service: Endoscopy;;   TONSILLECTOMY     Past Surgical History:  Procedure Laterality Date   BALLOON DILATION N/A 08/30/2023   Procedure: BALLOON DILATION;  Surgeon: Cindie Carlin POUR, DO;  [facility];  Service: Endoscopy;  Laterality: N/A;   BIOPSY  08/30/2023   Procedure: BIOPSY;  Surgeon: Cindie Carlin POUR, DO;  [facility];  Service: Endoscopy;;   BREAST BIOPSY Right 12/31/2016   FIBROCYSTIC CHANGES WITH CALCIFICATIONS/PSEUDOANGIOMATOUS STROMAL HYPERPLASIA (PASH)   CESAREAN SECTION     COLONOSCOPY WITH PROPOFOL  N/A 08/30/2023   Procedure: COLONOSCOPY WITH PROPOFOL ;  Surgeon: Cindie Carlin POUR, DO;  [facility];  Service: Endoscopy;  Laterality: N/A;  800am, asa 3    ESOPHAGOGASTRODUODENOSCOPY (EGD) WITH PROPOFOL  N/A 08/30/2023   Procedure: ESOPHAGOGASTRODUODENOSCOPY (EGD) WITH PROPOFOL ;  Surgeon: Cindie Carlin POUR, DO;  [facility];  Service: Endoscopy;  Laterality: N/A;   Left elbow surgery     PARTIAL HYSTERECTOMY     POLYPECTOMY  08/30/2023   Procedure: POLYPECTOMY;  Surgeon: Cindie Carlin POUR, DO;  [facility];  Service: Endoscopy;;   TONSILLECTOMY     Past Medical History:  Diagnosis Date   Anxiety    Chronic pain    Dr. Carilyn   Degenerative disc disease    Spinal, small syrinx   Depression    Esophageal stricture    Status post dilatation   Fibromyalgia    GERD (gastroesophageal reflux disease)    Hiatal hernia    Internal hemorrhoids without mention of complication    Irritable bowel syndrome    Palpitations  No documented arrhythmias   Personal history of colonic polyps 10/04/2000   HYPERPLASTIC POLYP   POTS (postural orthostatic tachycardia syndrome)    Pulmonary embolus (HCC) 05/04/2023   Syncope    Negative tilt table test   Syringomyelia and syringobulbia (HCC)    There were no vitals taken for this visit.  Opioid Risk Score:   Fall Risk Score:  `1  Depression screen [facility] 2/9     01/25/2024    3:19 PM 12/30/2023    3:29 PM 11/30/2023    2:11 PM 10/15/2023    2:18 PM 09/03/2023    2:47 PM 06/10/2023    3:30 PM 06/01/2023    2:51 PM  Depression screen PHQ 2/9  Decreased Interest 0 1 1 1  0 3 0  Down, Depressed, Hopeless 0 1 1 1  0 3 0  PHQ - 2 Score 0 2 2 2  0 6 0  Altered sleeping 0 2    1   Tired, decreased energy 0 3    3   Change in appetite 0 0    0   Feeling bad or failure about yourself  0 3    3   Trouble concentrating 0 1    1   Moving slowly or fidgety/restless 0 0    1   Suicidal thoughts 0 0    0   PHQ-9 Score 0 11    15   Difficult doing work/chores Somewhat difficult Not difficult at all    Very difficult     Review of Systems  Musculoskeletal:  Positive for back pain and  neck pain.       Pain in both hands, both feet, right elbow, right shoulder  Neurological:  Positive for headaches.  All other systems reviewed and are negative.      Objective:   Physical Exam Constitutional:      Appearance: Normal appearance.  Cardiovascular:     Rate and Rhythm: Normal rate and regular rhythm.     Pulses: Normal pulses.     Heart sounds: Normal heart sounds.  Pulmonary:     Effort: Pulmonary effort is normal.     Breath sounds: Normal breath sounds.  Musculoskeletal:     Comments: Normal Muscle Bulk and Muscle Testing Reveals:  Upper Extremities: Full ROM and Muscle Strength 5/5 Lumbar Paraspinal Tenderness: L-3-L-5 Lower Extremities: Full ROM and Muscle Strength 5/5 Arises from Table with ease Narrow Based Gait     Skin:    General: Skin is warm and dry.  Neurological:     Mental Status: She is alert and oriented to person, place, and time.  Psychiatric:        Mood and Affect: Mood normal.        Behavior: Behavior normal.          Assessment & Plan:  1. Lumbar degenerative disc disease: 03/14/2024. Continue with current treatment regimen: Refilled : HYDROcodone  7.5/325 mg two tablets every 8 hours as needed #180. Second script sent for the following month to accommodate scheduled appointment. We will continue the opioid monitoring program, this consists of regular clinic visits, examinations, urine drug screen, pill counts as well as use of [place]  Controlled Substance Reporting system. A 12 month History has been reviewed on the [place]  Controlled Substance Reporting System on 03/14/2024 2. Thoracic Spondylosis: Continue current treatment with HEP and  current medication regime. 03/14/2024. 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 Continue to Monitor.  03/14/2024.  Lyrica  discontinued due to adverse effects. Continue with exercise and heat therapy. 03/14/2024 4.  Fibromyalgia:Continue with Heat and  exercise Regimen. Continue Gabapentin . 03/14/2024  5.Depression and anxiety: PCP Following. 03/14/2024 6. Orthostatic hypotension: Cardiology Following. 03/14/2024 7. Polyarthralgia: Continue HEP as tolerated . Continue to monitor. 03/14/2024 8. Chronic Bilateral  Shoulder Pain: Continue current medication regime. Continue HEP as Tolerated. 08/12//2025. 9. Muscle Spasm:No complaints today.  Continue to monitor.  03/14/2024. 10. Neuropathic Pain: Continue Gabapentin . Continue to Monitor.  03/14/2024  11. Right elbow Pain: No complaints today. Continue current medication regimen. Continue to monitor. 03/14/2024 12. Bilateral  Knee with OA:.No complaints today. Continue HEP as Tolerated. Continue current medication regimen. Continue to monitor. 03/14/2024  13. Right Hip Pain: No complaints today. Continue with HEP as Tolerated. Continue to Monitor. [id number]   F/U in 1 Month

## 2024-03-14 ENCOUNTER — Encounter: Payer: Self-pay | Admitting: Registered Nurse

## 2024-03-14 ENCOUNTER — Encounter: Attending: Physical Medicine & Rehabilitation | Admitting: Registered Nurse

## 2024-03-14 VITALS — BP 117/81 | HR 100 | Ht 66.0 in | Wt 153.0 lb

## 2024-03-14 DIAGNOSIS — M797 Fibromyalgia: Secondary | ICD-10-CM | POA: Insufficient documentation

## 2024-03-14 DIAGNOSIS — G5793 Unspecified mononeuropathy of bilateral lower limbs: Secondary | ICD-10-CM | POA: Insufficient documentation

## 2024-03-14 DIAGNOSIS — M5412 Radiculopathy, cervical region: Secondary | ICD-10-CM | POA: Diagnosis not present

## 2024-03-14 DIAGNOSIS — M792 Neuralgia and neuritis, unspecified: Secondary | ICD-10-CM | POA: Diagnosis not present

## 2024-03-14 DIAGNOSIS — M546 Pain in thoracic spine: Secondary | ICD-10-CM | POA: Insufficient documentation

## 2024-03-14 DIAGNOSIS — G894 Chronic pain syndrome: Secondary | ICD-10-CM | POA: Diagnosis not present

## 2024-03-14 DIAGNOSIS — Z5181 Encounter for therapeutic drug level monitoring: Secondary | ICD-10-CM | POA: Insufficient documentation

## 2024-03-14 DIAGNOSIS — M25511 Pain in right shoulder: Secondary | ICD-10-CM | POA: Diagnosis not present

## 2024-03-14 DIAGNOSIS — M545 Low back pain, unspecified: Secondary | ICD-10-CM | POA: Diagnosis not present

## 2024-03-14 DIAGNOSIS — Z79891 Long term (current) use of opiate analgesic: Secondary | ICD-10-CM | POA: Insufficient documentation

## 2024-03-14 DIAGNOSIS — G8929 Other chronic pain: Secondary | ICD-10-CM | POA: Insufficient documentation

## 2024-03-14 DIAGNOSIS — M25512 Pain in left shoulder: Secondary | ICD-10-CM | POA: Diagnosis not present

## 2024-03-14 DIAGNOSIS — M542 Cervicalgia: Secondary | ICD-10-CM | POA: Diagnosis not present

## 2024-03-14 MED ORDER — HYDROCODONE-ACETAMINOPHEN 7.5-325 MG PO TABS: 2.0000 | ORAL_TABLET | Freq: Three times a day (TID) | ORAL | 0 refills | Status: DC | PRN

## 2024-03-17 LAB — DRUG TOX MONITOR 1 W/CONF, ORAL FLD
Alprazolam: NEGATIVE ng/mL (ref <0.50)
Aminoclonazepam: 4.19 ng/mL — ABNORMAL HIGH (ref <0.50)
Amobarbital: NEGATIVE ng/mL (ref <10)
Amphetamine: 169 ng/mL — ABNORMAL HIGH (ref <10)
Amphetamines: POSITIVE ng/mL — AB (ref <10)
Barbiturates: POSITIVE ng/mL — AB (ref <10)
Benzodiazepines: POSITIVE ng/mL — AB (ref <0.50)
Buprenorphine: NEGATIVE ng/mL (ref <0.10)
Butalbital: >500 ng/mL — ABNORMAL HIGH (ref <10)
Chlordiazepoxide: NEGATIVE ng/mL (ref <0.50)
Clonazepam: 0.85 ng/mL — ABNORMAL HIGH (ref <0.50)
Cocaine: NEGATIVE ng/mL (ref <5.0)
Codeine: NEGATIVE ng/mL (ref <2.5)
Cotinine: 32.9 ng/mL — ABNORMAL HIGH (ref <5.0)
Diazepam: NEGATIVE ng/mL (ref <0.50)
Dihydrocodeine: 5.6 ng/mL — ABNORMAL HIGH (ref <2.5)
Fentanyl: NEGATIVE ng/mL (ref <0.10)
Flunitrazepam: NEGATIVE ng/mL (ref <0.50)
Flurazepam: NEGATIVE ng/mL (ref <0.50)
Heroin Metabolite: NEGATIVE ng/mL (ref <1.0)
Hydrocodone: 115.5 ng/mL — ABNORMAL HIGH (ref <2.5)
Hydromorphone: NEGATIVE ng/mL (ref <2.5)
Lorazepam: NEGATIVE ng/mL (ref <0.50)
MARIJUANA: NEGATIVE ng/mL (ref <2.5)
MDMA: NEGATIVE ng/mL (ref <10)
Meprobamate: NEGATIVE ng/mL (ref <2.5)
Methadone: NEGATIVE ng/mL (ref <5.0)
Methamphetamine: NEGATIVE ng/mL (ref <10)
Midazolam: NEGATIVE ng/mL (ref <0.50)
Morphine: NEGATIVE ng/mL (ref <2.5)
Nicotine Metabolite: POSITIVE ng/mL — AB (ref <5.0)
Nordiazepam: NEGATIVE ng/mL (ref <0.50)
Norhydrocodone: 13.6 ng/mL — ABNORMAL HIGH (ref <2.5)
Noroxycodone: NEGATIVE ng/mL (ref <2.5)
Opiates: POSITIVE ng/mL — AB (ref <2.5)
Oxazepam: NEGATIVE ng/mL (ref <0.50)
Oxycodone: NEGATIVE ng/mL (ref <2.5)
Oxymorphone: NEGATIVE ng/mL (ref <2.5)
Pentobarbital: NEGATIVE ng/mL (ref <10)
Phencyclidine: NEGATIVE ng/mL (ref <10)
Phenobarbital: NEGATIVE ng/mL (ref <10)
Secobarbital: NEGATIVE ng/mL (ref <10)
Tapentadol: NEGATIVE ng/mL (ref <5.0)
Temazepam: NEGATIVE ng/mL (ref <0.50)
Tramadol: NEGATIVE ng/mL (ref <5.0)
Triazolam: NEGATIVE ng/mL (ref <0.50)
Zolpidem: NEGATIVE ng/mL (ref <5.0)

## 2024-03-17 LAB — DRUG TOX ALC METAB W/CON, ORAL FLD: Alcohol Metabolite: NEGATIVE ng/mL (ref <25)

## 2024-03-19 NOTE — Progress Notes (Deleted)
 [name], female    DOB: 01-11-1976    MRN: [id number]   Brief patient profile:  48  yo*** *** referred to pulmonary clinic in [place]  03/20/2024 by *** for ***    Pt not previously seen by PCCM service.    History of Present Illness  03/20/2024  Pulmonary/ 1st office eval/ Lahna Nath /  Office  No chief complaint on file.    Dyspnea:  *** Cough: *** Sleep: *** SABA use: *** 02: *** LDSCT:***  No obvious day to day or daytime pattern/variability or assoc excess/ purulent sputum or mucus plugs or hemoptysis or cp or chest tightness, subjective wheeze or overt sinus or hb symptoms.    Also denies any obvious fluctuation of symptoms with weather or environmental changes or other aggravating or alleviating factors except as outlined above   No unusual exposure hx or h/o childhood pna/ asthma or knowledge of premature birth.  Current Allergies, Complete Past Medical History, Past Surgical History, Family History, and Social History were reviewed in [employer] record.  ROS  The following are not active complaints unless bolded Hoarseness, sore throat, dysphagia, dental problems, itching, sneezing,  nasal congestion or discharge of excess mucus or purulent secretions, ear ache,   fever, chills, sweats, unintended wt loss or wt gain, classically pleuritic or exertional cp,  orthopnea pnd or arm/hand swelling  or leg swelling, presyncope, palpitations, abdominal pain, anorexia, nausea, vomiting, diarrhea  or change in bowel habits or change in bladder habits, change in stools or change in urine, dysuria, hematuria,  rash, arthralgias, visual complaints, headache, numbness, weakness or ataxia or problems with walking or coordination,  change in mood or  memory.            Outpatient Medications Prior to Visit  Medication Sig Dispense Refill   amitriptyline  (ELAVIL ) 50 MG tablet Take 50 mg by mouth at bedtime.     amphetamine-dextroamphetamine  (ADDERALL XR) 25 MG 24 hr capsule Take by mouth daily.     amphetamine-dextroamphetamine (ADDERALL) 20 MG tablet Take 20 mg by mouth 2 (two) times daily. ER (Patient not taking: Reported on 03/14/2024)     apixaban  (ELIQUIS ) 2.5 MG TABS tablet Take 1 tablet (2.5 mg total) by mouth 2 (two) times daily. 180 tablet 2   apixaban  (ELIQUIS ) 5 MG TABS tablet Take 2 tablets (10 mg total) by mouth 2 (two) times daily for 7 days, THEN 1 tablet (5 mg total) 2 (two) times daily. (Patient not taking: No sig reported) 388 tablet 0   butalbital-acetaminophen -caffeine (FIORICET) 50-325-40 MG tablet Take 1 tablet by mouth as needed.     clonazePAM (KLONOPIN) 1 MG tablet Take 1 mg by mouth 3 (three) times daily.     ergocalciferol (VITAMIN D2) 1.25 MG (50000 UT) capsule Take 50,000 Units by mouth once a week.     gabapentin  (NEURONTIN ) 100 MG capsule TAKE 1 CAPSULE BY MOUTH FOUR TIMES DAILY 120 capsule 2   HYDROcodone -acetaminophen  (NORCO) 7.5-325 MG tablet Take 2 tablets by mouth every 8 (eight) hours as needed for moderate pain (pain score 4-6). Do Not Fill Before 03/22/2024 180 tablet 0   KRILL OIL PO Take by mouth.     meloxicam  (MOBIC ) 7.5 MG tablet Take 7.5 mg by mouth 2 (two) times daily as needed.     methocarbamol  (ROBAXIN ) 500 MG tablet Take 500 mg by mouth every 6 (six) hours as needed for muscle spasms.     polyethylene glycol (MIRALAX  / GLYCOLAX )  17 g packet Take 17 g by mouth daily as needed for mild constipation. 30 each 0   rosuvastatin (CRESTOR) 10 MG tablet Take 10 mg by mouth at bedtime.     traZODone  (DESYREL ) 50 MG tablet Take 1 tablet (50 mg total) by mouth at bedtime as needed for sleep.     venlafaxine (EFFEXOR) 25 MG tablet Take 50 mg by mouth daily.     No facility-administered medications prior to visit.    Past Medical History:  Diagnosis Date   Anxiety    Chronic pain    Dr. Carilyn   Degenerative disc disease    Spinal, small syrinx   Depression    Esophageal stricture     Status post dilatation   Fibromyalgia    GERD (gastroesophageal reflux disease)    Hiatal hernia    Internal hemorrhoids without mention of complication    Irritable bowel syndrome    Palpitations    No documented arrhythmias   Personal history of colonic polyps 10/04/2000   HYPERPLASTIC POLYP   POTS (postural orthostatic tachycardia syndrome)    Pulmonary embolus (HCC) 05/04/2023   Syncope    Negative tilt table test   Syringomyelia and syringobulbia (HCC)       Objective:     There were no vitals taken for this visit.         Assessment   Assessment & Plan      There are no Patient Instructions on file for this visit.   Ozell America, MD 03/19/2024

## 2024-03-20 ENCOUNTER — Encounter: Payer: Self-pay | Admitting: Internal Medicine

## 2024-03-20 ENCOUNTER — Ambulatory Visit: Admitting: Internal Medicine

## 2024-04-20 ENCOUNTER — Encounter: Admitting: Registered Nurse

## 2024-04-20 ENCOUNTER — Telehealth: Payer: Self-pay | Admitting: Registered Nurse

## 2024-04-20 NOTE — Telephone Encounter (Signed)
 Pt states she has excruciating headache and, an upset stomach. Also, Pt want know if a phone call is good since she won't be able to come in for her appt. She wants a reply.

## 2024-04-20 NOTE — Telephone Encounter (Signed)
 Pt states she has excruciating headache and upset stomach. Also, Pt want know if a phone call is good since she won't be able to come in for her appt. She want a reply.

## 2024-04-21 ENCOUNTER — Encounter: Attending: Physical Medicine & Rehabilitation | Admitting: Registered Nurse

## 2024-04-21 VITALS — BP 103/70 | HR 87 | Ht 66.0 in | Wt 153.8 lb

## 2024-04-21 DIAGNOSIS — G894 Chronic pain syndrome: Secondary | ICD-10-CM

## 2024-04-21 DIAGNOSIS — G5793 Unspecified mononeuropathy of bilateral lower limbs: Secondary | ICD-10-CM | POA: Diagnosis not present

## 2024-04-21 DIAGNOSIS — M25511 Pain in right shoulder: Secondary | ICD-10-CM

## 2024-04-21 DIAGNOSIS — M792 Neuralgia and neuritis, unspecified: Secondary | ICD-10-CM | POA: Diagnosis not present

## 2024-04-21 DIAGNOSIS — M25512 Pain in left shoulder: Secondary | ICD-10-CM

## 2024-04-21 DIAGNOSIS — G8929 Other chronic pain: Secondary | ICD-10-CM | POA: Diagnosis not present

## 2024-04-21 DIAGNOSIS — M542 Cervicalgia: Secondary | ICD-10-CM

## 2024-04-21 DIAGNOSIS — Z79891 Long term (current) use of opiate analgesic: Secondary | ICD-10-CM | POA: Diagnosis not present

## 2024-04-21 DIAGNOSIS — M545 Low back pain, unspecified: Secondary | ICD-10-CM | POA: Diagnosis not present

## 2024-04-21 DIAGNOSIS — Z5181 Encounter for therapeutic drug level monitoring: Secondary | ICD-10-CM | POA: Diagnosis not present

## 2024-04-21 DIAGNOSIS — M5412 Radiculopathy, cervical region: Secondary | ICD-10-CM

## 2024-04-21 MED ORDER — HYDROCODONE-ACETAMINOPHEN 7.5-325 MG PO TABS: 2.0000 | ORAL_TABLET | Freq: Three times a day (TID) | ORAL | 0 refills | Status: DC | PRN

## 2024-04-21 NOTE — Progress Notes (Signed)
 Subjective:    Patient ID: [name], female    DOB: [birth year], 48 y.o.   MRN: [id number]  HPI: [name] is a 48 y.o. female who sent a message to office she was sick appointment changed to a virtual visit. I connected with [name]  by Video, and verified that I am speaking with the correct person using two identifiers.  Location: Patient: In her Home  Provider: In the Office    I discussed the limitations, risks, security and privacy concerns of performing an evaluation and management service by telephone and the availability of in person appointments. I also discussed with the patient that there may be a patient responsible charge related to this service. The patient expressed understanding and agreed to proceed.  She  states her pain is located in her neck radiating into her bilateral shoulders, lower back pain and bilateral hands and bilateral feet with tingling and burning. She rates her pain 6.Her current exercise regime is walking and performing stretching exercises.  [name] Morphine equivalent is 45.00 MME. She is also prescribed Clonazepam  by Dr. Bertell .We have discussed the black box warning of using opioids and benzodiazepines. I highlighted the dangers of using these drugs together and discussed the adverse events including respiratory suppression, overdose, cognitive impairment and importance of compliance with current regimen. We will continue to monitor and adjust as indicated.   Last Oral Swab was Performed on 03/14/2024, it was consistent.       Pain Inventory Average Pain 6-7 Pain Right Now 6 My pain is intermittent, constant, sharp, burning, dull, stabbing, tingling, and aching  In the last 24 hours, has pain interfered with the following? General activity 10 Relation with others 10 Enjoyment of life 10 What TIME of day is your pain at its worst? morning , evening, and night Sleep (in general) Fair  Pain is worse with: walking, bending,  sitting, standing, and some activites Pain improves with: rest, heat/ice, pacing activities, and medication, stretching Relief from Meds: 5  Family History  Problem Relation Age of Onset   Diabetes type II Mother    Colon polyps Mother    Diverticulitis Mother    Diabetes Mother    Heart disease Mother    Osteoporosis Father    Diabetes type II Brother    Uterine cancer Other    Ovarian cancer Other    COPD Paternal Grandfather    Heart attack Maternal Grandmother    Social History   Socioeconomic History   Marital status: Married    Spouse name: Not on file   Number of children: 2   Years of education: Not on file   Highest education level: Not on file  Occupational History   Occupation: Disability    Employer: UNEMPLOYED  Tobacco Use   Smoking status: Former    Current packs/day: 0.00    Types: Cigarettes    Quit date: 06/30/2011    Years since quitting: 12.8   Smokeless tobacco: Never  Vaping Use   Vaping status: Some Days   Substances: Nicotine, Flavoring  Substance and Sexual Activity   Alcohol  use: No    Alcohol /week: 0.0 standard drinks of alcohol    Drug use: No   Sexual activity: Yes  Other Topics Concern   Not on file  Social History Narrative   Not on file   Social Drivers of Health   Financial Resource Strain: High Risk (06/10/2023)   Overall Financial Resource Strain (CARDIA)  Difficulty of Paying Living Expenses: Very hard  Food Insecurity: Food Insecurity Present (06/10/2023)   Hunger Vital Sign    Worried About Running Out of Food in the Last Year: Sometimes true    Ran Out of Food in the Last Year: Never true  Transportation Needs: No Transportation Needs (06/10/2023)   PRAPARE - Administrator, Civil Service (Medical): No    Lack of Transportation (Non-Medical): No  Physical Activity: Inactive (06/10/2023)   Exercise Vital Sign    Days of Exercise per Week: 7 days    Minutes of Exercise per Session: 0 min  Stress: Stress  Concern Present (06/10/2023)   [employer] of Occupational Health - Occupational Stress Questionnaire    Feeling of Stress : Rather much  Social Connections: Moderately Isolated (06/10/2023)   Social Connection and Isolation Panel    Frequency of Communication with Friends and Family: Three times a week    Frequency of Social Gatherings with Friends and Family: Once a week    Attends Religious Services: Never    Database administrator or Organizations: No    Attends Banker Meetings: Never    Marital Status: Married   Past Surgical History:  Procedure Laterality Date   BALLOON DILATION N/A 08/30/2023   Procedure: BALLOON DILATION;  Surgeon: Cindie Carlin POUR, DO;  [facility];  Service: Endoscopy;  Laterality: N/A;   BIOPSY  08/30/2023   Procedure: BIOPSY;  Surgeon: Cindie Carlin POUR, DO;  [facility]: [facility];  Service: Endoscopy;;   BREAST BIOPSY Right 12/31/2016   FIBROCYSTIC CHANGES WITH CALCIFICATIONS/PSEUDOANGIOMATOUS STROMAL HYPERPLASIA (PASH)   CESAREAN SECTION     COLONOSCOPY WITH PROPOFOL  N/A 08/30/2023   Procedure: COLONOSCOPY WITH PROPOFOL ;  Surgeon: Cindie Carlin POUR, DO;  [facility];  Service: Endoscopy;  Laterality: N/A;  800am, asa 3   ESOPHAGOGASTRODUODENOSCOPY (EGD) WITH PROPOFOL  N/A 08/30/2023   Procedure: ESOPHAGOGASTRODUODENOSCOPY (EGD) WITH PROPOFOL ;  Surgeon: Cindie Carlin POUR, DO;  [facility];  Service: Endoscopy;  Laterality: N/A;   Left elbow surgery     PARTIAL HYSTERECTOMY     POLYPECTOMY  08/30/2023   Procedure: POLYPECTOMY;  Surgeon: Cindie Carlin POUR, DO;  [facility];  Service: Endoscopy;;   TONSILLECTOMY     Past Surgical History:  Procedure Laterality Date   BALLOON DILATION N/A 08/30/2023   Procedure: BALLOON DILATION;  Surgeon: Cindie Carlin POUR, DO;  [facility];  Service: Endoscopy;  Laterality: N/A;   BIOPSY  08/30/2023   Procedure: BIOPSY;  Surgeon: Cindie Carlin POUR,  DO;  [facility];  Service: Endoscopy;;   BREAST BIOPSY Right 12/31/2016   FIBROCYSTIC CHANGES WITH CALCIFICATIONS/PSEUDOANGIOMATOUS STROMAL HYPERPLASIA (PASH)   CESAREAN SECTION     COLONOSCOPY WITH PROPOFOL  N/A 08/30/2023   Procedure: COLONOSCOPY WITH PROPOFOL ;  Surgeon: Cindie Carlin POUR, DO;  [facility];  Service: Endoscopy;  Laterality: N/A;  800am, asa 3   ESOPHAGOGASTRODUODENOSCOPY (EGD) WITH PROPOFOL  N/A 08/30/2023   Procedure: ESOPHAGOGASTRODUODENOSCOPY (EGD) WITH PROPOFOL ;  Surgeon: Cindie Carlin POUR, DO;  [facility];  Service: Endoscopy;  Laterality: N/A;   Left elbow surgery     PARTIAL HYSTERECTOMY     POLYPECTOMY  08/30/2023   Procedure: POLYPECTOMY;  Surgeon: Cindie Carlin POUR, DO;  [facility];  Service: Endoscopy;;   TONSILLECTOMY     Past Medical History:  Diagnosis Date   Anxiety    Chronic pain  Dr. Carilyn   Degenerative disc disease    Spinal, small syrinx   Depression    Esophageal stricture    Status post dilatation   Fibromyalgia    GERD (gastroesophageal reflux disease)    Hiatal hernia    Internal hemorrhoids without mention of complication    Irritable bowel syndrome    Palpitations    No documented arrhythmias   Personal history of colonic polyps 10/04/2000   HYPERPLASTIC POLYP   POTS (postural orthostatic tachycardia syndrome)    Pulmonary embolus (HCC) 05/04/2023   Syncope    Negative tilt table test   Syringomyelia and syringobulbia (HCC)    There were no vitals taken for this visit.  Opioid Risk Score:   Fall Risk Score:  `1  Depression screen [facility] 2/9     03/14/2024    3:03 PM 01/25/2024    3:19 PM 12/30/2023    3:29 PM 11/30/2023    2:11 PM 10/15/2023    2:18 PM 09/03/2023    2:47 PM 06/10/2023    3:30 PM  Depression screen PHQ 2/9  Decreased Interest 0 0 1 1 1  0 3  Down, Depressed, Hopeless 0 0 1 1 1  0 3  PHQ - 2 Score 0 0 2 2 2  0 6  Altered sleeping  0 2    1  Tired, decreased  energy  0 3    3  Change in appetite  0 0    0  Feeling bad or failure about yourself   0 3    3  Trouble concentrating  0 1    1  Moving slowly or fidgety/restless  0 0    1  Suicidal thoughts  0 0    0  PHQ-9 Score  0 11    15  Difficult doing work/chores  Somewhat difficult Not difficult at all    Very difficult      Review of Systems  Musculoskeletal:  Positive for arthralgias, back pain and neck pain.       Headaches, neck pain, low back pain, right elbow pain, bilateral hand pain, right knee pain, bilateral foot pain.  Neurological:  Positive for headaches.  All other systems reviewed and are negative.      Objective:   Physical Exam Vitals and nursing note reviewed.  Musculoskeletal:     Comments: No Physical Exam Performed : Virtual Visit           Assessment & Plan:  1. Lumbar degenerative disc disease: 04/21/2024. Continue with current treatment regimen: Refilled : HYDROcodone  7.5/325 mg two tablets every 8 hours as needed #180. Second script sent for the following month to accommodate scheduled appointment. We will continue the opioid monitoring program, this consists of regular clinic visits, examinations, urine drug screen, pill counts as well as use of [place]  Controlled Substance Reporting system. A 12 month History has been reviewed on the   Controlled Substance Reporting System on 04/21/2024 2. Thoracic Spondylosis: Continue current treatment with HEP and  current medication regime. 04/21/2024. 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 Continue to Monitor. 04/21/2024.  Lyrica  discontinued due to adverse effects. Continue with exercise and heat therapy. 04/21/2024 4. Fibromyalgia:Continue with Heat and  exercise Regimen. Continue Gabapentin . 04/21/2024  5.Depression and anxiety: PCP Following. 04/21/2024 6. Orthostatic hypotension: Cardiology Following. 04/21/2024 7.  Polyarthralgia: Continue HEP as tolerated . Continue to monitor. 04/21/2024 8. Chronic Bilateral  Shoulder Pain: Continue current medication regime. Continue HEP  as Tolerated. 09/19//2025. 9. Muscle Spasm:No complaints today.  Continue to monitor.  04/21/2024. 10. Neuropathic Pain: Continue Gabapentin . Continue to Monitor.  04/21/2024  11. Right elbow Pain: No complaints today. Continue current medication regimen. Continue to monitor. 04/21/2024 12. Bilateral  Knee with OA:.No complaints today. Continue HEP as Tolerated. Continue current medication regimen. Continue to monitor. 04/21/2024  13. Right Hip Pain: No complaints today. Continue with HEP as Tolerated. Continue to Monitor. 09/ 19/ 2025   F/U in 1 Month   Virtual Visit Established Patient Used 100 % Video and 100% Audio for Visit  Location of Patient: In Her Home Location of Provider: In the Office

## 2024-05-02 ENCOUNTER — Encounter: Payer: Self-pay | Admitting: Registered Nurse

## 2024-05-09 ENCOUNTER — Other Ambulatory Visit: Payer: Self-pay | Admitting: Registered Nurse

## 2024-05-18 ENCOUNTER — Encounter: Payer: Self-pay | Admitting: Registered Nurse

## 2024-05-18 ENCOUNTER — Encounter: Attending: Physical Medicine & Rehabilitation | Admitting: Registered Nurse

## 2024-05-18 VITALS — BP 103/71 | HR 98 | Ht 66.0 in | Wt 156.2 lb

## 2024-05-18 DIAGNOSIS — M25512 Pain in left shoulder: Secondary | ICD-10-CM | POA: Diagnosis present

## 2024-05-18 DIAGNOSIS — M542 Cervicalgia: Secondary | ICD-10-CM | POA: Diagnosis not present

## 2024-05-18 DIAGNOSIS — M792 Neuralgia and neuritis, unspecified: Secondary | ICD-10-CM | POA: Diagnosis present

## 2024-05-18 DIAGNOSIS — M5412 Radiculopathy, cervical region: Secondary | ICD-10-CM | POA: Insufficient documentation

## 2024-05-18 DIAGNOSIS — M546 Pain in thoracic spine: Secondary | ICD-10-CM | POA: Diagnosis present

## 2024-05-18 DIAGNOSIS — M545 Low back pain, unspecified: Secondary | ICD-10-CM | POA: Insufficient documentation

## 2024-05-18 DIAGNOSIS — Z5181 Encounter for therapeutic drug level monitoring: Secondary | ICD-10-CM | POA: Diagnosis present

## 2024-05-18 DIAGNOSIS — M25561 Pain in right knee: Secondary | ICD-10-CM | POA: Insufficient documentation

## 2024-05-18 DIAGNOSIS — M25511 Pain in right shoulder: Secondary | ICD-10-CM | POA: Insufficient documentation

## 2024-05-18 DIAGNOSIS — Z79891 Long term (current) use of opiate analgesic: Secondary | ICD-10-CM | POA: Insufficient documentation

## 2024-05-18 DIAGNOSIS — G894 Chronic pain syndrome: Secondary | ICD-10-CM | POA: Diagnosis present

## 2024-05-18 DIAGNOSIS — G8929 Other chronic pain: Secondary | ICD-10-CM | POA: Insufficient documentation

## 2024-05-18 DIAGNOSIS — G5793 Unspecified mononeuropathy of bilateral lower limbs: Secondary | ICD-10-CM | POA: Insufficient documentation

## 2024-05-18 MED ORDER — HYDROCODONE-ACETAMINOPHEN 7.5-325 MG PO TABS: 2.0000 | ORAL_TABLET | Freq: Three times a day (TID) | ORAL | 0 refills | Status: DC | PRN

## 2024-05-18 MED ORDER — GABAPENTIN 100 MG PO CAPS: 100.0000 mg | ORAL_CAPSULE | Freq: Four times a day (QID) | ORAL | 2 refills | Status: AC

## 2024-05-18 NOTE — Progress Notes (Signed)
 Subjective:    Patient ID: [name], female    DOB: [birth year], 48 y.o.   MRN: [id number]  HPI: [name] is a 48 y.o. female who returns for follow up appointment for chronic pain and medication refill.[name]  states her pain is located in her neck radiating into her bilateral shoulders, mid- lower back pain, right knee pain and bilateral feet with tingling and burning. [name] also reports right hand tingling.[name] rates her  pain 6-7. Her current exercise regime is walking and performing stretching exercises.  [name] Morphine equivalent is 45.00 MME. [name]  is also prescribed Clonazepam by Dr. Bertell .We have discussed the black box warning of using opioids and benzodiazepines. I highlighted the dangers of using these drugs together and discussed the adverse events including respiratory suppression, overdose, cognitive impairment and importance of compliance with current regimen. We will continue to monitor and adjust as indicated.    Last Oral Swab was Performed on 03/14/2024, it was consistent.    Pain Inventory Average Pain 6-7 Pain Right Now 6-7 My pain is intermittent, sharp, burning, dull, stabbing, tingling, and aching  In the last 24 hours, has pain interfered with the following? General activity 7-8 Relation with others 78 Enjoyment of life 7-8 What TIME of day is your pain at its worst? morning , evening, and night Sleep (in general) Fair  Pain is worse with: walking, bending, sitting, inactivity, standing, and some activites Pain improves with: rest, heat/ice, therapy/exercise, and medication Relief from Meds: 5  Family History  Problem Relation Age of Onset   Diabetes type II Mother    Colon polyps Mother    Diverticulitis Mother    Diabetes Mother    Heart disease Mother    Osteoporosis Father    Diabetes type II Brother    Uterine cancer Other    Ovarian cancer Other    COPD Paternal Grandfather    Heart attack Maternal Grandmother    Social History    Socioeconomic History   Marital status: Married    Spouse name: Not on file   Number of children: 2   Years of education: Not on file   Highest education level: Not on file  Occupational History   Occupation: Disability    Employer: UNEMPLOYED  Tobacco Use   Smoking status: Former    Current packs/day: 0.00    Types: Cigarettes    Quit date: 06/30/2011    Years since quitting: 12.8   Smokeless tobacco: Never  Vaping Use   Vaping status: Some Days   Substances: Nicotine, Flavoring  Substance and Sexual Activity   Alcohol  use: No    Alcohol /week: 0.0 standard drinks of alcohol    Drug use: No   Sexual activity: Yes  Other Topics Concern   Not on file  Social History Narrative   Not on file   Social Drivers of Health   Financial Resource Strain: High Risk (06/10/2023)   Overall Financial Resource Strain (CARDIA)    Difficulty of Paying Living Expenses: Very hard  Food Insecurity: Food Insecurity Present (06/10/2023)   Hunger Vital Sign    Worried About Running Out of Food in the Last Year: Sometimes true    Ran Out of Food in the Last Year: Never true  Transportation Needs: No Transportation Needs (06/10/2023)   PRAPARE - Administrator, Civil Service (Medical): No    Lack of Transportation (Non-Medical): No  Physical Activity: Inactive (06/10/2023)   Exercise Vital Sign  Days of Exercise per Week: 7 days    Minutes of Exercise per Session: 0 min  Stress: Stress Concern Present (06/10/2023)   [employer] of Occupational Health - Occupational Stress Questionnaire    Feeling of Stress : Rather much  Social Connections: Moderately Isolated (06/10/2023)   Social Connection and Isolation Panel    Frequency of Communication with Friends and Family: Three times a week    Frequency of Social Gatherings with Friends and Family: Once a week    Attends Religious Services: Never    Database administrator or Organizations: No    Attends Banker  Meetings: Never    Marital Status: Married   Past Surgical History:  Procedure Laterality Date   BALLOON DILATION N/A 08/30/2023   Procedure: BALLOON DILATION;  Surgeon: Cindie Carlin POUR, DO;  [facility];  Service: Endoscopy;  Laterality: N/A;   BIOPSY  08/30/2023   Procedure: BIOPSY;  Surgeon: Cindie Carlin POUR, DO;  [facility];  Service: Endoscopy;;   BREAST BIOPSY Right 12/31/2016   FIBROCYSTIC CHANGES WITH CALCIFICATIONS/PSEUDOANGIOMATOUS STROMAL HYPERPLASIA (PASH)   CESAREAN SECTION     COLONOSCOPY WITH PROPOFOL  N/A 08/30/2023   Procedure: COLONOSCOPY WITH PROPOFOL ;  Surgeon: Cindie Carlin POUR, DO;  [facility];  Service: Endoscopy;  Laterality: N/A;  800am, asa 3   ESOPHAGOGASTRODUODENOSCOPY (EGD) WITH PROPOFOL  N/A 08/30/2023   Procedure: ESOPHAGOGASTRODUODENOSCOPY (EGD) WITH PROPOFOL ;  Surgeon: Cindie Carlin POUR, DO;  [facility];  Service: Endoscopy;  Laterality: N/A;   Left elbow surgery     PARTIAL HYSTERECTOMY     POLYPECTOMY  08/30/2023   Procedure: POLYPECTOMY;  Surgeon: Cindie Carlin POUR, DO;  [facility];  Service: Endoscopy;;   TONSILLECTOMY     Past Surgical History:  Procedure Laterality Date   BALLOON DILATION N/A 08/30/2023   Procedure: BALLOON DILATION;  Surgeon: Cindie Carlin POUR, DO;  [facility];  Service: Endoscopy;  Laterality: N/A;   BIOPSY  08/30/2023   Procedure: BIOPSY;  Surgeon: Cindie Carlin POUR, DO;  [facility];  Service: Endoscopy;;   BREAST BIOPSY Right 12/31/2016   FIBROCYSTIC CHANGES WITH CALCIFICATIONS/PSEUDOANGIOMATOUS STROMAL HYPERPLASIA (PASH)   CESAREAN SECTION     COLONOSCOPY WITH PROPOFOL  N/A 08/30/2023   Procedure: COLONOSCOPY WITH PROPOFOL ;  Surgeon: Cindie Carlin POUR, DO;  [facility];  Service: Endoscopy;  Laterality: N/A;  800am, asa 3   ESOPHAGOGASTRODUODENOSCOPY (EGD) WITH PROPOFOL  N/A 08/30/2023   Procedure: ESOPHAGOGASTRODUODENOSCOPY (EGD)  WITH PROPOFOL ;  Surgeon: Cindie Carlin POUR, DO;  [facility];  Service: Endoscopy;  Laterality: N/A;   Left elbow surgery     PARTIAL HYSTERECTOMY     POLYPECTOMY  08/30/2023   Procedure: POLYPECTOMY;  Surgeon: Cindie Carlin POUR, DO;  [facility];  Service: Endoscopy;;   TONSILLECTOMY     Past Medical History:  Diagnosis Date   Anxiety    Chronic pain    Dr. Carilyn   Degenerative disc disease    Spinal, small syrinx   Depression    Esophageal stricture    Status post dilatation   Fibromyalgia    GERD (gastroesophageal reflux disease)    Hiatal hernia    Internal hemorrhoids without mention of complication    Irritable bowel syndrome    Palpitations    No documented arrhythmias   Personal history of colonic polyps 10/04/2000   HYPERPLASTIC POLYP   POTS (postural orthostatic tachycardia syndrome)  Pulmonary embolus (HCC) 05/04/2023   Syncope    Negative tilt table test   Syringomyelia and syringobulbia (HCC)    BP 103/71 (BP Location: Left Arm, Patient Position: Sitting, Cuff Size: Normal)   Pulse (!) 106   Ht 5' 6 (1.676 m)   Wt 156 lb 3.2 oz (70.9 kg)   SpO2 97%   BMI 25.21 kg/m   Opioid Risk Score:   Fall Risk Score:  `1  Depression screen [facility] 2/9     03/14/2024    3:03 PM 01/25/2024    3:19 PM 12/30/2023    3:29 PM 11/30/2023    2:11 PM 10/15/2023    2:18 PM 09/03/2023    2:47 PM 06/10/2023    3:30 PM  Depression screen PHQ 2/9  Decreased Interest 0 0 1 1 1  0 3  Down, Depressed, Hopeless 0 0 1 1 1  0 3  PHQ - 2 Score 0 0 2 2 2  0 6  Altered sleeping  0 2    1  Tired, decreased energy  0 3    3  Change in appetite  0 0    0  Feeling bad or failure about yourself   0 3    3  Trouble concentrating  0 1    1  Moving slowly or fidgety/restless  0 0    1  Suicidal thoughts  0 0    0  PHQ-9 Score  0 11    15  Difficult doing work/chores  Somewhat difficult Not difficult at all    Very difficult      Review of Systems   Musculoskeletal:  Positive for back pain, myalgias and neck pain.       Pain in the back of head radiating down into neck, bilateral foot, hand and shoulder pain, right knee pain   Neurological:  Positive for headaches.  All other systems reviewed and are negative.      Objective:   Physical Exam Vitals and nursing note reviewed.  Constitutional:      Appearance: Normal appearance.  Neck:     Comments: Cervical Paraspinal Tenderness: C-5-C-6 Cardiovascular:     Rate and Rhythm: Normal rate and regular rhythm.     Pulses: Normal pulses.     Heart sounds: Normal heart sounds.  Pulmonary:     Effort: Pulmonary effort is normal.     Breath sounds: Normal breath sounds.  Musculoskeletal:     Comments: Normal Muscle Bulk and Muscle Testing Reveals:  Upper Extremities: Full ROM and Muscle Strength 5/5 Bilateral AC Joint Tenderness  Thoracic Paraspinal Tenderness: T-4-T-6 Lower Extremities: Full ROM and Muscle Strength 5/5 Arises from Table slowly Narrow Based  Gait     Skin:    General: Skin is warm and dry.  Neurological:     Mental Status: [name] is alert and oriented to person, place, and time.  Psychiatric:        Mood and Affect: Mood normal.        Behavior: Behavior normal.           Assessment & Plan:  1. Lumbar degenerative disc disease: 05/18/2024. Continue with current treatment regimen: Refilled : HYDROcodone  7.5/325 mg two tablets every 8 hours as needed #180. Second script sent for the following month to accommodate scheduled appointment. We will continue the opioid monitoring program, this consists of regular clinic visits, examinations, urine drug screen, pill counts as well as use of [place]  Controlled Substance Reporting system. A  12 month History has been reviewed on the [place]  Controlled Substance Reporting System on 05/18/2024 2. Thoracic Spondylosis: Continue current treatment with HEP and  current medication regime. 05/18/2024. 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 Continue to Monitor. 05/18/2024.  Lyrica  discontinued due to adverse effects. Continue with exercise and heat therapy. 05/18/2024 4. Fibromyalgia:Continue with Heat and  exercise Regimen. Continue Gabapentin . 05/18/2024  5.Depression and anxiety: PCP Following. 05/18/2024 6. Orthostatic hypotension: Cardiology Following. 05/18/2024 7. Polyarthralgia: Continue HEP as tolerated . Continue to monitor. 05/18/2024 8. Chronic Bilateral  Shoulder Pain: Continue current medication regime. Continue HEP as Tolerated. 10/16//2025. 9. Muscle Spasm:No complaints today.  Continue to monitor.  05/18/2024. 10. Neuropathic Pain: Continue Gabapentin . Continue to Monitor.  05/18/2024  11. Right elbow Pain: No complaints today. Continue current medication regimen. Continue to monitor. 05/18/2024 12. Right Knee with OA:. Continue HEP as Tolerated. Continue current medication regimen. Continue to monitor. 05/18/2024  13. Right Hip Pain: No complaints today. Continue with HEP as Tolerated. Continue to Monitor. 10/ 16/ 2025   F/U in 1 Month

## 2024-06-16 ENCOUNTER — Encounter: Payer: Self-pay | Admitting: Registered Nurse

## 2024-06-16 ENCOUNTER — Encounter: Attending: Physical Medicine & Rehabilitation | Admitting: Registered Nurse

## 2024-06-16 ENCOUNTER — Encounter: Admitting: Registered Nurse

## 2024-06-16 VITALS — BP 122/84 | HR 92 | Ht 66.0 in | Wt 158.0 lb

## 2024-06-16 DIAGNOSIS — M542 Cervicalgia: Secondary | ICD-10-CM | POA: Insufficient documentation

## 2024-06-16 DIAGNOSIS — G5793 Unspecified mononeuropathy of bilateral lower limbs: Secondary | ICD-10-CM | POA: Diagnosis present

## 2024-06-16 DIAGNOSIS — Z5181 Encounter for therapeutic drug level monitoring: Secondary | ICD-10-CM | POA: Insufficient documentation

## 2024-06-16 DIAGNOSIS — M5412 Radiculopathy, cervical region: Secondary | ICD-10-CM | POA: Diagnosis present

## 2024-06-16 DIAGNOSIS — M546 Pain in thoracic spine: Secondary | ICD-10-CM | POA: Insufficient documentation

## 2024-06-16 DIAGNOSIS — M545 Low back pain, unspecified: Secondary | ICD-10-CM | POA: Diagnosis not present

## 2024-06-16 DIAGNOSIS — M25512 Pain in left shoulder: Secondary | ICD-10-CM | POA: Insufficient documentation

## 2024-06-16 DIAGNOSIS — G894 Chronic pain syndrome: Secondary | ICD-10-CM | POA: Insufficient documentation

## 2024-06-16 DIAGNOSIS — Z79891 Long term (current) use of opiate analgesic: Secondary | ICD-10-CM | POA: Diagnosis present

## 2024-06-16 DIAGNOSIS — M792 Neuralgia and neuritis, unspecified: Secondary | ICD-10-CM | POA: Diagnosis present

## 2024-06-16 DIAGNOSIS — M25561 Pain in right knee: Secondary | ICD-10-CM | POA: Insufficient documentation

## 2024-06-16 DIAGNOSIS — G8929 Other chronic pain: Secondary | ICD-10-CM | POA: Insufficient documentation

## 2024-06-16 DIAGNOSIS — M25511 Pain in right shoulder: Secondary | ICD-10-CM | POA: Insufficient documentation

## 2024-06-16 MED ORDER — HYDROCODONE-ACETAMINOPHEN 7.5-325 MG PO TABS: 2.0000 | ORAL_TABLET | Freq: Three times a day (TID) | ORAL | 0 refills | Status: DC | PRN

## 2024-06-16 NOTE — Progress Notes (Signed)
 Subjective:    Patient ID: [name], female    DOB: [birth year], 48 y.o.   MRN: [id number]  HPI: [name] is a 48 y.o. female who returns for follow up appointment for chronic pain and medication refill. She states her pain is located in her neck radiating into her bilateral shoulders, mid- lower back pain and right knee pain. She also reports neuropathic pain in hands, feet. She rates her pain 7. Her current exercise regime is walking and performing stretching exercises.  [name] Morphine equivalent is 45.00 MME. She is also prescribed Clonazepam by Dr. Bertell .We have discussed the black box warning of using opioids and benzodiazepines. I highlighted the dangers of using these drugs together and discussed the adverse events including respiratory suppression, overdose, cognitive impairment and importance of compliance with current regimen. We will continue to monitor and adjust as indicated.    Last oral swab was performed on 03/14/2024, it was consistent.      Pain Inventory Average Pain 7 Pain Right Now 7 My pain is intermittent, sharp, burning, dull, stabbing, tingling, and aching  In the last 24 hours, has pain interfered with the following? General activity 7 Relation with others 7 Enjoyment of life 7 What TIME of day is your pain at its worst? morning , evening, and night Sleep (in general) poor to fair  Pain is worse with: walking, bending, sitting, inactivity, standing, and some activites Pain improves with: rest, heat/ice, and medication Relief from Meds: 6  Family History  Problem Relation Age of Onset   Diabetes type II Mother    Colon polyps Mother    Diverticulitis Mother    Diabetes Mother    Heart disease Mother    Osteoporosis Father    Diabetes type II Brother    Uterine cancer Other    Ovarian cancer Other    COPD Paternal Grandfather    Heart attack Maternal Grandmother    Social History   Socioeconomic History   Marital status:  Married    Spouse name: Not on file   Number of children: 2   Years of education: Not on file   Highest education level: Not on file  Occupational History   Occupation: Disability    Employer: UNEMPLOYED  Tobacco Use   Smoking status: Former    Current packs/day: 0.00    Types: Cigarettes    Quit date: 06/30/2011    Years since quitting: 12.9   Smokeless tobacco: Never  Vaping Use   Vaping status: Some Days   Substances: Nicotine, Flavoring  Substance and Sexual Activity   Alcohol  use: No    Alcohol /week: 0.0 standard drinks of alcohol    Drug use: No   Sexual activity: Yes  Other Topics Concern   Not on file  Social History Narrative   Not on file   Social Drivers of Health   Financial Resource Strain: High Risk (06/10/2023)   Overall Financial Resource Strain (CARDIA)    Difficulty of Paying Living Expenses: Very hard  Food Insecurity: Food Insecurity Present (06/10/2023)   Hunger Vital Sign    Worried About Running Out of Food in the Last Year: Sometimes true    Ran Out of Food in the Last Year: Never true  Transportation Needs: No Transportation Needs (06/10/2023)   PRAPARE - Administrator, Civil Service (Medical): No    Lack of Transportation (Non-Medical): No  Physical Activity: Inactive (06/10/2023)   Exercise Vital Sign  Days of Exercise per Week: 7 days    Minutes of Exercise per Session: 0 min  Stress: Stress Concern Present (06/10/2023)   [employer] of Occupational Health - Occupational Stress Questionnaire    Feeling of Stress : Rather much  Social Connections: Moderately Isolated (06/10/2023)   Social Connection and Isolation Panel    Frequency of Communication with Friends and Family: Three times a week    Frequency of Social Gatherings with Friends and Family: Once a week    Attends Religious Services: Never    Database Administrator or Organizations: No    Attends Banker Meetings: Never    Marital Status: Married    Past Surgical History:  Procedure Laterality Date   BALLOON DILATION N/A 08/30/2023   Procedure: BALLOON DILATION;  Surgeon: Cindie Carlin POUR, DO;  [facility];  Service: Endoscopy;  Laterality: N/A;   BIOPSY  08/30/2023   Procedure: BIOPSY;  Surgeon: Cindie Carlin POUR, DO;  [facility];  Service: Endoscopy;;   BREAST BIOPSY Right 12/31/2016   FIBROCYSTIC CHANGES WITH CALCIFICATIONS/PSEUDOANGIOMATOUS STROMAL HYPERPLASIA (PASH)   CESAREAN SECTION     COLONOSCOPY WITH PROPOFOL  N/A 08/30/2023   Procedure: COLONOSCOPY WITH PROPOFOL ;  Surgeon: Cindie Carlin POUR, DO;  [facility];  Service: Endoscopy;  Laterality: N/A;  800am, asa 3   ESOPHAGOGASTRODUODENOSCOPY (EGD) WITH PROPOFOL  N/A 08/30/2023   Procedure: ESOPHAGOGASTRODUODENOSCOPY (EGD) WITH PROPOFOL ;  Surgeon: Cindie Carlin POUR, DO;  [facility];  Service: Endoscopy;  Laterality: N/A;   Left elbow surgery     PARTIAL HYSTERECTOMY     POLYPECTOMY  08/30/2023   Procedure: POLYPECTOMY;  Surgeon: Cindie Carlin POUR, DO;  [facility];  Service: Endoscopy;;   TONSILLECTOMY     Past Surgical History:  Procedure Laterality Date   BALLOON DILATION N/A 08/30/2023   Procedure: BALLOON DILATION;  Surgeon: Cindie Carlin POUR, DO;  [facility];  Service: Endoscopy;  Laterality: N/A;   BIOPSY  08/30/2023   Procedure: BIOPSY;  Surgeon: Cindie Carlin POUR, DO;  [facility];  Service: Endoscopy;;   BREAST BIOPSY Right 12/31/2016   FIBROCYSTIC CHANGES WITH CALCIFICATIONS/PSEUDOANGIOMATOUS STROMAL HYPERPLASIA (PASH)   CESAREAN SECTION     COLONOSCOPY WITH PROPOFOL  N/A 08/30/2023   Procedure: COLONOSCOPY WITH PROPOFOL ;  Surgeon: Cindie Carlin POUR, DO;  [facility];  Service: Endoscopy;  Laterality: N/A;  800am, asa 3   ESOPHAGOGASTRODUODENOSCOPY (EGD) WITH PROPOFOL  N/A 08/30/2023   Procedure: ESOPHAGOGASTRODUODENOSCOPY (EGD) WITH PROPOFOL ;  Surgeon: Cindie Carlin POUR,  DO;  [facility];  Service: Endoscopy;  Laterality: N/A;   Left elbow surgery     PARTIAL HYSTERECTOMY     POLYPECTOMY  08/30/2023   Procedure: POLYPECTOMY;  Surgeon: Cindie Carlin POUR, DO;  [facility];  Service: Endoscopy;;   TONSILLECTOMY     Past Medical History:  Diagnosis Date   Anxiety    Chronic pain    Dr. Carilyn   Degenerative disc disease    Spinal, small syrinx   Depression    Esophageal stricture    Status post dilatation   Fibromyalgia    GERD (gastroesophageal reflux disease)    Hiatal hernia    Internal hemorrhoids without mention of complication    Irritable bowel syndrome    Palpitations    No documented arrhythmias   Personal history of colonic polyps 10/04/2000   HYPERPLASTIC POLYP   POTS (postural orthostatic tachycardia syndrome)  Pulmonary embolus (HCC) 05/04/2023   Syncope    Negative tilt table test   Syringomyelia and syringobulbia (HCC)    There were no vitals taken for this visit.  Opioid Risk Score:   Fall Risk Score:  `1  Depression screen [facility] 2/9     03/14/2024    3:03 PM 01/25/2024    3:19 PM 12/30/2023    3:29 PM 11/30/2023    2:11 PM 10/15/2023    2:18 PM 09/03/2023    2:47 PM 06/10/2023    3:30 PM  Depression screen PHQ 2/9  Decreased Interest 0 0 1 1 1  0 3  Down, Depressed, Hopeless 0 0 1 1 1  0 3  PHQ - 2 Score 0 0 2 2 2  0 6  Altered sleeping  0 2    1  Tired, decreased energy  0 3    3  Change in appetite  0 0    0  Feeling bad or failure about yourself   0 3    3  Trouble concentrating  0 1    1  Moving slowly or fidgety/restless  0 0    1  Suicidal thoughts  0 0    0  PHQ-9 Score  0  11     15   Difficult doing work/chores  Somewhat difficult Not difficult at all    Very difficult     Data saved with a previous flowsheet row definition    Review of Systems  Musculoskeletal:  Positive for back pain and neck pain.       Pain in both feet, pain in both hands, pain from shoulder to shoulder &  down right arm, right knee  Neurological:  Positive for headaches.       Objective:   Physical Exam Vitals and nursing note reviewed.  Constitutional:      Appearance: Normal appearance.  Neck:     Comments: Cervical Paraspinal Tenderness: C-5-C-6 Cardiovascular:     Rate and Rhythm: Normal rate and regular rhythm.     Pulses: Normal pulses.     Heart sounds: Normal heart sounds.  Pulmonary:     Effort: Pulmonary effort is normal.     Breath sounds: Normal breath sounds.  Musculoskeletal:     Comments: Normal Muscle Bulk and Muscle Testing Reveals:  Upper Extremities: Full ROM and Muscle Strength 5/5 Bilateral AC Joint Tenderness Lumbar Paraspinal Tenderness: L-3-L-5 Right Greater Trochanter Tenderness Lower Extremities: Full ROM and Muscle Strength 5/5 Arises from Table with ease Narrow Based  Gait    Skin:    General: Skin is warm and dry.  Neurological:     Mental Status: She is alert and oriented to person, place, and time.  Psychiatric:        Mood and Affect: Mood normal.        Behavior: Behavior normal.          Assessment & Plan:  1. Lumbar degenerative disc disease: 06/16/2024. Continue with current treatment regimen: Refilled : HYDROcodone  7.5/325 mg two tablets every 8 hours as needed #180. Second script sent for the following month to accommodate scheduled appointment. We will continue the opioid monitoring program, this consists of regular clinic visits, examinations, urine drug screen, pill counts as well as use of [place]  Controlled Substance Reporting system. A 12 month History has been reviewed on the [place]  Controlled Substance Reporting System on 06/16/2024 2. Thoracic Spondylosis: Continue current treatment with HEP and  current medication regime.  06/16/2024. 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 Continue to Monitor. 06/16/2024.  Lyrica  discontinued due to  adverse effects. Continue with exercise and heat therapy. 06/16/2024 4. Fibromyalgia:Continue with Heat and  exercise Regimen. Continue Gabapentin . 06/16/2024  5.Depression and anxiety: PCP Following. 06/16/2024 6. Orthostatic hypotension: Cardiology Following. 06/16/2024 7. Polyarthralgia: Continue HEP as tolerated . Continue to monitor. 06/16/2024 8. Chronic Bilateral  Shoulder Pain: Continue current medication regime. Continue HEP as Tolerated. 11/14//2025. 9. Muscle Spasm:No complaints today.  Continue to monitor.  06/16/2024. 10. Neuropathic Pain: Continue Gabapentin . Continue to Monitor.  06/16/2024  11. Right elbow Pain: No complaints today. Continue current medication regimen. Continue to monitor. 06/16/2024 12. Right Knee with OA:. Continue HEP as Tolerated. Continue current medication regimen. Continue to monitor. 06/16/2024  13. Right Hip Pain: No complaints today. Continue with HEP as Tolerated. Continue to Monitor. 11/ 14/ 2025   F/U in 1 Month

## 2024-07-18 ENCOUNTER — Encounter: Attending: Physical Medicine & Rehabilitation | Admitting: Registered Nurse

## 2024-07-18 ENCOUNTER — Encounter: Payer: Self-pay | Admitting: Registered Nurse

## 2024-07-18 VITALS — BP 123/74 | HR 78 | Ht 66.0 in | Wt 161.0 lb

## 2024-07-18 DIAGNOSIS — Z79891 Long term (current) use of opiate analgesic: Secondary | ICD-10-CM

## 2024-07-18 DIAGNOSIS — G894 Chronic pain syndrome: Secondary | ICD-10-CM

## 2024-07-18 DIAGNOSIS — M542 Cervicalgia: Secondary | ICD-10-CM

## 2024-07-18 DIAGNOSIS — M25512 Pain in left shoulder: Secondary | ICD-10-CM | POA: Insufficient documentation

## 2024-07-18 DIAGNOSIS — M25511 Pain in right shoulder: Secondary | ICD-10-CM

## 2024-07-18 DIAGNOSIS — M797 Fibromyalgia: Secondary | ICD-10-CM | POA: Diagnosis not present

## 2024-07-18 DIAGNOSIS — Z5181 Encounter for therapeutic drug level monitoring: Secondary | ICD-10-CM

## 2024-07-18 DIAGNOSIS — M5412 Radiculopathy, cervical region: Secondary | ICD-10-CM

## 2024-07-18 DIAGNOSIS — M792 Neuralgia and neuritis, unspecified: Secondary | ICD-10-CM

## 2024-07-18 DIAGNOSIS — M546 Pain in thoracic spine: Secondary | ICD-10-CM | POA: Diagnosis not present

## 2024-07-18 DIAGNOSIS — G8929 Other chronic pain: Secondary | ICD-10-CM

## 2024-07-18 DIAGNOSIS — G5793 Unspecified mononeuropathy of bilateral lower limbs: Secondary | ICD-10-CM | POA: Insufficient documentation

## 2024-07-18 DIAGNOSIS — M5416 Radiculopathy, lumbar region: Secondary | ICD-10-CM | POA: Diagnosis not present

## 2024-07-18 DIAGNOSIS — M2550 Pain in unspecified joint: Secondary | ICD-10-CM | POA: Diagnosis not present

## 2024-07-18 MED ORDER — HYDROCODONE-ACETAMINOPHEN 7.5-325 MG PO TABS: 2.0000 | ORAL_TABLET | Freq: Three times a day (TID) | ORAL | 0 refills | Status: DC | PRN

## 2024-07-18 NOTE — Progress Notes (Signed)
 "  Subjective:    Patient ID: [name], female    DOB: [birth year], 48 y.o.   MRN: [id number]  HPI: [name] is a 48 y.o. female who is scheduled for Virtual visit , her husband was unable to bring her due to his back pain. [name] reports she has a Migraine today, her appointment was changed to Virtual visit. I connected with [name] visit and verified that I am speaking with the correct person using two identifiers.  Location: Patient: In her home  Provider: In the office   I discussed the limitations, risks, security and privacy concerns of performing an evaluation and management service by telephone and the availability of in person appointments. I also discussed with the patient that there may be a patient responsible charge related to this service. The patient expressed understanding and agreed to proceed.  She  states her pain is located in her neck radiating into her bilateral shoulders, mid- lower back radiating into her right buttock. She rates her pain 6. Her current exercise regime is walking and performing stretching exercises.  [name] Morphine equivalent is 45.00/ MME. She is also prescribed Clonazepam  by Dr. Bertell .We have discussed the black box warning of using opioids and benzodiazepines. I highlighted the dangers of using these drugs together and discussed the adverse events including respiratory suppression, overdose, cognitive impairment and importance of compliance with current regimen. We will continue to monitor and adjust as indicated.     Last Oral Swab was Performed on 03/14/2024, it was consistent.     Pain Inventory Average Pain 7 Pain Right Now 6 My pain is intermittent, sharp, burning, dull, stabbing, tingling, and aching  In the last 24 hours, has pain interfered with the following? General activity 8 Relation with others 8 Enjoyment of life 7 What TIME of day is your pain at its worst? morning , evening, and night Sleep  (in general) fair to poor  Pain is worse with: walking, bending, some activites, and lifting objects Pain improves with: therapy/exercise and medication Relief from Meds: 7  Family History  Problem Relation Age of Onset   Diabetes type II Mother    Colon polyps Mother    Diverticulitis Mother    Diabetes Mother    Heart disease Mother    Osteoporosis Father    Diabetes type II Brother    Uterine cancer Other    Ovarian cancer Other    COPD Paternal Grandfather    Heart attack Maternal Grandmother    Social History   Socioeconomic History   Marital status: Married    Spouse name: Not on file   Number of children: 2   Years of education: Not on file   Highest education level: Not on file  Occupational History   Occupation: Disability    Employer: UNEMPLOYED  Tobacco Use   Smoking status: Former    Current packs/day: 0.00    Types: Cigarettes    Quit date: 06/30/2011    Years since quitting: 13.0   Smokeless tobacco: Never  Vaping Use   Vaping status: Some Days   Substances: Nicotine, Flavoring  Substance and Sexual Activity   Alcohol  use: No    Alcohol /week: 0.0 standard drinks of alcohol    Drug use: No   Sexual activity: Yes  Other Topics Concern   Not on file  Social History Narrative   Not on file   Social Drivers of Health   Tobacco Use: Medium  Risk (06/16/2024)   Patient History    Smoking Tobacco Use: Former    Smokeless Tobacco Use: Never    Passive Exposure: Not on file  Financial Resource Strain: High Risk (06/10/2023)   Overall Financial Resource Strain (CARDIA)    Difficulty of Paying Living Expenses: Very hard  Food Insecurity: Food Insecurity Present (06/10/2023)   Hunger Vital Sign    Worried About Running Out of Food in the Last Year: Sometimes true    Ran Out of Food in the Last Year: Never true  Transportation Needs: No Transportation Needs (06/10/2023)   PRAPARE - Administrator, Civil Service (Medical): No    Lack of  Transportation (Non-Medical): No  Physical Activity: Inactive (06/10/2023)   Exercise Vital Sign    Days of Exercise per Week: 7 days    Minutes of Exercise per Session: 0 min  Stress: Stress Concern Present (06/10/2023)   [employer] of Occupational Health - Occupational Stress Questionnaire    Feeling of Stress : Rather much  Social Connections: Moderately Isolated (06/10/2023)   Social Connection and Isolation Panel    Frequency of Communication with Friends and Family: Three times a week    Frequency of Social Gatherings with Friends and Family: Once a week    Attends Religious Services: Never    Database Administrator or Organizations: No    Attends Banker Meetings: Never    Marital Status: Married  Depression (PHQ2-9): Low Risk (06/16/2024)   Depression (PHQ2-9)    PHQ-2 Score: 0  Alcohol  Screen: Low Risk (06/10/2023)   Alcohol  Screen    Last Alcohol  Screening Score (AUDIT): 0  Housing: Medium Risk (06/10/2023)   Housing    Last Housing Risk Score: 1  Utilities: Not At Risk (06/10/2023)   AHC Utilities    Threatened with loss of utilities: No  Health Literacy: Adequate Health Literacy (06/10/2023)   B1300 Health Literacy    Frequency of need for help with medical instructions: Never   Past Surgical History:  Procedure Laterality Date   BALLOON DILATION N/A 08/30/2023   Procedure: BALLOON DILATION;  Surgeon: Cindie Carlin POUR, DO;  [facility];  Service: Endoscopy;  Laterality: N/A;   BIOPSY  08/30/2023   Procedure: BIOPSY;  Surgeon: Cindie Carlin POUR, DO;  [facility];  Service: Endoscopy;;   BREAST BIOPSY Right 12/31/2016   FIBROCYSTIC CHANGES WITH CALCIFICATIONS/PSEUDOANGIOMATOUS STROMAL HYPERPLASIA (PASH)   CESAREAN SECTION     COLONOSCOPY WITH PROPOFOL  N/A 08/30/2023   Procedure: COLONOSCOPY WITH PROPOFOL ;  Surgeon: Cindie Carlin POUR, DO;  [facility];  Service: Endoscopy;  Laterality: N/A;  800am, asa 3    ESOPHAGOGASTRODUODENOSCOPY (EGD) WITH PROPOFOL  N/A 08/30/2023   Procedure: ESOPHAGOGASTRODUODENOSCOPY (EGD) WITH PROPOFOL ;  Surgeon: Cindie Carlin POUR, DO;  [facility];  Service: Endoscopy;  Laterality: N/A;   Left elbow surgery     PARTIAL HYSTERECTOMY     POLYPECTOMY  08/30/2023   Procedure: POLYPECTOMY;  Surgeon: Cindie Carlin POUR, DO;  [facility];  Service: Endoscopy;;   TONSILLECTOMY     Past Surgical History:  Procedure Laterality Date   BALLOON DILATION N/A 08/30/2023   Procedure: BALLOON DILATION;  Surgeon: Cindie Carlin POUR, DO;  [facility];  Service: Endoscopy;  Laterality: N/A;   BIOPSY  08/30/2023   Procedure: BIOPSY;  Surgeon: Cindie Carlin POUR, DO;  [facility];  Service: Endoscopy;;   BREAST BIOPSY Right 12/31/2016  FIBROCYSTIC CHANGES WITH CALCIFICATIONS/PSEUDOANGIOMATOUS STROMAL HYPERPLASIA (PASH)   CESAREAN SECTION     COLONOSCOPY WITH PROPOFOL  N/A 08/30/2023   Procedure: COLONOSCOPY WITH PROPOFOL ;  Surgeon: Cindie Carlin POUR, DO;  [facility];  Service: Endoscopy;  Laterality: N/A;  800am, asa 3   ESOPHAGOGASTRODUODENOSCOPY (EGD) WITH PROPOFOL  N/A 08/30/2023   Procedure: ESOPHAGOGASTRODUODENOSCOPY (EGD) WITH PROPOFOL ;  Surgeon: Cindie Carlin POUR, DO;  [facility];  Service: Endoscopy;  Laterality: N/A;   Left elbow surgery     PARTIAL HYSTERECTOMY     POLYPECTOMY  08/30/2023   Procedure: POLYPECTOMY;  Surgeon: Cindie Carlin POUR, DO;  [facility];  Service: Endoscopy;;   TONSILLECTOMY     Past Medical History:  Diagnosis Date   Anxiety    Chronic pain    Dr. Carilyn   Degenerative disc disease    Spinal, small syrinx   Depression    Esophageal stricture    Status post dilatation   Fibromyalgia    GERD (gastroesophageal reflux disease)    Hiatal hernia    Internal hemorrhoids without mention of complication    Irritable bowel syndrome    Palpitations    No documented  arrhythmias   Personal history of colonic polyps 10/04/2000   HYPERPLASTIC POLYP   POTS (postural orthostatic tachycardia syndrome)    Pulmonary embolus (HCC) 05/04/2023   Syncope    Negative tilt table test   Syringomyelia and syringobulbia (HCC)    There were no vitals taken for this visit.  Opioid Risk Score:   Fall Risk Score:  `1  Depression screen [facility] 2/9     06/16/2024    3:08 PM 03/14/2024    3:03 PM 01/25/2024    3:19 PM 12/30/2023    3:29 PM 11/30/2023    2:11 PM 10/15/2023    2:18 PM 09/03/2023    2:47 PM  Depression screen PHQ 2/9  Decreased Interest 0 0 0 1 1 1  0  Down, Depressed, Hopeless 0 0 0 1 1 1  0  PHQ - 2 Score 0 0 0 2 2 2  0  Altered sleeping   0 2     Tired, decreased energy   0 3     Change in appetite   0 0     Feeling bad or failure about yourself    0 3     Trouble concentrating   0 1     Moving slowly or fidgety/restless   0 0     Suicidal thoughts   0 0     PHQ-9 Score   0  11      Difficult doing work/chores   Somewhat difficult Not difficult at all        Data saved with a previous flowsheet row definition    Review of Systems  Musculoskeletal:  Positive for back pain and neck pain.       Right hip, right buttock & down right leg, pain in both shoulders  Neurological:  Positive for headaches.  All other systems reviewed and are negative.      Objective:   Physical Exam Vitals and nursing note reviewed.  Musculoskeletal:     Comments: No Physical Exam: Virtual Visit           Assessment & Plan:  1. Right Lumbar Radiculitis/ Lumbar degenerative disc disease: 07/18/2024. Continue with current treatment regimen: Refilled : HYDROcodone  7.5/325 mg two tablets every 8 hours as needed #180. Second script sent for the following  month to accommodate scheduled appointment. We will continue the opioid monitoring program, this consists of regular clinic visits, examinations, urine drug screen, pill counts as well as use of [place]   Controlled Substance Reporting system. A 12 month History has been reviewed on the   Controlled Substance Reporting System on 07/18/2024 2. Thoracic Spondylosis: Continue current treatment with HEP and  current medication regime. 07/18/2024. 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 Continue to Monitor. 07/18/2024.  Lyrica  discontinued due to adverse effects. Continue with exercise and heat therapy. 07/18/2024 4. Fibromyalgia:Continue with Heat and  exercise Regimen. Continue Gabapentin . 07/18/2024  5.Depression and anxiety: PCP Following. 07/18/2024 6. Orthostatic hypotension: Cardiology Following. 07/18/2024 7. Polyarthralgia: Continue HEP as tolerated . Continue to monitor. 07/18/2024 8. Chronic Bilateral  Shoulder Pain: Continue current medication regime. Continue HEP as Tolerated. 12/16//2025. 9. Muscle Spasm:No complaints today.  Continue to monitor.  07/18/2024. 10. Neuropathic Pain: Continue Gabapentin . Continue to Monitor.  07/18/2024  11. Right elbow Pain: No complaints today. Continue current medication regimen. Continue to monitor. 07/18/2024 12. Right Knee with OA:. No Complaints today. Continue HEP as Tolerated. Continue current medication regimen. Continue to monitor. 07/18/2024  13. Right Hip Pain: No complaints today. Continue with HEP as Tolerated. Continue to Monitor. 12/ 16/ 2025   F/U in 1 Month  Virtual Visit  Established Patient Location of Patient: In Her Home  Location of Provider: In the Office  [name]: 100 %v Shea Visit  Provider: Video Not working: 100 % Audio Visit       "

## 2024-08-31 ENCOUNTER — Other Ambulatory Visit: Payer: Self-pay | Admitting: Registered Nurse

## 2024-09-01 ENCOUNTER — Encounter: Attending: Physical Medicine & Rehabilitation | Admitting: Registered Nurse

## 2024-09-01 ENCOUNTER — Encounter: Payer: Self-pay | Admitting: Registered Nurse

## 2024-09-01 VITALS — BP 114/77 | HR 80 | Ht 66.0 in | Wt 166.0 lb

## 2024-09-01 DIAGNOSIS — M25511 Pain in right shoulder: Secondary | ICD-10-CM | POA: Diagnosis present

## 2024-09-01 DIAGNOSIS — M542 Cervicalgia: Secondary | ICD-10-CM | POA: Insufficient documentation

## 2024-09-01 DIAGNOSIS — G5793 Unspecified mononeuropathy of bilateral lower limbs: Secondary | ICD-10-CM | POA: Insufficient documentation

## 2024-09-01 DIAGNOSIS — M546 Pain in thoracic spine: Secondary | ICD-10-CM | POA: Diagnosis present

## 2024-09-01 DIAGNOSIS — M792 Neuralgia and neuritis, unspecified: Secondary | ICD-10-CM | POA: Diagnosis present

## 2024-09-01 DIAGNOSIS — M25512 Pain in left shoulder: Secondary | ICD-10-CM | POA: Insufficient documentation

## 2024-09-01 DIAGNOSIS — M5412 Radiculopathy, cervical region: Secondary | ICD-10-CM | POA: Insufficient documentation

## 2024-09-01 DIAGNOSIS — M797 Fibromyalgia: Secondary | ICD-10-CM | POA: Insufficient documentation

## 2024-09-01 DIAGNOSIS — M545 Low back pain, unspecified: Secondary | ICD-10-CM | POA: Insufficient documentation

## 2024-09-01 DIAGNOSIS — M2550 Pain in unspecified joint: Secondary | ICD-10-CM | POA: Diagnosis present

## 2024-09-01 DIAGNOSIS — G894 Chronic pain syndrome: Secondary | ICD-10-CM | POA: Insufficient documentation

## 2024-09-01 DIAGNOSIS — G8929 Other chronic pain: Secondary | ICD-10-CM | POA: Insufficient documentation

## 2024-09-01 MED ORDER — GABAPENTIN 100 MG PO CAPS: 100.0000 mg | ORAL_CAPSULE | Freq: Four times a day (QID) | ORAL | 5 refills | Status: AC

## 2024-09-01 MED ORDER — HYDROCODONE-ACETAMINOPHEN 7.5-325 MG PO TABS: 2.0000 | ORAL_TABLET | Freq: Three times a day (TID) | ORAL | 0 refills | Status: AC | PRN

## 2024-09-01 NOTE — Progress Notes (Signed)
 "  Subjective:    Patient ID: [name], female    DOB: [birth year], 49 y.o.   MRN: [id number]  HPI: [name] is a 49 y.o. female who returns for follow up appointment for chronic pain and medication refill. She states her pain is located in her neck radiating into her bilateral shoulders, mid- lower back and generalized joint pain. She also reports neuropathic pain in her bilateral hands and bilateral feet tingling and burning.  She rates her pain 6. Her current exercise regime is walking and performing stretching exercises.  [name].   She is also prescribed Clonazepam  by Camie Lunger  .We have discussed the black box warning of using opioids and benzodiazepines. I highlighted the dangers of using these drugs together and discussed the adverse events including respiratory suppression, overdose, cognitive impairment and importance of compliance with current regimen. We will continue to monitor and adjust as indicated.  She verbalizes understanding.     Pain Inventory Average Pain 6-7 Pain Right Now 6 My pain is intermittent, sharp, burning, dull, stabbing, tingling, and aching  In the last 24 hours, has pain interfered with the following? General activity 7 Relation with others 7 Enjoyment of life 7 What TIME of day is your pain at its worst? morning , evening, and night Sleep (in general) fair to poor  Pain is worse with: walking, bending, sitting, inactivity, standing, and some activites Pain improves with: rest, heat/ice, medication, and injections Relief from Meds: 6  Family History  Problem Relation Age of Onset   Diabetes type II Mother    Colon polyps Mother    Diverticulitis Mother    Diabetes Mother    Heart disease Mother    Osteoporosis Father    Diabetes type II Brother    Uterine cancer Other    Ovarian cancer Other    COPD Paternal Grandfather    Heart attack Maternal Grandmother    Social History   Socioeconomic  History   Marital status: Married    Spouse name: Not on file   Number of children: 2   Years of education: Not on file   Highest education level: Not on file  Occupational History   Occupation: Disability    Employer: UNEMPLOYED  Tobacco Use   Smoking status: Former    Current packs/day: 0.00    Types: Cigarettes    Quit date: 06/30/2011    Years since quitting: 13.1   Smokeless tobacco: Never  Vaping Use   Vaping status: Some Days   Substances: Nicotine, Flavoring  Substance and Sexual Activity   Alcohol  use: No    Alcohol /week: 0.0 standard drinks of alcohol    Drug use: No   Sexual activity: Yes  Other Topics Concern   Not on file  Social History Narrative   Not on file   Social Drivers of Health   Tobacco Use: Medium Risk (09/01/2024)   Patient History    Smoking Tobacco Use: Former    Smokeless Tobacco Use: Never    Passive Exposure: Not on file  Financial Resource Strain: High Risk (06/10/2023)   Overall Financial Resource Strain (CARDIA)    Difficulty of Paying Living Expenses: Very hard  Food Insecurity: Food Insecurity Present (06/10/2023)   Hunger Vital Sign    Worried About Running Out of Food in the Last Year: Sometimes true    Ran Out of Food in the Last Year: Never true  Transportation Needs: No Transportation Needs (  06/10/2023)   PRAPARE - Administrator, Civil Service (Medical): No    Lack of Transportation (Non-Medical): No  Physical Activity: Inactive (06/10/2023)   Exercise Vital Sign    Days of Exercise per Week: 7 days    Minutes of Exercise per Session: 0 min  Stress: Stress Concern Present (06/10/2023)   [employer] of Occupational Health - Occupational Stress Questionnaire    Feeling of Stress : Rather much  Social Connections: Moderately Isolated (06/10/2023)   Social Connection and Isolation Panel    Frequency of Communication with Friends and Family: Three times a week    Frequency of Social Gatherings with Friends and  Family: Once a week    Attends Religious Services: Never    Database Administrator or Organizations: No    Attends Banker Meetings: Never    Marital Status: Married  Depression (PHQ2-9): Low Risk (07/18/2024)   Depression (PHQ2-9)    PHQ-2 Score: 2  Alcohol  Screen: Low Risk (06/10/2023)   Alcohol  Screen    Last Alcohol  Screening Score (AUDIT): 0  Housing: Medium Risk (06/10/2023)   Housing    Last Housing Risk Score: 1  Utilities: Not At Risk (06/10/2023)   AHC Utilities    Threatened with loss of utilities: No  Health Literacy: Adequate Health Literacy (06/10/2023)   B1300 Health Literacy    Frequency of need for help with medical instructions: Never   Past Surgical History:  Procedure Laterality Date   BALLOON DILATION N/A 08/30/2023   Procedure: BALLOON DILATION;  Surgeon: Cindie Carlin POUR, DO;  [facility];  Service: Endoscopy;  Laterality: N/A;   BIOPSY  08/30/2023   Procedure: BIOPSY;  Surgeon: Cindie Carlin POUR, DO;  [facility];  Service: Endoscopy;;   BREAST BIOPSY Right 12/31/2016   FIBROCYSTIC CHANGES WITH CALCIFICATIONS/PSEUDOANGIOMATOUS STROMAL HYPERPLASIA (PASH)   CESAREAN SECTION     COLONOSCOPY WITH PROPOFOL  N/A 08/30/2023   Procedure: COLONOSCOPY WITH PROPOFOL ;  Surgeon: Cindie Carlin POUR, DO;  [facility];  Service: Endoscopy;  Laterality: N/A;  800am, asa 3   ESOPHAGOGASTRODUODENOSCOPY (EGD) WITH PROPOFOL  N/A 08/30/2023   Procedure: ESOPHAGOGASTRODUODENOSCOPY (EGD) WITH PROPOFOL ;  Surgeon: Cindie Carlin POUR, DO;  [facility];  Service: Endoscopy;  Laterality: N/A;   Left elbow surgery     PARTIAL HYSTERECTOMY     POLYPECTOMY  08/30/2023   Procedure: POLYPECTOMY;  Surgeon: Cindie Carlin POUR, DO;  [facility];  Service: Endoscopy;;   TONSILLECTOMY     Past Surgical History:  Procedure Laterality Date   BALLOON DILATION N/A 08/30/2023   Procedure: BALLOON DILATION;  Surgeon: Cindie Carlin POUR,  DO;  [facility];  Service: Endoscopy;  Laterality: N/A;   BIOPSY  08/30/2023   Procedure: BIOPSY;  Surgeon: Cindie Carlin POUR, DO;  [facility];  Service: Endoscopy;;   BREAST BIOPSY Right 12/31/2016   FIBROCYSTIC CHANGES WITH CALCIFICATIONS/PSEUDOANGIOMATOUS STROMAL HYPERPLASIA (PASH)   CESAREAN SECTION     COLONOSCOPY WITH PROPOFOL  N/A 08/30/2023   Procedure: COLONOSCOPY WITH PROPOFOL ;  Surgeon: Cindie Carlin POUR, DO;  [facility];  Service: Endoscopy;  Laterality: N/A;  800am, asa 3   ESOPHAGOGASTRODUODENOSCOPY (EGD) WITH PROPOFOL  N/A 08/30/2023   Procedure: ESOPHAGOGASTRODUODENOSCOPY (EGD) WITH PROPOFOL ;  Surgeon: Cindie Carlin POUR, DO;  [facility];  Service: Endoscopy;  Laterality: N/A;   Left elbow surgery     PARTIAL HYSTERECTOMY     POLYPECTOMY  08/30/2023  Procedure: POLYPECTOMY;  Surgeon: Cindie Carlin POUR, DO;  [facility];  Service: Endoscopy;;   TONSILLECTOMY     Past Medical History:  Diagnosis Date   Anxiety    Chronic pain    Dr. Carilyn   Degenerative disc disease    Spinal, small syrinx   Depression    Esophageal stricture    Status post dilatation   Fibromyalgia    GERD (gastroesophageal reflux disease)    Hiatal hernia    Internal hemorrhoids without mention of complication    Irritable bowel syndrome    Palpitations    No documented arrhythmias   Personal history of colonic polyps 10/04/2000   HYPERPLASTIC POLYP   POTS (postural orthostatic tachycardia syndrome)    Pulmonary embolus (HCC) 05/04/2023   Syncope    Negative tilt table test   Syringomyelia and syringobulbia (HCC)    BP 114/77 (BP Location: Right Arm, Patient Position: Sitting, Cuff Size: Normal)   Pulse 80   Ht 5' 6 (1.676 m)   Wt 166 lb (75.3 kg)   SpO2 94%   BMI 26.79 kg/m   Opioid Risk Score:   Fall Risk Score:  `1  Depression screen [facility] 2/9     07/18/2024    3:25 PM 06/16/2024    3:08 PM 03/14/2024    3:03 PM  01/25/2024    3:19 PM 12/30/2023    3:29 PM 11/30/2023    2:11 PM 10/15/2023    2:18 PM  Depression screen PHQ 2/9  Decreased Interest 1 0 0 0 1 1 1   Down, Depressed, Hopeless 1 0 0 0 1 1 1   PHQ - 2 Score 2 0 0 0 2 2 2   Altered sleeping    0 2    Tired, decreased energy    0 3    Change in appetite    0 0    Feeling bad or failure about yourself     0 3    Trouble concentrating    0 1    Moving slowly or fidgety/restless    0 0    Suicidal thoughts    0 0    PHQ-9 Score    0  11     Difficult doing work/chores    Somewhat difficult Not difficult at all       Data saved with a previous flowsheet row definition      Review of Systems  Musculoskeletal:  Positive for back pain and neck pain.       Headaches, neck pain radiating down into bilateral shoulders, mid to lower back pain, right hip pain, right ram to finger pain, bilateral foot pain  Neurological:  Positive for headaches.  All other systems reviewed and are negative.      Objective:   Physical Exam Vitals and nursing note reviewed.  Constitutional:      Appearance: Normal appearance.  Neck:     Comments: Cervical {Paraspinal Tenderness: C-5-C-6 Cardiovascular:     Rate and Rhythm: Normal rate and regular rhythm.     Pulses: Normal pulses.     Heart sounds: Normal heart sounds.  Pulmonary:     Effort: Pulmonary effort is normal.     Breath sounds: Normal breath sounds.  Musculoskeletal:     Comments: Normal Muscle Bulk and Muscle Testing Reveals:  Upper Extremities: Full ROM and Muscle Strength 5/5 Bilateral AC Joint Tenderness Lumbar Paraspinal Tenderness: L-3-L-5 Lower Extremities: Full ROM and Muscle Strength 5/5 Arises from Table  with ease Narrow Based  Gait     Skin:    General: Skin is warm and dry.  Neurological:     Mental Status: She is alert and oriented to person, place, and time.  Psychiatric:        Mood and Affect: Mood normal.        Behavior: Behavior normal.          Assessment &  Plan:  1. Right Lumbar Radiculitis/ Lumbar degenerative disc disease: 09/01/2024. Continue with current treatment regimen: Refilled : HYDROcodone  7.5/325 mg two tablets every 8 hours as needed #180. SABRA We will continue the opioid monitoring program, this consists of regular clinic visits, examinations, urine drug screen, pill counts as well as use of [place]  Controlled Substance Reporting system. A 12 month History has been reviewed on the [place]  Controlled Substance Reporting System on 09/01/2024 2. Thoracic Spondylosis: Continue current treatment with HEP and  current medication regime. 09/01/2024. 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 Continue to Monitor. 09/01/2024.  Lyrica  discontinued due to adverse effects. Continue with exercise and heat therapy. 09/01/2024 4. Fibromyalgia:Continue with Heat and  exercise Regimen. Continue Gabapentin . 09/01/2024  5.Depression and anxiety: PCP Following. 09/01/2024 6. Orthostatic hypotension: Cardiology Following. 07/18/2024 7. Polyarthralgia: Continue HEP as tolerated . Continue to monitor. 09/01/2024 8. Chronic Bilateral  Shoulder Pain: Continue current medication regime. Continue HEP as Tolerated. 01/30//2026. 9. Muscle Spasm:No complaints today.  Continue to monitor.  09/01/2024. 10. Neuropathic Pain: Continue Gabapentin . Continue to Monitor.  09/01/2024  11. Right elbow Pain: No complaints today. Continue current medication regimen. Continue to monitor. 09/01/2024 12. Right Knee with OA:. No Complaints today. Continue HEP as Tolerated. Continue current medication regimen. Continue to monitor. 09/01/2024  13. Right Hip Pain: No complaints today. Continue with HEP as Tolerated. Continue to Monitor. 01/ 30/ 2026   F/U in 2 Months   "

## 2024-09-05 ENCOUNTER — Other Ambulatory Visit: Payer: Self-pay | Admitting: Registered Nurse

## 2024-09-05 DIAGNOSIS — G8929 Other chronic pain: Secondary | ICD-10-CM

## 2024-09-05 DIAGNOSIS — G894 Chronic pain syndrome: Secondary | ICD-10-CM

## 2024-09-05 DIAGNOSIS — M542 Cervicalgia: Secondary | ICD-10-CM

## 2024-10-03 ENCOUNTER — Encounter: Admitting: Registered Nurse
